# Patient Record
Sex: Female | Born: 1945
Health system: Southern US, Community
[De-identification: ages and names within clinical notes are randomized; demographics above are authoritative.]

## PROBLEM LIST (undated history)

## (undated) DIAGNOSIS — D649 Anemia, unspecified: Secondary | ICD-10-CM

## (undated) DIAGNOSIS — E785 Hyperlipidemia, unspecified: Secondary | ICD-10-CM

## (undated) DIAGNOSIS — Z5189 Encounter for other specified aftercare: Secondary | ICD-10-CM

## (undated) DIAGNOSIS — A809 Acute poliomyelitis, unspecified: Secondary | ICD-10-CM

## (undated) DIAGNOSIS — C801 Malignant (primary) neoplasm, unspecified: Secondary | ICD-10-CM

## (undated) DIAGNOSIS — J40 Bronchitis, not specified as acute or chronic: Secondary | ICD-10-CM

## (undated) DIAGNOSIS — H269 Unspecified cataract: Secondary | ICD-10-CM

## (undated) DIAGNOSIS — I10 Essential (primary) hypertension: Secondary | ICD-10-CM

## (undated) DIAGNOSIS — IMO0001 Reserved for inherently not codable concepts without codable children: Secondary | ICD-10-CM

## (undated) DIAGNOSIS — I639 Cerebral infarction, unspecified: Secondary | ICD-10-CM

## (undated) DIAGNOSIS — Z87442 Personal history of urinary calculi: Secondary | ICD-10-CM

## (undated) DIAGNOSIS — I729 Aneurysm of unspecified site: Secondary | ICD-10-CM

## (undated) DIAGNOSIS — E7800 Pure hypercholesterolemia, unspecified: Secondary | ICD-10-CM

## (undated) DIAGNOSIS — N181 Chronic kidney disease, stage 1: Secondary | ICD-10-CM

## (undated) DIAGNOSIS — R42 Dizziness and giddiness: Secondary | ICD-10-CM

## (undated) DIAGNOSIS — Z8719 Personal history of other diseases of the digestive system: Secondary | ICD-10-CM

## (undated) DIAGNOSIS — E119 Type 2 diabetes mellitus without complications: Secondary | ICD-10-CM

## (undated) DIAGNOSIS — R0600 Dyspnea, unspecified: Secondary | ICD-10-CM

## (undated) DIAGNOSIS — I499 Cardiac arrhythmia, unspecified: Secondary | ICD-10-CM

## (undated) DIAGNOSIS — K219 Gastro-esophageal reflux disease without esophagitis: Secondary | ICD-10-CM

## (undated) HISTORY — PX: CORONARY ANGIOPLASTY: SHX604

## (undated) HISTORY — PX: CAROTID ENDARTERECTOMY: SUR193

## (undated) HISTORY — DX: Type 2 diabetes mellitus without complications: E11.9

## (undated) HISTORY — PX: DILATION AND CURETTAGE OF UTERUS: SHX78

## (undated) HISTORY — DX: Unspecified cataract: H26.9

## (undated) HISTORY — DX: Aneurysm of unspecified site: I72.9

---
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
## 1983-04-15 DIAGNOSIS — C801 Malignant (primary) neoplasm, unspecified: Secondary | ICD-10-CM

## 1983-04-15 HISTORY — DX: Malignant (primary) neoplasm, unspecified: C80.1

## 1983-04-15 HISTORY — PX: CERVICAL CONIZATION W/BX: SHX1330

## 1998-08-07 ENCOUNTER — Ambulatory Visit (HOSPITAL_COMMUNITY): Admission: RE | Admit: 1998-08-07 | Discharge: 1998-08-07 | Payer: Self-pay | Admitting: *Deleted

## 1998-08-07 ENCOUNTER — Encounter: Payer: Self-pay | Admitting: *Deleted

## 1998-08-21 ENCOUNTER — Encounter: Payer: Self-pay | Admitting: *Deleted

## 1998-08-24 ENCOUNTER — Inpatient Hospital Stay (HOSPITAL_COMMUNITY): Admission: RE | Admit: 1998-08-24 | Discharge: 1998-08-27 | Payer: Self-pay | Admitting: *Deleted

## 2005-05-03 ENCOUNTER — Inpatient Hospital Stay (HOSPITAL_COMMUNITY): Admission: EM | Admit: 2005-05-03 | Discharge: 2005-05-08 | Payer: Self-pay | Admitting: Emergency Medicine

## 2005-06-23 ENCOUNTER — Ambulatory Visit (HOSPITAL_COMMUNITY): Admission: RE | Admit: 2005-06-23 | Discharge: 2005-06-23 | Payer: Self-pay | Admitting: Interventional Radiology

## 2005-08-27 ENCOUNTER — Encounter: Payer: Self-pay | Admitting: Interventional Radiology

## 2005-10-22 ENCOUNTER — Inpatient Hospital Stay (HOSPITAL_COMMUNITY): Admission: RE | Admit: 2005-10-22 | Discharge: 2005-10-23 | Payer: Self-pay | Admitting: Interventional Radiology

## 2005-11-03 ENCOUNTER — Encounter: Admission: RE | Admit: 2005-11-03 | Discharge: 2005-11-03 | Payer: Self-pay | Admitting: Interventional Radiology

## 2005-11-05 ENCOUNTER — Encounter: Payer: Self-pay | Admitting: Interventional Radiology

## 2008-04-14 HISTORY — PX: COLONOSCOPY: SHX5424

## 2011-02-19 ENCOUNTER — Emergency Department (HOSPITAL_COMMUNITY): Payer: Medicare Other

## 2011-02-19 ENCOUNTER — Inpatient Hospital Stay (HOSPITAL_COMMUNITY)
Admission: EM | Admit: 2011-02-19 | Discharge: 2011-02-21 | DRG: 069 | Disposition: A | Discharge status: Home / Self Care | Payer: Medicare Other | Source: Ambulatory Visit | Attending: Family Medicine | Admitting: Family Medicine

## 2011-02-19 ENCOUNTER — Other Ambulatory Visit: Payer: Self-pay

## 2011-02-19 DIAGNOSIS — Z8673 Personal history of transient ischemic attack (TIA), and cerebral infarction without residual deficits: Secondary | ICD-10-CM

## 2011-02-19 DIAGNOSIS — J40 Bronchitis, not specified as acute or chronic: Secondary | ICD-10-CM

## 2011-02-19 DIAGNOSIS — D72829 Elevated white blood cell count, unspecified: Secondary | ICD-10-CM | POA: Diagnosis present

## 2011-02-19 DIAGNOSIS — F17200 Nicotine dependence, unspecified, uncomplicated: Secondary | ICD-10-CM | POA: Diagnosis present

## 2011-02-19 DIAGNOSIS — N058 Unspecified nephritic syndrome with other morphologic changes: Secondary | ICD-10-CM | POA: Diagnosis present

## 2011-02-19 DIAGNOSIS — Z9861 Coronary angioplasty status: Secondary | ICD-10-CM

## 2011-02-19 DIAGNOSIS — R29898 Other symptoms and signs involving the musculoskeletal system: Secondary | ICD-10-CM | POA: Diagnosis present

## 2011-02-19 DIAGNOSIS — B91 Sequelae of poliomyelitis: Secondary | ICD-10-CM

## 2011-02-19 DIAGNOSIS — M216X9 Other acquired deformities of unspecified foot: Secondary | ICD-10-CM | POA: Diagnosis present

## 2011-02-19 DIAGNOSIS — G459 Transient cerebral ischemic attack, unspecified: Principal | ICD-10-CM | POA: Diagnosis present

## 2011-02-19 DIAGNOSIS — E1129 Type 2 diabetes mellitus with other diabetic kidney complication: Secondary | ICD-10-CM | POA: Diagnosis present

## 2011-02-19 DIAGNOSIS — N390 Urinary tract infection, site not specified: Secondary | ICD-10-CM | POA: Diagnosis present

## 2011-02-19 DIAGNOSIS — Z720 Tobacco use: Secondary | ICD-10-CM | POA: Diagnosis present

## 2011-02-19 DIAGNOSIS — K219 Gastro-esophageal reflux disease without esophagitis: Secondary | ICD-10-CM | POA: Diagnosis present

## 2011-02-19 DIAGNOSIS — E876 Hypokalemia: Secondary | ICD-10-CM | POA: Diagnosis present

## 2011-02-19 DIAGNOSIS — I129 Hypertensive chronic kidney disease with stage 1 through stage 4 chronic kidney disease, or unspecified chronic kidney disease: Secondary | ICD-10-CM | POA: Diagnosis present

## 2011-02-19 DIAGNOSIS — I10 Essential (primary) hypertension: Secondary | ICD-10-CM | POA: Diagnosis present

## 2011-02-19 DIAGNOSIS — N181 Chronic kidney disease, stage 1: Secondary | ICD-10-CM | POA: Diagnosis present

## 2011-02-19 HISTORY — DX: Essential (primary) hypertension: I10

## 2011-02-19 HISTORY — DX: Reserved for inherently not codable concepts without codable children: IMO0001

## 2011-02-19 HISTORY — DX: Pure hypercholesterolemia, unspecified: E78.00

## 2011-02-19 HISTORY — DX: Gastro-esophageal reflux disease without esophagitis: K21.9

## 2011-02-19 HISTORY — DX: Encounter for other specified aftercare: Z51.89

## 2011-02-19 HISTORY — DX: Hyperlipidemia, unspecified: E78.5

## 2011-02-19 HISTORY — DX: Bronchitis, not specified as acute or chronic: J40

## 2011-02-19 HISTORY — DX: Malignant (primary) neoplasm, unspecified: C80.1

## 2011-02-19 HISTORY — DX: Cerebral infarction, unspecified: I63.9

## 2011-02-19 HISTORY — DX: Acute poliomyelitis, unspecified: A80.9

## 2011-02-19 HISTORY — DX: Anemia, unspecified: D64.9

## 2011-02-19 HISTORY — DX: Chronic kidney disease, stage 1: N18.1

## 2011-02-19 HISTORY — DX: Dizziness and giddiness: R42

## 2011-02-19 LAB — URINALYSIS, ROUTINE W REFLEX MICROSCOPIC
Glucose, UA: NEGATIVE mg/dL
Nitrite: NEGATIVE
Protein, ur: NEGATIVE mg/dL
Urobilinogen, UA: 0.2 mg/dL (ref 0.0–1.0)

## 2011-02-19 LAB — DIFFERENTIAL
Eosinophils Absolute: 0.2 10*3/uL (ref 0.0–0.7)
Lymphocytes Relative: 22 % (ref 12–46)
Lymphs Abs: 3 10*3/uL (ref 0.7–4.0)
Monocytes Absolute: 0.8 10*3/uL (ref 0.1–1.0)
Monocytes Relative: 6 % (ref 3–12)
Neutro Abs: 9.2 10*3/uL — ABNORMAL HIGH (ref 1.7–7.7)
Neutrophils Relative %: 70 % (ref 43–77)

## 2011-02-19 LAB — PROTIME-INR: INR: 0.99 (ref 0.00–1.49)

## 2011-02-19 LAB — BASIC METABOLIC PANEL
BUN: 16 mg/dL (ref 6–23)
CO2: 25 mEq/L (ref 19–32)
Chloride: 102 mEq/L (ref 96–112)
GFR calc Af Amer: 60 mL/min — ABNORMAL LOW (ref >90)
Glucose, Bld: 122 mg/dL — ABNORMAL HIGH (ref 70–99)
Sodium: 140 mEq/L (ref 135–145)

## 2011-02-19 LAB — URINE MICROSCOPIC-ADD ON

## 2011-02-19 LAB — CBC
MCHC: 34.1 g/dL (ref 30.0–36.0)
Platelets: 294 10*3/uL (ref 150–400)

## 2011-02-19 LAB — CARDIAC PANEL(CRET KIN+CKTOT+MB+TROPI)
CK, MB: 2.4 ng/mL (ref 0.3–4.0)
Total CK: 104 U/L (ref 7–177)
Troponin I: <0.3 ng/mL (ref <0.30)

## 2011-02-19 MED ORDER — ACETAMINOPHEN 650 MG RE SUPP: 650.0000 mg | RECTAL | Status: DC | PRN

## 2011-02-19 MED ORDER — CIPROFLOXACIN HCL 250 MG PO TABS
250.0000 mg | ORAL_TABLET | Freq: Two times a day (BID) | ORAL | Status: DC
  Administered 2011-02-20 – 2011-02-21 (×4): 250 mg via ORAL
  Filled 2011-02-19 (×7): qty 1

## 2011-02-19 MED ORDER — FAMOTIDINE 40 MG PO TABS
40.0000 mg | ORAL_TABLET | Freq: Two times a day (BID) | ORAL | Status: DC
  Administered 2011-02-20 – 2011-02-21 (×3): 40 mg via ORAL
  Filled 2011-02-19 (×5): qty 1

## 2011-02-19 MED ORDER — ALBUTEROL SULFATE (5 MG/ML) 0.5% IN NEBU
2.5000 mg | INHALATION_SOLUTION | RESPIRATORY_TRACT | Status: DC
  Filled 2011-02-19: qty 0.5

## 2011-02-19 MED ORDER — INSULIN ASPART 100 UNIT/ML ~~LOC~~ SOLN
0.0000 [IU] | Freq: Three times a day (TID) | SUBCUTANEOUS | Status: DC
  Filled 2011-02-19: qty 3

## 2011-02-19 MED ORDER — ALBUTEROL SULFATE (5 MG/ML) 0.5% IN NEBU
2.5000 mg | INHALATION_SOLUTION | RESPIRATORY_TRACT | Status: DC | PRN
  Administered 2011-02-20: 2.5 mg via RESPIRATORY_TRACT
  Filled 2011-02-19: qty 0.5

## 2011-02-19 MED ORDER — SODIUM CHLORIDE 0.9 % IV SOLN: 250.0000 mL | INTRAVENOUS | Status: DC

## 2011-02-19 MED ORDER — SODIUM CHLORIDE 0.9 % IJ SOLN
3.0000 mL | Freq: Two times a day (BID) | INTRAMUSCULAR | Status: DC
  Administered 2011-02-20 (×2): 3 mL via INTRAVENOUS

## 2011-02-19 MED ORDER — ASPIRIN 300 MG RE SUPP
300.0000 mg | Freq: Every day | RECTAL | Status: DC
Start: 2011-02-20 — End: 2011-02-20
  Filled 2011-02-19: qty 1

## 2011-02-19 MED ORDER — SIMVASTATIN 20 MG PO TABS
20.0000 mg | ORAL_TABLET | Freq: Every day | ORAL | Status: DC
  Administered 2011-02-20: 20 mg via ORAL
  Filled 2011-02-19 (×2): qty 1

## 2011-02-19 MED ORDER — GUAIFENESIN ER 600 MG PO TB12
600.0000 mg | ORAL_TABLET | Freq: Two times a day (BID) | ORAL | Status: DC
  Administered 2011-02-20 – 2011-02-21 (×4): 600 mg via ORAL
  Filled 2011-02-19 (×5): qty 1

## 2011-02-19 MED ORDER — ONDANSETRON HCL 4 MG/2ML IJ SOLN: 4.0000 mg | Freq: Four times a day (QID) | INTRAMUSCULAR | Status: DC | PRN

## 2011-02-19 MED ORDER — IPRATROPIUM BROMIDE 0.02 % IN SOLN
0.5000 mg | RESPIRATORY_TRACT | Status: AC
  Administered 2011-02-19: 0.5 mg via RESPIRATORY_TRACT

## 2011-02-19 MED ORDER — HYDROCHLOROTHIAZIDE 25 MG PO TABS
25.0000 mg | ORAL_TABLET | Freq: Every day | ORAL | Status: DC
  Administered 2011-02-20: 25 mg via ORAL
  Filled 2011-02-19: qty 1

## 2011-02-19 MED ORDER — ASPIRIN 325 MG PO TABS
325.0000 mg | ORAL_TABLET | Freq: Every day | ORAL | Status: DC
  Filled 2011-02-19: qty 1

## 2011-02-19 MED ORDER — POTASSIUM CHLORIDE 20 MEQ/15ML (10%) PO LIQD
40.0000 meq | Freq: Once | ORAL | Status: AC
  Administered 2011-02-19: 40 meq via ORAL
  Filled 2011-02-19: qty 30

## 2011-02-19 MED ORDER — ACETAMINOPHEN 325 MG PO TABS: 650.0000 mg | ORAL_TABLET | ORAL | Status: DC | PRN

## 2011-02-19 MED ORDER — SODIUM CHLORIDE 0.9 % IJ SOLN: 3.0000 mL | INTRAMUSCULAR | Status: DC | PRN

## 2011-02-19 MED ORDER — SENNOSIDES-DOCUSATE SODIUM 8.6-50 MG PO TABS
1.0000 | ORAL_TABLET | Freq: Every evening | ORAL | Status: DC | PRN
  Filled 2011-02-19: qty 1

## 2011-02-19 MED ORDER — IPRATROPIUM BROMIDE 0.02 % IN SOLN
0.5000 mg | RESPIRATORY_TRACT | Status: DC
  Filled 2011-02-19: qty 2.5

## 2011-02-19 MED ORDER — ALBUTEROL SULFATE (5 MG/ML) 0.5% IN NEBU
2.5000 mg | INHALATION_SOLUTION | RESPIRATORY_TRACT | Status: AC
  Administered 2011-02-19: 2.5 mg via RESPIRATORY_TRACT

## 2011-02-19 MED ORDER — DOXAZOSIN MESYLATE 8 MG PO TABS
4.0000 mg | ORAL_TABLET | Freq: Every day | ORAL | Status: DC
  Administered 2011-02-20: 4 mg via ORAL
  Filled 2011-02-19 (×2): qty 0.5

## 2011-02-19 MED ORDER — AMLODIPINE BESYLATE 5 MG PO TABS
5.0000 mg | ORAL_TABLET | Freq: Every day | ORAL | Status: DC
  Administered 2011-02-20: 5 mg via ORAL
  Filled 2011-02-19: qty 1

## 2011-02-19 MED ORDER — IPRATROPIUM BROMIDE 0.02 % IN SOLN
0.5000 mg | Freq: Four times a day (QID) | RESPIRATORY_TRACT | Status: DC
  Administered 2011-02-20 – 2011-02-21 (×7): 0.5 mg via RESPIRATORY_TRACT
  Filled 2011-02-19 (×15): qty 2.5

## 2011-02-19 NOTE — ED Notes (Signed)
TC to MRI DEPT . To report MD does want MRA done.

## 2011-02-19 NOTE — H&P (Signed)
PCP:   Particia Nearing From [facility]  Neurologist: Jannifer Franklin  Chief Complaint:  R hand not working  HPI: Paula Watson is a 65 y.o. female with the history of: Patient Active Problem List  Diagnoses  . CVA  . HTN (hypertension), benign  . DM (diabetes mellitus) type II controlled with renal manifestation    Presented with sudden onset of right hand weakness starting and 9:30 AM. Patient was trying to open a door and realized that she was unable to do so. She denied any change in sensation of her right hand. She informed her family who called 911. EMT checked her blood pressure and it was noted to be high at 230/100. By the time patient presented to emergency department his symptoms have completely resolved and currently she is back to her neurological baseline.  Of note in the emergency department she did receive a CT scan of the head that was unremarkable and also have gone down for an MRI.  Of note the past she had had a history of CVA in 2007 also affected her right side and was associated and mild vertigo. She also has a history of poor new myelitis affecting her right leg and has a chronic right foot drop. Of note since her last CVA she had near complete recovery is no significant residual deficits. She has been followed by Dr. Jannifer Franklin and neurology. She was taken off of her Plavix and currently taking baby aspirin twice a day.   Review of Systems:  Recently she has experienced some cold symptoms and today she also noticed double vision. She endorses some weight loss but she was trying to be on a diet. Pertinent negative: denies any chest pains or shortness of breath she hadn't had any diarrhea nausea vomiting or constipation she hasn't had any fevers. She did not endorse having fever, but did have chills with her cold. Otherwise review of systems is negative except for above. 10 systems were reviewed.  Past Medical History: Past Medical History  Diagnosis Date  .  Hypertension   . Diabetes mellitus   . High cholesterol   . GERD (gastroesophageal reflux disease)   . Stroke 2007 affected R side  . Vertigo now improved  . CKD (chronic kidney disease) stage 1, GFR 90 ml/min or greater was told by her PCP    Intolerant to ACE according to her PCP  . Poliomyelitis since 1948    has drop foot on R   Past Surgical History  Procedure Date  . Carotid endarterectomy 2000 L side  . Coronary angioplasty stented 2007     Medications: Prior to Admission medications   Medication Sig Start Date End Date Taking? Authorizing Provider  amLODipine (NORVASC) 5 MG tablet Take 5 mg by mouth daily.     Yes Historical Provider, MD  aspirin EC 81 MG tablet Take 81 mg by mouth 2 (two) times daily.    Yes Historical Provider, MD  dextromethorphan-guaiFENesin (MUCINEX DM) 30-600 MG per 12 hr tablet Take 1 tablet by mouth every 12 (twelve) hours.     Yes Historical Provider, MD  doxazosin (CARDURA) 4 MG tablet Take 4 mg by mouth at bedtime.     Yes Historical Provider, MD  famotidine (PEPCID) 20 MG tablet Take 40 mg by mouth 2 (two) times daily.     Yes Historical Provider, MD  hydrochlorothiazide (HYDRODIURIL) 25 MG tablet Take 25 mg by mouth daily.     Yes Historical Provider, MD  metFORMIN (  GLUCOPHAGE-XR) 500 MG 24 hr tablet Take 500 mg by mouth 2 (two) times daily.     Yes Historical Provider, MD  pravastatin (PRAVACHOL) 40 MG tablet Take 40 mg by mouth daily.     Yes Historical Provider, MD    Allergies:   Allergies  Allergen Reactions  . Penicillins Hives and Swelling    Social History:  reports that she has been smoking Cigarettes.  She has a 10 pack-year smoking history. She does not have any smokeless tobacco history on file. She reports that she does not drink alcohol or use illicit drugs.   Family History: family history includes Brain cancer in her father; Cancer in her father and sister; Coronary artery disease in her brother and mother; and Diabetes  type II in her father.  Family History  Problem Relation Age of Onset  . Coronary artery disease Mother   . Diabetes type II Father   . Cancer Father   . Brain cancer Father   . Cancer Sister   . Coronary artery disease Brother     Physical Exam: Patient Vitals for the past 24 hrs:  BP Temp Temp src Pulse Resp SpO2 Height Weight  02/19/11 1910 - - - - - - 5\' 4"  (1.626 m) 68.04 kg (150 lb)  02/19/11 1700 130/79 mmHg 97.5 F (36.4 C) Oral 79  19  98 % - -  02/19/11 1421 149/77 mmHg - - 71  18  95 % - -  02/19/11 1303 - 98.3 F (36.8 C) - - - - - -  02/19/11 1221 125/60 mmHg 97.6 F (36.4 C) Oral 78  16  98 % - -   Patient appears to be in no acute distress head nontraumatic moist mucous membranes  lungs: With mild crackles at the bases and mild wheezes bilaterally Heart regular rhythm no murmurs appreciated Abdomen soft nontender nondistended  extremities no clubbing cyanosis or edema right lower extremity in a brace neurological exam: Strength 5 out of 5 in both upper extremities low extremities patient is able to raise both legs but does have a chronic foot drop on her right foot. Cranial nerves 2-12 appear to be intact but I do appreciate of right sided nystagmus. Skin clean dry and intact no rashes present  Body mass index is 25.75 kg/(m^2).   Labs on Admission:   [facility] 02/19/11 1315  NA 140  K 3.1*  CL 102  CO2 25  GLUCOSE 122*  BUN 16  CREATININE 1.09  CALCIUM 9.6  MG --  PHOS --   No results found for this basename: AST:2,ALT:2,ALKPHOS:2,BILITOT:2,PROT:2,ALBUMIN:2 in the last 72 hours No results found for this basename: LIPASE:2,AMYLASE:2 in the last 72 hours  Basename 02/19/11 1315  WBC 13.2*  NEUTROABS 9.2*  HGB 13.7  HCT 40.2  MCV 81.9  PLT 294    Basename 02/19/11 1312  CKTOTAL 104  CKMB 2.4  CKMBINDEX --  TROPONINI <0.30   No results found for this basename: TSH,T4TOTAL,FREET3,T3FREE,THYROIDAB in the last 72 hours No results found for  this basename: VITAMINB12:2,FOLATE:2,FERRITIN:2,TIBC:2,IRON:2,RETICCTPCT:2 in the last 72 hours No results found for this basename: HGBA1C       Other results: Orders placed during the hospital encounter of 02/19/11  . ED EKG Showed no evidence of ischemia or infarction no significant changes from prior       Estimated Creatinine Clearance: 48.7 ml/min (by C-G formula based on Cr of 1.09). Blood Culture No results found for this basename: sdes, specrequest, cult, reptstatus  Radiological Exams on Admission: Dg Chest 2 View  02/19/2011  *RADIOLOGY REPORT*  Clinical Data: Wheezing, headache  CHEST - 2 VIEW  Comparison: 10/22/2005  Findings: Atheromatous aortic arch. Lungs clear.  Heart size and pulmonary vascularity normal.  No effusion.  Visualized bones unremarkable.  Vascular clips at the thoracic inlet on the left.  IMPRESSION: No acute disease  Original Report Authenticated By: Trecia Rogers, M.D.   Ct Head Wo Contrast  02/19/2011  *RADIOLOGY REPORT*  Clinical Data: Hypertension, visual disturbances  CT HEAD WITHOUT CONTRAST  Technique:  Contiguous axial images were obtained from the base of the skull through the vertex without contrast.  Comparison: MR 05/11/2005  Findings: Atherosclerotic and physiologic intracranial calcifications. There is no evidence of acute intracranial hemorrhage, brain edema, mass lesion, acute infarction,   mass effect, or midline shift. Acute infarct may be inapparent on noncontrast CT.  No other intra-axial abnormalities are seen, and the ventricles and sulci are within normal limits in size and symmetry.   No abnormal extra-axial fluid collections or masses are identified.  No significant calvarial abnormality.  IMPRESSION: 1. Negative for bleed or other acute intracranial process.  Original Report Authenticated By: Trecia Rogers, M.D.    Assessment/Plan Present on Admission:  .TIA (transient ischemic attack) .Weakness of right  hand .HTN (hypertension), benign .DM (diabetes mellitus) type II controlled with renal manifestation .Hypokalemia .Leukocytosis .UTI (lower urinary tract infection) .Tobacco abuse  PLAN:  #1 TIA - no admit patient per TIA protocol, she already had her MRI of the final results we'll make sure she also has MRA carotid Dopplers echo. We'll obtain cardiac markers. We'll monitor patient on telemetry. Would benefit from urology consult to discuss patient CYA her Plavix was discontinued and whether she needs to go back on it. For now will continue aspirin increase it to 325. PT/OT consult #2 number next weakness of right hand - currently resolved.  #3 double vision - mildly persistent pain there is out of MRI to see if it has been CVA or not #4 hypertension - this is currently under better control we'll continue home dose medications #5 diabetes mellitus-  Will put patient on sliding scale and hold her metformin #6  hypokalemia - already received 40 mEq of potassium in emergency department will also check magnesium and repeat as needed # 7 leukocytosis space likely secondary to UTI #8 UTI - ordered urine culture patient is allergic to penicillin will start on ciprofloxacin #9 Tobacco abuse - ordered tobacco cessation consult  prophylactic: SCD  CODE STATUS: Patient wishes to BE full code at this point states " this is just for my kids, try once." Carri Spillers 02/19/2011, 8:31 PM

## 2011-02-19 NOTE — ED Provider Notes (Signed)
History     CSN: [id number] Arrival date & time: 02/19/2011 12:19 PM   First MD Initiated Contact with Patient 02/19/11 1236      Chief Complaint  Patient presents with  . Hypertension     HPI  8yoF h/o CVA pw R hand numbness. Pt states that just pta she experienced 20 min sudden onset hand numbness Rt hand. She was having difficulty grasping with her Rt hand as well. Denies parasthesias. Denies arm involvement. Denies injury/pain to UE. States that sx went away completely and she is feeling back to baseline at this time. Denies headache, no BHT. She did have a mild headache subsequent to symptoms as above. Denies dizziness/cp/sob/palpitations.  She does c/o productive cough x 1 week. No fever, chills. She is a smoker. Denies h/o emphysema/COPD.  SBP >200 per EMS. Not given any antihypertensives prior to arrival by EMS but states that she did take her home dose of medications prior to their arrival  Compliant with plavix and asa   Past Medical History  Diagnosis Date  . Stroke   . Hypertension   . Diabetes mellitus   . High cholesterol   . GERD (gastroesophageal reflux disease)     Past Surgical History  Procedure Date  . Carotid endarterectomy   . Coronary angioplasty with stent placement     History reviewed. No pertinent family history.  History  Substance Use Topics  . Smoking status: Current Everyday Smoker    Types: Cigarettes  . Smokeless tobacco: Not on file  . Alcohol Use: No    OB History    Grav Para Term Preterm Abortions TAB SAB Ect Mult Living                  Review of Systems  All other systems reviewed and are negative.   except as noted HPI   Allergies  Penicillins  Home Medications   Current Outpatient Rx  Name Route Sig Dispense Refill  . AMLODIPINE BESYLATE 5 MG PO TABS Oral Take 5 mg by mouth daily.      . ASPIRIN EC 81 MG PO TBEC Oral Take 81 mg by mouth daily.      Marland Kitchen DEXTROMETHORPHAN-GUAIFENESIN 30-600 MG PO TB12 Oral Take  1 tablet by mouth every 12 (twelve) hours.      Marland Kitchen DOXAZOSIN MESYLATE 4 MG PO TABS Oral Take 4 mg by mouth at bedtime.      Marland Kitchen FAMOTIDINE 20 MG PO TABS Oral Take 40 mg by mouth 2 (two) times daily.      Marland Kitchen HYDROCHLOROTHIAZIDE 25 MG PO TABS Oral Take 25 mg by mouth daily.      Marland Kitchen METFORMIN HCL ER 500 MG PO TB24 Oral Take 500 mg by mouth 2 (two) times daily.      Marland Kitchen PRAVASTATIN SODIUM 40 MG PO TABS Oral Take 40 mg by mouth daily.        BP 149/77  Pulse 71  Temp(Src) 98.3 F (36.8 C) (Oral)  Resp 18  SpO2 95%  Physical Exam  Nursing note and vitals reviewed. Constitutional: She is oriented to person, place, and time. She appears well-developed.  HENT:  Head: Atraumatic.  Mouth/Throat: Oropharynx is clear and moist.  Eyes: Conjunctivae and EOM are normal. Pupils are equal, round, and reactive to light.  Neck: Normal range of motion. Neck supple.  Cardiovascular: Normal rate, regular rhythm, normal heart sounds and intact distal pulses.   Pulmonary/Chest: Effort normal. No respiratory distress. She has wheezes.  She has no rales.       Mild diffuse exp wheeze  Abdominal: Soft. She exhibits no distension. There is no tenderness. There is no rebound and no guarding.  Musculoskeletal: Normal range of motion.  Neurological: She is alert and oriented to person, place, and time. No cranial nerve deficit. Coordination normal.       Strength 5/5 all extremities No pronator drift No facial droop   Skin: Skin is warm and dry. No rash noted.  Psychiatric: She has a normal mood and affect.    Date: 02/19/2011  Rate: 67  Rhythm: normal sinus rhythm  QRS Axis: normal  Intervals: normal  ST/T Wave abnormalities: nonspecific T wave changes  Conduction Disutrbances:none  Narrative Interpretation:      ED Course  Procedures (including critical care time)  Labs Reviewed  CBC - Abnormal; Notable for the following:    WBC 13.2 (*)    All other components within normal limits  DIFFERENTIAL -  Abnormal; Notable for the following:    Neutro Abs 9.2 (*)    All other components within normal limits  BASIC METABOLIC PANEL - Abnormal; Notable for the following:    Potassium 3.1 (*)    Glucose, Bld 122 (*)    GFR calc non Af Amer 52 (*)    GFR calc Af Amer 60 (*)    All other components within normal limits  URINALYSIS, ROUTINE W REFLEX MICROSCOPIC - Abnormal; Notable for the following:    Leukocytes, UA SMALL (*)    All other components within normal limits  URINE MICROSCOPIC-ADD ON - Abnormal; Notable for the following:    Squamous Epithelial / LPF FEW (*)    Bacteria, UA FEW (*)    All other components within normal limits  PROTIME-INR  APTT  CARDIAC PANEL(CRET KIN+CKTOT+MB+TROPI)   Dg Chest 2 View  02/19/2011  *RADIOLOGY REPORT*  Clinical Data: Wheezing, headache  CHEST - 2 VIEW  Comparison: 10/22/2005  Findings: Atheromatous aortic arch. Lungs clear.  Heart size and pulmonary vascularity normal.  No effusion.  Visualized bones unremarkable.  Vascular clips at the thoracic inlet on the left.  IMPRESSION: No acute disease  Original Report Authenticated By: Trecia Rogers, M.D.   Ct Head Wo Contrast  02/19/2011  *RADIOLOGY REPORT*  Clinical Data: Hypertension, visual disturbances  CT HEAD WITHOUT CONTRAST  Technique:  Contiguous axial images were obtained from the base of the skull through the vertex without contrast.  Comparison: MR 05/11/2005  Findings: Atherosclerotic and physiologic intracranial calcifications. There is no evidence of acute intracranial hemorrhage, brain edema, mass lesion, acute infarction,   mass effect, or midline shift. Acute infarct may be inapparent on noncontrast CT.  No other intra-axial abnormalities are seen, and the ventricles and sulci are within normal limits in size and symmetry.   No abnormal extra-axial fluid collections or masses are identified.  No significant calvarial abnormality.  IMPRESSION: 1. Negative for bleed or other acute  intracranial process.  Original Report Authenticated By: Dillard Cannon III, M.D.     1. TIA (transient ischemic attack)   2. Bronchitis       MDM  R hand numbness and difficulty with proprioception, resolved. CT head negative. Labs unremarkable except mild hypokalemia, repleted. Pt asx at this time. MRI brain pending, neurology to see in ER. She does not meet qualification for TIA protocol given prior TIA/CVA. Dr. Paulino Rily of neurology to see.  The patient is a chronic smoker and likely has early  emphysema. Wheezing is resolved after duoneb. CXR without infiltrate. If she is discharge home, recommend abx and inhaler. PMD f/u  Discussed with CDU PA Paz. She is aware of the plan.  Hendricks Milo, MD         Blair Heys, MD 02/19/11 [phone]

## 2011-02-19 NOTE — Consult Note (Signed)
Reason for Consult:TIA   CC: TIA  HPI: Paula Watson is an 65 y.o. female who awoke this AM at her baseline.  At approximately 7:30 when trying to let her cat out she noted Right arm clumsiness and Right arm numbness.  This lasted for about 20 minutes than resolved.  Patient has history of Right posterior circulation stroke, polio with Right LE weakness, Vertigo, hypertension, hyperlipidemia, Left CEA.  Symptoms now resolved   Past Medical History  Diagnosis Date  . Stroke   . Hypertension   . Diabetes mellitus   . High cholesterol   . GERD (gastroesophageal reflux disease)     Past Surgical History  Procedure Date  . Carotid endarterectomy   . Coronary angioplasty with stent placement     History reviewed. No pertinent family history.  Social History:  reports that she has been smoking Cigarettes.  She does not have any smokeless tobacco history on file. She reports that she does not drink alcohol or use illicit drugs.  Allergies  Allergen Reactions  . Penicillins Hives and Swelling    Medications:  Scheduled:   . ipratropium  0.5 mg Nebulization NOW   And  . albuterol  2.5 mg Nebulization NOW  . potassium chloride  40 mEq Oral Once  . DISCONTD: albuterol  2.5 mg Nebulization Q4H  . DISCONTD: ipratropium  0.5 mg Nebulization Q4H    Review of Systems - General ROS: negative for - chills, fatigue, fever or hot flashes Hematological and Lymphatic ROS: negative for - bruising, fatigue, jaundice or pallor Endocrine ROS: negative for - hair pattern changes, hot flashes, mood swings or skin changes Respiratory ROS: negative for - cough, hemoptysis, orthopnea or wheezing Cardiovascular ROS: negative for - dyspnea on exertion, orthopnea, palpitations or shortness of breath Gastrointestinal ROS: negative for - abdominal pain, appetite loss, blood in stools, diarrhea or hematemesis Musculoskeletal ROS: negative for - joint pain, joint stiffness, joint swelling or muscle  pain Neurological ROS: negative for - confusion, dizziness, headaches or seizures Dermatological ROS: negative for dry skin, pruritus and rash   Blood pressure 149/77, pulse 71, temperature 98.3 F (36.8 C), temperature source Oral, resp. rate 18, SpO2 95.00%.  Neurologic Examination: Mental Status: Alert, oriented, thought content appropriate.  Speech fluent without evidence of aphasia. Able to follow 3 step commands without difficulty. Cranial Nerves: II-Discs flat bilaterally. Visual fields grossly intact. III/IV/VI-Extraocular movements intact.  Pupils reactive bilaterally. V/VII-Smile symmetric VIII-grossly intact IX/X-normal gag XI-bilateral shoulder shrug XII-midline tongue extension Motor: 5/5 bilaterally with normal tone and bulk except right LE foot drop with AFO secondary to polio.   Sensory: Pinprick and light touch intact throughout, bilaterally Deep Tendon Reflexes: 2+ and symmetric throughout Plantars: Downgoing bilaterally Cerebellar: slight clumsy bilateral  finger-to-nose, slow alternating movements and normal heel-to-shin test.       Results for orders placed during the hospital encounter of 02/19/11 (from the past 48 hour(s))  CARDIAC PANEL(CRET KIN+CKTOT+MB+TROPI)     Status: Normal   Collection Time   02/19/11  1:12 PM      Component Value Range Comment   Total CK 104  7 - 177 (U/L)    CK, MB 2.4  0.3 - 4.0 (ng/mL)    Troponin I <0.30  <0.30 (ng/mL)    Relative Index 2.3  0.0 - 2.5    CBC     Status: Abnormal   Collection Time   02/19/11  1:15 PM      Component Value Range Comment  WBC 13.2 (*) 4.0 - 10.5 (K/uL)    RBC 4.91  3.87 - 5.11 (MIL/uL)    Hemoglobin 13.7  12.0 - 15.0 (g/dL)    HCT 40.2  36.0 - 46.0 (%)    MCV 81.9  78.0 - 100.0 (fL)    MCH 27.9  26.0 - 34.0 (pg)    MCHC 34.1  30.0 - 36.0 (g/dL)    RDW 13.8  11.5 - 15.5 (%)    Platelets 294  150 - 400 (K/uL)   DIFFERENTIAL     Status: Abnormal   Collection Time   02/19/11  1:15 PM       Component Value Range Comment   Neutrophils Relative 70  43 - 77 (%)    Neutro Abs 9.2 (*) 1.7 - 7.7 (K/uL)    Lymphocytes Relative 22  12 - 46 (%)    Lymphs Abs 3.0  0.7 - 4.0 (K/uL)    Monocytes Relative 6  3 - 12 (%)    Monocytes Absolute 0.8  0.1 - 1.0 (K/uL)    Eosinophils Relative 2  0 - 5 (%)    Eosinophils Absolute 0.2  0.0 - 0.7 (K/uL)    Basophils Relative 0  0 - 1 (%)    Basophils Absolute 0.0  0.0 - 0.1 (K/uL)   BASIC METABOLIC PANEL     Status: Abnormal   Collection Time   02/19/11  1:15 PM      Component Value Range Comment   Sodium 140  135 - 145 (mEq/L)    Potassium 3.1 (*) 3.5 - 5.1 (mEq/L)    Chloride 102  96 - 112 (mEq/L)    CO2 25  19 - 32 (mEq/L)    Glucose, Bld 122 (*) 70 - 99 (mg/dL)    BUN 16  6 - 23 (mg/dL)    Creatinine, Ser 1.09  0.50 - 1.10 (mg/dL)    Calcium 9.6  8.4 - 10.5 (mg/dL)    GFR calc non Af Amer 52 (*) >90 (mL/min)    GFR calc Af Amer 60 (*) >90 (mL/min)   PROTIME-INR     Status: Normal   Collection Time   02/19/11  1:15 PM      Component Value Range Comment   Prothrombin Time 13.3  11.6 - 15.2 (seconds)    INR 0.99  0.00 - 1.49    APTT     Status: Normal   Collection Time   02/19/11  1:15 PM      Component Value Range Comment   aPTT 29  24 - 37 (seconds)   URINALYSIS, ROUTINE W REFLEX MICROSCOPIC     Status: Abnormal   Collection Time   02/19/11  1:46 PM      Component Value Range Comment   Color, Urine YELLOW  YELLOW     Appearance CLEAR  CLEAR     Specific Gravity, Urine 1.007  1.005 - 1.030     pH 6.5  5.0 - 8.0     Glucose, UA NEGATIVE  NEGATIVE (mg/dL)    Hgb urine dipstick NEGATIVE  NEGATIVE     Bilirubin Urine NEGATIVE  NEGATIVE     Ketones, ur NEGATIVE  NEGATIVE (mg/dL)    Protein, ur NEGATIVE  NEGATIVE (mg/dL)    Urobilinogen, UA 0.2  0.0 - 1.0 (mg/dL)    Nitrite NEGATIVE  NEGATIVE     Leukocytes, UA SMALL (*) NEGATIVE    URINE MICROSCOPIC-ADD ON     Status: Abnormal  Collection Time   02/19/11  1:46 PM      Component  Value Range Comment   Squamous Epithelial / LPF FEW (*) RARE     WBC, UA 11-20  <3 (WBC/hpf)    RBC / HPF 0-2  <3 (RBC/hpf)    Bacteria, UA FEW (*) RARE      Dg Chest 2 View  02/19/2011  *RADIOLOGY REPORT*  Clinical Data: Wheezing, headache  CHEST - 2 VIEW  Comparison: 10/22/2005  Findings: Atheromatous aortic arch. Lungs clear.  Heart size and pulmonary vascularity normal.  No effusion.  Visualized bones unremarkable.  Vascular clips at the thoracic inlet on the left.  IMPRESSION: No acute disease  Original Report Authenticated By: Trecia Rogers, M.D.   Ct Head Wo Contrast  02/19/2011  *RADIOLOGY REPORT*  Clinical Data: Hypertension, visual disturbances  CT HEAD WITHOUT CONTRAST  Technique:  Contiguous axial images were obtained from the base of the skull through the vertex without contrast.  Comparison: MR 05/11/2005  Findings: Atherosclerotic and physiologic intracranial calcifications. There is no evidence of acute intracranial hemorrhage, brain edema, mass lesion, acute infarction,   mass effect, or midline shift. Acute infarct may be inapparent on noncontrast CT.  No other intra-axial abnormalities are seen, and the ventricles and sulci are within normal limits in size and symmetry.   No abnormal extra-axial fluid collections or masses are identified.  No significant calvarial abnormality.  IMPRESSION: 1. Negative for bleed or other acute intracranial process.  Original Report Authenticated By: Trecia Rogers, M.D.     Assessment/Plan: 65 YO female with transient right arm weakness and clumsiness now resolved.  Patient has had right posterior CVA in 2007 with resulting Left CEA due to stenosis.  Patients symptoms have now fully resolved.    Recommend: (1) change ASA to plavix (2) MRI/ MRA head no contrast (3) MRA neck with contrast (4) stroke workup   Etta Quill PA-C [facility] [phone]  02/19/2011, 4:11 PM

## 2011-02-19 NOTE — ED Provider Notes (Signed)
4:15 PM  Dr. Vanita Panda will be admitting the patient for Dr. Justin Mend. This is based on neurology's recommendation due to the patient's significant stroke history.  [place], [place] 02/19/11 1721

## 2011-02-19 NOTE — ED Notes (Signed)
Pt also has c/o productive cough x 1 week.  Pt reports slight headache 3/10 at this time.

## 2011-02-19 NOTE — ED Notes (Signed)
Pt resting quietly at the time, denies pain. To be moved to yellow holding unit. Vital signs stable.

## 2011-02-19 NOTE — ED Notes (Signed)
sts that at 0915 this am went to [facility] for cat and couldn't find handle. bp elevated when ems evaluated.hx of stroke felt like previous stroke. sts symptoms subsided 15 mins ago and then followed by headache.

## 2011-02-19 NOTE — ED Notes (Signed)
Pt transported to MRI. Will return to room 21 on yellow holding unit, MRI notified of room change

## 2011-02-20 ENCOUNTER — Other Ambulatory Visit: Payer: Self-pay

## 2011-02-20 LAB — COMPREHENSIVE METABOLIC PANEL
ALT: 9 U/L (ref 0–35)
AST: 15 U/L (ref 0–37)
Alkaline Phosphatase: 76 U/L (ref 39–117)
CO2: 25 mEq/L (ref 19–32)
Calcium: 9.7 mg/dL (ref 8.4–10.5)
GFR calc Af Amer: 57 mL/min — ABNORMAL LOW (ref >90)
GFR calc non Af Amer: 49 mL/min — ABNORMAL LOW (ref >90)
Glucose, Bld: 103 mg/dL — ABNORMAL HIGH (ref 70–99)
Potassium: 3 mEq/L — ABNORMAL LOW (ref 3.5–5.1)
Sodium: 142 mEq/L (ref 135–145)
Total Protein: 6.6 g/dL (ref 6.0–8.3)

## 2011-02-20 LAB — LIPID PANEL
LDL Cholesterol: 90 mg/dL (ref 0–99)
Total CHOL/HDL Ratio: 3.5 RATIO
VLDL: 22 mg/dL (ref 0–40)

## 2011-02-20 LAB — CBC
Platelets: 274 10*3/uL (ref 150–400)
RBC: 4.77 MIL/uL (ref 3.87–5.11)
RDW: 13.9 % (ref 11.5–15.5)
WBC: 10.5 10*3/uL (ref 4.0–10.5)

## 2011-02-20 LAB — GLUCOSE, CAPILLARY
Glucose-Capillary: 112 mg/dL — ABNORMAL HIGH (ref 70–99)
Glucose-Capillary: 111 mg/dL — ABNORMAL HIGH (ref 70–99)
Glucose-Capillary: 98 mg/dL (ref 70–99)
Glucose-Capillary: 178 mg/dL — ABNORMAL HIGH (ref 70–99)

## 2011-02-20 LAB — DIFFERENTIAL
Basophils Absolute: 0 10*3/uL (ref 0.0–0.1)
Eosinophils Relative: 4 % (ref 0–5)
Lymphocytes Relative: 32 % (ref 12–46)
Lymphs Abs: 3.3 10*3/uL (ref 0.7–4.0)
Neutrophils Relative %: 56 % (ref 43–77)

## 2011-02-20 LAB — HEMOGLOBIN A1C
Hgb A1c MFr Bld: 6.5 % — ABNORMAL HIGH (ref <5.7)
Mean Plasma Glucose: 140 mg/dL — ABNORMAL HIGH (ref <117)

## 2011-02-20 MED ORDER — POTASSIUM CHLORIDE CRYS ER 20 MEQ PO TBCR
40.0000 meq | EXTENDED_RELEASE_TABLET | Freq: Two times a day (BID) | ORAL | Status: AC
  Administered 2011-02-20 (×2): 40 meq via ORAL
  Filled 2011-02-20 (×2): qty 2

## 2011-02-20 MED ORDER — HYDROCHLOROTHIAZIDE 25 MG PO TABS
12.5000 mg | ORAL_TABLET | Freq: Every day | ORAL | Status: DC
  Filled 2011-02-20 (×2): qty 0.5

## 2011-02-20 MED ORDER — LISINOPRIL 10 MG PO TABS
10.0000 mg | ORAL_TABLET | Freq: Every day | ORAL | Status: DC
  Filled 2011-02-20: qty 1

## 2011-02-20 MED ORDER — POTASSIUM CHLORIDE 10 MEQ/100ML IV SOLN
10.0000 meq | Freq: Once | INTRAVENOUS | Status: AC
  Administered 2011-02-20: 10 meq via INTRAVENOUS
  Filled 2011-02-20: qty 100

## 2011-02-20 MED ORDER — AMLODIPINE BESYLATE 10 MG PO TABS
10.0000 mg | ORAL_TABLET | Freq: Every day | ORAL | Status: DC
  Administered 2011-02-21: 10 mg via ORAL
  Filled 2011-02-20: qty 1

## 2011-02-20 MED ORDER — POTASSIUM CHLORIDE 20 MEQ PO PACK: 40.0000 meq | PACK | Freq: Two times a day (BID) | ORAL | Status: DC

## 2011-02-20 MED ORDER — CLOPIDOGREL BISULFATE 75 MG PO TABS
75.0000 mg | ORAL_TABLET | Freq: Every day | ORAL | Status: DC
  Administered 2011-02-21: 75 mg via ORAL
  Filled 2011-02-20: qty 1

## 2011-02-20 NOTE — Progress Notes (Signed)
Attempted to see pt. For eval, however, pt. Undergoing procedure.  Will re-attempt.

## 2011-02-20 NOTE — Progress Notes (Signed)
Physical Therapy Evaluation Patient Details Name: Paula Watson MRN: [id number] DOB: 10/05/1945 Today's Date: 02/20/2011  Problem List:  Patient Active Problem List  Diagnoses  . TIA (transient ischemic attack)  . HTN (hypertension), benign  . DM (diabetes mellitus) type II controlled with renal manifestation  . Hypokalemia  . Leukocytosis  . UTI (lower urinary tract infection)  . Tobacco abuse    Past Medical History:  Past Medical History  Diagnosis Date  . Hypertension   . Diabetes mellitus   . High cholesterol   . GERD (gastroesophageal reflux disease)   . Stroke 2007 affected R side  . Vertigo now improved  . Poliomyelitis since 1948    has drop foot on R  . Polio 1953  . Cancer 1985    cervical  . Coronary artery disease   . Bronchitis     "have had a couple of times in my lifetime"  . Hyperlipidemia   . CKD (chronic kidney disease) stage 1, GFR 90 ml/min or greater was told by her PCP    Intolerant to ACE according to her PCP  . Blood transfusion   . Anemia   . Hemorrhoids    Past Surgical History:  Past Surgical History  Procedure Date  . Carotid endarterectomy 2000 L side  . Coronary angioplasty stented 2007  . Tonsillectomy 1954  . Foot reconstructjion 1953    right foot; post polio  . Cervical conization w/bx 1985  . Dilation and curettage of uterus 1980's    PT Assessment/Plan/Recommendation PT Assessment Clinical Impression Statement: Pt appears to have no residual weakness from TIA.  No further acute PT needed PT Recommendation/Assessment: Patent does not need any further PT services No Skilled PT: Patient at baseline level of functioning;Patient is independent with all acitivity/mobility PT Goals  Acute Rehab PT Goals PT Goal Formulation: With patient  PT Evaluation Precautions/Restrictions    Prior Functioning  Home Living Lives With: Other (Comment) (sister) Type of Home: Mobile home Home Layout: One level Home Access: Stairs to  enter Entrance Stairs-Rails: None Entrance Stairs-Number of Steps: 1 Bathroom Shower/Tub: Gaffer;Door [employer] Toilet: Standard Bathroom Accessibility: Yes How Accessible: Accessible via walker Home Adaptive Equipment: Built-in shower seat Prior Function Level of Independence: Independent with basic ADLs;Independent with homemaking with ambulation;Independent with gait Able to Take Stairs?: Yes Driving: Yes Vocation: On disability Cognition Cognition Arousal/Alertness: Awake/alert Overall Cognitive Status: Appears within functional limits for tasks assessed Orientation Level: Oriented X4 Sensation/Coordination Sensation Light Touch: Appears Intact;Impaired Detail Light Touch Impaired Details:  (Rt foot chronic secondary to polio) Coordination Gross Motor Movements are Fluid and Coordinated: Yes (with exception of Rt foot/ankle ) Fine Motor Movements are Fluid and Coordinated: Yes Finger Nose Finger Test: WNL Extremity Assessment LUE Assessment LUE Assessment: Within Functional Limits RLE Assessment RLE Assessment: Exceptions to [facility] RLE Strength Right Ankle Dorsiflexion: 0/5 Right Ankle Plantar Flexion: 0/5 Right Ankle Inversion: 0/5 Right Ankle Eversion: 0/5 RLE Tone RLE Tone: Flaccid (foot and ankle secondary to polio) LLE Assessment LLE Assessment: Within Functional Limits Mobility (including Balance) Bed Mobility Bed Mobility: Yes Rolling Left: 7: Independent Left Sidelying to Sit: 7: Independent Supine to Sit: 7: Independent Sit to Supine - Left: 7: Independent Transfers Transfers: Yes Sit to Stand: 7: Independent Stand to Sit: 7: Independent Ambulation/Gait Ambulation/Gait: Yes Ambulation/Gait Assistance: 7: Independent Ambulation Distance (Feet): 300 Feet Assistive device: None Gait Pattern: Within Functional Limits (With AFO on Rt foot for foot drop) Stairs: Yes Stairs Assistance: 6: Modified independent (  Device/Increase time) Stair Management  Technique: Step to pattern;Forwards;Other (comment) (pulling on door jam as at home) Number of Stairs: 1   Balance Balance Assessed: Yes Static Sitting Balance Static Sitting - Balance Support: No upper extremity supported;Feet supported;Feet unsupported Static Sitting - Level of Assistance: 7: Independent Static Sitting - Comment/# of Minutes: >5 Dynamic Sitting Balance Dynamic Sitting - Balance Support: No upper extremity supported;Feet supported;During functional activity (Donning socks AFO and shoes sitting on EOB no back support) Dynamic Sitting - Level of Assistance: 7: Independent Reach (Patient is able to reach ___ inches to right, left, forward, back): >12 Dynamic Sitting - Balance Activities: Reaching for objects Static Standing Balance Static Standing - Balance Support: No upper extremity supported Static Standing - Level of Assistance: 7: Independent Static Standing - Comment/# of Minutes: >5 Rhomberg - Eyes Opened: 20  Dynamic Standing Balance Dynamic Standing - Balance Support: No upper extremity supported Dynamic Standing - Level of Assistance: 7: Independent Dynamic Standing - Balance Activities: Reaching for objects;Reaching across midline Dynamic Gait Index Level Surface: Normal Change in Gait Speed: Normal Gait with Horizontal Head Turns: Mild Impairment Gait with Vertical Head Turns: Normal Gait and Pivot Turn: Mild Impairment Step Over Obstacle: Mild Impairment Step Around Obstacles: Normal Steps: Normal Total Score: 21  Exercise    End of Session PT - End of Session Equipment Utilized During Treatment: Gait belt Activity Tolerance: Patient tolerated treatment well Patient left: in chair;with call bell in reach Nurse Communication: Mobility status for transfers;Mobility status for ambulation General Behavior During Session: [facility] for tasks performed Cognition: [facility] for tasks performed  Oluwasemilore Bahl 02/20/2011, 12:00 PM

## 2011-02-20 NOTE — Progress Notes (Addendum)
Subjective: No further symptoms.  Back to baseline.    Objective: Vital signs in last 24 hours: Temp:  [97.5 F (36.4 C)-98.7 F (37.1 C)] 98.6 F (37 C) (11/08 0900) Pulse Rate:  [65-84] 70  (11/08 0900) Resp:  [15-21] 16  (11/08 0900) BP: (124-185)/(55-79) 138/72 mmHg (11/08 0900) SpO2:  [93 %-98 %] 93 % (11/08 0900) Weight:  [68.04 kg (150 lb)-68.402 kg (150 lb 12.8 oz)] 150 lb 12.8 oz (68.402 kg) (11/07 2300)  Intake/Output from previous day: 11/07 0701 - 11/08 0700 In: 3 [I.V.:3] Out: -  Intake/Output this shift:   Nutritional status: Carb Control  Past Medical History  Diagnosis Date  . Hypertension   . Diabetes mellitus   . High cholesterol   . GERD (gastroesophageal reflux disease)   . Stroke 2007 affected R side  . Vertigo now improved  . Poliomyelitis since 1948    has drop foot on R  . Polio 1953  . Cancer 1985    cervical  . Coronary artery disease   . Bronchitis     "have had a couple of times in my lifetime"  . Hyperlipidemia   . CKD (chronic kidney disease) stage 1, GFR 90 ml/min or greater was told by her PCP    Intolerant to ACE according to her PCP  . Blood transfusion   . Anemia   . Hemorrhoids     Neurologic Exam: Mental Status:  Alert, oriented, thought content appropriate. Speech fluent without evidence of aphasia. Able to follow 3 step commands without difficulty.  Cranial Nerves:  II-Discs flat bilaterally. Visual fields grossly intact.  III/IV/VI-Extraocular movements intact. Pupils reactive bilaterally.  V/VII-Smile symmetric  VIII-grossly intact  IX/X-normal gag  XI-bilateral shoulder shrug  XII-midline tongue extension  Motor: 5/5 bilaterally with normal tone and bulk except right LE foot drop secondary to polio  Sensory: Pinprick and light touch intact throughout, bilaterally  Deep Tendon Reflexes: 2+ and symmetric throughout  Plantars: Downgoing bilaterally  Cerebellar:. F-N and H-S smooth  Lab Results:  Basename 02/20/11  0700 02/19/11 1315  WBC 10.5 13.2*  HGB 13.1 13.7  HCT 39.7 40.2  PLT 274 294  NA 142 140  K 3.0* 3.1*  CL 102 102  CO2 25 25  GLUCOSE 103* 122*  BUN 15 16  CREATININE 1.15* 1.09  CALCIUM 9.7 9.6  LABA1C -- --   Lipid Panel  Basename 02/20/11 0700  CHOL 157  TRIG 110  HDL 45  CHOLHDL 3.5  VLDL 22  LDLCALC 90    Studies/Results: Dg Chest 2 View  02/19/2011  *RADIOLOGY REPORT*  Clinical Data: Wheezing, headache  CHEST - 2 VIEW  Comparison: 10/22/2005  Findings: Atheromatous aortic arch. Lungs clear.  Heart size and pulmonary vascularity normal.  No effusion.  Visualized bones unremarkable.  Vascular clips at the thoracic inlet on the left.  IMPRESSION: No acute disease  Original Report Authenticated By: Trecia Rogers, M.D.   Ct Head Wo Contrast  02/19/2011  *RADIOLOGY REPORT*  Clinical Data: Hypertension, visual disturbances  CT HEAD WITHOUT CONTRAST  Technique:  Contiguous axial images were obtained from the base of the skull through the vertex without contrast.  Comparison: MR 05/11/2005  Findings: Atherosclerotic and physiologic intracranial calcifications. There is no evidence of acute intracranial hemorrhage, brain edema, mass lesion, acute infarction,   mass effect, or midline shift. Acute infarct may be inapparent on noncontrast CT.  No other intra-axial abnormalities are seen, and the ventricles and sulci are within  normal limits in size and symmetry.   No abnormal extra-axial fluid collections or masses are identified.  No significant calvarial abnormality.    IMPRESSION: 1. Negative for bleed or other acute intracranial process.  Original Report Authenticated By: Trecia Rogers, M.D.   Mr Angiogram Head Wo Contrast  02/20/2011  *RADIOLOGY REPORT*  Clinical Data:  Right upper extremity weakness.  Loss of sensation in the right arm.  Symptoms have since resolved.  MRA HEAD WITHOUT CONTRAST  Technique: Angiographic images of the Circle of Willis were obtained  using MRA technique without intravenous contrast.  Comparison: MRI brain and MRA head 05/03/2005.  Cerebral arteriogram 06/23/2005.  Findings: Atherosclerotic irregularity within the cavernous carotid arteries bilaterally is stable.  A right posterior communicating artery infundibulum is stable. The right A1 segment is the sole contributor to the anterior cerebral arteries bilaterally.  The anterior communicating artery is patent.  The left A1 segment is aplastic.  The MCA bifurcation is within normal limits bilaterally. There is significant attenuation and irregularity of MCA branch vessels bilaterally.  Some distal flow is noted within a hypoplastic right vertebral artery which terminates at the PICA.  The left vertebral artery is the dominant vessel.  The basilar artery is within normal limits. A 2.5 mm basilar tip aneurysm is not significantly changed from the prior exams.  Both posterior cerebral arteries originate from the basilar tip.  There is irregularity within distal PCA branch segments.    IMPRESSION:  1.  Moderate small vessel disease as previously seen. 2.  Atherosclerotic irregularity within the cavernous carotid arteries bilaterally. 3.  Stable right posterior communicating artery infundibulum. 4.  2.5 mm aneurysm at the basilar tip appears stable. 3.  No acute proximal occlusion.  Original Report Authenticated By: Resa Miner. MATTERN, M.D.   Mr Brain Wo Contrast  02/20/2011  *RADIOLOGY REPORT*  Clinical Data: Right hand numbness.  Loss of sensation  MRI HEAD WITHOUT CONTRAST  Technique:  Multiplanar, multiecho pulse sequences of the brain and surrounding structures were obtained according to standard protocol without intravenous contrast.  Comparison: CT head without contrast 02/19/2011  Findings: The diffusion weighted images reveal no evidence for acute or subacute infarction.  Remote to right inferior cerebellar infarcts are present.  There are remote lacunar infarcts of the basal ganglia  bilaterally.  Scattered periventricular subcortical white matter T2 and FLAIR hyperintensity is seen bilaterally.  No acute infarct, hemorrhage, or mass lesion is present.  Flow is present in the major intracranial arteries.  The globes and orbits are intact.  The paranasal sinuses and mastoid air cells are clear.    IMPRESSION:  1.  No acute intracranial abnormality. 2.  Remote to right inferior cerebellar infarcts. 3.  Remote lacunar infarcts of the basal ganglia bilaterally. 4.  Small vessel disease has advanced since the prior exam.  Original Report Authenticated By: Resa Miner. MATTERN, M.D.      Medications:  Scheduled:   . ipratropium  0.5 mg Nebulization NOW   And  . albuterol  2.5 mg Nebulization NOW  . amLODipine  5 mg Oral Daily  . ciprofloxacin  250 mg Oral BID  . clopidogrel  75 mg Oral Q breakfast  . doxazosin  4 mg Oral QHS  . famotidine  40 mg Oral BID  . guaiFENesin  600 mg Oral BID  . hydrochlorothiazide  25 mg Oral Daily  . insulin aspart  0-15 Units Subcutaneous TID WC  . ipratropium  0.5 mg Nebulization Q6H  .  potassium chloride  40 mEq Oral BID  . potassium chloride  10 mEq Intravenous Once  . potassium chloride  40 mEq Oral Once  . simvastatin  20 mg Oral q1800  . sodium chloride  3 mL Intravenous Q12H  . DISCONTD: albuterol  2.5 mg Nebulization Q4H  . DISCONTD: aspirin  300 mg Rectal Daily  . DISCONTD: aspirin  325 mg Oral Daily  . DISCONTD: ipratropium  0.5 mg Nebulization Q4H    Assessment/Plan:  65 YO female with transient right arm weakness and clumsiness now resolved. Patient has had right posterior CVA in 2007 with resulting Left CEA due to stenosis. Patients symptoms have now fully resolved.  MRI negative for new stroke.   Carotid doppler and 2 D Echo PENDING  ? TIA symptoms.  Would still recommend switching from ASA to plavix given sudden onset of symptoms.    Follow up out patient for  2.5 mm aneurysm at the basilar tip which is  stable.  Continue to treat UTI.  If carotid Doppler and 2-D echo normal no further neurological recommendations.      Etta Quill PA-C [facility] [phone]  02/20/2011, 9:39 AM    Please increase Zocor to 40 mg PO qhs  Justine Null, MD

## 2011-02-20 NOTE — Progress Notes (Signed)
*  PRELIMINARY RESULTS*  Carotid Doppler has been performed. Right ICA showed 40-59% stenosis highest end. Left ICA showed no significant ICA stenosis. Pt S/P Lt CCA graft. Antegrade vertebral flow bilaterally.  Landry Mellow 02/20/2011, 10:57 AM

## 2011-02-20 NOTE — Consult Note (Signed)
Tobacco cessation- Pt was smoking 1/2 ppd but has cut down gradually since then and is now down to 3 cigarettes/day. Action stage.  Congratulated pt for cutting down so much and encouraged to quit completely. Discussed behavior modification to help with triggers like smoking after meals. Made pt aware of nicotine gum or lozenges if unable to do it on her own. Discussed 2nd hand smoke and in home smoking policy. Referred to 1-800 quit now for f/u and support. Reviewed and gave pt written education/contact information.

## 2011-02-20 NOTE — Progress Notes (Signed)
Speech Language/Pathology  Pt. Currently having procedure in room.  Will attempt later today to initiate speech-language-cognitive assessment  Houston Siren 02/20/2011  [id number]

## 2011-02-20 NOTE — Progress Notes (Signed)
Subjective:  Patient seen, no further deficits. Patient says her weakness had resolved in 20 minutes. She has also been started on cipro empirically for ? UTI.  Objective: Vital signs in last 24 hours: Temp:  [97.5 F (36.4 C)-98.7 F (37.1 C)] 98.2 F (36.8 C) (11/08 1100) Pulse Rate:  [65-84] 70  (11/08 1127) Resp:  [15-21] 16  (11/08 1127) BP: (124-185)/(55-79) 168/78 mmHg (11/08 1127) SpO2:  [93 %-98 %] 94 % (11/08 1127) Weight:  [68.04 kg (150 lb)-68.402 kg (150 lb 12.8 oz)] 150 lb 12.8 oz (68.402 kg) (11/07 2300) Weight change:  Last BM Date: 02/20/11  Intake/Output from previous day: 11/07 0701 - 11/08 0700 In: 3 [I.V.:3] Out: -  Total I/O In: 240 [P.O.:240] Out: -    Physical Exam: General: Alert, awake, oriented x3, in no acute distress. HEENT: No bruits, no goiter. Heart: Regular rate and rhythm, without murmurs, rubs, gallops. Lungs: Clear to auscultation bilaterally. Abdomen: Soft, nontender, nondistended, positive bowel sounds. Extremities: No clubbing cyanosis or edema with positive pedal pulses. Neuro: Grossly intact, nonfocal.    Lab Results: Basic Metabolic Panel:  Basename 02/20/11 0700 02/19/11 1315  NA 142 140  K 3.0* 3.1*  CL 102 102  CO2 25 25  GLUCOSE 103* 122*  BUN 15 16  CREATININE 1.15* 1.09  CALCIUM 9.7 9.6  MG 1.8 --  PHOS -- --   Liver Function Tests:  Basename 02/20/11 0700  AST 15  ALT 9  ALKPHOS 76  BILITOT 0.4  PROT 6.6  ALBUMIN 3.4*   No results found for this basename: LIPASE:2,AMYLASE:2 in the last 72 hours No results found for this basename: AMMONIA:2 in the last 72 hours CBC:  Basename 02/20/11 0700 02/19/11 1315  WBC 10.5 13.2*  NEUTROABS 5.9 9.2*  HGB 13.1 13.7  HCT 39.7 40.2  MCV 83.2 81.9  PLT 274 294   Cardiac Enzymes:  Basename 02/19/11 1312  CKTOTAL 104  CKMB 2.4  CKMBINDEX --  TROPONINI <0.30   BNP: No results found for this basename: POCBNP:3 in the last 72 hours D-Dimer: No results  found for this basename: DDIMER:2 in the last 72 hours CBG:  Basename 02/20/11 1140 02/20/11 0749 02/19/11 2351  GLUCAP 111* 112* 157*   Hemoglobin A1C: No results found for this basename: HGBA1C in the last 72 hours Fasting Lipid Panel:  Basename 02/20/11 0700  CHOL 157  HDL 45  LDLCALC 90  TRIG 110  CHOLHDL 3.5  LDLDIRECT --     Basename 02/19/11 1315  LABPROT 13.3  INR 0.99    Studies/Results: Dg Chest 2 View  02/19/2011  *RADIOLOGY REPORT*  Clinical Data: Wheezing, headache  CHEST - 2 VIEW  Comparison: 10/22/2005  Findings: Atheromatous aortic arch. Lungs clear.  Heart size and pulmonary vascularity normal.  No effusion.  Visualized bones unremarkable.  Vascular clips at the thoracic inlet on the left.  IMPRESSION: No acute disease  Original Report Authenticated By: Trecia Rogers, M.D.   Ct Head Wo Contrast  02/19/2011  *RADIOLOGY REPORT*  Clinical Data: Hypertension, visual disturbances  CT HEAD WITHOUT CONTRAST  Technique:  Contiguous axial images were obtained from the base of the skull through the vertex without contrast.  Comparison: MR 05/11/2005  Findings: Atherosclerotic and physiologic intracranial calcifications. There is no evidence of acute intracranial hemorrhage, brain edema, mass lesion, acute infarction,   mass effect, or midline shift. Acute infarct may be inapparent on noncontrast CT.  No other intra-axial abnormalities are seen, and the  ventricles and sulci are within normal limits in size and symmetry.   No abnormal extra-axial fluid collections or masses are identified.  No significant calvarial abnormality.  IMPRESSION: 1. Negative for bleed or other acute intracranial process.  Original Report Authenticated By: Trecia Rogers, M.D.   Mr Angiogram Head Wo Contrast  02/20/2011  *RADIOLOGY REPORT*  Clinical Data:  Right upper extremity weakness.  Loss of sensation in the right arm.  Symptoms have since resolved.  MRA HEAD WITHOUT CONTRAST   Technique: Angiographic images of the Circle of Willis were obtained using MRA technique without intravenous contrast.  Comparison: MRI brain and MRA head 05/03/2005.  Cerebral arteriogram 06/23/2005.  Findings: Atherosclerotic irregularity within the cavernous carotid arteries bilaterally is stable.  A right posterior communicating artery infundibulum is stable. The right A1 segment is the sole contributor to the anterior cerebral arteries bilaterally.  The anterior communicating artery is patent.  The left A1 segment is aplastic.  The MCA bifurcation is within normal limits bilaterally. There is significant attenuation and irregularity of MCA branch vessels bilaterally.  Some distal flow is noted within a hypoplastic right vertebral artery which terminates at the PICA.  The left vertebral artery is the dominant vessel.  The basilar artery is within normal limits. A 2.5 mm basilar tip aneurysm is not significantly changed from the prior exams.  Both posterior cerebral arteries originate from the basilar tip.  There is irregularity within distal PCA branch segments.  IMPRESSION:  1.  Moderate small vessel disease as previously seen. 2.  Atherosclerotic irregularity within the cavernous carotid arteries bilaterally. 3.  Stable right posterior communicating artery infundibulum. 4.  2.5 mm aneurysm at the basilar tip appears stable. 3.  No acute proximal occlusion.  Original Report Authenticated By: Resa Miner. MATTERN, M.D.   Mr Brain Wo Contrast  02/20/2011  *RADIOLOGY REPORT*  Clinical Data: Right hand numbness.  Loss of sensation  MRI HEAD WITHOUT CONTRAST  Technique:  Multiplanar, multiecho pulse sequences of the brain and surrounding structures were obtained according to standard protocol without intravenous contrast.  Comparison: CT head without contrast 02/19/2011  Findings: The diffusion weighted images reveal no evidence for acute or subacute infarction.  Remote to right inferior cerebellar infarcts are  present.  There are remote lacunar infarcts of the basal ganglia bilaterally.  Scattered periventricular subcortical white matter T2 and FLAIR hyperintensity is seen bilaterally.  No acute infarct, hemorrhage, or mass lesion is present.  Flow is present in the major intracranial arteries.  The globes and orbits are intact.  The paranasal sinuses and mastoid air cells are clear.  IMPRESSION:  1.  No acute intracranial abnormality. 2.  Remote to right inferior cerebellar infarcts. 3.  Remote lacunar infarcts of the basal ganglia bilaterally. 4.  Small vessel disease has advanced since the prior exam.  Original Report Authenticated By: Resa Miner. MATTERN, M.D.    Medications: Scheduled Meds:   . ipratropium  0.5 mg Nebulization NOW   And  . albuterol  2.5 mg Nebulization NOW  . amLODipine  10 mg Oral Daily  . ciprofloxacin  250 mg Oral BID  . clopidogrel  75 mg Oral Q breakfast  . doxazosin  4 mg Oral QHS  . famotidine  40 mg Oral BID  . guaiFENesin  600 mg Oral BID  . hydrochlorothiazide  12.5 mg Oral Daily  . insulin aspart  0-15 Units Subcutaneous TID WC  . ipratropium  0.5 mg Nebulization Q6H  . lisinopril  10  mg Oral Daily  . potassium chloride  10 mEq Intravenous Once  . potassium chloride  40 mEq Oral Once  . potassium chloride  40 mEq Oral BID  . simvastatin  20 mg Oral q1800  . sodium chloride  3 mL Intravenous Q12H  . DISCONTD: albuterol  2.5 mg Nebulization Q4H  . DISCONTD: amLODipine  5 mg Oral Daily  . DISCONTD: aspirin  300 mg Rectal Daily  . DISCONTD: aspirin  325 mg Oral Daily  . DISCONTD: hydrochlorothiazide  25 mg Oral Daily  . DISCONTD: ipratropium  0.5 mg Nebulization Q4H  . DISCONTD: potassium chloride  40 mEq Oral BID   Continuous Infusions:   . sodium chloride     PRN Meds:.acetaminophen, acetaminophen, albuterol, ondansetron (ZOFRAN) IV, senna-docusate, sodium chloride  Assessment/Plan:  Active Problems:  TIA (transient ischemic attack) Resolved,  will d/c aspirin and start plavix as per neurology recommendation. Neurology following, stroke w/u in progress.  HTN (hypertension), benign BP is elevated, will cut down HCTZ due to hypokalemia and increase the dose of amlodipine. Will also add Lisinopril 10 mg po daily.  DM (diabetes mellitus) type II controlled with renal manifestation Continue sliding scale insulin.  Hypokalemia Will replace the potassium. Also will cut down the dose of HCTZ to 12.5 mg po daily.  Leukocytosis Sec to ? UTI, now resolved, continue Cipro. Await urine culture results.  UTI (lower urinary tract infection) Continue with Po Cipro. Tobacco abuse: Tobacco cessation consult.    LOS: 1 day   LAMA,GAGAN S 02/20/2011, 12:39 PM

## 2011-02-20 NOTE — ED Provider Notes (Signed)
Hendricks Milo, MD   Blair Heys, MD 02/20/11 2029

## 2011-02-20 NOTE — Progress Notes (Signed)
Speech Language/Pathology Speech Language Pathology Evaluation Patient Details Name: Paula Watson MRN: [id number] DOB: 06-12-1945 Today's Date: 02/20/2011  SLP Assessment/Plan/Recommendation Assessment Clinical Impression Statement: All areas of speech, language and cognition are within functional limits.  No speech therapy indicated at this time. SLP Recommendation/Assessment: Patent does not need any further Speech Lanaguage Pathology Services No Skilled Speech Therapy: Patient at baseline level of functioning Recommendation Follow up Recommendations: None   SLP Evaluation Prior Functioning  Prior Functional Status Cognitive/Linguistic Baseline: Within functional limits Lives With: Other (Comment) (sister) Cognition Cognition Arousal/Alertness: Awake/alert Orientation Level: Oriented X4 Attention: Sustained Sustained Attention: Appears intact Memory: Appears intact Awareness: Appears intact Problem Solving: Appears intact Executive Function:  (not assessed) Safety/Judgment: Appears intact Comprehension  Auditory Comprehension Yes/No Questions: Not tested Commands: Within Functional Limits (3 step) Conversation: Complex Visual Recognition/Discrimination Discrimination: Not tested Reading Comprehension Reading Status: Not tested Expression  Verbal Expression Initiation: No impairment Level of Generative/Spontaneous Verbalization: Conversation Repetition: No impairment Naming: No impairment Pragmatics: No impairment Written Expression Dominant Hand: Right Written Expression: Not tested Oral/Motor  Oral Motor/Sensory Function Labial Symmetry: Within Functional Limits Labial Strength: Within Functional Limits Labial Sensation: Within Functional Limits Lingual ROM: Within Functional Limits Lingual Symmetry: Within Functional Limits Lingual Strength: Within Functional Limits Facial ROM: Within Functional Limits Facial Symmetry: Within Functional Limits Facial  Strength: Within Functional Limits Facial Sensation: Within Functional Limits (Stated feeling of "cramp" in right face/mandible area) Velum: Within Functional Limits Mandible: Within Functional Limits Motor Speech Intelligibility: Intelligible  Houston Siren 02/20/2011, 11:17 AM

## 2011-02-20 NOTE — Progress Notes (Signed)
02/20/2011 Paula Watson [phone]      65yo female patient admitted on 02/19/11 with "rt hand not working right". Pt has history of CVA. TIA workup being performed. No home needs noted. In to speak with patient. Prior to admission, lived with sister and is independent with ADLs. Anticipated discharge will be to home with self care. Focus note in shadow chart. CM to continue to monitor.

## 2011-02-21 LAB — BASIC METABOLIC PANEL
BUN: 19 mg/dL (ref 6–23)
Calcium: 9.6 mg/dL (ref 8.4–10.5)
Chloride: 106 mEq/L (ref 96–112)
Creatinine, Ser: 1.25 mg/dL — ABNORMAL HIGH (ref 0.50–1.10)
GFR calc Af Amer: 51 mL/min — ABNORMAL LOW (ref >90)

## 2011-02-21 LAB — URINE CULTURE

## 2011-02-21 MED ORDER — POTASSIUM CHLORIDE IN NACL 20-0.9 MEQ/L-% IV SOLN
INTRAVENOUS | Status: DC
  Administered 2011-02-21: 10:00:00 via INTRAVENOUS
  Filled 2011-02-21 (×2): qty 1000

## 2011-02-21 MED ORDER — HYDROCHLOROTHIAZIDE 12.5 MG PO CAPS
12.5000 mg | ORAL_CAPSULE | Freq: Every day | ORAL | Status: DC
  Administered 2011-02-21: 12.5 mg via ORAL

## 2011-02-21 MED ORDER — AMLODIPINE BESYLATE 10 MG PO TABS: 10.0000 mg | ORAL_TABLET | Freq: Every day | ORAL | Status: DC

## 2011-02-21 MED ORDER — SIMVASTATIN 40 MG PO TABS
40.0000 mg | ORAL_TABLET | Freq: Every day | ORAL | Status: DC
  Filled 2011-02-21: qty 1

## 2011-02-21 MED ORDER — HYDROCHLOROTHIAZIDE 12.5 MG PO TABS: 12.5000 mg | ORAL_TABLET | Freq: Every day | ORAL | Status: DC

## 2011-02-21 MED ORDER — CLOPIDOGREL BISULFATE 75 MG PO TABS: 75.0000 mg | ORAL_TABLET | Freq: Every day | ORAL | Status: AC

## 2011-02-21 MED ORDER — CIPROFLOXACIN HCL 250 MG PO TABS: 250.0000 mg | ORAL_TABLET | Freq: Two times a day (BID) | ORAL | Status: AC

## 2011-02-21 NOTE — Progress Notes (Signed)
Subjective: No complaints.  Objective: Vital signs in last 24 hours: Temp:  [97.9 F (36.6 C)-98.5 F (36.9 C)] 97.9 F (36.6 C) (11/09 0532) Pulse Rate:  [57-82] 68  (11/09 0532) Resp:  [16-20] 16  (11/09 0532) BP: (126-190)/(71-81) 146/71 mmHg (11/09 0532) SpO2:  [94 %-97 %] 96 % (11/09 0742)  Intake/Output from previous day: 11/08 0701 - 11/09 0700 In: 360 [P.O.:360] Out: -  Intake/Output this shift:   Nutritional status: Carb Control  Past Medical History  Diagnosis Date  . Hypertension   . Diabetes mellitus   . High cholesterol   . GERD (gastroesophageal reflux disease)   . Stroke 2007 affected R side  . Vertigo now improved  . Poliomyelitis since 1948    has drop foot on R  . Polio 1953  . Cancer 1985    cervical  . Coronary artery disease   . Bronchitis     "have had a couple of times in my lifetime"  . Hyperlipidemia   . CKD (chronic kidney disease) stage 1, GFR 90 ml/min or greater was told by her PCP    Intolerant to ACE according to her PCP  . Blood transfusion   . Anemia   . Hemorrhoids     Neurologic Exam: Mental Status:  Alert, oriented, thought content appropriate. Speech fluent without evidence of aphasia. Able to follow 3 step commands without difficulty.  Cranial Nerves:  II-Discs flat bilaterally. Visual fields grossly intact.  III/IV/VI-Extraocular movements intact. Pupils reactive bilaterally.  V/VII-Smile symmetric  VIII-grossly intact  IX/X-normal gag  XI-bilateral shoulder shrug  XII-midline tongue extension  Motor: 5/5 bilaterally with normal tone and bulk except right LE foot drop secondary to polio  Sensory: Pinprick and light touch intact throughout, bilaterally  Deep Tendon Reflexes: 2+ and symmetric throughout  Plantars: Downgoing bilaterally  Cerebellar:. F-N and H-S smooth  Lab Results:  Basename 02/21/11 0548 02/20/11 0700 02/19/11 1315  WBC -- 10.5 13.2*  HGB -- 13.1 13.7  HCT -- 39.7 40.2  PLT -- 274 294  NA 142  142 --  K 3.7 3.0* --  CL 106 102 --  CO2 25 25 --  GLUCOSE 106* 103* --  BUN 19 15 --  CREATININE 1.25* 1.15* --  CALCIUM 9.6 9.7 --  LABA1C -- -- --   Lipid Panel  Basename 02/20/11 0700  CHOL 157  TRIG 110  HDL 45  CHOLHDL 3.5  VLDL 22  LDLCALC 90    Studies/Results: Dg Chest 2 View  02/19/2011  *RADIOLOGY REPORT*  Clinical Data: Wheezing, headache  CHEST - 2 VIEW  Comparison: 10/22/2005  Findings: Atheromatous aortic arch. Lungs clear.  Heart size and pulmonary vascularity normal.  No effusion.  Visualized bones unremarkable.  Vascular clips at the thoracic inlet on the left.  IMPRESSION: No acute disease  Original Report Authenticated By: Trecia Rogers, M.D.   Ct Head Wo Contrast  02/19/2011  *RADIOLOGY REPORT*  Clinical Data: Hypertension, visual disturbances  CT HEAD WITHOUT CONTRAST  Technique:  Contiguous axial images were obtained from the base of the skull through the vertex without contrast.  Comparison: MR 05/11/2005  Findings: Atherosclerotic and physiologic intracranial calcifications. There is no evidence of acute intracranial hemorrhage, brain edema, mass lesion, acute infarction,   mass effect, or midline shift. Acute infarct may be inapparent on noncontrast CT.  No other intra-axial abnormalities are seen, and the ventricles and sulci are within normal limits in size and symmetry.   No abnormal extra-axial  fluid collections or masses are identified.  No significant calvarial abnormality.  IMPRESSION: 1. Negative for bleed or other acute intracranial process.  Original Report Authenticated By: Trecia Rogers, M.D.   Mr Angiogram Head Wo Contrast  02/20/2011  *RADIOLOGY REPORT*  Clinical Data:  Right upper extremity weakness.  Loss of sensation in the right arm.  Symptoms have since resolved.  MRA HEAD WITHOUT CONTRAST  Technique: Angiographic images of the Circle of Willis were obtained using MRA technique without intravenous contrast.  Comparison: MRI  brain and MRA head 05/03/2005.  Cerebral arteriogram 06/23/2005.  Findings: Atherosclerotic irregularity within the cavernous carotid arteries bilaterally is stable.  A right posterior communicating artery infundibulum is stable. The right A1 segment is the sole contributor to the anterior cerebral arteries bilaterally.  The anterior communicating artery is patent.  The left A1 segment is aplastic.  The MCA bifurcation is within normal limits bilaterally. There is significant attenuation and irregularity of MCA branch vessels bilaterally.  Some distal flow is noted within a hypoplastic right vertebral artery which terminates at the PICA.  The left vertebral artery is the dominant vessel.  The basilar artery is within normal limits. A 2.5 mm basilar tip aneurysm is not significantly changed from the prior exams.  Both posterior cerebral arteries originate from the basilar tip.  There is irregularity within distal PCA branch segments.  IMPRESSION:  1.  Moderate small vessel disease as previously seen. 2.  Atherosclerotic irregularity within the cavernous carotid arteries bilaterally. 3.  Stable right posterior communicating artery infundibulum. 4.  2.5 mm aneurysm at the basilar tip appears stable. 3.  No acute proximal occlusion.  Original Report Authenticated By: Resa Miner. MATTERN, M.D.   Mr Brain Wo Contrast  02/20/2011  *RADIOLOGY REPORT*  Clinical Data: Right hand numbness.  Loss of sensation  MRI HEAD WITHOUT CONTRAST  Technique:  Multiplanar, multiecho pulse sequences of the brain and surrounding structures were obtained according to standard protocol without intravenous contrast.  Comparison: CT head without contrast 02/19/2011  Findings: The diffusion weighted images reveal no evidence for acute or subacute infarction.  Remote to right inferior cerebellar infarcts are present.  There are remote lacunar infarcts of the basal ganglia bilaterally.  Scattered periventricular subcortical white matter T2  and FLAIR hyperintensity is seen bilaterally.  No acute infarct, hemorrhage, or mass lesion is present.  Flow is present in the major intracranial arteries.  The globes and orbits are intact.  The paranasal sinuses and mastoid air cells are clear.  IMPRESSION:  1.  No acute intracranial abnormality. 2.  Remote to right inferior cerebellar infarcts. 3.  Remote lacunar infarcts of the basal ganglia bilaterally. 4.  Small vessel disease has advanced since the prior exam.  Original Report Authenticated By: Resa Miner. MATTERN, M.D.   Carotid Doppler has been performed. Right ICA showed 40-59% stenosis highest end. Left ICA showed no significant ICA stenosis. Pt S/P Lt CCA graft. Antegrade vertebral flow bilaterally.  Medications:  Scheduled:   . amLODipine  10 mg Oral Daily  . ciprofloxacin  250 mg Oral BID  . clopidogrel  75 mg Oral Q breakfast  . doxazosin  4 mg Oral QHS  . famotidine  40 mg Oral BID  . guaiFENesin  600 mg Oral BID  . hydrochlorothiazide  12.5 mg Oral Daily  . insulin aspart  0-15 Units Subcutaneous TID WC  . ipratropium  0.5 mg Nebulization Q6H  . potassium chloride  10 mEq Intravenous Once  . potassium  chloride  40 mEq Oral BID  . simvastatin  40 mg Oral q1800  . DISCONTD: amLODipine  5 mg Oral Daily  . DISCONTD: hydrochlorothiazide  25 mg Oral Daily  . DISCONTD: lisinopril  10 mg Oral Daily  . DISCONTD: potassium chloride  40 mEq Oral BID  . DISCONTD: simvastatin  20 mg Oral q1800  . DISCONTD: sodium chloride  3 mL Intravenous Q12H    Assessment/Plan:  65 YO female with transient right arm weakness and clumsiness now resolved. Patient has had right posterior CVA in 2007 with resulting Left CEA due to stenosis. Patients symptoms have now fully resolved.  MRI negative for new stroke.   Carotid Doppler has been performed. Right ICA showed 40-59% stenosis highest end. Left ICA showed no significant ICA stenosis. Pt S/P Lt CCA graft. Antegrade vertebral flow  bilaterally.  2-D echo: Left ventricle: The cavity size was normal. There was mild concentric hypertrophy. Systolic function was normal. The estimated ejection fraction was in the range of 60% to 65%. Wall motion was normal; there were no regional wall motion Abnormalities. Impressions: - No cardiac source of embolism was identified  ? TIA symptoms. Would still recommend switching from ASA to plavix given sudden onset of symptoms.  Follow up out patient for 2.5 mm aneurysm at the basilar tip which is stable.  Continue to treat UTI.   Neuro S/O   Etta Quill PA-C [facility] [phone]  02/21/2011, 9:34 AM

## 2011-02-21 NOTE — Progress Notes (Signed)
Occupational Therapy Evaluation Patient Details Name: Paula Watson MRN: [id number] DOB: 1945/08/15 Today's Date: 02/21/2011  Problem List:  Patient Active Problem List  Diagnoses  . TIA (transient ischemic attack)  . HTN (hypertension), benign  . DM (diabetes mellitus) type II controlled with renal manifestation  . Hypokalemia  . Leukocytosis  . UTI (lower urinary tract infection)  . Tobacco abuse    Past Medical History:  Past Medical History  Diagnosis Date  . Hypertension   . Diabetes mellitus   . High cholesterol   . GERD (gastroesophageal reflux disease)   . Stroke 2007 affected R side  . Vertigo now improved  . Poliomyelitis since 1948    has drop foot on R  . Polio 1953  . Cancer 1985    cervical  . Coronary artery disease   . Bronchitis     "have had a couple of times in my lifetime"  . Hyperlipidemia   . CKD (chronic kidney disease) stage 1, GFR 90 ml/min or greater was told by her PCP    Intolerant to ACE according to her PCP  . Blood transfusion   . Anemia   . Hemorrhoids    Past Surgical History:  Past Surgical History  Procedure Date  . Carotid endarterectomy 2000 L side  . Coronary angioplasty stented 2007  . Tonsillectomy 1954  . Foot reconstructjion 1953    right foot; post polio  . Cervical conization w/bx 1985  . Dilation and curettage of uterus 1980's    OT Assessment/Plan/Recommendation OT Assessment Clinical Impression Statement: Pt. appears to be back to baseline.  MRI negative.  No further OT needs identified.  Will sign off OT Recommendation/Assessment: Patient does not need any further OT services OT Goals    OT Evaluation Precautions/Restrictions  Restrictions Weight Bearing Restrictions: No Prior Functioning     ADL ADL Eating/Feeding: Performed;Independent Where Assessed - Eating/Feeding: Chair Grooming: Simulated;Wash/dry hands;Teeth care;Brushing hair;Applying makeup;Modified independent Where Assessed - Grooming:  Standing at sink Upper Body Bathing: Simulated;Modified independent Where Assessed - Upper Body Bathing: Sitting, chair;Standing at sink Lower Body Bathing: Simulated;Modified independent Where Assessed - Lower Body Bathing: Standing at sink Upper Body Dressing: Simulated;Modified independent Where Assessed - Upper Body Dressing: Sitting, chair;Sitting, bed Lower Body Dressing: Simulated;Modified independent Where Assessed - Lower Body Dressing: Sit to stand from chair;Sit to stand from bed Toilet Transfer: Simulated;Modified independent Toilet Transfer Method: Counselling psychologist: Regular height toilet Toileting - Clothing Manipulation: Simulated;Modified independent Where Assessed - Toileting Clothing Manipulation: Sit on 3-in-1 or toilet;Standing Toileting - Hygiene: Simulated;Independent Where Assessed - Toileting Hygiene: Sit on 3-in-1 or toilet Tub/Shower Transfer: Not assessed Equipment Used:  (AFO which pt. used PTA due to polio) ADL Comments: Pt. performs at Modified independent level due to use of AFO.   Vision/Perception  Vision - History Baseline Vision: Wears glasses all the time (prescription not updated) Patient Visual Report: No change from baseline Vision - Assessment Eye Alignment: Within Functional Limits Vision Assessment: Vision tested Ocular Range of Motion: Within Functional Limits Tracking/Visual Pursuits: Able to track stimulus in all quads without difficulty Visual Fields: No apparent deficits Perception Perception: Within Functional Limits Praxis Praxis: Intact Cognition Cognition Arousal/Alertness: Awake/alert Overall Cognitive Status: Appears within functional limits for tasks assessed Orientation Level: Oriented X4 Sensation/Coordination Sensation Light Touch: Appears Intact (Pt. denies sensory changes) Coordination Gross Motor Movements are Fluid and Coordinated: Yes Fine Motor Movements are Fluid and Coordinated:  Yes Extremity Assessment RUE Assessment RUE Assessment: Within Functional Limits  LUE Assessment LUE Assessment: Within Functional Limits Mobility  Bed Mobility Bed Mobility: Yes Supine to Sit: 7: Independent Transfers Transfers: Yes Sit to Stand: 6: Modified independent (Device/Increase time) (AFO Rt LE) Stand to Sit: 6: Modified independent (Device/Increase time) Exercises   End of Session OT - End of Session Equipment Utilized During Treatment:  (None) Activity Tolerance: Patient tolerated treatment well Patient left: in chair General Behavior During Session: [facility] for tasks performed Cognition: [facility] for tasks performed   Gerritt Galentine M 02/21/2011, 12:37 PM

## 2011-02-21 NOTE — Patient Instructions (Signed)
Pt. Was instructed in signs and symptoms of stroke, and to call 911 if these occur.  She verbalizes understanding and reports she has been reading stroke booklet.  Pt. Instructed to call nurse if she needs to get up due to having IV in Rt. UE.  She verbalized understanding

## 2011-02-21 NOTE — Discharge Summary (Signed)
Paula Watson, 65 y.o., DOB May 26, 1945, MRN [id number]. Admission date: 02/19/2011 Discharge Date 02/21/2011 Primary MD No primary provider on file. Admitting Physician Oswald Hillock  Admission Diagnosis  TIA (transient ischemic attack) [435.9] Bronchitis [490] Brain TIA  Discharge Diagnosis   Active Problems:  TIA (transient ischemic attack)  HTN (hypertension), benign  DM (diabetes mellitus) type II controlled with renal manifestation  Hypokalemia  Leukocytosis  UTI (lower urinary tract infection)  Tobacco abuse      Past Medical History  Diagnosis Date  . Hypertension   . Diabetes mellitus   . High cholesterol   . GERD (gastroesophageal reflux disease)   . Stroke 2007 affected R side  . Vertigo now improved  . Poliomyelitis since 1948    has drop foot on R  . Polio 1953  . Cancer 1985    cervical  . Coronary artery disease   . Bronchitis     "have had a couple of times in my lifetime"  . Hyperlipidemia   . CKD (chronic kidney disease) stage 1, GFR 90 ml/min or greater was told by her PCP    Intolerant to ACE according to her PCP  . Blood transfusion   . Anemia   . Hemorrhoids     Past Surgical History  Procedure Date  . Carotid endarterectomy 2000 L side  . Coronary angioplasty stented 2007  . Tonsillectomy 1954  . Foot reconstructjion 1953    right foot; post polio  . Cervical conization w/bx 1985  . Dilation and curettage of uterus 1980's    Consults  neurology  Significant Tests:  See full reports for all details   Dg Chest 2 View  02/19/2011  *RADIOLOGY REPORT*  Clinical Data: Wheezing, headache  CHEST - 2 VIEW  Comparison: 10/22/2005  Findings: Atheromatous aortic arch. Lungs clear.  Heart size and pulmonary vascularity normal.  No effusion.  Visualized bones unremarkable.  Vascular clips at the thoracic inlet on the left.   IMPRESSION: No acute disease  Original Report Authenticated By: Trecia Rogers, M.D.   Ct Head Wo Contrast  02/19/2011   *RADIOLOGY REPORT*  Clinical Data: Hypertension, visual disturbances  CT HEAD WITHOUT CONTRAST  Technique:  Contiguous axial images were obtained from the base of the skull through the vertex without contrast.  Comparison: MR 05/11/2005  Findings: Atherosclerotic and physiologic intracranial calcifications. There is no evidence of acute intracranial hemorrhage, brain edema, mass lesion, acute infarction,   mass effect, or midline shift. Acute infarct may be inapparent on noncontrast CT.  No other intra-axial abnormalities are seen, and the ventricles and sulci are within normal limits in size and symmetry.   No abnormal extra-axial fluid collections or masses are identified.  No significant calvarial abnormality.    IMPRESSION: 1. Negative for bleed or other acute intracranial process.  Original Report Authenticated By: Trecia Rogers, M.D.   Mr Angiogram Head Wo Contrast  02/20/2011  *RADIOLOGY REPORT*  Clinical Data:  Right upper extremity weakness.  Loss of sensation in the right arm.  Symptoms have since resolved.  MRA HEAD WITHOUT CONTRAST  Technique: Angiographic images of the Circle of Willis were obtained using MRA technique without intravenous contrast.  Comparison: MRI brain and MRA head 05/03/2005.  Cerebral arteriogram 06/23/2005.  Findings: Atherosclerotic irregularity within the cavernous carotid arteries bilaterally is stable.  A right posterior communicating artery infundibulum is stable. The right A1 segment is the sole contributor to the anterior cerebral arteries bilaterally.  The anterior  communicating artery is patent.  The left A1 segment is aplastic.  The MCA bifurcation is within normal limits bilaterally. There is significant attenuation and irregularity of MCA branch vessels bilaterally.  Some distal flow is noted within a hypoplastic right vertebral artery which terminates at the PICA.  The left vertebral artery is the dominant vessel.  The basilar artery is within normal  limits. A 2.5 mm basilar tip aneurysm is not significantly changed from the prior exams.  Both posterior cerebral arteries originate from the basilar tip.  There is irregularity within distal PCA branch segments .   IMPRESSION:  1.  Moderate small vessel disease as previously seen. 2.  Atherosclerotic irregularity within the cavernous carotid arteries bilaterally. 3.  Stable right posterior communicating artery infundibulum. 4.  2.5 mm aneurysm at the basilar tip appears stable. 3.  No acute proximal occlusion.  Original Report Authenticated By: Resa Miner. MATTERN, M.D.   Mr Brain Wo Contrast  02/20/2011  *RADIOLOGY REPORT*  Clinical Data: Right hand numbness.  Loss of sensation  MRI HEAD WITHOUT CONTRAST  Technique:  Multiplanar, multiecho pulse sequences of the brain and surrounding structures were obtained according to standard protocol without intravenous contrast.  Comparison: CT head without contrast 02/19/2011  Findings: The diffusion weighted images reveal no evidence for acute or subacute infarction.  Remote to right inferior cerebellar infarcts are present.  There are remote lacunar infarcts of the basal ganglia bilaterally.  Scattered periventricular subcortical white matter T2 and FLAIR hyperintensity is seen bilaterally.  No acute infarct, hemorrhage, or mass lesion is present.  Flow is present in the major intracranial arteries.  The globes and orbits are intact.  The paranasal sinuses and mastoid air cells are clear.    IMPRESSION:  1.  No acute intracranial abnormality. 2.  Remote to right inferior cerebellar infarcts. 3.  Remote lacunar infarcts of the basal ganglia bilaterally. 4.  Small vessel disease has advanced since the prior exam.  Original Report Authenticated By: Resa Miner. MATTERN, M.D.    Brief H&P Paula Watson is a 65 y.o. female with the history of:  Patient Active Problem List   Diagnoses   .  CVA   .  HTN (hypertension), benign   .  DM (diabetes mellitus)  type II controlled with renal manifestation   Presented with sudden onset of right hand weakness starting and 9:30 AM. Patient was trying to open a door and realized that she was unable to do so. She denied any change in sensation of her right hand. She informed her family who called 911. EMT checked her blood pressure and it was noted to be high at 230/100. By the time patient presented to emergency department his symptoms have completely resolved and currently she is back to her neurological baseline.  Of note in the emergency department she did receive a CT scan of the head that was unremarkable and also have gone down for an MRI.  Of note the past she had had a history of CVA in 2007 also affected her right side and was associated and mild vertigo. She also has a history of poor new myelitis affecting her right leg and has a chronic right foot drop. Of note since her last CVA she had near complete recovery is no significant residual deficits. She has been followed by Dr. Jannifer Franklin and neurology. She was taken off of her Plavix and currently taking baby aspirin twice a day.      Today   Subjective:  Paula Watson today has no headache,no chest abdominal pain,no new weakness tingling or numbness, feels much better wants to go home today.  Objective:   Blood pressure 179/72, pulse 72, temperature 98.3 F (36.8 C), temperature source Oral, resp. rate 18, height 5\' 2"  (1.575 m), weight 68.402 kg (150 lb 12.8 oz), SpO2 97.00%.  Intake/Output Summary (Last 24 hours) at 02/21/11 1122 Last data filed at 02/21/11 0800  Gross per 24 hour  Intake    360 ml  Output      0 ml  Net    360 ml    Exam Awake Alert, Oriented 3, No new F.N deficits, Normal affect [place].AT,PERRAL Supple Neck,No JVD, No cervical lymphadenopathy appriciated.  Symmetrical Chest wall movement, Good air movement bilaterally, CTAB RRR,No Gallops,Rubs or new Murmurs, No Parasternal Heave +ve B.Sounds, Abd Soft, Non tender, No  organomegaly appriciated, No rebound -guarding or rigidity. No Cyanosis, Clubbing or edema, No new Rash or bruise   HOSPITAL COURSE:  TIA: Patient admitted with TIA, symptoms resolved by the time the patient came to hospital. Neurology saw the patient and recommended to change aspirin to plavix. MRI is  negative for acute stroke. Patient will continue to take pravastatin.  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 Patient follow Dr Jannifer Franklin as outpatient, and will follow up with him. He is aware of patient's aneurysm as per patient.  UTI: Patient was found to have leukocytosis, and UA was abnormal, urine cultures are growing e coli, sensitivities are pending, currently she is on cipro, which will be continued. Will check the sensitivities and call her if e coli not sensitive to cipro. Though patient's wbc has improved on cipro.  Hypertension: BP is elevated, HCTZ dose reduced to 12.5 mg as patient found to have mild acute renal insufficiency, and the dose of Amlodipine increased to 10 mg po daily.patient does not want Lisinopril as she had elevated creatinine in the past when she took lisinopril. She wants to check with PCP before initiating lisinopril. She needs close monitoring of BP at home.  Acute Renal Insufficiency:( Mild) Cr is 1.25, Will give IV fluids , needs to check renal function in 2 weeks.  Double vision: Patient has longstanding h/o diabetes. Will follow ophthalmologist as outpatient. Discussed with patient.  Data Review     Cultures - Results for orders placed during the hospital encounter of 02/19/11  URINE CULTURE     Status: Normal (Preliminary result)   Collection Time   02/19/11  1:46 PM      Component Value Range Status Comment   Specimen Description URINE, CLEAN CATCH   Final    Special Requests ADDED [id number]   Final    Setup Time [id number]   Final    Colony Count >=100,000 COLONIES/ML    Final    Culture ESCHERICHIA COLI   Final    Report Status PENDING   Incomplete      CBC w Diff: Lab Results  Component Value Date   WBC 10.5 02/20/2011   HGB 13.1 02/20/2011   HCT 39.7 02/20/2011   PLT 274 02/20/2011   LYMPHOPCT 32 02/20/2011   MONOPCT 9 02/20/2011   EOSPCT 4 02/20/2011   BASOPCT 0 02/20/2011   CMP: Lab Results  Component Value Date   NA 142 02/21/2011   K 3.7 02/21/2011   CL 106 02/21/2011   CO2 25 02/21/2011   BUN 19 02/21/2011   CREATININE 1.25*  02/21/2011   PROT 6.6 02/20/2011   ALBUMIN 3.4* 02/20/2011   BILITOT 0.4 02/20/2011   ALKPHOS 76 02/20/2011   AST 15 02/20/2011   ALT 9 02/20/2011  .    Discharge Medications   Medication List  As of 02/21/2011 11:22 AM   START taking these medications         ciprofloxacin 250 MG tablet   Commonly known as: CIPRO   Take 1 tablet (250 mg total) by mouth 2 (two) times daily.      clopidogrel 75 MG tablet   Commonly known as: PLAVIX   Take 1 tablet (75 mg total) by mouth daily with breakfast.         CHANGE how you take these medications         amLODipine 10 MG tablet   Commonly known as: NORVASC   Take 1 tablet (10 mg total) by mouth daily.   What changed: - medication strength - dose      hydrochlorothiazide 12.5 MG tablet   Commonly known as: HYDRODIURIL   Take 1 tablet (12.5 mg total) by mouth daily.   What changed: - medication strength - dose         CONTINUE taking these medications         dextromethorphan-guaiFENesin 30-600 MG per 12 hr tablet   Commonly known as: MUCINEX DM      doxazosin 4 MG tablet   Commonly known as: CARDURA      famotidine 20 MG tablet   Commonly known as: PEPCID      metFORMIN 500 MG 24 hr tablet   Commonly known as: GLUCOPHAGE-XR      pravastatin 40 MG tablet   Commonly known as: PRAVACHOL         STOP taking these medications         aspirin EC 81 MG tablet          Where to get your medications    These are the prescriptions that you need to pick  up.   You may get these medications from any pharmacy.         amLODipine 10 MG tablet   ciprofloxacin 250 MG tablet   clopidogrel 75 MG tablet   hydrochlorothiazide 12.5 MG tablet            Total Time in preparing paper work, data evaluation and todays exam - 35 minutes  Signed: LAMA,GAGAN S 02/21/2011 11:22 AM

## 2011-02-22 ENCOUNTER — Telehealth: Payer: Self-pay | Admitting: Family Medicine

## 2011-02-22 ENCOUNTER — Encounter: Payer: Self-pay | Admitting: Family Medicine

## 2011-02-22 NOTE — Telephone Encounter (Signed)
Called the Patient and informed that E coli is sensitive to cipro, she says she has left sided abdominal tenderness, I recommended to call PCP and get herself checked. She was also advised to continue taking antibiotics as prescribed for UTI .

## 2011-06-16 ENCOUNTER — Other Ambulatory Visit: Payer: Self-pay | Admitting: Nephrology

## 2011-06-24 ENCOUNTER — Ambulatory Visit
Admission: RE | Admit: 2011-06-24 | Discharge: 2011-06-24 | Disposition: A | Discharge status: Home / Self Care | Payer: Medicare Other | Source: Ambulatory Visit | Attending: Nephrology | Admitting: Nephrology

## 2013-06-15 ENCOUNTER — Emergency Department (HOSPITAL_COMMUNITY)
Admission: EM | Admit: 2013-06-15 | Discharge: 2013-06-16 | Disposition: A | Discharge status: Home / Self Care | Payer: Medicare Other | Attending: Emergency Medicine | Admitting: Emergency Medicine

## 2013-06-15 ENCOUNTER — Encounter (HOSPITAL_COMMUNITY): Payer: Self-pay | Admitting: Emergency Medicine

## 2013-06-15 ENCOUNTER — Emergency Department (HOSPITAL_COMMUNITY): Payer: Medicare Other

## 2013-06-15 DIAGNOSIS — E119 Type 2 diabetes mellitus without complications: Secondary | ICD-10-CM | POA: Insufficient documentation

## 2013-06-15 DIAGNOSIS — Z8541 Personal history of malignant neoplasm of cervix uteri: Secondary | ICD-10-CM | POA: Insufficient documentation

## 2013-06-15 DIAGNOSIS — Z862 Personal history of diseases of the blood and blood-forming organs and certain disorders involving the immune mechanism: Secondary | ICD-10-CM | POA: Insufficient documentation

## 2013-06-15 DIAGNOSIS — N181 Chronic kidney disease, stage 1: Secondary | ICD-10-CM | POA: Insufficient documentation

## 2013-06-15 DIAGNOSIS — Z880 Allergy status to penicillin: Secondary | ICD-10-CM | POA: Insufficient documentation

## 2013-06-15 DIAGNOSIS — Z9861 Coronary angioplasty status: Secondary | ICD-10-CM | POA: Insufficient documentation

## 2013-06-15 DIAGNOSIS — E7800 Pure hypercholesterolemia, unspecified: Secondary | ICD-10-CM | POA: Insufficient documentation

## 2013-06-15 DIAGNOSIS — Z8673 Personal history of transient ischemic attack (TIA), and cerebral infarction without residual deficits: Secondary | ICD-10-CM | POA: Insufficient documentation

## 2013-06-15 DIAGNOSIS — E785 Hyperlipidemia, unspecified: Secondary | ICD-10-CM | POA: Insufficient documentation

## 2013-06-15 DIAGNOSIS — Z8612 Personal history of poliomyelitis: Secondary | ICD-10-CM | POA: Insufficient documentation

## 2013-06-15 DIAGNOSIS — I2510 Atherosclerotic heart disease of native coronary artery without angina pectoris: Secondary | ICD-10-CM | POA: Insufficient documentation

## 2013-06-15 DIAGNOSIS — N12 Tubulo-interstitial nephritis, not specified as acute or chronic: Secondary | ICD-10-CM | POA: Insufficient documentation

## 2013-06-15 DIAGNOSIS — Z79899 Other long term (current) drug therapy: Secondary | ICD-10-CM | POA: Insufficient documentation

## 2013-06-15 DIAGNOSIS — R112 Nausea with vomiting, unspecified: Secondary | ICD-10-CM | POA: Insufficient documentation

## 2013-06-15 DIAGNOSIS — F17200 Nicotine dependence, unspecified, uncomplicated: Secondary | ICD-10-CM | POA: Insufficient documentation

## 2013-06-15 DIAGNOSIS — Z8709 Personal history of other diseases of the respiratory system: Secondary | ICD-10-CM | POA: Insufficient documentation

## 2013-06-15 DIAGNOSIS — K219 Gastro-esophageal reflux disease without esophagitis: Secondary | ICD-10-CM | POA: Insufficient documentation

## 2013-06-15 DIAGNOSIS — I129 Hypertensive chronic kidney disease with stage 1 through stage 4 chronic kidney disease, or unspecified chronic kidney disease: Secondary | ICD-10-CM | POA: Insufficient documentation

## 2013-06-15 LAB — BASIC METABOLIC PANEL
BUN: 18 mg/dL (ref 6–23)
CHLORIDE: 103 meq/L (ref 96–112)
CO2: 20 mEq/L (ref 19–32)
CREATININE: 1.2 mg/dL — AB (ref 0.50–1.10)
Calcium: 8.7 mg/dL (ref 8.4–10.5)
GFR, EST AFRICAN AMERICAN: 53 mL/min — AB (ref >90)
GFR, EST NON AFRICAN AMERICAN: 46 mL/min — AB (ref >90)
Glucose, Bld: 118 mg/dL — ABNORMAL HIGH (ref 70–99)
POTASSIUM: 4.1 meq/L (ref 3.7–5.3)
Sodium: 139 mEq/L (ref 137–147)

## 2013-06-15 LAB — LACTIC ACID, PLASMA: LACTIC ACID, VENOUS: 0.8 mmol/L (ref 0.5–2.2)

## 2013-06-15 LAB — URINE MICROSCOPIC-ADD ON

## 2013-06-15 LAB — CBC WITH DIFFERENTIAL/PLATELET
BASOS ABS: 0 10*3/uL (ref 0.0–0.1)
BASOS PCT: 0 % (ref 0–1)
EOS ABS: 0.1 10*3/uL (ref 0.0–0.7)
Eosinophils Relative: 1 % (ref 0–5)
HCT: 37.1 % (ref 36.0–46.0)
Hemoglobin: 12.4 g/dL (ref 12.0–15.0)
Lymphocytes Relative: 13 % (ref 12–46)
Lymphs Abs: 2.1 10*3/uL (ref 0.7–4.0)
MCH: 28.3 pg (ref 26.0–34.0)
MCHC: 33.4 g/dL (ref 30.0–36.0)
MCV: 84.7 fL (ref 78.0–100.0)
MONO ABS: 1.5 10*3/uL — AB (ref 0.1–1.0)
Monocytes Relative: 10 % (ref 3–12)
NEUTROS ABS: 12 10*3/uL — AB (ref 1.7–7.7)
NEUTROS PCT: 76 % (ref 43–77)
Platelets: 272 10*3/uL (ref 150–400)
RBC: 4.38 MIL/uL (ref 3.87–5.11)
RDW: 13.6 % (ref 11.5–15.5)
WBC: 15.8 10*3/uL — ABNORMAL HIGH (ref 4.0–10.5)

## 2013-06-15 LAB — URINALYSIS, ROUTINE W REFLEX MICROSCOPIC
BILIRUBIN URINE: NEGATIVE
Glucose, UA: NEGATIVE mg/dL
KETONES UR: 15 mg/dL — AB
NITRITE: NEGATIVE
PROTEIN: 100 mg/dL — AB
Specific Gravity, Urine: 1.018 (ref 1.005–1.030)
UROBILINOGEN UA: 0.2 mg/dL (ref 0.0–1.0)
pH: 5.5 (ref 5.0–8.0)

## 2013-06-15 MED ORDER — SODIUM CHLORIDE 0.9 % IV SOLN
1000.0000 mL | INTRAVENOUS | Status: DC
  Administered 2013-06-15: 1000 mL via INTRAVENOUS

## 2013-06-15 MED ORDER — SODIUM CHLORIDE 0.9 % IV SOLN
1000.0000 mL | Freq: Once | INTRAVENOUS | Status: AC
  Administered 2013-06-15: 1000 mL via INTRAVENOUS

## 2013-06-15 NOTE — ED Notes (Addendum)
Pt arrives via EMS c/o 10/10 back pain since 11p. EMS gave 10 morphine, 4 zofran. Pain now 5/10. CBG 131. EKG unremarkable. 18 RA. BP 165/90 HR 74. Pt states that her pain feels like a labor contraction.

## 2013-06-15 NOTE — ED Notes (Signed)
Dr Venora Maples in room

## 2013-06-15 NOTE — ED Provider Notes (Signed)
CSN: [id number]     Arrival date & time 06/15/13  2143 History   First MD Initiated Contact with Patient 06/15/13 2148     Chief Complaint  Patient presents with  . Back Pain      HPI Patient reports developing flank pain with some radiation around to her left lower abdomen which started today.  Her pain was severe and colicky in nature.  She had associated nausea and vomiting with it.  She reports some urinary frequency without dysuria.  She's had chills without documented fever.  She denies hematemesis.  She's had no diarrhea.  She's never had pain like this before.  No prior history of kidney stones.  She denies weakness in her legs.  She states on arrival to emergency department her pain is significantly reduced.  She states she has no pain or discomfort at this time.   Past Medical History  Diagnosis Date  . Hypertension   . Diabetes mellitus   . High cholesterol   . GERD (gastroesophageal reflux disease)   . Stroke 2007 affected R side  . Vertigo now improved  . Poliomyelitis since 1948    has drop foot on R  . Polio 1953  . Cancer 1985    cervical  . Coronary artery disease   . Bronchitis     "have had a couple of times in my lifetime"  . Hyperlipidemia   . CKD (chronic kidney disease) stage 1, GFR 90 ml/min or greater was told by her PCP    Intolerant to ACE according to her PCP  . Blood transfusion   . Anemia   . Hemorrhoids    Past Surgical History  Procedure Laterality Date  . Carotid endarterectomy  2000 L side  . Coronary angioplasty  stented 2007  . Tonsillectomy  1954  . Foot reconstructjion  1953    right foot; post polio  . Cervical conization w/bx  1985  . Dilation and curettage of uterus  1980's   Family History  Problem Relation Age of Onset  . Coronary artery disease Mother   . Diabetes type II Father   . Cancer Father   . Brain cancer Father   . Cancer Sister   . Coronary artery disease Brother    History  Substance Use Topics  . Smoking  status: Current Every Day Smoker -- 0.25 packs/day for 40 years    Types: Cigarettes  . Smokeless tobacco: Never Used     Comment: "working hard to quit"  . Alcohol Use: No   OB History   Grav Para Term Preterm Abortions TAB SAB Ect Mult Living                 Review of Systems  All other systems reviewed and are negative.      Allergies  Penicillins  Home Medications   Current Outpatient Rx  Name  Route  Sig  Dispense  Refill  . EXPIRED: amLODipine (NORVASC) 10 MG tablet   Oral   Take 1 tablet (10 mg total) by mouth daily.   30 tablet   2   . dextromethorphan-guaiFENesin (MUCINEX DM) 30-600 MG per 12 hr tablet   Oral   Take 1 tablet by mouth every 12 (twelve) hours.           Marland Kitchen doxazosin (CARDURA) 4 MG tablet   Oral   Take 4 mg by mouth at bedtime.           . famotidine (PEPCID)  20 MG tablet   Oral   Take 40 mg by mouth 2 (two) times daily.           Marland Kitchen EXPIRED: hydrochlorothiazide (HYDRODIURIL) 12.5 MG tablet   Oral   Take 1 tablet (12.5 mg total) by mouth daily.   30 tablet   2   . metFORMIN (GLUCOPHAGE-XR) 500 MG 24 hr tablet   Oral   Take 500 mg by mouth 2 (two) times daily.           . pravastatin (PRAVACHOL) 40 MG tablet   Oral   Take 40 mg by mouth daily.            BP 147/54  Pulse 81  Temp(Src) 100.5 F (38.1 C) (Rectal)  Resp 18  SpO2 97% Physical Exam  Nursing note and vitals reviewed. Constitutional: She is oriented to person, place, and time. She appears well-developed and well-nourished. No distress.  HENT:  Head: Normocephalic and atraumatic.  Eyes: EOM are normal.  Neck: Normal range of motion.  Cardiovascular: Normal rate, regular rhythm and normal heart sounds.   Pulmonary/Chest: Effort normal and breath sounds normal.  Abdominal: Soft. She exhibits no distension.  Mild left lower quadrant tenderness without guarding or rebound  Genitourinary:  No CVA tenderness  Musculoskeletal: Normal range of motion.   Neurological: She is alert and oriented to person, place, and time.  Skin: Skin is warm and dry. No rash noted.  Psychiatric: She has a normal mood and affect. Judgment normal.    ED Course  Procedures (including critical care time) Labs Review Labs Reviewed  URINALYSIS, ROUTINE W REFLEX MICROSCOPIC - Abnormal; Notable for the following:    APPearance TURBID (*)    Hgb urine dipstick LARGE (*)    Ketones, ur 15 (*)    Protein, ur 100 (*)    Leukocytes, UA LARGE (*)    All other components within normal limits  CBC WITH DIFFERENTIAL - Abnormal; Notable for the following:    WBC 15.8 (*)    Neutro Abs 12.0 (*)    Monocytes Absolute 1.5 (*)    All other components within normal limits  BASIC METABOLIC PANEL - Abnormal; Notable for the following:    Glucose, Bld 118 (*)    Creatinine, Ser 1.20 (*)    GFR calc non Af Amer 46 (*)    GFR calc Af Amer 53 (*)    All other components within normal limits  URINE MICROSCOPIC-ADD ON - Abnormal; Notable for the following:    Bacteria, UA FEW (*)    All other components within normal limits  LACTIC ACID, PLASMA   Imaging Review Ct Abdomen Pelvis Wo Contrast  06/16/2013   CLINICAL DATA:  Back pain.  Diabetic hypertensive patient.  EXAM: CT ABDOMEN AND PELVIS WITHOUT CONTRAST  TECHNIQUE: Multidetector CT imaging of the abdomen and pelvis was performed following the standard protocol without intravenous contrast.  COMPARISON:  No comparison CT. Comparison renal sonogram 06/24/2011.  FINDINGS: 4.9 mm nodule inferior lateral right middle lobe (series 3, image 9). If the patient is at high risk for bronchogenic carcinoma, follow-up chest CT at 1 year is recommended. If the patient is at low risk, no follow-up is needed. This recommendation follows the consensus statement: Guidelines for Management of Small Pulmonary Nodules Detected on CT Scans: A Statement from the [place] as published in Radiology 2005; 237:395-400.  Coronary artery  calcifications.  Heart size within normal limits.  Atherosclerotic type changes aorta with prominent  plaque. Moderate narrowing lower abdominal aorta. Atherosclerotic type changes with prominent calcified plaque common iliac arteries with narrowing greater on the right.  Atherosclerotic type changes aortic branch vessels including markedly ectatic splenic artery.  Small hiatal hernia. Stomach under distended without gross abnormality noted.  No inflammation is noted surrounding the appendix. There is however, mild inflammation along the mesenteric of distal ileum involving the mesenteric vessels. Question branch vessel thrombosis. This does not appear to involve the bowel loop itself.  No free intraperitoneal air free fluid.  Gallstones without CT evidence cholecystitis. If this is of concern ultrasound may be considered for further delineation.  Bilateral renal low-density structures. Largest on the left is 5 cm sinuses. Some of the small low-density structures cannot be confirmed as simple because of their small size. Vascular calcifications versus nonobstructing renal calculi.  Taking into account limitation by non contrast imaging, no worrisome hepatic, splenic, pancreatic or adrenal lesion.  No bony destructive lesion.  IMPRESSION: Mild inflammation along the mesenteric of distal ileum involving the mesenteric vessels. Question branch vessel thrombosis. This does not appear to involve the bowel loop itself.  Please see above for further additional findings/detail.   Electronically Signed   By: Chauncey Cruel M.D.   On: 06/16/2013 00:33  I personally reviewed the imaging tests through PACS system I reviewed available ER/hospitalization records through the EMR    EKG Interpretation None      MDM   Final diagnoses:  None    Concerning for possible infected ureteral stone.  CT scan to define GU tract.  Urine pending.  Oral temp of 100.2. labs pending.  Pain free at this time  12:48 AM Patient feels  much better at this time.  I suspect that she is left-sided pyelonephritis.  I suspect that she had an obstructing ureteral stone that has now passed since her pain significantly improved on arrival of emergency department.  Overall she is well appearing.  Abdominal exam is benign.  Patient lives at home with her daughter.  I had along discussion with the patient and the patient's family and they understand to return to the ER for new or worsening symptoms.  Her with antibiotics and nausea medicine.  A short course of pain medicine.  At this time she states her pain is significantly better.  She's had a mild headache at this time.  She'll be given a dose of Tylenol because she does not want anything stronger.    Hoy Morn, MD 06/16/13 [phone]

## 2013-06-15 NOTE — ED Notes (Signed)
Pt states her pain starts in her back and feels like a vice and then comes to her sides and to the lower part of her stomach and out of her vagina.

## 2013-06-16 MED ORDER — ONDANSETRON 8 MG PO TBDP: 8.0000 mg | ORAL_TABLET | Freq: Three times a day (TID) | ORAL | Status: DC | PRN

## 2013-06-16 MED ORDER — CEPHALEXIN 500 MG PO CAPS: 500.0000 mg | ORAL_CAPSULE | Freq: Four times a day (QID) | ORAL | Status: DC

## 2013-06-16 MED ORDER — DEXTROSE 5 % IV SOLN
1.0000 g | Freq: Once | INTRAVENOUS | Status: AC
  Administered 2013-06-16: 1 g via INTRAVENOUS
  Filled 2013-06-16: qty 10

## 2013-06-16 MED ORDER — HYDROCODONE-ACETAMINOPHEN 5-325 MG PO TABS: 1.0000 | ORAL_TABLET | ORAL | Status: DC | PRN

## 2013-06-16 MED ORDER — ACETAMINOPHEN 500 MG PO TABS
1000.0000 mg | ORAL_TABLET | Freq: Once | ORAL | Status: AC
  Administered 2013-06-16: 1000 mg via ORAL
  Filled 2013-06-16: qty 2

## 2013-06-16 NOTE — Discharge Instructions (Signed)

## 2013-07-22 ENCOUNTER — Emergency Department (HOSPITAL_COMMUNITY): Payer: Medicare Other

## 2013-07-22 ENCOUNTER — Emergency Department (HOSPITAL_COMMUNITY)
Admission: EM | Admit: 2013-07-22 | Discharge: 2013-07-22 | Disposition: A | Discharge status: Home / Self Care | Payer: Medicare Other | Attending: Emergency Medicine | Admitting: Emergency Medicine

## 2013-07-22 ENCOUNTER — Encounter (HOSPITAL_COMMUNITY): Payer: Self-pay | Admitting: Emergency Medicine

## 2013-07-22 DIAGNOSIS — E785 Hyperlipidemia, unspecified: Secondary | ICD-10-CM | POA: Insufficient documentation

## 2013-07-22 DIAGNOSIS — N390 Urinary tract infection, site not specified: Secondary | ICD-10-CM | POA: Insufficient documentation

## 2013-07-22 DIAGNOSIS — Z79899 Other long term (current) drug therapy: Secondary | ICD-10-CM | POA: Insufficient documentation

## 2013-07-22 DIAGNOSIS — Z8619 Personal history of other infectious and parasitic diseases: Secondary | ICD-10-CM | POA: Insufficient documentation

## 2013-07-22 DIAGNOSIS — K219 Gastro-esophageal reflux disease without esophagitis: Secondary | ICD-10-CM | POA: Insufficient documentation

## 2013-07-22 DIAGNOSIS — E119 Type 2 diabetes mellitus without complications: Secondary | ICD-10-CM | POA: Insufficient documentation

## 2013-07-22 DIAGNOSIS — Z8673 Personal history of transient ischemic attack (TIA), and cerebral infarction without residual deficits: Secondary | ICD-10-CM | POA: Insufficient documentation

## 2013-07-22 DIAGNOSIS — I129 Hypertensive chronic kidney disease with stage 1 through stage 4 chronic kidney disease, or unspecified chronic kidney disease: Secondary | ICD-10-CM | POA: Insufficient documentation

## 2013-07-22 DIAGNOSIS — I2510 Atherosclerotic heart disease of native coronary artery without angina pectoris: Secondary | ICD-10-CM | POA: Insufficient documentation

## 2013-07-22 DIAGNOSIS — Z791 Long term (current) use of non-steroidal anti-inflammatories (NSAID): Secondary | ICD-10-CM | POA: Insufficient documentation

## 2013-07-22 DIAGNOSIS — N281 Cyst of kidney, acquired: Secondary | ICD-10-CM | POA: Insufficient documentation

## 2013-07-22 DIAGNOSIS — Z862 Personal history of diseases of the blood and blood-forming organs and certain disorders involving the immune mechanism: Secondary | ICD-10-CM | POA: Insufficient documentation

## 2013-07-22 DIAGNOSIS — Z8541 Personal history of malignant neoplasm of cervix uteri: Secondary | ICD-10-CM | POA: Insufficient documentation

## 2013-07-22 DIAGNOSIS — N289 Disorder of kidney and ureter, unspecified: Secondary | ICD-10-CM | POA: Insufficient documentation

## 2013-07-22 DIAGNOSIS — N181 Chronic kidney disease, stage 1: Secondary | ICD-10-CM | POA: Insufficient documentation

## 2013-07-22 DIAGNOSIS — D72829 Elevated white blood cell count, unspecified: Secondary | ICD-10-CM | POA: Insufficient documentation

## 2013-07-22 DIAGNOSIS — Z9861 Coronary angioplasty status: Secondary | ICD-10-CM | POA: Insufficient documentation

## 2013-07-22 DIAGNOSIS — R634 Abnormal weight loss: Secondary | ICD-10-CM | POA: Insufficient documentation

## 2013-07-22 DIAGNOSIS — Z8709 Personal history of other diseases of the respiratory system: Secondary | ICD-10-CM | POA: Insufficient documentation

## 2013-07-22 DIAGNOSIS — Z7982 Long term (current) use of aspirin: Secondary | ICD-10-CM | POA: Insufficient documentation

## 2013-07-22 DIAGNOSIS — R319 Hematuria, unspecified: Secondary | ICD-10-CM

## 2013-07-22 DIAGNOSIS — E7800 Pure hypercholesterolemia, unspecified: Secondary | ICD-10-CM | POA: Insufficient documentation

## 2013-07-22 DIAGNOSIS — Z880 Allergy status to penicillin: Secondary | ICD-10-CM | POA: Insufficient documentation

## 2013-07-22 DIAGNOSIS — F17200 Nicotine dependence, unspecified, uncomplicated: Secondary | ICD-10-CM | POA: Insufficient documentation

## 2013-07-22 LAB — COMPREHENSIVE METABOLIC PANEL
ALK PHOS: 77 U/L (ref 39–117)
ALT: 12 U/L (ref 0–35)
AST: 19 U/L (ref 0–37)
Albumin: 3.4 g/dL — ABNORMAL LOW (ref 3.5–5.2)
BILIRUBIN TOTAL: 0.3 mg/dL (ref 0.3–1.2)
BUN: 23 mg/dL (ref 6–23)
CHLORIDE: 105 meq/L (ref 96–112)
CO2: 22 meq/L (ref 19–32)
Calcium: 9.3 mg/dL (ref 8.4–10.5)
Creatinine, Ser: 1.39 mg/dL — ABNORMAL HIGH (ref 0.50–1.10)
GFR, EST AFRICAN AMERICAN: 44 mL/min — AB (ref >90)
GFR, EST NON AFRICAN AMERICAN: 38 mL/min — AB (ref >90)
GLUCOSE: 112 mg/dL — AB (ref 70–99)
POTASSIUM: 4.4 meq/L (ref 3.7–5.3)
SODIUM: 141 meq/L (ref 137–147)
Total Protein: 6.5 g/dL (ref 6.0–8.3)

## 2013-07-22 LAB — CBC WITH DIFFERENTIAL/PLATELET
BASOS ABS: 0 10*3/uL (ref 0.0–0.1)
BASOS PCT: 0 % (ref 0–1)
EOS ABS: 0.2 10*3/uL (ref 0.0–0.7)
Eosinophils Relative: 1 % (ref 0–5)
HCT: 37.7 % (ref 36.0–46.0)
HEMOGLOBIN: 12.4 g/dL (ref 12.0–15.0)
Lymphocytes Relative: 13 % (ref 12–46)
Lymphs Abs: 2.1 10*3/uL (ref 0.7–4.0)
MCH: 27.6 pg (ref 26.0–34.0)
MCHC: 32.9 g/dL (ref 30.0–36.0)
MCV: 83.8 fL (ref 78.0–100.0)
MONOS PCT: 6 % (ref 3–12)
Monocytes Absolute: 0.9 10*3/uL (ref 0.1–1.0)
NEUTROS ABS: 12.3 10*3/uL — AB (ref 1.7–7.7)
NEUTROS PCT: 79 % — AB (ref 43–77)
PLATELETS: 249 10*3/uL (ref 150–400)
RBC: 4.5 MIL/uL (ref 3.87–5.11)
RDW: 13.4 % (ref 11.5–15.5)
WBC: 15.6 10*3/uL — ABNORMAL HIGH (ref 4.0–10.5)

## 2013-07-22 LAB — URINALYSIS, ROUTINE W REFLEX MICROSCOPIC
BILIRUBIN URINE: NEGATIVE
GLUCOSE, UA: NEGATIVE mg/dL
Ketones, ur: NEGATIVE mg/dL
Nitrite: NEGATIVE
Protein, ur: 100 mg/dL — AB
SPECIFIC GRAVITY, URINE: 1.016 (ref 1.005–1.030)
UROBILINOGEN UA: 0.2 mg/dL (ref 0.0–1.0)
pH: 5.5 (ref 5.0–8.0)

## 2013-07-22 LAB — URINE MICROSCOPIC-ADD ON

## 2013-07-22 LAB — I-STAT CG4 LACTIC ACID, ED: Lactic Acid, Venous: 1.59 mmol/L (ref 0.5–2.2)

## 2013-07-22 LAB — LIPASE, BLOOD: LIPASE: 28 U/L (ref 11–59)

## 2013-07-22 MED ORDER — CIPROFLOXACIN HCL 500 MG PO TABS: 500.0000 mg | ORAL_TABLET | Freq: Two times a day (BID) | ORAL | Status: DC

## 2013-07-22 MED ORDER — CIPROFLOXACIN HCL 500 MG PO TABS: 500.0000 mg | ORAL_TABLET | Freq: Once | ORAL | Status: DC

## 2013-07-22 MED ORDER — ONDANSETRON HCL 4 MG/2ML IJ SOLN
4.0000 mg | Freq: Once | INTRAMUSCULAR | Status: AC
  Administered 2013-07-22: 4 mg via INTRAVENOUS
  Filled 2013-07-22: qty 2

## 2013-07-22 MED ORDER — DEXTROSE 5 % IV SOLN
1.0000 g | Freq: Once | INTRAVENOUS | Status: AC
  Administered 2013-07-22: 1 g via INTRAVENOUS
  Filled 2013-07-22: qty 10

## 2013-07-22 MED ORDER — NAPROXEN 500 MG PO TABS: 500.0000 mg | ORAL_TABLET | Freq: Two times a day (BID) | ORAL | Status: DC

## 2013-07-22 MED ORDER — SODIUM CHLORIDE 0.9 % IV BOLUS (SEPSIS)
500.0000 mL | Freq: Once | INTRAVENOUS | Status: AC
  Administered 2013-07-22: 500 mL via INTRAVENOUS

## 2013-07-22 MED ORDER — IOHEXOL 300 MG/ML  SOLN: 50.0000 mL | Freq: Once | INTRAMUSCULAR | Status: DC | PRN

## 2013-07-22 MED ORDER — MORPHINE SULFATE 4 MG/ML IJ SOLN
4.0000 mg | Freq: Once | INTRAMUSCULAR | Status: AC
  Administered 2013-07-22: 4 mg via INTRAVENOUS
  Filled 2013-07-22: qty 1

## 2013-07-22 MED ORDER — DIPHENHYDRAMINE HCL 50 MG/ML IJ SOLN
25.0000 mg | Freq: Once | INTRAMUSCULAR | Status: AC
  Administered 2013-07-22: 25 mg via INTRAVENOUS
  Filled 2013-07-22: qty 1

## 2013-07-22 NOTE — ED Notes (Addendum)
Pt reports hx of UTI. Pt treated for UTI on 06/15/13 at [facility]. Pt reports pain is similar. Pt was seen at urgent care today. Pt given Phenergan 25 mg and Toradol 30 mg at urgent care. Urgent care transferred pt here for possible kidney stone due to dysuria. Pt denies blood in urine. Pt denies pain at present time but reports severe time prior to medication administration at urgent care to left lower abdomen.

## 2013-07-22 NOTE — ED Notes (Signed)
Pt c/o bilateral flank pain; lower abd pain; seen at Urgent Care and given pain med--toradol and phenergan; states had same symptoms 3/4 and treated at [facility] for UTI--placed on 10 days of antibiotic with a clean repeat u/a; states woke up this morning nauseated/vomiting

## 2013-07-22 NOTE — ED Provider Notes (Signed)
CSN: [id number]     Arrival date & time 07/22/13  1414 History   First MD Initiated Contact with Patient 07/22/13 1442     Chief Complaint  Patient presents with  . Flank Pain     (Consider location/radiation/quality/duration/timing/severity/associated sxs/prior Treatment) HPI 68 yo female with recent hx of UTI approximately 1 month ago treated with keflex and improved. Patient now back with similar episode, was seen by UC earlier today and given toradol and pheneregan for suspected kidney stone and advised to come to ED. Patient states pain started this morning. Patient describes dull, achy 10/10 pain in left lower back that radiates around left side to the groin and is noted to be constant. Patient pain currently controlled after tx at [facility]. Better after urination. Patient admits to associated Urinary frequency, urgency, nausea, vomiting x 2, and LLQ abdominal pain. Admits to 10 lb weight loss since January with poor appetite. Admits to hx of cervical cancer back in 1985 treated with conization. Last papsmear 1 year ago and noted to be normal. Recent wellness exam in January without any abnormalities noted. Denies diarrhea, constipation, hematuria, vaginal bleeding. PMH significant for HTN, DM,high cholesterol, stroke, CAD.   Past Medical History  Diagnosis Date  . Hypertension   . Diabetes mellitus   . High cholesterol   . GERD (gastroesophageal reflux disease)   . Stroke 2007 affected R side  . Vertigo now improved  . Poliomyelitis since 1948    has drop foot on R  . Polio 1953  . Cancer 1985    cervical  . Coronary artery disease   . Bronchitis     "have had a couple of times in my lifetime"  . Hyperlipidemia   . CKD (chronic kidney disease) stage 1, GFR 90 ml/min or greater was told by her PCP    Intolerant to ACE according to her PCP  . Blood transfusion   . Anemia   . Hemorrhoids    Past Surgical History  Procedure Laterality Date  . Carotid endarterectomy  2000 L side  .  Coronary angioplasty  stented 2007  . Tonsillectomy  1954  . Foot reconstructjion  1953    right foot; post polio  . Cervical conization w/bx  1985  . Dilation and curettage of uterus  1980's   Family History  Problem Relation Age of Onset  . Coronary artery disease Mother   . Diabetes type II Father   . Cancer Father   . Brain cancer Father   . Cancer Sister   . Coronary artery disease Brother    History  Substance Use Topics  . Smoking status: Current Every Day Smoker -- 0.25 packs/day for 40 years    Types: Cigarettes  . Smokeless tobacco: Never Used     Comment: "working hard to quit"  . Alcohol Use: No   OB History   Grav Para Term Preterm Abortions TAB SAB Ect Mult Living                 Review of Systems  Constitutional: Positive for unexpected weight change.  Gastrointestinal: Negative for blood in stool.  Genitourinary: Negative for dysuria, vaginal bleeding, vaginal discharge, vaginal pain and menstrual problem.  All other systems reviewed and are negative.     Allergies  Penicillins  Home Medications   Current Outpatient Rx  Name  Route  Sig  Dispense  Refill  . aspirin 81 MG chewable tablet   Oral   Chew 81 mg by  mouth 2 (two) times daily.         Marland Kitchen doxazosin (CARDURA) 4 MG tablet   Oral   Take 4 mg by mouth at bedtime.           . famotidine (PEPCID) 20 MG tablet   Oral   Take 40 mg by mouth 2 (two) times daily.           Marland Kitchen lisinopril (PRINIVIL,ZESTRIL) 20 MG tablet   Oral   Take 20 mg by mouth daily.         Marland Kitchen loratadine (CLARITIN) 10 MG tablet   Oral   Take 10 mg by mouth daily.         . metFORMIN (GLUCOPHAGE-XR) 500 MG 24 hr tablet   Oral   Take 500 mg by mouth 2 (two) times daily.           . ondansetron (ZOFRAN ODT) 8 MG disintegrating tablet   Oral   Take 1 tablet (8 mg total) by mouth every 8 (eight) hours as needed for nausea or vomiting.   10 tablet   0   . pravastatin (PRAVACHOL) 40 MG tablet   Oral    Take 40 mg by mouth at bedtime.          Marland Kitchen EXPIRED: amLODipine (NORVASC) 10 MG tablet   Oral   Take 1 tablet (10 mg total) by mouth daily.   30 tablet   2   . ciprofloxacin (CIPRO) 500 MG tablet   Oral   Take 1 tablet (500 mg total) by mouth 2 (two) times daily. One po bid x 7 days   14 tablet   0   . EXPIRED: hydrochlorothiazide (HYDRODIURIL) 12.5 MG tablet   Oral   Take 1 tablet (12.5 mg total) by mouth daily.   30 tablet   2   . naproxen (NAPROSYN) 500 MG tablet   Oral   Take 1 tablet (500 mg total) by mouth 2 (two) times daily with a meal.   30 tablet   0    BP 146/65  Pulse 66  Temp(Src) 97.4 F (36.3 C) (Oral)  Resp 16  SpO2 97% Physical Exam  Nursing note and vitals reviewed. Constitutional: She is oriented to person, place, and time. She appears well-developed and well-nourished. No distress.  HENT:  Head: Normocephalic and atraumatic.  Eyes: Conjunctivae are normal. No scleral icterus.  Neck: No JVD present. No tracheal deviation present.  Cardiovascular: Normal rate and regular rhythm.  Exam reveals no gallop and no friction rub.   No murmur heard. Pulmonary/Chest: Effort normal and breath sounds normal. No respiratory distress. She has no wheezes. She has no rhonchi. She has no rales.  Abdominal: Soft. Normal appearance and bowel sounds are normal. She exhibits no distension and no mass. There is no hepatosplenomegaly. There is tenderness in the suprapubic area and left lower quadrant. There is no rigidity, no rebound, no guarding, no tenderness at McBurney's point and negative Murphy's sign.    Musculoskeletal: She exhibits no edema.  Neurological: She is alert and oriented to person, place, and time.  Skin: Skin is warm and dry. No rash noted. She is not diaphoretic.  Psychiatric: She has a normal mood and affect. Her behavior is normal.    ED Course  Procedures (including critical care time) Labs Review Labs Reviewed  URINALYSIS, ROUTINE W  REFLEX MICROSCOPIC - Abnormal; Notable for the following:    APPearance TURBID (*)    Hgb urine dipstick  LARGE (*)    Protein, ur 100 (*)    Leukocytes, UA LARGE (*)    All other components within normal limits  COMPREHENSIVE METABOLIC PANEL - Abnormal; Notable for the following:    Glucose, Bld 112 (*)    Creatinine, Ser 1.39 (*)    Albumin 3.4 (*)    GFR calc non Af Amer 38 (*)    GFR calc Af Amer 44 (*)    All other components within normal limits  CBC WITH DIFFERENTIAL - Abnormal; Notable for the following:    WBC 15.6 (*)    Neutrophils Relative % 79 (*)    Neutro Abs 12.3 (*)    All other components within normal limits  URINE MICROSCOPIC-ADD ON - Abnormal; Notable for the following:    Bacteria, UA FEW (*)    All other components within normal limits  URINE CULTURE  LIPASE, BLOOD  I-STAT CG4 LACTIC ACID, ED   Imaging Review US Renal  07/22/2013   CLINICAL DATA:  Left flank pain.  EXAM: RENAL/URINARY TRACT ULTRASOUND COMPLETE  COMPARISON:  CT ABD/PELV WO CM dated 06/15/2013  FINDINGS: Right Kidney:  Length: 9.7 cm. Two simple cysts of the right kidney, 1 measuring 1.5 x 1.5 x 1.4 cm and the other 2.0 x 1.7 x 1.5 cm. Echogenicity normal. No hydronephrosis 2  Left Kidney:  Length: 9.2 cm. 4.5 x 3.9 x 5.0 cm cyst noted in the upper pole. This appears simple. There is a adjacent 2.5 x 2.4 x 2.6 cm hyperechoic mass. Renal MRI suggested for further evaluation.  Bladder:  Appears normal for degree of bladder distention.  IMPRESSION: 1. Hypoechoic solid lesion measuring 2.5 x 2.4 x 2.6 cm left kidney. Renal MRI suggest for further evaluation. 2. Bilateral simple renal cysts.   Electronically Signed   By: Marcello Moores  Register   On: 07/22/2013 16:20     EKG Interpretation None      MDM   Final diagnoses:  UTI (urinary tract infection)  Hematuria  Renal cyst  Renal lesion  Leukocytosis    Patient presents to ED with c/o left flank pain and LLQ pain.. Patient afebrile, with elevated  BP to 183/69 Filed Vitals:   07/22/13 1426 07/22/13 1753 07/22/13 1904  BP: 183/69 146/65   Pulse: 73 73 66  Temp: 99 F (37.2 C)  97.4 F (36.3 C)  TempSrc:   Oral  Resp: 20 14 16   SpO2: 98% 100% 97%   Concern for Pyelo, Diverticulitis, Mesenteric ischemia, ureterollithiasis.  Doubt Mesenteric ischemia due to hx and localized pain on exam.  No evidence of hydronephrosis, doubt ureterolithiasis. Doubt diverticulitits. Possible Pyelo with evidence of UTI today on UA, plan to treat with Cipro and have follow up with Urology for renal cysts and hypoechoic lesion found on Ultrasound.  Patient reevaluated and appears Improved with tx in ED. Discussed findings with patient and family.  Plan to tx patient with NSAIDs and Cipro. Plan to have patient follow up with Urology for follow up MRI on renal lesion.  Return precautions given for any worsening sxs and advised to return immediately if develops worsenign pain or difficulty urinating.  Patient confirms understanding.    Meds given in ED:  Medications  sodium chloride 0.9 % bolus 500 mL (0 mLs Intravenous Stopped 07/22/13 1628)  morphine 4 MG/ML injection 4 mg (4 mg Intravenous Given 07/22/13 1533)  ondansetron (ZOFRAN) injection 4 mg (4 mg Intravenous Given 07/22/13 1533)  cefTRIAXone (ROCEPHIN) 1 g in dextrose 5 %  50 mL IVPB (0 g Intravenous Stopped 07/22/13 1907)  diphenhydrAMINE (BENADRYL) injection 25 mg (25 mg Intravenous Given 07/22/13 1812)    Discharge Medication List as of 07/22/2013  5:56 PM    START taking these medications   Details  ciprofloxacin (CIPRO) 500 MG tablet Take 1 tablet (500 mg total) by mouth 2 (two) times daily. One po bid x 7 days, Starting 07/22/2013, Until Discontinued, Print    naproxen (NAPROSYN) 500 MG tablet Take 1 tablet (500 mg total) by mouth 2 (two) times daily with a meal., Starting 07/22/2013, Until Discontinued, Print           Dossie Arbour [place], [place] 07/23/13 [id number]

## 2013-07-22 NOTE — Discharge Instructions (Signed)
Follow up with [facility] as listed above for follow up MRI for further evaluation of Renal lesion found on ultrasound.  Follow up with your primary doctor in 2 days for recheck of UTI symptoms. Take Cipro as directed for urinary tract infection (UTI). Return to Emergency department if you develop any worsening pain or difficulty urinating.    Urinary Tract Infection Urinary tract infections (UTIs) can develop anywhere along your urinary tract. Your urinary tract is your body's drainage system for removing wastes and extra water. Your urinary tract includes two kidneys, two ureters, a bladder, and a urethra. Your kidneys are a pair of bean-shaped organs. Each kidney is about the size of your fist. They are located below your ribs, one on each side of your spine. CAUSES Infections are caused by microbes, which are microscopic organisms, including fungi, viruses, and bacteria. These organisms are so small that they can only be seen through a microscope. Bacteria are the microbes that most commonly cause UTIs. SYMPTOMS  Symptoms of UTIs may vary by age and gender of the patient and by the location of the infection. Symptoms in young women typically include a frequent and intense urge to urinate and a painful, burning feeling in the bladder or urethra during urination. Older women and men are more likely to be tired, shaky, and weak and have muscle aches and abdominal pain. A fever may mean the infection is in your kidneys. Other symptoms of a kidney infection include pain in your back or sides below the ribs, nausea, and vomiting. DIAGNOSIS To diagnose a UTI, your caregiver will ask you about your symptoms. Your caregiver also will ask to provide a urine sample. The urine sample will be tested for bacteria and white blood cells. White blood cells are made by your body to help fight infection. TREATMENT  Typically, UTIs can be treated with medication. Because most UTIs are caused by a bacterial  infection, they usually can be treated with the use of antibiotics. The choice of antibiotic and length of treatment depend on your symptoms and the type of bacteria causing your infection. HOME CARE INSTRUCTIONS  If you were prescribed antibiotics, take them exactly as your caregiver instructs you. Finish the medication even if you feel better after you have only taken some of the medication.  Drink enough water and fluids to keep your urine clear or pale yellow.  Avoid caffeine, tea, and carbonated beverages. They tend to irritate your bladder.  Empty your bladder often. Avoid holding urine for long periods of time.  Empty your bladder before and after sexual intercourse.  After a bowel movement, women should cleanse from front to back. Use each tissue only once. SEEK MEDICAL CARE IF:   You have back pain.  You develop a fever.  Your symptoms do not begin to resolve within 3 days. SEEK IMMEDIATE MEDICAL CARE IF:   You have severe back pain or lower abdominal pain.  You develop chills.  You have nausea or vomiting.  You have continued burning or discomfort with urination. MAKE SURE YOU:   Understand these instructions.  Will watch your condition.  Will get help right away if you are not doing well or get worse. Document Released: 01/08/2005 Document Revised: 09/30/2011 Document Reviewed: 05/09/2011 [facility] Patient Information 2014 [place].

## 2013-07-22 NOTE — Progress Notes (Signed)
   CARE MANAGEMENT ED NOTE 07/22/2013  Patient:  Paula Watson, Paula Watson   Account Number:  [id number]  Date Initiated:  07/22/2013  Documentation initiated by:  Jackelyn Poling  Subjective/Objective Assessment:   68 yr old [facility] [place] complete covered [place] pt c/o bilateral flank pain; lower abd pain; seen at Urgent Care 7 given pain med--toradol and phenergan;     Subjective/Objective Assessment Detail:   pcp per pt is angel jones     Action/Plan:   epic updated   Action/Plan Detail:   Anticipated DC Date:  07/22/2013     Status Recommendation to Physician:   Result of Recommendation:    Other ED [place]  PCP issues  Other  Outpatient Services - Pt will follow up    Choice offered to / List presented to:            Status of service:  Completed, signed off  ED Comments:   ED Comments Detail:

## 2013-07-22 NOTE — ED Notes (Signed)
Pt made aware of need for urine specimen 

## 2013-07-23 NOTE — ED Provider Notes (Signed)
Medical screening examination/treatment/procedure(s) were conducted as a shared visit with non-physician practitioner(s) and myself.  I personally evaluated the patient during the encounter.   EKG Interpretation None      Paula Watson is a 68 y.o. female hx of pyelo a month ago here with L flank pain. L flank pain today. Went to urgent care, UA + hematuria so sent to the ED for r/o stone. CT a month ago showed no stone and UA here showed blood and leuks. + L CVAT and LLQ tenderness. WBC 15, US showed cyst L kidney. I think she likely has pyelo. Cyst in kidney explains hematuria. Lactate nl so I doubt mesenteric ischemia. I doubt diverticulitis. Given ceftriaxone in the ED, will d/c home on cipro. Urine culture sent. Recommend urology f/u.    Wandra Arthurs, MD 07/23/13 1052

## 2013-07-24 LAB — URINE CULTURE: Colony Count: 55000

## 2013-08-03 ENCOUNTER — Other Ambulatory Visit (HOSPITAL_COMMUNITY): Payer: Self-pay | Admitting: Urology

## 2013-08-03 DIAGNOSIS — N2889 Other specified disorders of kidney and ureter: Secondary | ICD-10-CM

## 2013-09-06 ENCOUNTER — Ambulatory Visit (HOSPITAL_COMMUNITY)
Admission: RE | Admit: 2013-09-06 | Discharge: 2013-09-06 | Disposition: A | Discharge status: Home / Self Care | Payer: Medicare Other | Source: Ambulatory Visit | Attending: Urology | Admitting: Urology

## 2013-09-06 DIAGNOSIS — Q618 Other cystic kidney diseases: Secondary | ICD-10-CM | POA: Insufficient documentation

## 2013-09-06 DIAGNOSIS — N2889 Other specified disorders of kidney and ureter: Secondary | ICD-10-CM

## 2013-09-06 DIAGNOSIS — R109 Unspecified abdominal pain: Secondary | ICD-10-CM | POA: Insufficient documentation

## 2013-09-06 LAB — POCT I-STAT CREATININE: CREATININE: 1.5 mg/dL — AB (ref 0.50–1.10)

## 2013-09-06 MED ORDER — GADOBENATE DIMEGLUMINE 529 MG/ML IV SOLN
7.0000 mL | Freq: Once | INTRAVENOUS | Status: AC | PRN
  Administered 2013-09-06: 7 mL via INTRAVENOUS

## 2015-01-24 ENCOUNTER — Other Ambulatory Visit (HOSPITAL_COMMUNITY): Payer: Self-pay | Admitting: Interventional Radiology

## 2015-01-24 DIAGNOSIS — I771 Stricture of artery: Secondary | ICD-10-CM

## 2015-02-02 ENCOUNTER — Ambulatory Visit (HOSPITAL_COMMUNITY): Payer: Self-pay

## 2015-02-12 ENCOUNTER — Ambulatory Visit (HOSPITAL_COMMUNITY)
Admission: RE | Admit: 2015-02-12 | Discharge: 2015-02-12 | Disposition: A | Discharge status: Home / Self Care | Payer: Medicare Other | Source: Ambulatory Visit | Attending: Interventional Radiology | Admitting: Interventional Radiology

## 2015-02-12 DIAGNOSIS — I771 Stricture of artery: Secondary | ICD-10-CM

## 2015-02-20 ENCOUNTER — Other Ambulatory Visit (HOSPITAL_COMMUNITY): Payer: Self-pay | Admitting: Interventional Radiology

## 2015-02-20 DIAGNOSIS — I771 Stricture of artery: Secondary | ICD-10-CM

## 2015-02-20 DIAGNOSIS — I729 Aneurysm of unspecified site: Secondary | ICD-10-CM

## 2015-03-12 ENCOUNTER — Ambulatory Visit (HOSPITAL_COMMUNITY)
Admission: RE | Admit: 2015-03-12 | Discharge: 2015-03-12 | Disposition: A | Discharge status: Home / Self Care | Payer: Medicare Other | Source: Ambulatory Visit | Attending: Interventional Radiology | Admitting: Interventional Radiology

## 2015-03-12 ENCOUNTER — Other Ambulatory Visit (HOSPITAL_COMMUNITY): Payer: Self-pay | Admitting: Interventional Radiology

## 2015-03-12 DIAGNOSIS — I729 Aneurysm of unspecified site: Secondary | ICD-10-CM

## 2015-03-12 DIAGNOSIS — I739 Peripheral vascular disease, unspecified: Secondary | ICD-10-CM | POA: Insufficient documentation

## 2015-03-12 DIAGNOSIS — I720 Aneurysm of carotid artery: Secondary | ICD-10-CM | POA: Diagnosis not present

## 2015-03-12 DIAGNOSIS — I771 Stricture of artery: Secondary | ICD-10-CM

## 2015-03-12 DIAGNOSIS — G319 Degenerative disease of nervous system, unspecified: Secondary | ICD-10-CM | POA: Insufficient documentation

## 2015-03-12 DIAGNOSIS — I638 Other cerebral infarction: Secondary | ICD-10-CM | POA: Insufficient documentation

## 2015-03-12 LAB — POCT I-STAT CREATININE: Creatinine, Ser: 1.4 mg/dL — ABNORMAL HIGH (ref 0.44–1.00)

## 2015-03-22 ENCOUNTER — Other Ambulatory Visit (HOSPITAL_COMMUNITY): Payer: Self-pay | Admitting: Interventional Radiology

## 2015-03-22 DIAGNOSIS — I729 Aneurysm of unspecified site: Secondary | ICD-10-CM

## 2015-03-22 DIAGNOSIS — I771 Stricture of artery: Secondary | ICD-10-CM

## 2015-03-30 ENCOUNTER — Emergency Department (HOSPITAL_COMMUNITY)
Admission: EM | Admit: 2015-03-30 | Discharge: 2015-03-30 | Disposition: A | Discharge status: Home / Self Care | Payer: Medicare Other | Attending: Emergency Medicine | Admitting: Emergency Medicine

## 2015-03-30 ENCOUNTER — Encounter (HOSPITAL_COMMUNITY): Payer: Self-pay | Admitting: Emergency Medicine

## 2015-03-30 DIAGNOSIS — Z8612 Personal history of poliomyelitis: Secondary | ICD-10-CM | POA: Insufficient documentation

## 2015-03-30 DIAGNOSIS — Z7984 Long term (current) use of oral hypoglycemic drugs: Secondary | ICD-10-CM | POA: Insufficient documentation

## 2015-03-30 DIAGNOSIS — R112 Nausea with vomiting, unspecified: Secondary | ICD-10-CM | POA: Diagnosis not present

## 2015-03-30 DIAGNOSIS — Z791 Long term (current) use of non-steroidal anti-inflammatories (NSAID): Secondary | ICD-10-CM | POA: Diagnosis not present

## 2015-03-30 DIAGNOSIS — Z8673 Personal history of transient ischemic attack (TIA), and cerebral infarction without residual deficits: Secondary | ICD-10-CM | POA: Insufficient documentation

## 2015-03-30 DIAGNOSIS — Z8541 Personal history of malignant neoplasm of cervix uteri: Secondary | ICD-10-CM | POA: Diagnosis not present

## 2015-03-30 DIAGNOSIS — Z7982 Long term (current) use of aspirin: Secondary | ICD-10-CM | POA: Insufficient documentation

## 2015-03-30 DIAGNOSIS — N181 Chronic kidney disease, stage 1: Secondary | ICD-10-CM | POA: Diagnosis not present

## 2015-03-30 DIAGNOSIS — F17210 Nicotine dependence, cigarettes, uncomplicated: Secondary | ICD-10-CM | POA: Diagnosis not present

## 2015-03-30 DIAGNOSIS — I129 Hypertensive chronic kidney disease with stage 1 through stage 4 chronic kidney disease, or unspecified chronic kidney disease: Secondary | ICD-10-CM | POA: Diagnosis not present

## 2015-03-30 DIAGNOSIS — K219 Gastro-esophageal reflux disease without esophagitis: Secondary | ICD-10-CM | POA: Diagnosis not present

## 2015-03-30 DIAGNOSIS — Z79899 Other long term (current) drug therapy: Secondary | ICD-10-CM | POA: Insufficient documentation

## 2015-03-30 DIAGNOSIS — E7800 Pure hypercholesterolemia, unspecified: Secondary | ICD-10-CM | POA: Diagnosis not present

## 2015-03-30 DIAGNOSIS — E785 Hyperlipidemia, unspecified: Secondary | ICD-10-CM | POA: Insufficient documentation

## 2015-03-30 DIAGNOSIS — Z8709 Personal history of other diseases of the respiratory system: Secondary | ICD-10-CM | POA: Insufficient documentation

## 2015-03-30 DIAGNOSIS — I2510 Atherosclerotic heart disease of native coronary artery without angina pectoris: Secondary | ICD-10-CM | POA: Diagnosis not present

## 2015-03-30 DIAGNOSIS — R197 Diarrhea, unspecified: Secondary | ICD-10-CM

## 2015-03-30 DIAGNOSIS — E119 Type 2 diabetes mellitus without complications: Secondary | ICD-10-CM | POA: Insufficient documentation

## 2015-03-30 DIAGNOSIS — E860 Dehydration: Secondary | ICD-10-CM | POA: Insufficient documentation

## 2015-03-30 DIAGNOSIS — Z880 Allergy status to penicillin: Secondary | ICD-10-CM | POA: Insufficient documentation

## 2015-03-30 LAB — CBC WITH DIFFERENTIAL/PLATELET
Basophils Absolute: 0 10*3/uL (ref 0.0–0.1)
Basophils Relative: 0 %
Eosinophils Absolute: 0.3 10*3/uL (ref 0.0–0.7)
Eosinophils Relative: 2 %
HEMATOCRIT: 41.8 % (ref 36.0–46.0)
Hemoglobin: 13.4 g/dL (ref 12.0–15.0)
LYMPHS ABS: 1 10*3/uL (ref 0.7–4.0)
Lymphocytes Relative: 6 %
MCH: 28.3 pg (ref 26.0–34.0)
MCHC: 32.1 g/dL (ref 30.0–36.0)
MCV: 88.4 fL (ref 78.0–100.0)
Monocytes Absolute: 1.2 10*3/uL — ABNORMAL HIGH (ref 0.1–1.0)
Monocytes Relative: 7 %
Neutro Abs: 14.3 10*3/uL — ABNORMAL HIGH (ref 1.7–7.7)
Neutrophils Relative %: 85 %
Platelets: 234 10*3/uL (ref 150–400)
RBC: 4.73 MIL/uL (ref 3.87–5.11)
RDW: 13.5 % (ref 11.5–15.5)
WBC: 16.8 10*3/uL — ABNORMAL HIGH (ref 4.0–10.5)

## 2015-03-30 LAB — COMPREHENSIVE METABOLIC PANEL
ALK PHOS: 59 U/L (ref 38–126)
ALT: 13 U/L — ABNORMAL LOW (ref 14–54)
AST: 16 U/L (ref 15–41)
Albumin: 3.3 g/dL — ABNORMAL LOW (ref 3.5–5.0)
Anion gap: 6 (ref 5–15)
BUN: 20 mg/dL (ref 6–20)
CHLORIDE: 115 mmol/L — AB (ref 101–111)
CO2: 22 mmol/L (ref 22–32)
CREATININE: 1.37 mg/dL — AB (ref 0.44–1.00)
Calcium: 8.1 mg/dL — ABNORMAL LOW (ref 8.9–10.3)
GFR calc Af Amer: 44 mL/min — ABNORMAL LOW (ref >60)
GFR calc non Af Amer: 38 mL/min — ABNORMAL LOW (ref >60)
Glucose, Bld: 204 mg/dL — ABNORMAL HIGH (ref 65–99)
Potassium: 4.7 mmol/L (ref 3.5–5.1)
Sodium: 143 mmol/L (ref 135–145)
Total Bilirubin: 0.5 mg/dL (ref 0.3–1.2)
Total Protein: 5.9 g/dL — ABNORMAL LOW (ref 6.5–8.1)

## 2015-03-30 MED ORDER — ONDANSETRON HCL 4 MG/2ML IJ SOLN
4.0000 mg | Freq: Once | INTRAMUSCULAR | Status: AC
  Administered 2015-03-30: 4 mg via INTRAVENOUS
  Filled 2015-03-30: qty 2

## 2015-03-30 MED ORDER — ONDANSETRON 4 MG PREPACK (~~LOC~~): 1.0000 | ORAL_TABLET | Freq: Three times a day (TID) | ORAL | Status: DC | PRN

## 2015-03-30 MED ORDER — ACETAMINOPHEN 325 MG PO TABS
ORAL_TABLET | ORAL | Status: AC
  Filled 2015-03-30: qty 2

## 2015-03-30 MED ORDER — ONDANSETRON HCL 4 MG/2ML IJ SOLN
4.0000 mg | Freq: Once | INTRAMUSCULAR | Status: AC
  Administered 2015-03-30: 4 mg via INTRAMUSCULAR
  Filled 2015-03-30: qty 2

## 2015-03-30 MED ORDER — LOPERAMIDE HCL 2 MG PO CAPS
4.0000 mg | ORAL_CAPSULE | Freq: Once | ORAL | Status: AC
Start: 2015-03-30 — End: 2015-03-30
  Administered 2015-03-30: 4 mg via ORAL
  Filled 2015-03-30: qty 2

## 2015-03-30 MED ORDER — SODIUM CHLORIDE 0.9 % IV BOLUS (SEPSIS)
1000.0000 mL | Freq: Once | INTRAVENOUS | Status: AC
  Administered 2015-03-30: 1000 mL via INTRAVENOUS

## 2015-03-30 MED ORDER — ACETAMINOPHEN 325 MG PO TABS
650.0000 mg | ORAL_TABLET | Freq: Once | ORAL | Status: AC
  Administered 2015-03-30: 650 mg via ORAL

## 2015-03-30 NOTE — ED Notes (Signed)
MD Audie Pinto at bedside updating patient and family.

## 2015-03-30 NOTE — Discharge Instructions (Signed)

## 2015-03-30 NOTE — ED Provider Notes (Signed)
CSN: [id number]     Arrival date & time 03/30/15  0549 History   First MD Initiated Contact with Patient 03/30/15 0606     Chief Complaint  Patient presents with  . Diarrhea     (Consider location/radiation/quality/duration/timing/severity/associated sxs/prior Treatment) HPI patient reports she's had a cough for a few days. She's been taking over-the-counter cough medicine. Today at 2 AM she woke up and felt the need to have diarrhea. She went to the bathroom and had watery diarrhea and then started having vomiting. She has had multiple episodes of vomiting and diarrhea and now she is having dry heaves. She states the diarrhea continues to be watery. She denies any abdominal pain. She denies any fever. She denies eating anything that may have made her ill. She has not been around anybody else is ill. She has not been on antibiotics.  PCP Dr Arlis Porta  Past Medical History  Diagnosis Date  . Hypertension   . Diabetes mellitus   . High cholesterol   . GERD (gastroesophageal reflux disease)   . Stroke [facility]) 2007 affected R side  . Vertigo now improved  . Poliomyelitis since 1948    has drop foot on R  . Polio 1953  . Cancer [facility]) 1985    cervical  . Coronary artery disease   . Bronchitis     "have had a couple of times in my lifetime"  . Hyperlipidemia   . CKD (chronic kidney disease) stage 1, GFR 90 ml/min or greater was told by her PCP    Intolerant to ACE according to her PCP  . Blood transfusion   . Anemia   . Hemorrhoids    Past Surgical History  Procedure Laterality Date  . Carotid endarterectomy  2000 L side  . Coronary angioplasty  stented 2007  . Tonsillectomy  1954  . Foot reconstructjion  1953    right foot; post polio  . Cervical conization w/bx  1985  . Dilation and curettage of uterus  1980's   Family History  Problem Relation Age of Onset  . Coronary artery disease Mother   . Diabetes type II Father   . Cancer Father   . Brain cancer Father   . Cancer  Sister   . Coronary artery disease Brother    Social History  Substance Use Topics  . Smoking status: Current Every Day Smoker -- 0.25 packs/day for 40 years    Types: Cigarettes  . Smokeless tobacco: Never Used     Comment: "working hard to quit"  . Alcohol Use: No   Lives with sister  OB History    No data available     Review of Systems  All other systems reviewed and are negative.     Allergies  Penicillins  Home Medications   Prior to Admission medications   Medication Sig Start Date End Date Taking? Authorizing Provider  aspirin 81 MG chewable tablet Chew 81 mg by mouth 2 (two) times daily.   Yes Historical Provider, MD  carvedilol (COREG) 6.25 MG tablet Take 6.25 mg by mouth 2 (two) times daily with a meal.   Yes Historical Provider, MD  doxazosin (CARDURA) 4 MG tablet Take 4 mg by mouth at bedtime.     Yes Historical Provider, MD  famotidine (PEPCID) 20 MG tablet Take 40 mg by mouth 2 (two) times daily.     Yes Historical Provider, MD  lisinopril (PRINIVIL,ZESTRIL) 20 MG tablet Take 20 mg by mouth daily.   Yes  Historical Provider, MD  loratadine (CLARITIN) 10 MG tablet Take 10 mg by mouth daily.   Yes Historical Provider, MD  metFORMIN (GLUCOPHAGE-XR) 500 MG 24 hr tablet Take 500 mg by mouth 2 (two) times daily.     Yes Historical Provider, MD  pravastatin (PRAVACHOL) 40 MG tablet Take 40 mg by mouth at bedtime.    Yes Historical Provider, MD  amLODipine (NORVASC) 10 MG tablet Take 1 tablet (10 mg total) by mouth daily. 02/21/11 02/21/12  Oswald Hillock, MD  ciprofloxacin (CIPRO) 500 MG tablet Take 1 tablet (500 mg total) by mouth 2 (two) times daily. One po bid x 7 days 07/22/13   Maude Leriche, PA-C  hydrochlorothiazide (HYDRODIURIL) 12.5 MG tablet Take 1 tablet (12.5 mg total) by mouth daily. 02/21/11 02/21/12  Oswald Hillock, MD  naproxen (NAPROSYN) 500 MG tablet Take 1 tablet (500 mg total) by mouth 2 (two) times daily with a meal. 07/22/13   Maude Leriche, PA-C   ondansetron (ZOFRAN ODT) 8 MG disintegrating tablet Take 1 tablet (8 mg total) by mouth every 8 (eight) hours as needed for nausea or vomiting. 06/16/13   Jola Schmidt, MD   BP 127/53 mmHg  Pulse 72  Temp(Src) 97.5 F (36.4 C) (Oral)  Resp 20  Ht 5\' 2"  (1.575 m)  Wt 144 lb (65.318 kg)  BMI 26.33 kg/m2  SpO2 98%  Vital signs normal   Physical Exam  Constitutional: She is oriented to person, place, and time. She appears well-developed and well-nourished.  Non-toxic appearance. She does not appear ill. She appears distressed.  HENT:  Head: Normocephalic and atraumatic.  Right Ear: External ear normal.  Left Ear: External ear normal.  Nose: Nose normal. No mucosal edema or rhinorrhea.  Mouth/Throat: Mucous membranes are normal. No dental abscesses or uvula swelling.  Dry tongue  Eyes: Conjunctivae and EOM are normal. Pupils are equal, round, and reactive to light.  Neck: Normal range of motion and full passive range of motion without pain. Neck supple.  Cardiovascular: Normal rate, regular rhythm and normal heart sounds.  Exam reveals no gallop and no friction rub.   No murmur heard. Pulmonary/Chest: Effort normal and breath sounds normal. No respiratory distress. She has no wheezes. She has no rhonchi. She has no rales. She exhibits no tenderness and no crepitus.  Abdominal: Soft. Normal appearance and bowel sounds are normal. She exhibits no distension. There is no tenderness. There is no rebound and no guarding.  Musculoskeletal: Normal range of motion. She exhibits no edema or tenderness.  Moves all extremities well.   Neurological: She is alert and oriented to person, place, and time. She has normal strength. No cranial nerve deficit.  Skin: Skin is warm, dry and intact. No rash noted. No erythema. No pallor.  Psychiatric: She has a normal mood and affect. Her speech is normal and behavior is normal. Her mood appears not anxious.  Nursing note and vitals reviewed.   ED Course   Procedures (including critical care time)  Medications  sodium chloride 0.9 % bolus 1,000 mL (not administered)  sodium chloride 0.9 % bolus 1,000 mL (1,000 mLs Intravenous New Bag/Given 03/30/15 0635)  ondansetron (ZOFRAN) injection 4 mg (4 mg Intramuscular Given 03/30/15 0632)  loperamide (IMODIUM) capsule 4 mg (4 mg Oral Given 03/30/15 0631)  acetaminophen (TYLENOL) tablet 650 mg (650 mg Oral Given 03/30/15 [id number])   Patient was given IV fluids and nausea medicine. Once her nausea is controlled she will be given Imodium for her  diarrhea.  Pt requesting something for her headache. She was given acetaminophen.    Pt left with Dr Audie Pinto at change of shift to get results of her labs and to make sure her vomiting and diarrhea improve.   Labs Review  Pending   Mr Virgel Paling [id number] Contrast  Mr Brain Wo Contrast  03/12/2015  CLINICAL DATA:  69 year old diabetic hypertensive female with prior left carotid occlusion and subclavian to the carotid bypass 2000. Aneurysms. Follow-up. Subsequent encounter.  IMPRESSION: MRI HEAD No acute infarct or intracranial hemorrhage. Remote right cerebellar infarcts. Prominent peri vascular spaces basal ganglia as versus remote small infarcts. Mild to slightly moderate small vessel disease type changes. Atrophy without hydrocephalus. No intracranial mass lesion noted on this unenhanced exam. MRA HEAD 2.6 mm aneurysm arising from the superior aspect of the right internal carotid artery cavernous segment and projecting superiorly without significant change. 2.8 mm aneurysm basilar tip directed slightly to the right minimally changed from prior exam when this measured 2.6 mm. Prominent right posterior communicating artery origin infundibulum without change. Moderate narrowing cavernous segment internal carotid artery bilaterally. Left internal carotid artery caliber slightly smaller than the right without significant change. Anterior cerebral arteries supplied from the  right with aplastic A1 segment on the left. Poor delineation of the right vertebral artery which appears to end in a posterior inferior cerebellar artery distribution. Branch vessel atherosclerotic type changes as noted above. Electronically Signed   By: Genia Del M.D.   On: 03/12/2015 18:49     MDM   Final diagnoses:  Nausea vomiting and diarrhea  Dehydration    Disposition pending   Rolland Porter, MD, Barbette Or, MD 03/30/15 [phone]

## 2015-03-30 NOTE — ED Notes (Signed)
Pt c/o diarrhea and nausea. Pt was given 4mg  of zofran enroute to facility.

## 2015-04-04 ENCOUNTER — Ambulatory Visit (HOSPITAL_COMMUNITY)
Admission: RE | Admit: 2015-04-04 | Discharge: 2015-04-04 | Disposition: A | Discharge status: Home / Self Care | Payer: Medicare Other | Source: Ambulatory Visit | Attending: Interventional Radiology | Admitting: Interventional Radiology

## 2015-04-04 DIAGNOSIS — I771 Stricture of artery: Secondary | ICD-10-CM

## 2015-04-04 DIAGNOSIS — I729 Aneurysm of unspecified site: Secondary | ICD-10-CM

## 2015-10-24 ENCOUNTER — Encounter (INDEPENDENT_AMBULATORY_CARE_PROVIDER_SITE_OTHER): Payer: Self-pay | Admitting: *Deleted

## 2015-10-24 ENCOUNTER — Encounter (INDEPENDENT_AMBULATORY_CARE_PROVIDER_SITE_OTHER): Payer: Self-pay

## 2016-02-04 ENCOUNTER — Ambulatory Visit (INDEPENDENT_AMBULATORY_CARE_PROVIDER_SITE_OTHER): Payer: Medicare Other | Admitting: Physician Assistant

## 2016-02-04 ENCOUNTER — Encounter: Payer: Self-pay | Admitting: Physician Assistant

## 2016-02-04 VITALS — BP 164/80 | HR 85 | Temp 96.9°F | Ht 62.0 in | Wt 147.2 lb

## 2016-02-04 DIAGNOSIS — M5431 Sciatica, right side: Secondary | ICD-10-CM

## 2016-02-04 DIAGNOSIS — G8929 Other chronic pain: Secondary | ICD-10-CM | POA: Diagnosis not present

## 2016-02-04 DIAGNOSIS — J441 Chronic obstructive pulmonary disease with (acute) exacerbation: Secondary | ICD-10-CM | POA: Diagnosis not present

## 2016-02-04 DIAGNOSIS — M5441 Lumbago with sciatica, right side: Secondary | ICD-10-CM

## 2016-02-04 DIAGNOSIS — J3089 Other allergic rhinitis: Secondary | ICD-10-CM | POA: Diagnosis not present

## 2016-02-04 MED ORDER — PREDNISONE 10 MG (21) PO TBPK: ORAL_TABLET | ORAL | 0 refills | Status: DC

## 2016-02-04 MED ORDER — CYCLOBENZAPRINE HCL 10 MG PO TABS: 5.0000 mg | ORAL_TABLET | Freq: Three times a day (TID) | ORAL | 0 refills | Status: DC | PRN

## 2016-02-04 MED ORDER — ALBUTEROL SULFATE HFA 108 (90 BASE) MCG/ACT IN AERS: 2.0000 | INHALATION_SPRAY | Freq: Four times a day (QID) | RESPIRATORY_TRACT | 6 refills | Status: DC | PRN

## 2016-02-04 MED ORDER — FLUTICASONE PROPIONATE 50 MCG/ACT NA SUSP: 1.0000 | Freq: Two times a day (BID) | NASAL | 11 refills | Status: DC

## 2016-02-04 NOTE — Patient Instructions (Signed)

## 2016-02-04 NOTE — Progress Notes (Signed)
BP (!) 164/80   Pulse 85   Temp (!) 96.9 F (36.1 C) (Oral)   Ht 5\' 2"  (1.575 m)   Wt 147 lb 3.2 oz (66.8 kg)   BMI 26.92 kg/m    Subjective:    Patient ID: Paula Watson, female    DOB: Sep 03, 1945, 70 y.o.   MRN: [id number]  Paula Watson is a 70 y.o. female presenting on 02/04/2016 for Back Pain and Dizziness  HPI she has had difficulty with her right low back and associated sciatica on the right side since August. She took a little bit of prednisone that helped it some. But in recent weeks it has greatly increased again. She has known polio damage to her legs and back. She wears a brace on the right leg at all times. She is concerned about the chronic nature of this. We again attempted medication one more time and if it does not improve we'll plan for x-ray evaluation.  She also is having significant amount of vertigo and dizziness symptoms today. This is a chronic recurrent condition for her. She did take 2 meclizine today. I've encouraged her to take this on a regular basis. She has been having a significant amount of allergy congestion. She has never been on Flonase nasal spray. Also she is having some expiratory wheeze. She is a smoker. We have again recommended that she quit. We will have an albuterol inhaler sent to the pharmacy.   Relevant past medical, surgical, family and social history reviewed and updated as indicated. Interim medical history since our last visit reviewed. Allergies and medications reviewed and updated.   Data reviewed from any sources in EPIC.  Review of Systems  Constitutional: Negative.   HENT: Positive for congestion and postnasal drip. Negative for sore throat.   Eyes: Negative.   Respiratory: Positive for cough and wheezing.   Gastrointestinal: Negative.   Genitourinary: Negative.   Musculoskeletal: Positive for arthralgias, back pain, gait problem, joint swelling and myalgias.     Social History   Social History  . Marital status:  Divorced    Spouse name: N/A  . Number of children: N/A  . Years of education: N/A   Occupational History  . Not on file.   Social History Main Topics  . Smoking status: Current Every Day Smoker    Packs/day: 0.25    Years: 40.00    Types: Cigarettes  . Smokeless tobacco: Never Used     Comment: "working hard to quit"  . Alcohol use No  . Drug use: No  . Sexual activity: Not on file   Other Topics Concern  . Not on file   Social History Narrative  . No narrative on file    Past Surgical History:  Procedure Laterality Date  . CAROTID ENDARTERECTOMY  2000 L side  . CERVICAL CONIZATION W/BX  1985  . CORONARY ANGIOPLASTY  stented 2007  . DILATION AND CURETTAGE OF UTERUS  1980's  . foot reconstructjion  1953   right foot; post polio  . TONSILLECTOMY  1954    Family History  Problem Relation Age of Onset  . Coronary artery disease Mother   . Diabetes type II Father   . Cancer Father   . Brain cancer Father   . Cancer Sister   . Coronary artery disease Brother       Medication List       Accurate as of 02/04/16  4:36 PM. Always use your most recent med list.  albuterol 108 (90 Base) MCG/ACT inhaler Commonly known as:  PROVENTIL HFA;VENTOLIN HFA Inhale 2 puffs into the lungs every 6 (six) hours as needed for wheezing or shortness of breath.   aspirin 81 MG chewable tablet Chew 81 mg by mouth 2 (two) times daily.   carvedilol 6.25 MG tablet Commonly known as:  COREG Take 6.25 mg by mouth 2 (two) times daily with a meal.   cyclobenzaprine 10 MG tablet Commonly known as:  FLEXERIL Take 0.5-1 tablets (5-10 mg total) by mouth 3 (three) times daily as needed for muscle spasms.   doxazosin 4 MG tablet Commonly known as:  CARDURA Take 4 mg by mouth at bedtime.   famotidine 20 MG tablet Commonly known as:  PEPCID Take 40 mg by mouth 2 (two) times daily.   fluticasone 50 MCG/ACT nasal spray Commonly known as:  FLONASE Place 1 spray into both  nostrils 2 (two) times daily.   lisinopril 20 MG tablet Commonly known as:  PRINIVIL,ZESTRIL Take 20 mg by mouth daily.   meclizine 25 MG tablet Commonly known as:  ANTIVERT Take 25 mg by mouth 3 (three) times daily as needed for dizziness.   metFORMIN 500 MG 24 hr tablet Commonly known as:  GLUCOPHAGE-XR Take 500 mg by mouth 2 (two) times daily.   pravastatin 10 MG tablet Commonly known as:  PRAVACHOL Take 10 mg by mouth daily.   predniSONE 10 MG (21) Tbpk tablet Commonly known as:  STERAPRED UNI-PAK 21 TAB As directed x 6 days          Objective:    BP (!) 164/80   Pulse 85   Temp (!) 96.9 F (36.1 C) (Oral)   Ht 5\' 2"  (1.575 m)   Wt 147 lb 3.2 oz (66.8 kg)   BMI 26.92 kg/m   Allergies  Allergen Reactions  . Penicillins Hives and Swelling   Wt Readings from Last 3 Encounters:  02/04/16 147 lb 3.2 oz (66.8 kg)  03/30/15 144 lb (65.3 kg)  02/19/11 150 lb 12.8 oz (68.4 kg)    Physical Exam  Constitutional: She is oriented to person, place, and time. She appears well-developed and well-nourished.  HENT:  Head: Normocephalic and atraumatic.  Right Ear: A middle ear effusion is present.  Left Ear: A middle ear effusion is present.  Nose: Mucosal edema present. Right sinus exhibits no frontal sinus tenderness. Left sinus exhibits no frontal sinus tenderness.  Mouth/Throat: Posterior oropharyngeal erythema present. No oropharyngeal exudate or tonsillar abscesses.  Eyes: Conjunctivae and EOM are normal. Pupils are equal, round, and reactive to light.  Neck: Normal range of motion.  Cardiovascular: Normal rate, regular rhythm, normal heart sounds and intact distal pulses.   Pulmonary/Chest: Effort normal and breath sounds normal.  Abdominal: Soft. Bowel sounds are normal.  Musculoskeletal:       Lumbar back: She exhibits decreased range of motion, tenderness, pain and spasm.  Neurological: She is alert and oriented to person, place, and time. She has normal  reflexes.  Skin: Skin is warm and dry. No rash noted.  Psychiatric: She has a normal mood and affect. Her behavior is normal. Judgment and thought content normal.  Nursing note and vitals reviewed.   Results for orders placed or performed during the hospital encounter of 03/30/15  Comprehensive metabolic panel  Result Value Ref Range   Sodium 143 135 - 145 mmol/L   Potassium 4.7 3.5 - 5.1 mmol/L   Chloride 115 (H) 101 - 111 mmol/L   CO2  22 22 - 32 mmol/L   Glucose, Bld 204 (H) 65 - 99 mg/dL   BUN 20 6 - 20 mg/dL   Creatinine, Ser 1.37 (H) 0.44 - 1.00 mg/dL   Calcium 8.1 (L) 8.9 - 10.3 mg/dL   Total Protein 5.9 (L) 6.5 - 8.1 g/dL   Albumin 3.3 (L) 3.5 - 5.0 g/dL   AST 16 15 - 41 U/L   ALT 13 (L) 14 - 54 U/L   Alkaline Phosphatase 59 38 - 126 U/L   Total Bilirubin 0.5 0.3 - 1.2 mg/dL   GFR calc non Af Amer 38 (L) >60 mL/min   GFR calc Af Amer 44 (L) >60 mL/min   Anion gap 6 5 - 15  CBC with Differential  Result Value Ref Range   WBC 16.8 (H) 4.0 - 10.5 K/uL   RBC 4.73 3.87 - 5.11 MIL/uL   Hemoglobin 13.4 12.0 - 15.0 g/dL   HCT 41.8 36.0 - 46.0 %   MCV 88.4 78.0 - 100.0 fL   MCH 28.3 26.0 - 34.0 pg   MCHC 32.1 30.0 - 36.0 g/dL   RDW 13.5 11.5 - 15.5 %   Platelets 234 150 - 400 K/uL   Neutrophils Relative % 85 %   Neutro Abs 14.3 (H) 1.7 - 7.7 K/uL   Lymphocytes Relative 6 %   Lymphs Abs 1.0 0.7 - 4.0 K/uL   Monocytes Relative 7 %   Monocytes Absolute 1.2 (H) 0.1 - 1.0 K/uL   Eosinophils Relative 2 %   Eosinophils Absolute 0.3 0.0 - 0.7 K/uL   Basophils Relative 0 %   Basophils Absolute 0.0 0.0 - 0.1 K/uL      Assessment & Plan:   1. Sciatica of right side - predniSONE (STERAPRED UNI-PAK 21 TAB) 10 MG (21) TBPK tablet; As directed x 6 days  Dispense: 21 tablet; Refill: 0  2. Chronic right-sided low back pain with right-sided sciatica - predniSONE (STERAPRED UNI-PAK 21 TAB) 10 MG (21) TBPK tablet; As directed x 6 days  Dispense: 21 tablet; Refill: 0 -  cyclobenzaprine (FLEXERIL) 10 MG tablet; Take 0.5-1 tablets (5-10 mg total) by mouth 3 (three) times daily as needed for muscle spasms.  Dispense: 30 tablet; Refill: 0  3. Chronic nonseasonal allergic rhinitis due to pollen - fluticasone (FLONASE) 50 MCG/ACT nasal spray; Place 1 spray into both nostrils 2 (two) times daily.  Dispense: 16 g; Refill: 11  4. COPD exacerbation (HCC) - albuterol (PROVENTIL HFA;VENTOLIN HFA) 108 (90 Base) MCG/ACT inhaler; Inhale 2 puffs into the lungs every 6 (six) hours as needed for wheezing or shortness of breath.  Dispense: 1 Inhaler; Refill: 6   Continue all other maintenance medications as listed above. Educational handout given for sciatica  Follow up plan: Recheck 4 weeks  Terald Sleeper PA-C [facility] [address]  [place], [place] [address] [phone]   02/04/2016, 4:36 PM

## 2016-02-07 ENCOUNTER — Other Ambulatory Visit: Payer: Self-pay | Admitting: *Deleted

## 2016-02-08 MED ORDER — CLOTRIMAZOLE-BETAMETHASONE 1-0.05 % EX LOTN: TOPICAL_LOTION | Freq: Two times a day (BID) | CUTANEOUS | 0 refills | Status: DC

## 2016-02-28 ENCOUNTER — Other Ambulatory Visit: Payer: Self-pay | Admitting: Family Medicine

## 2016-02-28 DIAGNOSIS — Z1231 Encounter for screening mammogram for malignant neoplasm of breast: Secondary | ICD-10-CM

## 2016-03-04 ENCOUNTER — Encounter: Payer: Self-pay | Admitting: Physician Assistant

## 2016-03-04 ENCOUNTER — Ambulatory Visit (INDEPENDENT_AMBULATORY_CARE_PROVIDER_SITE_OTHER): Payer: Medicare Other | Admitting: Physician Assistant

## 2016-03-04 VITALS — BP 172/70 | HR 69 | Temp 97.3°F | Ht 62.0 in | Wt 145.6 lb

## 2016-03-04 DIAGNOSIS — M5431 Sciatica, right side: Secondary | ICD-10-CM | POA: Diagnosis not present

## 2016-03-04 DIAGNOSIS — I10 Essential (primary) hypertension: Secondary | ICD-10-CM

## 2016-03-04 DIAGNOSIS — E1129 Type 2 diabetes mellitus with other diabetic kidney complication: Secondary | ICD-10-CM | POA: Diagnosis not present

## 2016-03-04 MED ORDER — AMLODIPINE BESYLATE 5 MG PO TABS: 5.0000 mg | ORAL_TABLET | Freq: Every day | ORAL | 3 refills | Status: DC

## 2016-03-04 MED ORDER — CLOTRIMAZOLE-BETAMETHASONE 1-0.05 % EX LOTN: TOPICAL_LOTION | Freq: Two times a day (BID) | CUTANEOUS | 0 refills | Status: DC

## 2016-03-04 NOTE — Patient Instructions (Signed)
Hypertension Hypertension, commonly called high blood pressure, is when the force of blood pumping through your arteries is too strong. Your arteries are the blood vessels that carry blood from your heart throughout your body. A blood pressure reading consists of a higher number over a lower number, such as 110/72. The higher number (systolic) is the pressure inside your arteries when your heart pumps. The lower number (diastolic) is the pressure inside your arteries when your heart relaxes. Ideally you want your blood pressure below 120/80. Hypertension forces your heart to work harder to pump blood. Your arteries may become narrow or stiff. Having untreated or uncontrolled hypertension can cause heart attack, stroke, kidney disease, and other problems. What increases the risk? Some risk factors for high blood pressure are controllable. Others are not. Risk factors you cannot control include:  Race. You may be at higher risk if you are African American.  Age. Risk increases with age.  Gender. Men are at higher risk than women before age 45 years. After age 65, women are at higher risk than men. Risk factors you can control include:  Not getting enough exercise or physical activity.  Being overweight.  Getting too much fat, sugar, calories, or salt in your diet.  Drinking too much alcohol. What are the signs or symptoms? Hypertension does not usually cause signs or symptoms. Extremely high blood pressure (hypertensive crisis) may cause headache, anxiety, shortness of breath, and nosebleed. How is this diagnosed? To check if you have hypertension, your health care provider will measure your blood pressure while you are seated, with your arm held at the level of your heart. It should be measured at least twice using the same arm. Certain conditions can cause a difference in blood pressure between your right and left arms. A blood pressure reading that is higher than normal on one occasion does  not mean that you need treatment. If it is not clear whether you have high blood pressure, you may be asked to return on a different day to have your blood pressure checked again. Or, you may be asked to monitor your blood pressure at home for 1 or more weeks. How is this treated? Treating high blood pressure includes making lifestyle changes and possibly taking medicine. Living a healthy lifestyle can help lower high blood pressure. You may need to change some of your habits. Lifestyle changes may include:  Following the DASH diet. This diet is high in fruits, vegetables, and whole grains. It is low in salt, red meat, and added sugars.  Keep your sodium intake below 2,300 mg per day.  Getting at least 30-45 minutes of aerobic exercise at least 4 times per week.  Losing weight if necessary.  Not smoking.  Limiting alcoholic beverages.  Learning ways to reduce stress. Your health care provider may prescribe medicine if lifestyle changes are not enough to get your blood pressure under control, and if one of the following is true:  You are 18-59 years of age and your systolic blood pressure is above 140.  You are 60 years of age or older, and your systolic blood pressure is above 150.  Your diastolic blood pressure is above 90.  You have diabetes, and your systolic blood pressure is over 140 or your diastolic blood pressure is over 90.  You have kidney disease and your blood pressure is above 140/90.  You have heart disease and your blood pressure is above 140/90. Your personal target blood pressure may vary depending on your medical   conditions, your age, and other factors. Follow these instructions at home:  Have your blood pressure rechecked as directed by your health care provider.  Take medicines only as directed by your health care provider. Follow the directions carefully. Blood pressure medicines must be taken as prescribed. The medicine does not work as well when you skip  doses. Skipping doses also puts you at risk for problems.  Do not smoke.  Monitor your blood pressure at home as directed by your health care provider. Contact a health care provider if:  You think you are having a reaction to medicines taken.  You have recurrent headaches or feel dizzy.  You have swelling in your ankles.  You have trouble with your vision. Get help right away if:  You develop a severe headache or confusion.  You have unusual weakness, numbness, or feel faint.  You have severe chest or abdominal pain.  You vomit repeatedly.  You have trouble breathing. This information is not intended to replace advice given to you by your health care provider. Make sure you discuss any questions you have with your health care provider. Document Released: 03/31/2005 Document Revised: 09/06/2015 Document Reviewed: 01/21/2013 Elsevier Interactive Patient Education  2017 [employer] Inc.  

## 2016-03-04 NOTE — Progress Notes (Signed)
BP (!) 172/70   Pulse 69   Temp 97.3 F (36.3 C) (Oral)   Ht 5\' 2"  (1.575 m)   Wt 145 lb 9.6 oz (66 kg)   BMI 26.63 kg/m    Subjective:    Patient ID: Paula Watson, female    DOB: 02-04-1946, 70 y.o.   MRN: [id number]  HPI: Paula Watson is a 70 y.o. female presenting on 03/04/2016 for Follow-up (4 week )  Feeling well, the lumbar pain is mostly better. Has more pain after being up all day.  Can get in comfortable position now where she could not before. No added weakness in legs, already has polio damage.    BP has still been up due to a lot of stressors and recent sudden family loss. She was very upset today.     Relevant past medical, surgical, family and social history reviewed and updated as indicated. Allergies and medications reviewed and updated.  Past Medical History:  Diagnosis Date  . Anemia   . Blood transfusion   . Bronchitis    "have had a couple of times in my lifetime"  . Cancer ([place]) 1985   cervical  . CKD (chronic kidney disease) stage 1, GFR 90 ml/min or greater was told by her PCP   Intolerant to ACE according to her PCP  . Coronary artery disease   . Diabetes mellitus   . GERD (gastroesophageal reflux disease)   . Hemorrhoids   . High cholesterol   . Hyperlipidemia   . Hypertension   . Polio 1953  . Poliomyelitis since 1948   has drop foot on R  . Stroke [facility]) 2007 affected R side  . Vertigo now improved    Past Surgical History:  Procedure Laterality Date  . CAROTID ENDARTERECTOMY  2000 L side  . CERVICAL CONIZATION W/BX  1985  . CORONARY ANGIOPLASTY  stented 2007  . DILATION AND CURETTAGE OF UTERUS  1980's  . foot reconstructjion  1953   right foot; post polio  . TONSILLECTOMY  1954    Review of Systems  Constitutional: Negative.  Negative for activity change, fatigue and fever.  HENT: Negative.   Eyes: Negative.   Respiratory: Negative.  Negative for cough.   Cardiovascular: Negative.  Negative for chest pain.    Gastrointestinal: Negative.  Negative for abdominal pain.  Endocrine: Negative.   Genitourinary: Negative.  Negative for dysuria.  Musculoskeletal: Positive for arthralgias, back pain and gait problem.  Skin: Negative.       Medication List       Accurate as of 03/04/16 11:10 PM. Always use your most recent med list.          albuterol 108 (90 Base) MCG/ACT inhaler Commonly known as:  PROVENTIL HFA;VENTOLIN HFA Inhale 2 puffs into the lungs every 6 (six) hours as needed for wheezing or shortness of breath.   amLODipine 5 MG tablet Commonly known as:  NORVASC Take 1 tablet (5 mg total) by mouth daily.   aspirin 81 MG chewable tablet Chew 81 mg by mouth 2 (two) times daily.   carvedilol 6.25 MG tablet Commonly known as:  COREG Take 6.25 mg by mouth 2 (two) times daily with a meal.   clotrimazole-betamethasone lotion Commonly known as:  LOTRISONE Apply topically 2 (two) times daily.   cyclobenzaprine 10 MG tablet Commonly known as:  FLEXERIL Take 0.5-1 tablets (5-10 mg total) by mouth 3 (three) times daily as needed for muscle spasms.   doxazosin 4  MG tablet Commonly known as:  CARDURA Take 4 mg by mouth at bedtime.   famotidine 20 MG tablet Commonly known as:  PEPCID Take 40 mg by mouth 2 (two) times daily.   fluticasone 50 MCG/ACT nasal spray Commonly known as:  FLONASE Place 1 spray into both nostrils 2 (two) times daily.   lisinopril 20 MG tablet Commonly known as:  PRINIVIL,ZESTRIL Take 20 mg by mouth daily.   meclizine 25 MG tablet Commonly known as:  ANTIVERT Take 25 mg by mouth 3 (three) times daily as needed for dizziness.   metFORMIN 500 MG 24 hr tablet Commonly known as:  GLUCOPHAGE-XR Take 500 mg by mouth 2 (two) times daily.   pravastatin 10 MG tablet Commonly known as:  PRAVACHOL Take 10 mg by mouth daily.          Objective:    BP (!) 172/70   Pulse 69   Temp 97.3 F (36.3 C) (Oral)   Ht 5\' 2"  (1.575 m)   Wt 145 lb 9.6 oz (66  kg)   BMI 26.63 kg/m   Allergies  Allergen Reactions  . Penicillins Hives and Swelling    Physical Exam  Constitutional: She is oriented to person, place, and time. She appears well-developed and well-nourished.  HENT:  Head: Normocephalic and atraumatic.  Eyes: Conjunctivae and EOM are normal. Pupils are equal, round, and reactive to light.  Cardiovascular: Normal rate, regular rhythm, normal heart sounds and intact distal pulses.   Pulmonary/Chest: Effort normal and breath sounds normal.  Abdominal: Soft. Bowel sounds are normal.  Neurological: She is alert and oriented to person, place, and time. She has normal reflexes.  Skin: Skin is warm and dry. No rash noted.  Psychiatric: She has a normal mood and affect. Her behavior is normal. Judgment and thought content normal.        Assessment & Plan:   1. HTN (hypertension), benign - amLODipine (NORVASC) 5 MG tablet; Take 1 tablet (5 mg total) by mouth daily.  Dispense: 90 tablet; Refill: 3  2. Controlled type 2 diabetes mellitus with other diabetic kidney complication, unspecified long term insulin use status (HCC)  3. Sciatica of right side   Continue all other maintenance medications as listed above.  Follow up plan: Return in about 4 weeks (around 04/01/2016) for recheck BP.  No orders of the defined types were placed in this encounter.   Educational handout given for htn  Terald Sleeper PA-C [facility] [address]  [place], [place] [address] [phone]   03/04/2016, 11:10 PM

## 2016-03-25 ENCOUNTER — Ambulatory Visit
Admission: RE | Admit: 2016-03-25 | Discharge: 2016-03-25 | Disposition: A | Discharge status: Home / Self Care | Payer: Medicare Other | Source: Ambulatory Visit | Attending: Family Medicine | Admitting: Family Medicine

## 2016-03-25 DIAGNOSIS — Z1231 Encounter for screening mammogram for malignant neoplasm of breast: Secondary | ICD-10-CM

## 2016-04-30 ENCOUNTER — Ambulatory Visit: Payer: Medicare Other | Admitting: Pharmacist

## 2016-05-05 ENCOUNTER — Ambulatory Visit (INDEPENDENT_AMBULATORY_CARE_PROVIDER_SITE_OTHER): Payer: Medicare Other | Admitting: Pharmacist

## 2016-05-05 ENCOUNTER — Encounter: Payer: Self-pay | Admitting: Pharmacist

## 2016-05-05 ENCOUNTER — Ambulatory Visit (INDEPENDENT_AMBULATORY_CARE_PROVIDER_SITE_OTHER): Payer: Medicare Other

## 2016-05-05 VITALS — BP 130/76 | HR 70 | Ht 62.0 in | Wt 150.0 lb

## 2016-05-05 DIAGNOSIS — E7800 Pure hypercholesterolemia, unspecified: Secondary | ICD-10-CM | POA: Diagnosis not present

## 2016-05-05 DIAGNOSIS — I10 Essential (primary) hypertension: Secondary | ICD-10-CM | POA: Diagnosis not present

## 2016-05-05 DIAGNOSIS — Z1159 Encounter for screening for other viral diseases: Secondary | ICD-10-CM | POA: Diagnosis not present

## 2016-05-05 DIAGNOSIS — Z780 Asymptomatic menopausal state: Secondary | ICD-10-CM | POA: Diagnosis not present

## 2016-05-05 DIAGNOSIS — Z0000 Encounter for general adult medical examination without abnormal findings: Secondary | ICD-10-CM

## 2016-05-05 DIAGNOSIS — E1129 Type 2 diabetes mellitus with other diabetic kidney complication: Secondary | ICD-10-CM | POA: Diagnosis not present

## 2016-05-05 DIAGNOSIS — M25571 Pain in right ankle and joints of right foot: Secondary | ICD-10-CM

## 2016-05-05 DIAGNOSIS — R809 Proteinuria, unspecified: Secondary | ICD-10-CM

## 2016-05-05 DIAGNOSIS — G8929 Other chronic pain: Secondary | ICD-10-CM

## 2016-05-05 DIAGNOSIS — Z86010 Personal history of colonic polyps: Secondary | ICD-10-CM

## 2016-05-05 LAB — BAYER DCA HB A1C WAIVED: HB A1C (BAYER DCA - WAIVED): 5.9 % (ref <7.0)

## 2016-05-05 MED ORDER — CARVEDILOL 6.25 MG PO TABS: 3.1250 mg | ORAL_TABLET | Freq: Two times a day (BID) | ORAL | 1 refills | Status: DC

## 2016-05-05 NOTE — Progress Notes (Signed)
Patient ID: Paula Watson, female   DOB: 03/09/1946, 71 y.o.   MRN: [id number]    Subjective:   Paula Watson is a 71 y.o. female who presents for an Initial Medicare Annual Wellness Visit.  She is new to our practice but has been seeing Particia Watson, Pac for several years.   Social History: Born/Raised: [place], [place] Currently lives in [place], [place] Education: graduated from high school / 12th grade Occupational history: disability from stroke; in past was companion with elderly Marital history: divorced (lost 1st husband to cancer at 41 yo) She and her sister live together. 2 children - 3 grandchildren.  Keeps her 47 yo grandson.  Alcohol/Tobacco/Substances:  no   Current Medications (verified) Outpatient Encounter Prescriptions as of 05/05/2016  Medication Sig  . amLODipine (NORVASC) 5 MG tablet Take 1 tablet (5 mg total) by mouth daily.  Marland Kitchen aspirin 81 MG chewable tablet Chew 81 mg by mouth 2 (two) times daily.  . carvedilol (COREG) 6.25 MG tablet Take 0.5 tablets (3.125 mg total) by mouth 2 (two) times daily with a meal.  . clotrimazole-betamethasone (LOTRISONE) lotion Apply topically 2 (two) times daily.  Marland Kitchen doxazosin (CARDURA) 4 MG tablet Take 4 mg by mouth at bedtime.    . famotidine (PEPCID) 20 MG tablet Take 40 mg by mouth daily.   Marland Kitchen lisinopril (PRINIVIL,ZESTRIL) 20 MG tablet Take 20 mg by mouth daily.  . meclizine (ANTIVERT) 25 MG tablet Take 25 mg by mouth 3 (three) times daily as needed for dizziness.  . metFORMIN (GLUCOPHAGE-XR) 500 MG 24 hr tablet Take 500 mg by mouth 2 (two) times daily.    . pravastatin (PRAVACHOL) 10 MG tablet Take 10 mg by mouth daily.  . [DISCONTINUED] carvedilol (COREG) 6.25 MG tablet Take 0.5 tablets by mouth 2 (two) times daily with a meal.  . albuterol (PROVENTIL HFA;VENTOLIN HFA) 108 (90 Base) MCG/ACT inhaler Inhale 2 puffs into the lungs every 6 (six) hours as needed for wheezing or shortness of breath. (Patient not  taking: Reported on 05/05/2016)  . fluticasone (FLONASE) 50 MCG/ACT nasal spray Place 1 spray into both nostrils 2 (two) times daily. (Patient not taking: Reported on 05/05/2016)  . [DISCONTINUED] cyclobenzaprine (FLEXERIL) 10 MG tablet Take 0.5-1 tablets (5-10 mg total) by mouth 3 (three) times daily as needed for muscle spasms. (Patient not taking: Reported on 05/05/2016)   No facility-administered encounter medications on file as of 05/05/2016.     Allergies (verified) Penicillins   History: Past Medical History:  Diagnosis Date  . Anemia   . Aneurysm of artery ([place])   . Blood transfusion   . Bronchitis    "have had a couple of times in my lifetime"  . Cancer ([place]) 1985   cervical  . Cataract   . CKD (chronic kidney disease) stage 1, GFR 90 ml/min or greater was told by her PCP   Intolerant to ACE according to her PCP  . Coronary artery disease   . Diabetes mellitus   . GERD (gastroesophageal reflux disease)   . Hemorrhoids   . High cholesterol   . Hyperlipidemia   . Hypertension   . Polio 1953  . Poliomyelitis since 1948   has drop foot on R  . Stroke [facility]) 2007 affected R side  . Vertigo now improved   Past Surgical History:  Procedure Laterality Date  . CAROTID ENDARTERECTOMY Left 2000 L side  . CERVICAL CONIZATION W/BX  1985  . CORONARY ANGIOPLASTY  stented  2007  . DILATION AND CURETTAGE OF UTERUS  1980's  . foot reconstructjion  1953   right foot; post polio  . TONSILLECTOMY  1954   Family History  Problem Relation Age of Onset  . Coronary artery disease Mother   . Heart disease Mother   . Diabetes type II Father   . Cancer Father     brain stem  . Brain cancer Father   . Diabetes Father   . Cancer Sister     breast with recurrance  . Diabetes Sister   . Stroke Sister   . Coronary artery disease Brother   . COPD Maternal Grandmother     breast  . COPD Paternal Grandmother     breast  . Heart disease Brother   . Heart attack Brother   . COPD Other     Social History   Occupational History  . Not on file.   Social History Main Topics  . Smoking status: Current Every Day Smoker    Packs/day: 0.25    Years: 50.00    Types: Cigarettes  . Smokeless tobacco: Never Used     Comment: "working hard to quit"  . Alcohol use No  . Drug use: No  . Sexual activity: No    Do you feel safe at home?  Yes Are there smokers in your home (other than you)? No  Dietary issues and exercise activities: Current Exercise Habits: The patient does not participate in regular exercise at present  Current Dietary habits:  Drinks milk regularly but gets lactose free.  Limits tomatoes due to reflux. Tries to limit sugar and CHO intake - breads, potatoes and corn.  Objective:    Today's Vitals   05/05/16 1502 05/05/16 1505  BP: (!) 144/62 130/76  Pulse: 70   Weight: 150 lb (68 kg)   Height: _0  (1.575 m)   PainSc: 2    PainLoc: Neck    Body mass index is 27.44 kg/m.   DEXA:  Tscore for L1 - L4 = + 1.3 T-Score for total of right hip = -2.4 T-Score for neck of right hip = -2.1   Activities of Daily Living In your present state of health, do you have any difficulty performing the following activities: 05/05/2016  Hearing? N  Vision? N  Difficulty concentrating or making decisions? N  Walking or climbing stairs? N  Dressing or bathing? N  Doing errands, shopping? N  Preparing Food and eating ? N  Using the Toilet? N  In the past six months, have you accidently leaked urine? N  Do you have problems with loss of bowel control? N  Managing your Medications? N  Managing your Finances? N  Housekeeping or managing your Housekeeping? N  Some recent data might be hidden     Cardiac Risk Factors include: advanced age (>38mn, >>26women);diabetes mellitus;dyslipidemia;hypertension;microalbuminuria;sedentary lifestyle  Depression Screen PHQ 2/9 Scores 05/05/2016 03/04/2016 02/04/2016  PHQ - 2 Score 0 0 2  PHQ- 9 Score - - 8     Fall  Risk Fall Risk  05/05/2016 03/04/2016  Falls in the past year? No No    Cognitive Function: MMSE - Mini Mental State Exam 05/05/2016  Orientation to time 5  Orientation to Place 5  Registration 3  Attention/ Calculation 5  Recall 3  Language- name 2 objects 2  Language- repeat 1  Language- follow 3 step command 3  Language- read & follow direction 1  Write a sentence 1  Copy design  1  Total score 30    Immunizations and Health Maintenance  There is no immunization history on file for this patient. Health Maintenance Due  Topic Date Due  . Hepatitis C Screening  Oct 03, 1945  . FOOT EXAM  10/01/1955  . OPHTHALMOLOGY EXAM  10/01/1955  . TETANUS/TDAP  09/30/1964  . ZOSTAVAX  09/30/2005  . DEXA SCAN  10/01/2010  . PNA vac Low Risk Adult (1 of 2 - PCV13) 10/01/2010  . HEMOGLOBIN A1C  12/05/2015    Patient Care Team: Terald Sleeper, PA-C as PCP - General (General Practice) Kathrynn Ducking, MD as Consulting Physician (Neurology) Rogene Houston, MD as Consulting Physician (Gastroenterology) Luanne Bras, MD as Consulting Physician (Interventional Radiology)  Indicate any recent Medical Services you may have received from other than [facility] providers in the past year (date may be approximate).    Assessment:    Annual Wellness Visit  Osteopenia with FRAX estimate of 13.5% (total) and 4.5% (hip) Type 2 DM - controlled with current pharmacotherapy and diet. Ankle pain related to orthootics and history of poliomyelitis   Screening Tests Health Maintenance  Topic Date Due  . Hepatitis C Screening  May 11, 1945  . FOOT EXAM  10/01/1955  . OPHTHALMOLOGY EXAM  10/01/1955  . TETANUS/TDAP  09/30/1964  . ZOSTAVAX  09/30/2005  . DEXA SCAN  10/01/2010  . PNA vac Low Risk Adult (1 of 2 - PCV13) 10/01/2010  . HEMOGLOBIN A1C  12/05/2015  . INFLUENZA VACCINE  07/12/2016 (Originally 11/13/2015)  . MAMMOGRAM  03/25/2018  . COLONOSCOPY  10/25/2018        Plan:   During the  course of the visit Monia was educated and counseled about the following appropriate screening and preventive services:   Vaccines to include Pneumoccal, Influenza, Td, Zostavax - patient declined all vaccines.  Information about Pertussis and Pneumonia vaccines given.  Colorectal cancer screening - due colonoscopy (per patient Dr Laural Golden recommended q7 years) referral sent  Cardiovascular disease screening - ECHO is UTD; EKG is due with next visit with PCP  Diabetes - continue metformin XR 558m bid  Bone Denisty / Osteoporosis Screening - [facility] today;  Awaiting lab results.   Discussed started bisphosphonate - information given and will start after labs received.  Start calcium 500-6016mqd and continue dairy intake to get total of 120037malcium per day from supplements and foods.  Mammogram - UTD  PAP - appt made for AngParticia Nearingb 2018  Glaucoma screening / Diabetic Eye Exam - patient reminded that eye appt due  Nutrition counseling - continue current diet.    Smoking cessation counseling - patient not ready to quit at this time  Advanced Directives - information provided.  Physical Activity - discussed Silver Sneakers or walking - goal is 150 minutes daily  Referral sent to ortho per pt request to evaluate ankle pain.    Orders Placed This Encounter  Procedures  . DG [facility] DEXA    Order Specific Question:   Reason for Exam (SYMPTOM  OR DIAGNOSIS REQUIRED)    Answer:   post menopausal female  . Bayer DCA Hb A1c Waived  . Microalbumin / creatinine urine ratio  . CMP14+EGFR  . Lipid panel  . CBC with Differential/Platelet  . Vitamin B12  . VITAMIN D 25 Hydroxy (Vit-D Deficiency, Fractures)  . Thyroid Panel With TSH  . Ambulatory referral to Gastroenterology    Referral Priority:   Routine    Referral Type:   Consultation  Referral Reason:   Specialty Services Required    Referred to Provider:   Rogene Houston, MD    Number of Visits Requested:   1  . Ambulatory  referral to Orthopedics    Referral Priority:   Routine    Referral Type:   Consultation    Requested Specialty:   Orthopedic Surgery    Number of Visits Requested:   1     Patient Instructions (the written plan) were given to the patient.   Cherre Robins, PharmD   05/05/2016

## 2016-05-05 NOTE — Patient Instructions (Addendum)
Paula Watson , Thank you for taking time to come for your Medicare Wellness Visit. I appreciate your ongoing commitment to your health goals. Please review the following plan we discussed and let me know if I can assist you in the future.   These are the goals we discussed: Recommend make appointment for yearly eye exam - important for diabetics to have eye exam once a year.   Increase physical activity - try Silver Sneakers or walking daily.   Continue to monitor blood glucose and limit sugar and carbohydrate intake.   Start Calcium 500 to 600mg  - take 1 tablet once a day    This is a list of the screening recommended for you and due dates:  Health Maintenance  Topic Date Due  .  Hepatitis C: One time screening is recommended by Center for Disease Control  (CDC) for  adults born from [birth year] through 1965.   1945/05/21  . Complete foot exam   10/01/1955  . Eye exam for diabetics  10/01/1955  . Tetanus Vaccine  09/30/1964  . Shingles Vaccine  09/30/2005  . DEXA scan (bone density measurement)  10/01/2010  . Pneumonia vaccines (1 of 2 - PCV13) 10/01/2010  . Hemoglobin A1C  12/05/2015  . Flu Shot  07/12/2016*  . Mammogram  03/25/2018  . Colon Cancer Screening  10/25/2018  *Topic was postponed. The date shown is not the original due date.   Fall Prevention in the Home Falls can cause injuries and can affect people from all age groups. There are many simple things that you can do to make your home safe and to help prevent falls. What can I do on the outside of my home?  Regularly repair the edges of walkways and driveways and fix any cracks.  Remove high doorway thresholds.  Trim any shrubbery on the main path into your home.  Use bright outdoor lighting.  Clear walkways of debris and clutter, including tools and rocks.  Regularly check that handrails are securely fastened and in good repair. Both sides of any steps should have handrails.  Install guardrails along the edges of  any raised decks or porches.  Have leaves, snow, and ice cleared regularly.  Use sand or salt on walkways during winter months.  In the garage, clean up any spills right away, including grease or oil spills. What can I do in the bathroom?  Use night lights.  Install grab bars by the toilet and in the tub and shower. Do not use towel bars as grab bars.  Use non-skid mats or decals on the floor of the tub or shower.  If you need to sit down while you are in the shower, use a plastic, non-slip stool.  Keep the floor dry. Immediately clean up any water that spills on the floor.  Remove soap buildup in the tub or shower on a regular basis.  Attach bath mats securely with double-sided non-slip rug tape.  Remove throw rugs and other tripping hazards from the floor. What can I do in the bedroom?  Use night lights.  Make sure that a bedside light is easy to reach.  Do not use oversized bedding that drapes onto the floor.  Have a firm chair that has side arms to use for getting dressed.  Remove throw rugs and other tripping hazards from the floor. What can I do in the kitchen?  Clean up any spills right away.  Avoid walking on wet floors.  Place frequently used items in easy-to-reach  places.  If you need to reach for something above you, use a sturdy step stool that has a grab bar.  Keep electrical cables out of the way.  Do not use floor polish or wax that makes floors slippery. If you have to use wax, make sure that it is non-skid floor wax.  Remove throw rugs and other tripping hazards from the floor. What can I do in the stairways?  Do not leave any items on the stairs.  Make sure that there are handrails on both sides of the stairs. Fix handrails that are broken or loose. Make sure that handrails are as long as the stairways.  Check any carpeting to make sure that it is firmly attached to the stairs. Fix any carpet that is loose or worn.  Avoid having throw rugs  at the top or bottom of stairways, or secure the rugs with carpet tape to prevent them from moving.  Make sure that you have a light switch at the top of the stairs and the bottom of the stairs. If you do not have them, have them installed. What are some other fall prevention tips?  Wear closed-toe shoes that fit well and support your feet. Wear shoes that have rubber soles or low heels.  When you use a stepladder, make sure that it is completely opened and that the sides are firmly locked. Have someone hold the ladder while you are using it. Do not climb a closed stepladder.  Add color or contrast paint or tape to grab bars and handrails in your home. Place contrasting color strips on the first and last steps.  Use mobility aids as needed, such as canes, walkers, scooters, and crutches.  Turn on lights if it is dark. Replace any light bulbs that burn out.  Set up furniture so that there are clear paths. Keep the furniture in the same spot.  Fix any uneven floor surfaces.  Choose a carpet design that does not hide the edge of steps of a stairway.  Be aware of any and all pets.  Review your medicines with your healthcare provider. Some medicines can cause dizziness or changes in blood pressure, which increase your risk of falling. Talk with your health care provider about other ways that you can decrease your risk of falls. This may include working with a physical therapist or trainer to improve your strength, balance, and endurance. This information is not intended to replace advice given to you by your health care provider. Make sure you discuss any questions you have with your health care provider. Document Released: 03/21/2002 Document Revised: 08/28/2015 Document Reviewed: 05/05/2014 Elsevier Interactive Patient Education  2017 [employer].

## 2016-05-06 ENCOUNTER — Telehealth (HOSPITAL_COMMUNITY): Payer: Self-pay

## 2016-05-06 ENCOUNTER — Encounter (INDEPENDENT_AMBULATORY_CARE_PROVIDER_SITE_OTHER): Payer: Self-pay | Admitting: *Deleted

## 2016-05-06 LAB — LIPID PANEL
CHOLESTEROL TOTAL: 175 mg/dL (ref 100–199)
Chol/HDL Ratio: 3.4 ratio units (ref 0.0–4.4)
HDL: 51 mg/dL (ref >39)
LDL CALC: 94 mg/dL (ref 0–99)
Triglycerides: 151 mg/dL — ABNORMAL HIGH (ref 0–149)
VLDL CHOLESTEROL CAL: 30 mg/dL (ref 5–40)

## 2016-05-06 LAB — CMP14+EGFR
ALK PHOS: 84 IU/L (ref 39–117)
ALT: 12 IU/L (ref 0–32)
AST: 14 IU/L (ref 0–40)
Albumin/Globulin Ratio: 1.8 (ref 1.2–2.2)
Albumin: 4.1 g/dL (ref 3.5–4.8)
BILIRUBIN TOTAL: 0.3 mg/dL (ref 0.0–1.2)
BUN/Creatinine Ratio: 21 (ref 12–28)
BUN: 27 mg/dL (ref 8–27)
CHLORIDE: 104 mmol/L (ref 96–106)
CO2: 20 mmol/L (ref 18–29)
CREATININE: 1.29 mg/dL — AB (ref 0.57–1.00)
Calcium: 9 mg/dL (ref 8.7–10.3)
GFR calc Af Amer: 48 mL/min/{1.73_m2} — ABNORMAL LOW (ref >59)
GFR calc non Af Amer: 42 mL/min/{1.73_m2} — ABNORMAL LOW (ref >59)
GLUCOSE: 110 mg/dL — AB (ref 65–99)
Globulin, Total: 2.3 g/dL (ref 1.5–4.5)
Potassium: 4.9 mmol/L (ref 3.5–5.2)
Sodium: 141 mmol/L (ref 134–144)
TOTAL PROTEIN: 6.4 g/dL (ref 6.0–8.5)

## 2016-05-06 LAB — CBC WITH DIFFERENTIAL/PLATELET
Basophils Absolute: 0 10*3/uL (ref 0.0–0.2)
Basos: 0 %
EOS (ABSOLUTE): 0.2 10*3/uL (ref 0.0–0.4)
EOS: 2 %
HEMATOCRIT: 40.3 % (ref 34.0–46.6)
Hemoglobin: 13.4 g/dL (ref 11.1–15.9)
Immature Grans (Abs): 0 10*3/uL (ref 0.0–0.1)
Immature Granulocytes: 0 %
LYMPHS ABS: 2.6 10*3/uL (ref 0.7–3.1)
Lymphs: 30 %
MCH: 28.9 pg (ref 26.6–33.0)
MCHC: 33.3 g/dL (ref 31.5–35.7)
MCV: 87 fL (ref 79–97)
MONOS ABS: 0.8 10*3/uL (ref 0.1–0.9)
Monocytes: 9 %
Neutrophils Absolute: 5 10*3/uL (ref 1.4–7.0)
Neutrophils: 59 %
Platelets: 275 10*3/uL (ref 150–379)
RBC: 4.63 x10E6/uL (ref 3.77–5.28)
RDW: 13.5 % (ref 12.3–15.4)
WBC: 8.6 10*3/uL (ref 3.4–10.8)

## 2016-05-06 LAB — THYROID PANEL WITH TSH
Free Thyroxine Index: 2.2 (ref 1.2–4.9)
T3 Uptake Ratio: 27 % (ref 24–39)
T4, Total: 8.3 ug/dL (ref 4.5–12.0)
TSH: 4.18 u[IU]/mL (ref 0.450–4.500)

## 2016-05-06 LAB — MICROALBUMIN / CREATININE URINE RATIO
CREATININE, UR: 72 mg/dL
MICROALB/CREAT RATIO: 11.4 mg/g{creat} (ref 0.0–30.0)
Microalbumin, Urine: 8.2 ug/mL

## 2016-05-06 LAB — VITAMIN D 25 HYDROXY (VIT D DEFICIENCY, FRACTURES): Vit D, 25-Hydroxy: 8.8 ng/mL — ABNORMAL LOW (ref 30.0–100.0)

## 2016-05-06 LAB — VITAMIN B12: Vitamin B-12: 205 pg/mL — ABNORMAL LOW (ref 232–1245)

## 2016-05-06 NOTE — Telephone Encounter (Signed)
Called to schedule 1 year f/u mri, no answer, no vm. AW

## 2016-05-07 LAB — HEPATITIS C ANTIBODY

## 2016-05-07 LAB — SPECIMEN STATUS REPORT

## 2016-05-09 NOTE — Progress Notes (Signed)
Patient aware.

## 2016-05-16 ENCOUNTER — Telehealth (HOSPITAL_COMMUNITY): Payer: Self-pay

## 2016-05-16 NOTE — Telephone Encounter (Signed)
Called to schedule f/u mri, left vm. AW

## 2016-06-09 ENCOUNTER — Ambulatory Visit (INDEPENDENT_AMBULATORY_CARE_PROVIDER_SITE_OTHER): Payer: Medicare Other | Admitting: Physician Assistant

## 2016-06-09 ENCOUNTER — Encounter (INDEPENDENT_AMBULATORY_CARE_PROVIDER_SITE_OTHER): Payer: Self-pay

## 2016-06-09 ENCOUNTER — Encounter: Payer: Self-pay | Admitting: Physician Assistant

## 2016-06-09 VITALS — BP 126/58 | HR 70 | Temp 97.8°F | Ht 62.0 in | Wt 151.6 lb

## 2016-06-09 DIAGNOSIS — Z01419 Encounter for gynecological examination (general) (routine) without abnormal findings: Secondary | ICD-10-CM

## 2016-06-09 DIAGNOSIS — Z0000 Encounter for general adult medical examination without abnormal findings: Secondary | ICD-10-CM

## 2016-06-09 DIAGNOSIS — Z23 Encounter for immunization: Secondary | ICD-10-CM | POA: Diagnosis not present

## 2016-06-09 DIAGNOSIS — L0291 Cutaneous abscess, unspecified: Secondary | ICD-10-CM

## 2016-06-09 DIAGNOSIS — Z8612 Personal history of poliomyelitis: Secondary | ICD-10-CM

## 2016-06-09 DIAGNOSIS — Z8673 Personal history of transient ischemic attack (TIA), and cerebral infarction without residual deficits: Secondary | ICD-10-CM

## 2016-06-09 DIAGNOSIS — I10 Essential (primary) hypertension: Secondary | ICD-10-CM

## 2016-06-09 DIAGNOSIS — M5441 Lumbago with sciatica, right side: Secondary | ICD-10-CM

## 2016-06-09 DIAGNOSIS — G8929 Other chronic pain: Secondary | ICD-10-CM

## 2016-06-09 DIAGNOSIS — K640 First degree hemorrhoids: Secondary | ICD-10-CM

## 2016-06-09 DIAGNOSIS — E1129 Type 2 diabetes mellitus with other diabetic kidney complication: Secondary | ICD-10-CM

## 2016-06-09 DIAGNOSIS — R809 Proteinuria, unspecified: Secondary | ICD-10-CM

## 2016-06-09 DIAGNOSIS — Z8739 Personal history of other diseases of the musculoskeletal system and connective tissue: Secondary | ICD-10-CM

## 2016-06-09 DIAGNOSIS — I729 Aneurysm of unspecified site: Secondary | ICD-10-CM

## 2016-06-09 MED ORDER — DOXAZOSIN MESYLATE 4 MG PO TABS: 4.0000 mg | ORAL_TABLET | Freq: Every day | ORAL | 3 refills | Status: DC

## 2016-06-09 MED ORDER — AMLODIPINE BESYLATE 5 MG PO TABS: 5.0000 mg | ORAL_TABLET | Freq: Every day | ORAL | 3 refills | Status: DC

## 2016-06-09 MED ORDER — LISINOPRIL 20 MG PO TABS: 20.0000 mg | ORAL_TABLET | Freq: Every day | ORAL | 3 refills | Status: DC

## 2016-06-09 MED ORDER — HYDROCORTISONE 2.5 % RE CREA: 1.0000 "application " | TOPICAL_CREAM | Freq: Two times a day (BID) | RECTAL | 0 refills | Status: DC

## 2016-06-09 MED ORDER — FAMOTIDINE 20 MG PO TABS
40.0000 mg | ORAL_TABLET | Freq: Every day | ORAL | 3 refills | Status: DC
Start: 2016-06-09 — End: 2017-06-11

## 2016-06-09 MED ORDER — CLOTRIMAZOLE-BETAMETHASONE 1-0.05 % EX LOTN: TOPICAL_LOTION | Freq: Two times a day (BID) | CUTANEOUS | 0 refills | Status: DC

## 2016-06-09 MED ORDER — METFORMIN HCL ER 500 MG PO TB24: 500.0000 mg | ORAL_TABLET | Freq: Two times a day (BID) | ORAL | 3 refills | Status: DC

## 2016-06-09 MED ORDER — MUPIROCIN CALCIUM 2 % EX CREA: 1.0000 "application " | TOPICAL_CREAM | Freq: Two times a day (BID) | CUTANEOUS | 0 refills | Status: AC

## 2016-06-09 MED ORDER — CARVEDILOL 6.25 MG PO TABS: 3.1250 mg | ORAL_TABLET | Freq: Two times a day (BID) | ORAL | 3 refills | Status: DC

## 2016-06-09 MED ORDER — PRAVASTATIN SODIUM 10 MG PO TABS: 10.0000 mg | ORAL_TABLET | Freq: Every day | ORAL | 3 refills | Status: DC

## 2016-06-09 NOTE — Patient Instructions (Signed)

## 2016-06-10 NOTE — Progress Notes (Signed)
BP (!) 126/58   Pulse 70   Temp 97.8 F (36.6 C) (Oral)   Ht 5' 2"  (1.575 m)   Wt 151 lb 9.6 oz (68.8 kg)   BMI 27.73 kg/m    Subjective:    Patient ID: Paula Watson, female    DOB: Sep 05, 1945, 71 y.o.   MRN: [id number]  HPI: Paula Watson is a 71 y.o. female presenting on 06/09/2016 for Gynecologic Exam and Annual Exam  This patient comes in for annual well physical examination. All medications are reviewed today. There are no reports of any problems with the medications. All of the medical conditions are reviewed and updated.  Lab work is reviewed and will be ordered as medically necessary. There are no new problems reported with today's visit.  Patient reports doing well overall.  This patient comes in for periodic recheck on medications and conditions including Uncontrolled diabetes, history of polio, hypertension, history of stroke, elevated cholesterol.   All medications are reviewed today. There are no reports of any problems with the medications. All of the medical conditions are reviewed and updated.  Lab work is reviewed and will be ordered as medically necessary. There are no new problems reported with today's visit.   Past Medical History:  Diagnosis Date  . Anemia   . Aneurysm of artery ([place])   . Blood transfusion   . Bronchitis    "have had a couple of times in my lifetime"  . Cancer ([place]) 1985   cervical  . Cataract   . CKD (chronic kidney disease) stage 1, GFR 90 ml/min or greater was told by her PCP   Intolerant to ACE according to her PCP  . Coronary artery disease   . Diabetes mellitus   . GERD (gastroesophageal reflux disease)   . Hemorrhoids   . High cholesterol   . Hyperlipidemia   . Hypertension   . Polio 1953  . Poliomyelitis since 1948   has drop foot on R  . Stroke [facility]) 2007 affected R side  . Vertigo now improved   Relevant past medical, surgical, family and social history reviewed and updated as indicated. Interim medical history since  our last visit reviewed. Allergies and medications reviewed and updated. DATA REVIEWED: CHART IN EPIC  Social History   Social History  . Marital status: Divorced    Spouse name: N/A  . Number of children: N/A  . Years of education: N/A   Occupational History  . Not on file.   Social History Main Topics  . Smoking status: Current Every Day Smoker    Packs/day: 0.25    Years: 50.00    Types: Cigarettes  . Smokeless tobacco: Never Used     Comment: "working hard to quit"  . Alcohol use No  . Drug use: No  . Sexual activity: No   Other Topics Concern  . Not on file   Social History Narrative  . No narrative on file    Past Surgical History:  Procedure Laterality Date  . CAROTID ENDARTERECTOMY Left 2000 L side  . CERVICAL CONIZATION W/BX  1985  . CORONARY ANGIOPLASTY  stented 2007  . DILATION AND CURETTAGE OF UTERUS  1980's  . foot reconstructjion  1953   right foot; post polio  . TONSILLECTOMY  1954    Family History  Problem Relation Age of Onset  . Coronary artery disease Mother   . Heart disease Mother   . Diabetes type II Father   . Cancer  Father     brain stem  . Brain cancer Father   . Diabetes Father   . Cancer Sister     breast with recurrance  . Diabetes Sister   . Stroke Sister   . Coronary artery disease Brother   . COPD Maternal Grandmother     breast  . COPD Paternal Grandmother     breast  . Heart disease Brother   . Heart attack Brother   . COPD Other     Review of Systems  Constitutional: Negative.  Negative for activity change, fatigue and fever.  HENT: Negative.   Eyes: Negative.   Respiratory: Negative.  Negative for cough.   Cardiovascular: Negative.  Negative for chest pain.  Gastrointestinal: Negative.  Negative for abdominal pain.  Endocrine: Negative.   Genitourinary: Negative.  Negative for dysuria.  Musculoskeletal: Negative.   Skin: Negative.   Neurological: Negative.     Allergies as of 06/09/2016      Reactions    Penicillins Hives, Swelling      Medication List       Accurate as of 06/09/16 11:59 PM. Always use your most recent med list.          albuterol 108 (90 Base) MCG/ACT inhaler Commonly known as:  PROVENTIL HFA;VENTOLIN HFA Inhale 2 puffs into the lungs every 6 (six) hours as needed for wheezing or shortness of breath.   amLODipine 5 MG tablet Commonly known as:  NORVASC Take 1 tablet (5 mg total) by mouth daily.   aspirin 81 MG chewable tablet Chew 81 mg by mouth 2 (two) times daily.   carvedilol 6.25 MG tablet Commonly known as:  COREG Take 0.5 tablets (3.125 mg total) by mouth 2 (two) times daily with a meal.   clotrimazole-betamethasone lotion Commonly known as:  LOTRISONE Apply topically 2 (two) times daily.   doxazosin 4 MG tablet Commonly known as:  CARDURA Take 1 tablet (4 mg total) by mouth at bedtime.   famotidine 20 MG tablet Commonly known as:  PEPCID Take 2 tablets (40 mg total) by mouth daily.   fluticasone 50 MCG/ACT nasal spray Commonly known as:  FLONASE Place 1 spray into both nostrils 2 (two) times daily.   hydrocortisone 2.5 % rectal cream Commonly known as:  ANUSOL-HC Place 1 application rectally 2 (two) times daily.   lisinopril 20 MG tablet Commonly known as:  PRINIVIL,ZESTRIL Take 1 tablet (20 mg total) by mouth daily.   meclizine 25 MG tablet Commonly known as:  ANTIVERT Take 25 mg by mouth 3 (three) times daily as needed for dizziness.   metFORMIN 500 MG 24 hr tablet Commonly known as:  GLUCOPHAGE-XR Take 1 tablet (500 mg total) by mouth 2 (two) times daily.   mupirocin cream 2 % Commonly known as:  BACTROBAN Apply 1 application topically 2 (two) times daily.   pravastatin 10 MG tablet Commonly known as:  PRAVACHOL Take 1 tablet (10 mg total) by mouth daily.          Objective:    BP (!) 126/58   Pulse 70   Temp 97.8 F (36.6 C) (Oral)   Ht 5' 2"  (1.575 m)   Wt 151 lb 9.6 oz (68.8 kg)   BMI 27.73 kg/m     Allergies  Allergen Reactions  . Penicillins Hives and Swelling    Wt Readings from Last 3 Encounters:  06/09/16 151 lb 9.6 oz (68.8 kg)  05/05/16 150 lb (68 kg)  03/04/16 145 lb 9.6 oz (  66 kg)    Physical Exam  Constitutional: She is oriented to person, place, and time. She appears well-developed and well-nourished.  HENT:  Head: Normocephalic and atraumatic.  Right Ear: Tympanic membrane, external ear and ear canal normal.  Left Ear: Tympanic membrane, external ear and ear canal normal.  Nose: Nose normal. No rhinorrhea.  Mouth/Throat: Oropharynx is clear and moist and mucous membranes are normal. No oropharyngeal exudate or posterior oropharyngeal erythema.  Eyes: Conjunctivae and EOM are normal. Pupils are equal, round, and reactive to light.  Neck: Normal range of motion. Neck supple.  Cardiovascular: Normal rate, regular rhythm, normal heart sounds and intact distal pulses.   Pulmonary/Chest: Effort normal and breath sounds normal. Right breast exhibits no mass, no skin change and no tenderness. Left breast exhibits no mass, no skin change and no tenderness. Breasts are symmetrical.  Abdominal: Soft. Bowel sounds are normal.  Genitourinary: Vagina normal. Rectal exam shows no fissure. No breast swelling, tenderness, discharge or bleeding. There is no tenderness or lesion on the right labia. There is no tenderness or lesion on the left labia. Right adnexum displays no mass, no tenderness and no fullness. Left adnexum displays no mass, no tenderness and no fullness. No tenderness or bleeding in the vagina. No vaginal discharge found.  Neurological: She is alert and oriented to person, place, and time. She has normal reflexes.  Skin: Skin is warm and dry. No rash noted.  Psychiatric: She has a normal mood and affect. Her behavior is normal. Judgment and thought content normal.    Results for orders placed or performed in visit on 05/05/16  Bayer DCA Hb A1c Waived  Result Value Ref  Range   Bayer DCA Hb A1c Waived 5.9 <7.0 %  Microalbumin / creatinine urine ratio  Result Value Ref Range   Creatinine, Urine 72.0 Not Estab. mg/dL   Albumin, Urine 8.2 Not Estab. ug/mL   Microalb/Creat Ratio 11.4 0.0 - 30.0 mg/g creat  CMP14+EGFR  Result Value Ref Range   Glucose 110 (H) 65 - 99 mg/dL   BUN 27 8 - 27 mg/dL   Creatinine, Ser 1.29 (H) 0.57 - 1.00 mg/dL   GFR calc non Af Amer 42 (L) >59 mL/min/1.73   GFR calc Af Amer 48 (L) >59 mL/min/1.73   BUN/Creatinine Ratio 21 12 - 28   Sodium 141 134 - 144 mmol/L   Potassium 4.9 3.5 - 5.2 mmol/L   Chloride 104 96 - 106 mmol/L   CO2 20 18 - 29 mmol/L   Calcium 9.0 8.7 - 10.3 mg/dL   Total Protein 6.4 6.0 - 8.5 g/dL   Albumin 4.1 3.5 - 4.8 g/dL   Globulin, Total 2.3 1.5 - 4.5 g/dL   Albumin/Globulin Ratio 1.8 1.2 - 2.2   Bilirubin Total 0.3 0.0 - 1.2 mg/dL   Alkaline Phosphatase 84 39 - 117 IU/L   AST 14 0 - 40 IU/L   ALT 12 0 - 32 IU/L  Lipid panel  Result Value Ref Range   Cholesterol, Total 175 100 - 199 mg/dL   Triglycerides 151 (H) 0 - 149 mg/dL   HDL 51 >39 mg/dL   VLDL Cholesterol Cal 30 5 - 40 mg/dL   LDL Calculated 94 0 - 99 mg/dL   Chol/HDL Ratio 3.4 0.0 - 4.4 ratio units  CBC with Differential/Platelet  Result Value Ref Range   WBC 8.6 3.4 - 10.8 x10E3/uL   RBC 4.63 3.77 - 5.28 x10E6/uL   Hemoglobin 13.4 11.1 - 15.9  g/dL   Hematocrit 40.3 34.0 - 46.6 %   MCV 87 79 - 97 fL   MCH 28.9 26.6 - 33.0 pg   MCHC 33.3 31.5 - 35.7 g/dL   RDW 13.5 12.3 - 15.4 %   Platelets 275 150 - 379 x10E3/uL   Neutrophils 59 Not Estab. %   Lymphs 30 Not Estab. %   Monocytes 9 Not Estab. %   Eos 2 Not Estab. %   Basos 0 Not Estab. %   Neutrophils Absolute 5.0 1.4 - 7.0 x10E3/uL   Lymphocytes Absolute 2.6 0.7 - 3.1 x10E3/uL   Monocytes Absolute 0.8 0.1 - 0.9 x10E3/uL   EOS (ABSOLUTE) 0.2 0.0 - 0.4 x10E3/uL   Basophils Absolute 0.0 0.0 - 0.2 x10E3/uL   Immature Granulocytes 0 Not Estab. %   Immature Grans (Abs) 0.0 0.0 -  0.1 x10E3/uL  Vitamin B12  Result Value Ref Range   Vitamin B-12 205 (L) 232 - 1,245 pg/mL  VITAMIN D 25 Hydroxy (Vit-D Deficiency, Fractures)  Result Value Ref Range   Vit D, 25-Hydroxy 8.8 (L) 30.0 - 100.0 ng/mL  Thyroid Panel With TSH  Result Value Ref Range   TSH 4.180 0.450 - 4.500 uIU/mL   T4, Total 8.3 4.5 - 12.0 ug/dL   T3 Uptake Ratio 27 24 - 39 %   Free Thyroxine Index 2.2 1.2 - 4.9  Hepatitis C antibody  Result Value Ref Range   Hep C Virus Ab <0.1 0.0 - 0.9 s/co ratio  Specimen status report  Result Value Ref Range   specimen status report Comment       Assessment & Plan:   1. Well woman exam - clotrimazole-betamethasone (LOTRISONE) lotion; Apply topically 2 (two) times daily.  Dispense: 30 mL; Refill: 0  2. Controlled type 2 diabetes mellitus with microalbuminuria, without long-term current use of insulin (HCC) - metFORMIN (GLUCOPHAGE-XR) 500 MG 24 hr tablet; Take 1 tablet (500 mg total) by mouth 2 (two) times daily.  Dispense: 180 tablet; Refill: 3  3. Chronic right-sided low back pain with right-sided sciatica  4. History of post poliomyelitis muscular atrophy  5. HTN (hypertension), benign - amLODipine (NORVASC) 5 MG tablet; Take 1 tablet (5 mg total) by mouth daily.  Dispense: 90 tablet; Refill: 3 - carvedilol (COREG) 6.25 MG tablet; Take 0.5 tablets (3.125 mg total) by mouth 2 (two) times daily with a meal.  Dispense: 90 tablet; Refill: 3 - lisinopril (PRINIVIL,ZESTRIL) 20 MG tablet; Take 1 tablet (20 mg total) by mouth daily.  Dispense: 90 tablet; Refill: 3 - doxazosin (CARDURA) 4 MG tablet; Take 1 tablet (4 mg total) by mouth at bedtime.  Dispense: 90 tablet; Refill: 3  6. History of stroke  7. Aneurysm of artery (HCC) - pravastatin (PRAVACHOL) 10 MG tablet; Take 1 tablet (10 mg total) by mouth daily.  Dispense: 90 tablet; Refill: 3  8. Grade I hemorrhoids - hydrocortisone (ANUSOL-HC) 2.5 % rectal cream; Place 1 application rectally 2 (two) times  daily.  Dispense: 30 g; Refill: 0  9. Abscess - mupirocin cream (BACTROBAN) 2 %; Apply 1 application topically 2 (two) times daily.  Dispense: 15 g; Refill: 0  10. Need for pneumococcal vaccination - Pneumococcal conjugate vaccine 13-valent   Continue all other maintenance medications as listed above.  Follow up plan: Return in about 6 months (around 12/07/2016) for recheck.  Educational handout given for health maintenance  Terald Sleeper PA-C [facility] [address]  [place], [place] [address] [phone]  06/10/2016, 8:47 PM

## 2016-07-01 ENCOUNTER — Other Ambulatory Visit (HOSPITAL_COMMUNITY): Payer: Self-pay | Admitting: Interventional Radiology

## 2016-07-01 DIAGNOSIS — I729 Aneurysm of unspecified site: Secondary | ICD-10-CM

## 2016-07-11 ENCOUNTER — Ambulatory Visit (HOSPITAL_COMMUNITY): Payer: Medicare Other

## 2016-07-11 ENCOUNTER — Ambulatory Visit (HOSPITAL_COMMUNITY)
Admission: RE | Admit: 2016-07-11 | Discharge: 2016-07-11 | Disposition: A | Discharge status: Home / Self Care | Payer: Medicare Other | Source: Ambulatory Visit | Attending: Interventional Radiology | Admitting: Interventional Radiology

## 2016-07-11 ENCOUNTER — Encounter (HOSPITAL_COMMUNITY): Payer: Self-pay

## 2016-07-11 DIAGNOSIS — I671 Cerebral aneurysm, nonruptured: Secondary | ICD-10-CM | POA: Diagnosis not present

## 2016-07-11 DIAGNOSIS — I639 Cerebral infarction, unspecified: Secondary | ICD-10-CM | POA: Diagnosis not present

## 2016-07-11 DIAGNOSIS — I729 Aneurysm of unspecified site: Secondary | ICD-10-CM | POA: Diagnosis present

## 2016-07-11 DIAGNOSIS — I6782 Cerebral ischemia: Secondary | ICD-10-CM | POA: Diagnosis not present

## 2016-07-11 LAB — CREATININE, SERUM
Creatinine, Ser: 1.41 mg/dL — ABNORMAL HIGH (ref 0.44–1.00)
GFR, EST AFRICAN AMERICAN: 43 mL/min — AB (ref >60)
GFR, EST NON AFRICAN AMERICAN: 37 mL/min — AB (ref >60)

## 2016-07-11 MED ORDER — GADOBENATE DIMEGLUMINE 529 MG/ML IV SOLN
7.0000 mL | Freq: Once | INTRAVENOUS | Status: AC | PRN
  Administered 2016-07-11: 7 mL via INTRAVENOUS

## 2016-07-15 ENCOUNTER — Other Ambulatory Visit (HOSPITAL_COMMUNITY): Payer: Self-pay | Admitting: Interventional Radiology

## 2016-07-15 DIAGNOSIS — I729 Aneurysm of unspecified site: Secondary | ICD-10-CM

## 2016-07-17 ENCOUNTER — Ambulatory Visit (HOSPITAL_COMMUNITY)
Admission: RE | Admit: 2016-07-17 | Discharge: 2016-07-17 | Disposition: A | Discharge status: Home / Self Care | Payer: Medicare Other | Source: Ambulatory Visit | Attending: Interventional Radiology | Admitting: Interventional Radiology

## 2016-07-17 DIAGNOSIS — I729 Aneurysm of unspecified site: Secondary | ICD-10-CM | POA: Diagnosis not present

## 2016-07-21 ENCOUNTER — Encounter (HOSPITAL_COMMUNITY): Payer: Self-pay | Admitting: Interventional Radiology

## 2016-07-21 HISTORY — PX: IR RADIOLOGIST EVAL & MGMT: IMG5224

## 2016-09-04 ENCOUNTER — Encounter: Payer: Self-pay | Admitting: Family Medicine

## 2016-09-04 LAB — HM DIABETES EYE EXAM

## 2016-09-29 ENCOUNTER — Other Ambulatory Visit: Payer: Self-pay | Admitting: Physician Assistant

## 2016-09-29 DIAGNOSIS — Z01419 Encounter for gynecological examination (general) (routine) without abnormal findings: Secondary | ICD-10-CM

## 2017-02-19 ENCOUNTER — Encounter: Payer: Self-pay | Admitting: Physician Assistant

## 2017-02-19 ENCOUNTER — Ambulatory Visit (INDEPENDENT_AMBULATORY_CARE_PROVIDER_SITE_OTHER): Payer: Medicare Other | Admitting: Physician Assistant

## 2017-02-19 VITALS — BP 167/66 | HR 66 | Temp 97.2°F | Ht 62.0 in | Wt 155.8 lb

## 2017-02-19 DIAGNOSIS — I10 Essential (primary) hypertension: Secondary | ICD-10-CM

## 2017-02-19 DIAGNOSIS — R002 Palpitations: Secondary | ICD-10-CM | POA: Diagnosis not present

## 2017-02-19 DIAGNOSIS — R630 Anorexia: Secondary | ICD-10-CM | POA: Insufficient documentation

## 2017-02-19 DIAGNOSIS — E1129 Type 2 diabetes mellitus with other diabetic kidney complication: Secondary | ICD-10-CM

## 2017-02-19 DIAGNOSIS — R296 Repeated falls: Secondary | ICD-10-CM | POA: Diagnosis not present

## 2017-02-19 DIAGNOSIS — R112 Nausea with vomiting, unspecified: Secondary | ICD-10-CM | POA: Diagnosis not present

## 2017-02-19 DIAGNOSIS — R809 Proteinuria, unspecified: Secondary | ICD-10-CM

## 2017-02-19 LAB — BAYER DCA HB A1C WAIVED: HB A1C (BAYER DCA - WAIVED): 6.3 % (ref <7.0)

## 2017-02-19 MED ORDER — AMLODIPINE BESYLATE 10 MG PO TABS: 5.0000 mg | ORAL_TABLET | Freq: Every day | ORAL | 1 refills | Status: DC

## 2017-02-19 MED ORDER — CARVEDILOL 6.25 MG PO TABS: 6.2500 mg | ORAL_TABLET | Freq: Two times a day (BID) | ORAL | 1 refills | Status: DC

## 2017-02-19 NOTE — Patient Instructions (Signed)
In a few days you may receive a survey in the mail or online from [employer] regarding your visit with us today. Please take a moment to fill this out. Your feedback is very important to our whole office. It can help us better understand your needs as well as improve your experience and satisfaction. Thank you for taking your time to complete it. We care about you.  Valta Dillon, PA-C  

## 2017-02-19 NOTE — Progress Notes (Signed)
BP (!) 167/66   Pulse 66   Temp (!) 97.2 F (36.2 C) (Oral)   Ht _0  (1.575 m)   Wt 155 lb 12.8 oz (70.7 kg)   BMI 28.50 kg/m    Subjective:    Patient ID: Paula Watson, female    DOB: Jul 12, 1945, 71 y.o.   MRN: [id number]  HPI: Paula Watson is a 71 y.o. female presenting on 02/19/2017 for Nausea; Dizziness (3-4 weeks); and Labs Only  Patient reports having a significant amount of symptoms.  She is extremely fatigued.  She gets very tired when she walks very far at all.  She is having to sit down and rest.  She occasionally has palpitations.  It is more at rest.  When she sits down at night.  She is associated dizzy spells upon arising.  She has had a problem with vertigo for many years.  She also has had some neurologic dysfunction related to a stroke.  We can get her back in with neurology that she has seen before.  She is also due labs.  She also is reporting a severe amount of nausea that occurs every morning.  Some days she even has vomiting.  She is not eating very well and knows that part of her not feeling well is not having good food intake.  She had also stopped her Norvasc thinking that was causing nausea.  Since then her blood pressure has been more elevated.  We are going to do some adjustments with her blood pressure medicine and I am planning to see her back in 4 weeks.  Relevant past medical, surgical, family and social history reviewed and updated as indicated. Allergies and medications reviewed and updated.  Past Medical History:  Diagnosis Date  . Anemia   . Aneurysm of artery ([place])   . Blood transfusion   . Bronchitis    "have had a couple of times in my lifetime"  . Cancer ([place]) 1985   cervical  . Cataract   . CKD (chronic kidney disease) stage 1, GFR 90 ml/min or greater was told by her PCP   Intolerant to ACE according to her PCP  . Coronary artery disease   . Diabetes mellitus   . GERD (gastroesophageal reflux disease)   . Hemorrhoids   . High  cholesterol   . Hyperlipidemia   . Hypertension   . Polio 1953  . Poliomyelitis since 1948   has drop foot on R  . Stroke [facility]) 2007 affected R side  . Vertigo now improved    Past Surgical History:  Procedure Laterality Date  . CAROTID ENDARTERECTOMY Left 2000 L side  . CERVICAL CONIZATION W/BX  1985  . CORONARY ANGIOPLASTY  stented 2007  . DILATION AND CURETTAGE OF UTERUS  1980's  . foot reconstructjion  1953   right foot; post polio  . IR RADIOLOGIST EVAL & MGMT  07/21/2016  . TONSILLECTOMY  1954    Review of Systems  Constitutional: Positive for fatigue. Negative for activity change, chills and fever.  HENT: Negative.   Eyes: Negative.   Respiratory: Positive for shortness of breath. Negative for cough and wheezing.   Cardiovascular: Positive for palpitations. Negative for chest pain and leg swelling.  Gastrointestinal: Positive for nausea and vomiting. Negative for abdominal distention, abdominal pain, anal bleeding, blood in stool, constipation and diarrhea.  Endocrine: Negative.  Negative for cold intolerance, heat intolerance, polydipsia and polyphagia.  Genitourinary: Negative.  Negative for dysuria.  Musculoskeletal: Positive  for arthralgias, back pain and gait problem.  Skin: Negative.   Neurological: Positive for dizziness and weakness. Negative for syncope, light-headedness and headaches.  Hematological: Negative.   Psychiatric/Behavioral: Positive for sleep disturbance. The patient is nervous/anxious.     Allergies as of 02/19/2017      Reactions   Penicillins Hives, Swelling      Medication List        Accurate as of 02/19/17  3:41 PM. Always use your most recent med list.          albuterol 108 (90 Base) MCG/ACT inhaler Commonly known as:  PROVENTIL HFA;VENTOLIN HFA Inhale 2 puffs into the lungs every 6 (six) hours as needed for wheezing or shortness of breath.   amLODipine 10 MG tablet Commonly known as:  NORVASC Take 0.5 tablets (5 mg total)  daily by mouth.   aspirin 81 MG chewable tablet Chew 81 mg by mouth 2 (two) times daily.   carvedilol 6.25 MG tablet Commonly known as:  COREG Take 1 tablet (6.25 mg total) 2 (two) times daily with a meal by mouth.   clotrimazole-betamethasone lotion Commonly known as:  LOTRISONE APPLY TOPICALLY TWO TIMES DAILY   famotidine 20 MG tablet Commonly known as:  PEPCID Take 2 tablets (40 mg total) by mouth daily.   fluticasone 50 MCG/ACT nasal spray Commonly known as:  FLONASE Place 1 spray into both nostrils 2 (two) times daily.   hydrocortisone 2.5 % rectal cream Commonly known as:  ANUSOL-HC Place 1 application rectally 2 (two) times daily.   lisinopril 20 MG tablet Commonly known as:  PRINIVIL,ZESTRIL Take 1 tablet (20 mg total) by mouth daily.   meclizine 25 MG tablet Commonly known as:  ANTIVERT Take 25 mg by mouth 3 (three) times daily as needed for dizziness.   metFORMIN 500 MG 24 hr tablet Commonly known as:  GLUCOPHAGE-XR Take 1 tablet (500 mg total) by mouth 2 (two) times daily.   mupirocin cream 2 % Commonly known as:  BACTROBAN Apply 1 application topically 2 (two) times daily.   pravastatin 10 MG tablet Commonly known as:  PRAVACHOL Take 1 tablet (10 mg total) by mouth daily.          Objective:    BP (!) 167/66   Pulse 66   Temp (!) 97.2 F (36.2 C) (Oral)   Ht _0  (1.575 m)   Wt 155 lb 12.8 oz (70.7 kg)   BMI 28.50 kg/m   Allergies  Allergen Reactions  . Penicillins Hives and Swelling    Physical Exam  Constitutional: She is oriented to person, place, and time. She appears well-developed and well-nourished.  HENT:  Head: Normocephalic and atraumatic.  Right Ear: Tympanic membrane, external ear and ear canal normal.  Left Ear: Tympanic membrane, external ear and ear canal normal.  Nose: Nose normal. No rhinorrhea.  Mouth/Throat: Oropharynx is clear and moist and mucous membranes are normal. No oropharyngeal exudate or posterior  oropharyngeal erythema.  Eyes: Conjunctivae and EOM are normal. Pupils are equal, round, and reactive to light.  Neck: Normal range of motion. Neck supple.  Cardiovascular: Normal rate, regular rhythm, normal heart sounds and intact distal pulses.  Pulmonary/Chest: Effort normal and breath sounds normal.  Abdominal: Soft. Bowel sounds are normal.  Musculoskeletal:  Leg deformity from polio as child  Neurological: She is alert and oriented to person, place, and time. She has normal reflexes.  Skin: Skin is warm and dry. No rash noted.  Psychiatric: She has a  normal mood and affect. Her behavior is normal. Judgment and thought content normal.    Results for orders placed or performed in visit on 02/19/17  Bayer DCA Hb A1c Waived  Result Value Ref Range   Bayer DCA Hb A1c Waived 6.3 <7.0 %      Assessment & Plan:   1. HTN (hypertension), benign - carvedilol (COREG) 6.25 MG tablet; Take 1 tablet (6.25 mg total) 2 (two) times daily with a meal by mouth.  Dispense: 180 tablet; Refill: 1 - amLODipine (NORVASC) 10 MG tablet; Take 0.5 tablets (5 mg total) daily by mouth.  Dispense: 90 tablet; Refill: 1 - Ambulatory referral to Cardiology - CBC with Differential/Platelet - CMP14+EGFR - Lipid panel - Thyroid Panel With TSH - Bayer DCA Hb A1c Waived  2. Palpitation - Ambulatory referral to Cardiology  3. Frequent falls Patient has neurology specialist Will refer for evaluation  4. Controlled type 2 diabetes mellitus with microalbuminuria, without long-term current use of insulin (HCC) - CBC with Differential/Platelet - CMP14+EGFR - Lipid panel - Thyroid Panel With TSH - Bayer DCA Hb A1c Waived  5. Appetite loss - Ambulatory referral to Gastroenterology  6. Non-intractable vomiting with nausea, unspecified vomiting type - Ambulatory referral to Gastroenterology    Current Outpatient Medications:  .  aspirin 81 MG chewable tablet, Chew 81 mg by mouth 2 (two) times daily.,  Disp: , Rfl:  .  carvedilol (COREG) 6.25 MG tablet, Take 1 tablet (6.25 mg total) 2 (two) times daily with a meal by mouth., Disp: 180 tablet, Rfl: 1 .  clotrimazole-betamethasone (LOTRISONE) lotion, APPLY TOPICALLY TWO TIMES DAILY, Disp: 30 mL, Rfl: 0 .  famotidine (PEPCID) 20 MG tablet, Take 2 tablets (40 mg total) by mouth daily., Disp: 180 tablet, Rfl: 3 .  hydrocortisone (ANUSOL-HC) 2.5 % rectal cream, Place 1 application rectally 2 (two) times daily., Disp: 30 g, Rfl: 0 .  lisinopril (PRINIVIL,ZESTRIL) 20 MG tablet, Take 1 tablet (20 mg total) by mouth daily., Disp: 90 tablet, Rfl: 3 .  meclizine (ANTIVERT) 25 MG tablet, Take 25 mg by mouth 3 (three) times daily as needed for dizziness., Disp: , Rfl:  .  metFORMIN (GLUCOPHAGE-XR) 500 MG 24 hr tablet, Take 1 tablet (500 mg total) by mouth 2 (two) times daily., Disp: 180 tablet, Rfl: 3 .  mupirocin cream (BACTROBAN) 2 %, Apply 1 application topically 2 (two) times daily., Disp: 15 g, Rfl: 0 .  pravastatin (PRAVACHOL) 10 MG tablet, Take 1 tablet (10 mg total) by mouth daily., Disp: 90 tablet, Rfl: 3 .  albuterol (PROVENTIL HFA;VENTOLIN HFA) 108 (90 Base) MCG/ACT inhaler, Inhale 2 puffs into the lungs every 6 (six) hours as needed for wheezing or shortness of breath. (Patient not taking: Reported on 02/19/2017), Disp: 1 Inhaler, Rfl: 6 .  amLODipine (NORVASC) 10 MG tablet, Take 0.5 tablets (5 mg total) daily by mouth., Disp: 90 tablet, Rfl: 1 .  fluticasone (FLONASE) 50 MCG/ACT nasal spray, Place 1 spray into both nostrils 2 (two) times daily. (Patient not taking: Reported on 02/19/2017), Disp: 16 g, Rfl: 11 Continue all other maintenance medications as listed above.  Follow up plan: Return in about 4 weeks (around 03/19/2017) for recheck.  Educational handout given for [place] PA-C [facility] [address]  [place], [place] [address] [phone]   02/19/2017, 3:41 PM

## 2017-02-20 LAB — CBC WITH DIFFERENTIAL/PLATELET
BASOS: 0 %
Basophils Absolute: 0 10*3/uL (ref 0.0–0.2)
EOS (ABSOLUTE): 0.3 10*3/uL (ref 0.0–0.4)
EOS: 4 %
HEMATOCRIT: 40.6 % (ref 34.0–46.6)
HEMOGLOBIN: 13.5 g/dL (ref 11.1–15.9)
IMMATURE GRANULOCYTES: 0 %
Immature Grans (Abs): 0 10*3/uL (ref 0.0–0.1)
LYMPHS ABS: 2.2 10*3/uL (ref 0.7–3.1)
Lymphs: 30 %
MCH: 28.2 pg (ref 26.6–33.0)
MCHC: 33.3 g/dL (ref 31.5–35.7)
MCV: 85 fL (ref 79–97)
MONOCYTES: 8 %
MONOS ABS: 0.6 10*3/uL (ref 0.1–0.9)
NEUTROS PCT: 58 %
Neutrophils Absolute: 4.3 10*3/uL (ref 1.4–7.0)
Platelets: 274 10*3/uL (ref 150–379)
RBC: 4.79 x10E6/uL (ref 3.77–5.28)
RDW: 14.3 % (ref 12.3–15.4)
WBC: 7.4 10*3/uL (ref 3.4–10.8)

## 2017-02-20 LAB — CMP14+EGFR
A/G RATIO: 2.2 (ref 1.2–2.2)
ALBUMIN: 4.1 g/dL (ref 3.5–4.8)
ALT: 8 IU/L (ref 0–32)
AST: 17 IU/L (ref 0–40)
Alkaline Phosphatase: 73 IU/L (ref 39–117)
BUN / CREAT RATIO: 16 (ref 12–28)
BUN: 23 mg/dL (ref 8–27)
Bilirubin Total: 0.3 mg/dL (ref 0.0–1.2)
CALCIUM: 9.1 mg/dL (ref 8.7–10.3)
CO2: 23 mmol/L (ref 20–29)
CREATININE: 1.48 mg/dL — AB (ref 0.57–1.00)
Chloride: 105 mmol/L (ref 96–106)
GFR, EST AFRICAN AMERICAN: 41 mL/min/{1.73_m2} — AB (ref >59)
GFR, EST NON AFRICAN AMERICAN: 35 mL/min/{1.73_m2} — AB (ref >59)
GLOBULIN, TOTAL: 1.9 g/dL (ref 1.5–4.5)
Glucose: 119 mg/dL — ABNORMAL HIGH (ref 65–99)
POTASSIUM: 4.9 mmol/L (ref 3.5–5.2)
SODIUM: 142 mmol/L (ref 134–144)
Total Protein: 6 g/dL (ref 6.0–8.5)

## 2017-02-20 LAB — LIPID PANEL
CHOL/HDL RATIO: 3.4 ratio (ref 0.0–4.4)
Cholesterol, Total: 161 mg/dL (ref 100–199)
HDL: 48 mg/dL (ref >39)
LDL CALC: 80 mg/dL (ref 0–99)
Triglycerides: 163 mg/dL — ABNORMAL HIGH (ref 0–149)
VLDL Cholesterol Cal: 33 mg/dL (ref 5–40)

## 2017-02-20 LAB — THYROID PANEL WITH TSH
FREE THYROXINE INDEX: 2.1 (ref 1.2–4.9)
T3 UPTAKE RATIO: 27 % (ref 24–39)
T4 TOTAL: 7.6 ug/dL (ref 4.5–12.0)
TSH: 4.47 u[IU]/mL (ref 0.450–4.500)

## 2017-02-23 ENCOUNTER — Other Ambulatory Visit: Payer: Self-pay | Admitting: *Deleted

## 2017-02-23 DIAGNOSIS — R7989 Other specified abnormal findings of blood chemistry: Secondary | ICD-10-CM

## 2017-02-23 NOTE — Progress Notes (Signed)
a 

## 2017-03-02 ENCOUNTER — Encounter: Payer: Self-pay | Admitting: Internal Medicine

## 2017-03-03 ENCOUNTER — Emergency Department (HOSPITAL_COMMUNITY): Payer: Medicare Other

## 2017-03-03 ENCOUNTER — Encounter (HOSPITAL_COMMUNITY): Payer: Self-pay

## 2017-03-03 ENCOUNTER — Telehealth: Payer: Self-pay | Admitting: Physician Assistant

## 2017-03-03 ENCOUNTER — Inpatient Hospital Stay (HOSPITAL_COMMUNITY)
Admission: EM | Admit: 2017-03-03 | Discharge: 2017-03-08 | DRG: 062 | Disposition: A | Discharge status: Home / Self Care | Payer: Medicare Other | Attending: Neurology | Admitting: Neurology

## 2017-03-03 ENCOUNTER — Inpatient Hospital Stay (HOSPITAL_COMMUNITY): Payer: Medicare Other

## 2017-03-03 DIAGNOSIS — Z7984 Long term (current) use of oral hypoglycemic drugs: Secondary | ICD-10-CM | POA: Diagnosis not present

## 2017-03-03 DIAGNOSIS — I63219 Cerebral infarction due to unspecified occlusion or stenosis of unspecified vertebral arteries: Secondary | ICD-10-CM

## 2017-03-03 DIAGNOSIS — Z8249 Family history of ischemic heart disease and other diseases of the circulatory system: Secondary | ICD-10-CM | POA: Diagnosis not present

## 2017-03-03 DIAGNOSIS — Z825 Family history of asthma and other chronic lower respiratory diseases: Secondary | ICD-10-CM | POA: Diagnosis not present

## 2017-03-03 DIAGNOSIS — R29703 NIHSS score 3: Secondary | ICD-10-CM | POA: Diagnosis present

## 2017-03-03 DIAGNOSIS — R471 Dysarthria and anarthria: Secondary | ICD-10-CM | POA: Diagnosis present

## 2017-03-03 DIAGNOSIS — N182 Chronic kidney disease, stage 2 (mild): Secondary | ICD-10-CM | POA: Diagnosis not present

## 2017-03-03 DIAGNOSIS — Z823 Family history of stroke: Secondary | ICD-10-CM

## 2017-03-03 DIAGNOSIS — I10 Essential (primary) hypertension: Secondary | ICD-10-CM

## 2017-03-03 DIAGNOSIS — I129 Hypertensive chronic kidney disease with stage 1 through stage 4 chronic kidney disease, or unspecified chronic kidney disease: Secondary | ICD-10-CM | POA: Diagnosis not present

## 2017-03-03 DIAGNOSIS — T82858A Stenosis of vascular prosthetic devices, implants and grafts, initial encounter: Secondary | ICD-10-CM | POA: Diagnosis present

## 2017-03-03 DIAGNOSIS — Z9861 Coronary angioplasty status: Secondary | ICD-10-CM

## 2017-03-03 DIAGNOSIS — E7800 Pure hypercholesterolemia, unspecified: Secondary | ICD-10-CM | POA: Diagnosis present

## 2017-03-03 DIAGNOSIS — I6340 Cerebral infarction due to embolism of unspecified cerebral artery: Secondary | ICD-10-CM | POA: Diagnosis not present

## 2017-03-03 DIAGNOSIS — E785 Hyperlipidemia, unspecified: Secondary | ICD-10-CM | POA: Diagnosis not present

## 2017-03-03 DIAGNOSIS — I63413 Cerebral infarction due to embolism of bilateral middle cerebral arteries: Principal | ICD-10-CM | POA: Diagnosis present

## 2017-03-03 DIAGNOSIS — I6521 Occlusion and stenosis of right carotid artery: Secondary | ICD-10-CM | POA: Diagnosis not present

## 2017-03-03 DIAGNOSIS — I639 Cerebral infarction, unspecified: Secondary | ICD-10-CM | POA: Diagnosis not present

## 2017-03-03 DIAGNOSIS — E1122 Type 2 diabetes mellitus with diabetic chronic kidney disease: Secondary | ICD-10-CM | POA: Diagnosis not present

## 2017-03-03 DIAGNOSIS — Z8541 Personal history of malignant neoplasm of cervix uteri: Secondary | ICD-10-CM

## 2017-03-03 DIAGNOSIS — Z808 Family history of malignant neoplasm of other organs or systems: Secondary | ICD-10-CM

## 2017-03-03 DIAGNOSIS — R29810 Facial weakness: Secondary | ICD-10-CM | POA: Diagnosis not present

## 2017-03-03 DIAGNOSIS — Z8673 Personal history of transient ischemic attack (TIA), and cerebral infarction without residual deficits: Secondary | ICD-10-CM | POA: Diagnosis not present

## 2017-03-03 DIAGNOSIS — Z833 Family history of diabetes mellitus: Secondary | ICD-10-CM

## 2017-03-03 DIAGNOSIS — E1159 Type 2 diabetes mellitus with other circulatory complications: Secondary | ICD-10-CM | POA: Diagnosis not present

## 2017-03-03 DIAGNOSIS — Z7982 Long term (current) use of aspirin: Secondary | ICD-10-CM

## 2017-03-03 DIAGNOSIS — M21371 Foot drop, right foot: Secondary | ICD-10-CM | POA: Diagnosis present

## 2017-03-03 DIAGNOSIS — R002 Palpitations: Secondary | ICD-10-CM | POA: Diagnosis not present

## 2017-03-03 DIAGNOSIS — G8194 Hemiplegia, unspecified affecting left nondominant side: Secondary | ICD-10-CM | POA: Diagnosis present

## 2017-03-03 DIAGNOSIS — R29818 Other symptoms and signs involving the nervous system: Secondary | ICD-10-CM | POA: Diagnosis not present

## 2017-03-03 DIAGNOSIS — N281 Cyst of kidney, acquired: Secondary | ICD-10-CM | POA: Diagnosis not present

## 2017-03-03 DIAGNOSIS — E1151 Type 2 diabetes mellitus with diabetic peripheral angiopathy without gangrene: Secondary | ICD-10-CM | POA: Diagnosis present

## 2017-03-03 DIAGNOSIS — Z8612 Personal history of poliomyelitis: Secondary | ICD-10-CM

## 2017-03-03 DIAGNOSIS — I63019 Cerebral infarction due to thrombosis of unspecified vertebral artery: Secondary | ICD-10-CM

## 2017-03-03 DIAGNOSIS — I6300 Cerebral infarction due to thrombosis of unspecified precerebral artery: Secondary | ICD-10-CM

## 2017-03-03 DIAGNOSIS — I2510 Atherosclerotic heart disease of native coronary artery without angina pectoris: Secondary | ICD-10-CM | POA: Diagnosis not present

## 2017-03-03 DIAGNOSIS — M549 Dorsalgia, unspecified: Secondary | ICD-10-CM

## 2017-03-03 DIAGNOSIS — I6522 Occlusion and stenosis of left carotid artery: Secondary | ICD-10-CM | POA: Diagnosis not present

## 2017-03-03 DIAGNOSIS — F17210 Nicotine dependence, cigarettes, uncomplicated: Secondary | ICD-10-CM | POA: Diagnosis present

## 2017-03-03 DIAGNOSIS — Z880 Allergy status to penicillin: Secondary | ICD-10-CM | POA: Diagnosis not present

## 2017-03-03 DIAGNOSIS — I361 Nonrheumatic tricuspid (valve) insufficiency: Secondary | ICD-10-CM | POA: Diagnosis not present

## 2017-03-03 DIAGNOSIS — I739 Peripheral vascular disease, unspecified: Secondary | ICD-10-CM | POA: Diagnosis not present

## 2017-03-03 DIAGNOSIS — K219 Gastro-esophageal reflux disease without esophagitis: Secondary | ICD-10-CM | POA: Diagnosis present

## 2017-03-03 DIAGNOSIS — I6523 Occlusion and stenosis of bilateral carotid arteries: Secondary | ICD-10-CM | POA: Diagnosis not present

## 2017-03-03 DIAGNOSIS — N181 Chronic kidney disease, stage 1: Secondary | ICD-10-CM | POA: Diagnosis not present

## 2017-03-03 DIAGNOSIS — F17200 Nicotine dependence, unspecified, uncomplicated: Secondary | ICD-10-CM | POA: Diagnosis not present

## 2017-03-03 DIAGNOSIS — N200 Calculus of kidney: Secondary | ICD-10-CM | POA: Diagnosis not present

## 2017-03-03 DIAGNOSIS — I725 Aneurysm of other precerebral arteries: Secondary | ICD-10-CM | POA: Diagnosis not present

## 2017-03-03 LAB — COMPREHENSIVE METABOLIC PANEL
ALBUMIN: 4 g/dL (ref 3.5–5.0)
ALK PHOS: 71 U/L (ref 38–126)
ALT: 14 U/L (ref 14–54)
AST: 21 U/L (ref 15–41)
Anion gap: 7 (ref 5–15)
BUN: 20 mg/dL (ref 6–20)
CO2: 22 mmol/L (ref 22–32)
Calcium: 9.2 mg/dL (ref 8.9–10.3)
Chloride: 108 mmol/L (ref 101–111)
Creatinine, Ser: 1.35 mg/dL — ABNORMAL HIGH (ref 0.44–1.00)
GFR calc Af Amer: 45 mL/min — ABNORMAL LOW (ref >60)
GFR, EST NON AFRICAN AMERICAN: 38 mL/min — AB (ref >60)
GLUCOSE: 126 mg/dL — AB (ref 65–99)
POTASSIUM: 4 mmol/L (ref 3.5–5.1)
Sodium: 137 mmol/L (ref 135–145)
Total Bilirubin: 0.7 mg/dL (ref 0.3–1.2)
Total Protein: 6.9 g/dL (ref 6.5–8.1)

## 2017-03-03 LAB — DIFFERENTIAL
BASOS ABS: 0 10*3/uL (ref 0.0–0.1)
Basophils Relative: 0 %
EOS ABS: 0.4 10*3/uL (ref 0.0–0.7)
Eosinophils Relative: 4 %
LYMPHS ABS: 3.5 10*3/uL (ref 0.7–4.0)
LYMPHS PCT: 34 %
MONOS PCT: 8 %
Monocytes Absolute: 0.8 10*3/uL (ref 0.1–1.0)
NEUTROS PCT: 54 %
Neutro Abs: 5.4 10*3/uL (ref 1.7–7.7)

## 2017-03-03 LAB — I-STAT CHEM 8, ED
BUN: 22 mg/dL — AB (ref 6–20)
CHLORIDE: 107 mmol/L (ref 101–111)
Calcium, Ion: 1.18 mmol/L (ref 1.15–1.40)
Creatinine, Ser: 1.3 mg/dL — ABNORMAL HIGH (ref 0.44–1.00)
Glucose, Bld: 125 mg/dL — ABNORMAL HIGH (ref 65–99)
HEMATOCRIT: 42 % (ref 36.0–46.0)
Hemoglobin: 14.3 g/dL (ref 12.0–15.0)
POTASSIUM: 3.9 mmol/L (ref 3.5–5.1)
SODIUM: 140 mmol/L (ref 135–145)
TCO2: 22 mmol/L (ref 22–32)

## 2017-03-03 LAB — PROTIME-INR
INR: 0.92
Prothrombin Time: 12.3 seconds (ref 11.4–15.2)

## 2017-03-03 LAB — CBC
HEMATOCRIT: 42.1 % (ref 36.0–46.0)
HEMOGLOBIN: 13.9 g/dL (ref 12.0–15.0)
MCH: 28.3 pg (ref 26.0–34.0)
MCHC: 33 g/dL (ref 30.0–36.0)
MCV: 85.7 fL (ref 78.0–100.0)
Platelets: 265 10*3/uL (ref 150–400)
RBC: 4.91 MIL/uL (ref 3.87–5.11)
RDW: 13.6 % (ref 11.5–15.5)
WBC: 10 10*3/uL (ref 4.0–10.5)

## 2017-03-03 LAB — I-STAT TROPONIN, ED: Troponin i, poc: 0 ng/mL (ref 0.00–0.08)

## 2017-03-03 LAB — APTT: APTT: 31 s (ref 24–36)

## 2017-03-03 MED ORDER — SODIUM CHLORIDE 0.9 % IV SOLN
INTRAVENOUS | Status: DC
  Administered 2017-03-03: 23:00:00 via INTRAVENOUS

## 2017-03-03 MED ORDER — AMLODIPINE BESYLATE 10 MG PO TABS: 10.0000 mg | ORAL_TABLET | Freq: Every day | ORAL | 1 refills | Status: DC

## 2017-03-03 MED ORDER — IOPAMIDOL (ISOVUE-370) INJECTION 76%
INTRAVENOUS | Status: AC
  Filled 2017-03-03: qty 50

## 2017-03-03 MED ORDER — PANTOPRAZOLE SODIUM 40 MG IV SOLR
40.0000 mg | Freq: Every day | INTRAVENOUS | Status: DC
  Administered 2017-03-04: 40 mg via INTRAVENOUS
  Filled 2017-03-03: qty 40

## 2017-03-03 MED ORDER — ALTEPLASE (STROKE) FULL DOSE INFUSION
0.9000 mg/kg | Freq: Once | INTRAVENOUS | Status: AC
  Administered 2017-03-03: 62 mg via INTRAVENOUS
  Filled 2017-03-03: qty 100

## 2017-03-03 MED ORDER — STROKE: EARLY STAGES OF RECOVERY BOOK
Freq: Once | Status: DC
  Filled 2017-03-03 (×2): qty 1

## 2017-03-03 NOTE — ED Notes (Signed)
Neurologist and Stroke RN aware of decrease in BP - states to continue with 8ml/hr NS infusion following tPA. Will continue to monitor.

## 2017-03-03 NOTE — ED Notes (Addendum)
Dr. Roxanne Mins notified of patients symptoms, pt is a TIA workup.

## 2017-03-03 NOTE — ED Notes (Addendum)
Called in room by family, pt now has L sided facial droop and L arm weakness with drift

## 2017-03-03 NOTE — ED Notes (Signed)
Change in mental status noted while MD at bedside repeating neuro exam. Patient getting worse, worsening facial droop, pronator drift on L side, and speech changes noted.

## 2017-03-03 NOTE — H&P (Addendum)
Chief Complaint: Slurred speech and left-sided week  History obtained from:   Patient and Chart     HPI:                                                                                                                                       Paula Watson is an 71 y.o. female Caucasian right-handed female with PMH of CVA in 2007, left CEA, basilar artery aneurysm, hyperlipidemia, hypertension, diabetes mellitus, coronary artery disease, CK D presents with sudden onset inability to lift the left arm, left facial droop and severe slurred speech while watching a game around 7 PM. Her symptoms lasted for about 5 minutes and had completely resolved by the time patient arrived to the emergency room. She was being evaluated for TIA when around 9:15 PM family noted her symptoms have returned and notified the nurse. She was stroke alerted at 920 and patient was brought to the [facility]. Her blood pressure was 497 systolic. CT head showed no acute infarct/hemorrhage with aspects of 10.  On my initial assessment immediately following her CT scan, the patient's symptoms had improved and she no longer had a left arm drift or slurred speech. She scored NIHSS of 1 for mild  facial droop. Given her rapidly improving and not disabling symptoms, we decided not to administer TPA with plan for close monitoring if patient became worse.  Shortly after, when patient had returned back to her room in the ER- she started slurring her speech and was noted to have a left pronator drift by the ER physician. Due to worsening of her symptoms and concern that she would return to her initial presentation of left hemiplegia and severe dysarthria which was disabling, we decided to administer TPA. The risks versus benefit of administrating TPA with low NIH assess of 3 with disabling symptoms was explained to the family.    Date last known well: 11. .20.18 Time last known well: 9.15 p.m. tPA Given: Administered NIHSS: 3 Baseline  MRS 0    Past Medical History:  Diagnosis Date  . Anemia   . Aneurysm of artery ([place])   . Blood transfusion   . Bronchitis    "have had a couple of times in my lifetime"  . Cancer ([place]) 1985   cervical  . Cataract   . CKD (chronic kidney disease) stage 1, GFR 90 ml/min or greater was told by her PCP   Intolerant to ACE according to her PCP  . Coronary artery disease   . Diabetes mellitus   . GERD (gastroesophageal reflux disease)   . Hemorrhoids   . High cholesterol   . Hyperlipidemia   . Hypertension   . Polio 1953  . Poliomyelitis since 1948   has drop foot on R  . Stroke [facility]) 2007 affected R side  . Vertigo now improved    Past Surgical History:  Procedure Laterality Date  . CAROTID ENDARTERECTOMY Left 2000 L side  . CERVICAL CONIZATION W/BX  1985  . CORONARY ANGIOPLASTY  stented 2007  . DILATION AND CURETTAGE OF UTERUS  1980's  . foot reconstructjion  1953   right foot; post polio  . IR RADIOLOGIST EVAL & MGMT  07/21/2016  . TONSILLECTOMY  1954    Family History  Problem Relation Age of Onset  . Coronary artery disease Mother   . Heart disease Mother   . Diabetes type II Father   . Cancer Father        brain stem  . Brain cancer Father   . Diabetes Father   . Cancer Sister        breast with recurrance  . Diabetes Sister   . Stroke Sister   . Coronary artery disease Brother   . COPD Maternal Grandmother        breast  . COPD Paternal Grandmother        breast  . Heart disease Brother   . Heart attack Brother   . COPD Other    Social History:  reports that she has been smoking cigarettes.  She has a 12.50 pack-year smoking history. she has never used smokeless tobacco. She reports that she does not drink alcohol or use drugs.  Allergies:  Allergies  Allergen Reactions  . Penicillins Hives and Swelling    Has patient had a PCN reaction causing immediate rash, facial/tongue/throat swelling, SOB or lightheadedness with hypotension: Yes Has patient  had a PCN reaction causing severe rash involving mucus membranes or skin necrosis: Unknown Has patient had a PCN reaction that required hospitalization: No Has patient had a PCN reaction occurring within the last 10 years: No If all of the above answers are "NO", then may proceed with Cephalosporin use.     Medications:                                                                                                                        I reviewed home medications. Pt on ASA.    ROS:                                                                                                                                     14 systems reviewed and negative except above    Examination:  General: Appears well-developed and well-nourished.  Psych: Affect appropriate to situation Eyes: No scleral injection HENT: No OP obstrucion Head: Normocephalic.  Cardiovascular: Normal rate and regular rhythm.  Respiratory: Effort normal and breath sounds normal to anterior ascultation GI: Soft.  No distension. There is no tenderness.  Skin: WDI   Neurological Examination Mental Status: Alert, oriented, thought content appropriate.  Speech fluent without evidence of aphasia. Mild dysarthria Able to follow 3 step commands without difficulty. Cranial Nerves: II: Discs flat bilaterally; Visual fields grossly normal,  III,IV, VI: ptosis not present, extra-ocular motions intact bilaterally, pupils equal, round, reactive to light and accommodation V,VII: Left facial droop VIII: hearing normal bilaterally IX,X: uvula rises symmetrically XI: bilateral shoulder shrug XII: midline tongue extension Motor: Right : Upper extremity   5/5    Left:     Upper extremity   4+/5  Lower extremity   5/5     Lower extremity   4+/5 Tone and bulk:normal tone throughout; no atrophy noted Sensory: Pinprick and light touch intact  throughout, bilaterally Deep Tendon Reflexes: 2+ and symmetric throughout Plantars: Right: downgoing   Left: downgoing Cerebellar: normal finger-to-nose, normal rapid alternating movements and normal heel-to-shin test Gait: normal gait and station     Lab Results: Basic Metabolic Panel: Recent Labs  Lab 03/03/17 07/13/2109 03/03/17 2124  NA 137 140  K 4.0 3.9  CL 108 107  CO2 22  --   GLUCOSE 126* 125*  BUN 20 22*  CREATININE 1.35* 1.30*  CALCIUM 9.2  --     CBC: Recent Labs  Lab 03/03/17 2109/07/13 03/03/17 2124  WBC 10.0  --   NEUTROABS 5.4  --   HGB 13.9 14.3  HCT 42.1 42.0  MCV 85.7  --   PLT 265  --     Coagulation Studies: Recent Labs    03/03/17 2109-07-13  LABPROT 12.3  INR 0.92    Imaging: Mr Jodene Nam Head Wo Contrast  Result Date: 03/04/2017 CLINICAL DATA:  Difficulty speaking. Facial droop. Left arm weakness. EXAM: MRI HEAD WITHOUT CONTRAST MRA HEAD WITHOUT CONTRAST MRA NECK WITHOUT CONTRAST TECHNIQUE: Multiplanar, multiecho pulse sequences of the brain and surrounding structures were obtained without intravenous contrast. Angiographic images of the Circle of Willis were obtained using MRA technique without intravenous contrast. Angiographic images of the neck were obtained using MRA technique without intravenous contrast. Carotid stenosis measurements (when applicable) are obtained utilizing NASCET criteria, using the distal internal carotid diameter as the denominator. COMPARISON:  Head CT 03/03/2017 Brain MRI 07/11/2016 FINDINGS: MRI HEAD FINDINGS Brain: The midline structures are normal. There are multiple bilateral punctate foci of abnormal diffusion restriction in subcortical locations of both frontal lobes, the right parietal lobe and the posterior left temporal lobe. There is no large territory infarct. There are multiple old bilateral cerebellar and basal ganglia lacunar infarcts. The there is multifocal white matter hyperintensity suggesting chronic ischemic  microangiopathy. No mass lesion. No chronic microhemorrhage or cerebral amyloid angiopathy. No hydrocephalus, age advanced atrophy or lobar predominant volume loss. No dural abnormality or extra-axial collection. Skull and upper cervical spine: The visualized skull base, calvarium, upper cervical spine and extracranial soft tissues are normal. Sinuses/Orbits: No fluid levels or advanced mucosal thickening. No mastoid effusion. Normal orbits. MRA HEAD FINDINGS Intracranial internal carotid arteries: There is multifocal moderate to severe narrowing of the internal carotid arteries, worst at the left clinoid segment. This is unchanged. 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 Anterior cerebral arteries: Congenitally absent left A1 segment.  Normal anterior communicating artery and A2 segments. Middle cerebral arteries: Mild distal atherosclerotic irregularity. No occlusion or high-grade stenosis. Posterior communicating arteries: Absent bilaterally. Posterior cerebral arteries: Normal. Basilar artery: There is an anteriorly projecting outpouching from the tip of the basilar artery that measures 2 mm at its base and 3 mm base to apex. This is unchanged. Vertebral arteries: Left dominant. No flow related enhancement is seen in the right vertebral artery, unchanged. Superior cerebellar arteries: Normal. Anterior inferior cerebellar arteries: Not clearly seen, which is not uncommon. Posterior inferior cerebellar arteries: Poorly visualized right PICA. Normal left. MRA NECK FINDINGS Noncontrast MRA of the neck with moderate artifact obscuring details. Right carotid system: Normal course and caliber without stenosis or evidence of dissection. Left carotid system: There is chronic occlusion of the left common carotid artery. The patient is status post subclavian to parotid bypass graft, which is not adequately visualized on this study. Vertebral arteries: The left vertebral artery is normal. There is  little to no flow related enhancement seen within the right vertebral artery. IMPRESSION: 1. Multiple punctate foci of acute subcortical ischemia within both frontal lobes, the right parietal lobe and left temporal lobe. No large territory infarct. The distribution of lesions is suggestive of a cardiac or aortic embolic source. 2. Unchanged moderate to severe multifocal narrowing of the internal carotid arteries, worst at the left clinoid segment. 3. 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 4. Unchanged loss of flow related enhancement of the distal right vertebral artery. MRA of the neck shows no flow related enhancement along the cervical segments of the right vertebral artery. This may indicate occlusion, but the MRA is degraded by the lack of IV contrast. This could be further characterized with CTA of the neck or ultrasound. 5. Chronic occlusion of the left common carotid artery, status post subclavian to carotid bypass. The cervical portions of the internal carotid arteries are poorly assessed due to lack of IV contrast. This also could be further characterized with CTA of the neck or ultrasound. Electronically Signed   By: Ulyses Jarred M.D.   On: 03/04/2017 00:47   Mr Jodene Nam Neck Wo Contrast  Result Date: 03/04/2017 CLINICAL DATA:  Difficulty speaking. Facial droop. Left arm weakness. EXAM: MRI HEAD WITHOUT CONTRAST MRA HEAD WITHOUT CONTRAST MRA NECK WITHOUT CONTRAST TECHNIQUE: Multiplanar, multiecho pulse sequences of the brain and surrounding structures were obtained without intravenous contrast. Angiographic images of the Circle of Willis were obtained using MRA technique without intravenous contrast. Angiographic images of the neck were obtained using MRA technique without intravenous contrast. Carotid stenosis measurements (when applicable) are obtained utilizing NASCET criteria, using the distal internal carotid diameter  as the denominator. COMPARISON:  Head CT 03/03/2017 Brain MRI 07/11/2016 FINDINGS: MRI HEAD FINDINGS Brain: The midline structures are normal. There are multiple bilateral punctate foci of abnormal diffusion restriction in subcortical locations of both frontal lobes, the right parietal lobe and the posterior left temporal lobe. There is no large territory infarct. There are multiple old bilateral cerebellar and basal ganglia lacunar infarcts. The there is multifocal white matter hyperintensity suggesting chronic ischemic microangiopathy. No mass lesion. No chronic microhemorrhage or cerebral amyloid angiopathy. No hydrocephalus, age advanced atrophy or lobar predominant volume loss. No dural abnormality or extra-axial collection. Skull and upper cervical spine: The visualized skull base, calvarium, upper cervical spine and extracranial soft tissues are normal. Sinuses/Orbits: No fluid levels or advanced mucosal thickening. No mastoid  effusion. Normal orbits. MRA HEAD FINDINGS Intracranial internal carotid arteries: There is multifocal moderate to severe narrowing of the internal carotid arteries, worst at the left clinoid segment. This is unchanged. 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 Anterior cerebral arteries: Congenitally absent left A1 segment. Normal anterior communicating artery and A2 segments. Middle cerebral arteries: Mild distal atherosclerotic irregularity. No occlusion or high-grade stenosis. Posterior communicating arteries: Absent bilaterally. Posterior cerebral arteries: Normal. Basilar artery: There is an anteriorly projecting outpouching from the tip of the basilar artery that measures 2 mm at its base and 3 mm base to apex. This is unchanged. Vertebral arteries: Left dominant. No flow related enhancement is seen in the right vertebral artery, unchanged. Superior cerebellar arteries: Normal. Anterior inferior cerebellar arteries: Not clearly seen, which is not uncommon.  Posterior inferior cerebellar arteries: Poorly visualized right PICA. Normal left. MRA NECK FINDINGS Noncontrast MRA of the neck with moderate artifact obscuring details. Right carotid system: Normal course and caliber without stenosis or evidence of dissection. Left carotid system: There is chronic occlusion of the left common carotid artery. The patient is status post subclavian to parotid bypass graft, which is not adequately visualized on this study. Vertebral arteries: The left vertebral artery is normal. There is little to no flow related enhancement seen within the right vertebral artery. IMPRESSION: 1. Multiple punctate foci of acute subcortical ischemia within both frontal lobes, the right parietal lobe and left temporal lobe. No large territory infarct. The distribution of lesions is suggestive of a cardiac or aortic embolic source. 2. Unchanged moderate to severe multifocal narrowing of the internal carotid arteries, worst at the left clinoid segment. 3. 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 4. Unchanged loss of flow related enhancement of the distal right vertebral artery. MRA of the neck shows no flow related enhancement along the cervical segments of the right vertebral artery. This may indicate occlusion, but the MRA is degraded by the lack of IV contrast. This could be further characterized with CTA of the neck or ultrasound. 5. Chronic occlusion of the left common carotid artery, status post subclavian to carotid bypass. The cervical portions of the internal carotid arteries are poorly assessed due to lack of IV contrast. This also could be further characterized with CTA of the neck or ultrasound. Electronically Signed   By: Ulyses Jarred M.D.   On: 03/04/2017 00:47   Mr Brain Wo Contrast  Result Date: 03/04/2017 CLINICAL DATA:  Difficulty speaking. Facial droop. Left arm weakness. EXAM: MRI HEAD WITHOUT CONTRAST  MRA HEAD WITHOUT CONTRAST MRA NECK WITHOUT CONTRAST TECHNIQUE: Multiplanar, multiecho pulse sequences of the brain and surrounding structures were obtained without intravenous contrast. Angiographic images of the Circle of Willis were obtained using MRA technique without intravenous contrast. Angiographic images of the neck were obtained using MRA technique without intravenous contrast. Carotid stenosis measurements (when applicable) are obtained utilizing NASCET criteria, using the distal internal carotid diameter as the denominator. COMPARISON:  Head CT 03/03/2017 Brain MRI 07/11/2016 FINDINGS: MRI HEAD FINDINGS Brain: The midline structures are normal. There are multiple bilateral punctate foci of abnormal diffusion restriction in subcortical locations of both frontal lobes, the right parietal lobe and the posterior left temporal lobe. There is no large territory infarct. There are multiple old bilateral cerebellar and basal ganglia lacunar infarcts. The there is multifocal white matter hyperintensity suggesting chronic ischemic microangiopathy. No mass lesion. No chronic microhemorrhage or cerebral amyloid  angiopathy. No hydrocephalus, age advanced atrophy or lobar predominant volume loss. No dural abnormality or extra-axial collection. Skull and upper cervical spine: The visualized skull base, calvarium, upper cervical spine and extracranial soft tissues are normal. Sinuses/Orbits: No fluid levels or advanced mucosal thickening. No mastoid effusion. Normal orbits. MRA HEAD FINDINGS Intracranial internal carotid arteries: There is multifocal moderate to severe narrowing of the internal carotid arteries, worst at the left clinoid segment. This is unchanged. 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 Anterior cerebral arteries: Congenitally absent left A1 segment. Normal anterior communicating artery and A2 segments. Middle cerebral arteries: Mild distal atherosclerotic irregularity. No  occlusion or high-grade stenosis. Posterior communicating arteries: Absent bilaterally. Posterior cerebral arteries: Normal. Basilar artery: There is an anteriorly projecting outpouching from the tip of the basilar artery that measures 2 mm at its base and 3 mm base to apex. This is unchanged. Vertebral arteries: Left dominant. No flow related enhancement is seen in the right vertebral artery, unchanged. Superior cerebellar arteries: Normal. Anterior inferior cerebellar arteries: Not clearly seen, which is not uncommon. Posterior inferior cerebellar arteries: Poorly visualized right PICA. Normal left. MRA NECK FINDINGS Noncontrast MRA of the neck with moderate artifact obscuring details. Right carotid system: Normal course and caliber without stenosis or evidence of dissection. Left carotid system: There is chronic occlusion of the left common carotid artery. The patient is status post subclavian to parotid bypass graft, which is not adequately visualized on this study. Vertebral arteries: The left vertebral artery is normal. There is little to no flow related enhancement seen within the right vertebral artery. IMPRESSION: 1. Multiple punctate foci of acute subcortical ischemia within both frontal lobes, the right parietal lobe and left temporal lobe. No large territory infarct. The distribution of lesions is suggestive of a cardiac or aortic embolic source. 2. Unchanged moderate to severe multifocal narrowing of the internal carotid arteries, worst at the left clinoid segment. 3. 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 4. Unchanged loss of flow related enhancement of the distal right vertebral artery. MRA of the neck shows no flow related enhancement along the cervical segments of the right vertebral artery. This may indicate occlusion, but the MRA is degraded by the lack of IV contrast. This could be further characterized with CTA of  the neck or ultrasound. 5. Chronic occlusion of the left common carotid artery, status post subclavian to carotid bypass. The cervical portions of the internal carotid arteries are poorly assessed due to lack of IV contrast. This also could be further characterized with CTA of the neck or ultrasound. Electronically Signed   By: Ulyses Jarred M.D.   On: 03/04/2017 00:47   Ct Head Code Stroke Wo Contrast  Result Date: 03/03/2017 CLINICAL DATA:  Code stroke. 72 y/o F; left-sided facial droop and arm weakness. EXAM: CT HEAD WITHOUT CONTRAST TECHNIQUE: Contiguous axial images were obtained from the base of the skull through the vertex without intravenous contrast. COMPARISON:  07/11/2016 MRI of the head. FINDINGS: Brain: No evidence of acute infarction, hemorrhage, hydrocephalus, extra-axial collection or mass lesion/mass effect. Stable small chronic infarction within the right cerebellar hemisphere. Stable chronic lacunar infarctions within the right greater than left lentiform nuclei. Stable chronic microvascular ischemic changes of the brain and parenchymal volume loss. Vascular: Extensive calcific atherosclerosis of carotid siphons. No hyperdense vessel. Skull: Normal. Negative for fracture or focal lesion. Sinuses/Orbits: Small right maxillary mucous retention cyst. Otherwise negative. Other:  None. ASPECTS ([place] Stroke Program Early CT Score) - Ganglionic level infarction (caudate, lentiform nuclei, internal capsule, insula, M1-M3 cortex): 7 - Supraganglionic infarction (M4-M6 cortex): 3 Total score (0-10 with 10 being normal): 10 IMPRESSION: 1. No acute intracranial abnormality identified. 2. Stable small chronic infarction in right cerebellar hemisphere and chronic lacunar infarcts in basal ganglia. 3. Stable chronic microvascular ischemic changes and parenchymal volume loss of the brain. 4. ASPECTS is 10. These results were text paged at the time of interpretation on 03/03/2017 at 9:47 pm to Dr. Lorraine Lax.  Electronically Signed   By: Kristine Garbe M.D.   On: 03/03/2017 21:47     ASSESSMENT AND PLAN  71 y.o. female Caucasian right-handed female with PMH of CVA in 2007, left CEA, basilar artery aneurysm, hyperlipidemia, hypertension, diabetes mellitus, coronary artery disease, CKD presents with facial droop and severe dysarthria and left hemiparesis at 7 for few min - resolved 9.15 symptoms reoccurred and stroke alerted - on assessment in CT scan symptoms resolved and only mild facial droop,( NIHSS 1) decided to not give tPA but watch closely. At 9.55 pm patient worsened - NIHSS 3 ( dysarthria, pronator drift, facial droop) tPA given after explaining risk vs benefit to family. CTA  head was not performed due to CKD, MRI angiogram ordered instead.   Acute Ischemic Stroke - multiple subacute infarcts in the right frontal, parietal lobes and left frontal and temporal lobes  Risk factors: hyperlipidemia, hypertension, diabetes mellitus, coronary artery disease, CKD, prior CVA Etiology: Cardiac embolic versus atheroembolic  Recommend #MRI of the brain without contrast  #MRA Head and neck  #Transthoracic Echo  # Hold aspirin until 24-hour post tPA scan is negative #Start or continue Atorvastatin 80 mg/other high intensity statin # BP goal: permissive HTN upto 498 systolic, PRNs labetalol ordered # HBAIC and Lipid profile # Telemetry monitoring # Frequent neuro checks #  stroke swallow screen  Chronic occlusion of the left common carotid artery Intracranial atherosclerotic disease worse in the clinoid segment of left ICA   Hypertension Permissive hypertension up to 24 hours Labetalol when necessary   Diabetes mellitus Sliding scale insulin was ordered  HbA1c ordered  Coronary artery disease Hold antiplatelet as patient receive TPA  CKD Likely secondary to diabetes mellitus Fluids Will avoid contrast   DVT prophylaxis: SCDs Diet: Cardiac and diabetic diet   I  spent 60 min the care of this neurologically critical ill patient including IV tPA administration.    Raechell Singleton [facility] Pager Number [phone]

## 2017-03-03 NOTE — ED Provider Notes (Signed)
[place] EMERGENCY DEPARTMENT Provider Note   CSN: [id number] Arrival date & time: 03/03/17  2053   An emergency department physician performed an initial assessment on this suspected stroke patient at 2122.  History   Chief Complaint Chief Complaint  Patient presents with  . Code Stroke    HPI Paula Watson is a 71 y.o. female.  The history is provided by the patient and a relative.  She has a history of cervical cancer, cerebral aneurysm, chronic kidney disease, coronary artery disease, diabetes, hyperlipidemia, right foot drop from poliomyelitis.  She has been having intermittent problems with dizziness over the last month.  At 7 PM, she started having difficulty speaking and her face became twisted and she started having difficulty using her left arm.  She presented to the emergency department, but symptoms had resolved on arrival.  Same symptoms recurred while in the emergency department, and code stroke was activated.  Symptoms have resolved once again, but recurred a third time while I was evaluating the patient.  She is complaining of a global headache.  She denies nausea or vomiting.  She did have some blurring of her vision with her prior episodes tonight.  Past Medical History:  Diagnosis Date  . Anemia   . Aneurysm of artery ([place])   . Blood transfusion   . Bronchitis    "have had a couple of times in my lifetime"  . Cancer ([place]) 1985   cervical  . Cataract   . CKD (chronic kidney disease) stage 1, GFR 90 ml/min or greater was told by her PCP   Intolerant to ACE according to her PCP  . Coronary artery disease   . Diabetes mellitus   . GERD (gastroesophageal reflux disease)   . Hemorrhoids   . High cholesterol   . Hyperlipidemia   . Hypertension   . Polio 1953  . Poliomyelitis since 1948   has drop foot on R  . Stroke [facility]) 2007 affected R side  . Vertigo now improved    Patient Active Problem List   Diagnosis Date Noted  .  Palpitation 02/19/2017  . Frequent falls 02/19/2017  . Appetite loss 02/19/2017  . Nausea & vomiting 02/19/2017  . History of post poliomyelitis muscular atrophy 06/09/2016  . Well woman exam 06/09/2016  . History of stroke 06/09/2016  . Aneurysm of artery ([place]) 06/09/2016  . Sciatica of right side 02/04/2016  . Chronic right-sided low back pain with right-sided sciatica 02/04/2016  . Chronic nonseasonal allergic rhinitis due to pollen 02/04/2016  . COPD exacerbation ([place]) 02/04/2016  . TIA (transient ischemic attack) 02/19/2011  . HTN (hypertension), benign 02/19/2011  . DM (diabetes mellitus) type II controlled with renal manifestation ([place]) 02/19/2011  . Hypokalemia 02/19/2011  . Leukocytosis 02/19/2011  . UTI (lower urinary tract infection) 02/19/2011  . Tobacco abuse 02/19/2011    Past Surgical History:  Procedure Laterality Date  . CAROTID ENDARTERECTOMY Left 2000 L side  . CERVICAL CONIZATION W/BX  1985  . CORONARY ANGIOPLASTY  stented 2007  . DILATION AND CURETTAGE OF UTERUS  1980's  . foot reconstructjion  1953   right foot; post polio  . IR RADIOLOGIST EVAL & MGMT  07/21/2016  . TONSILLECTOMY  1954    OB History    No data available       Home Medications    Prior to Admission medications   Medication Sig Start Date End Date Taking? Authorizing Provider  albuterol (PROVENTIL HFA;VENTOLIN HFA) 108 (90  Base) MCG/ACT inhaler Inhale 2 puffs into the lungs every 6 (six) hours as needed for wheezing or shortness of breath. Patient not taking: Reported on 02/19/2017 02/04/16   Terald Sleeper, PA-C  amLODipine (NORVASC) 10 MG tablet Take 1 tablet (10 mg total) by mouth daily. 03/03/17   Terald Sleeper, PA-C  aspirin 81 MG chewable tablet Chew 81 mg by mouth 2 (two) times daily.    [provider]  carvedilol (COREG) 6.25 MG tablet Take 1 tablet (6.25 mg total) 2 (two) times daily with a meal by mouth. 02/19/17   Terald Sleeper, PA-C  clotrimazole-betamethasone  (LOTRISONE) lotion APPLY TOPICALLY TWO TIMES DAILY 09/29/16   Terald Sleeper, PA-C  famotidine (PEPCID) 20 MG tablet Take 2 tablets (40 mg total) by mouth daily. 06/09/16   Terald Sleeper, PA-C  fluticasone (FLONASE) 50 MCG/ACT nasal spray Place 1 spray into both nostrils 2 (two) times daily. Patient not taking: Reported on 02/19/2017 02/04/16   Terald Sleeper, PA-C  hydrocortisone (ANUSOL-HC) 2.5 % rectal cream Place 1 application rectally 2 (two) times daily. 06/09/16   Terald Sleeper, PA-C  lisinopril (PRINIVIL,ZESTRIL) 20 MG tablet Take 1 tablet (20 mg total) by mouth daily. 06/09/16   Terald Sleeper, PA-C  meclizine (ANTIVERT) 25 MG tablet Take 25 mg by mouth 3 (three) times daily as needed for dizziness.    [provider]  metFORMIN (GLUCOPHAGE-XR) 500 MG 24 hr tablet Take 1 tablet (500 mg total) by mouth 2 (two) times daily. 06/09/16   Terald Sleeper, PA-C  mupirocin cream (BACTROBAN) 2 % Apply 1 application topically 2 (two) times daily. 06/09/16   Terald Sleeper, PA-C  pravastatin (PRAVACHOL) 10 MG tablet Take 1 tablet (10 mg total) by mouth daily. 06/09/16   Terald Sleeper, PA-C    Family History Family History  Problem Relation Age of Onset  . Coronary artery disease Mother   . Heart disease Mother   . Diabetes type II Father   . Cancer Father        brain stem  . Brain cancer Father   . Diabetes Father   . Cancer Sister        breast with recurrance  . Diabetes Sister   . Stroke Sister   . Coronary artery disease Brother   . COPD Maternal Grandmother        breast  . COPD Paternal Grandmother        breast  . Heart disease Brother   . Heart attack Brother   . COPD Other     Social History Social History   Tobacco Use  . Smoking status: Current Every Day Smoker    Packs/day: 0.25    Years: 50.00    Pack years: 12.50    Types: Cigarettes  . Smokeless tobacco: Never Used  . Tobacco comment: "working hard to quit"  Substance Use Topics  . Alcohol use: No  .  Drug use: No     Allergies   Penicillins   Review of Systems Review of Systems  All other systems reviewed and are negative.    Physical Exam Updated Vital Signs BP (!) 158/63   Pulse 63   Temp 98 F (36.7 C)   Resp 18   Ht 5\' 2"  (1.575 m)   Wt 69.3 kg (152 lb 12.5 oz)   SpO2 98%   BMI 27.94 kg/m   Physical Exam  Nursing note and vitals reviewed.  [place]  year old female, resting comfortably and in no acute distress. Vital signs are significant for hypertension. Oxygen saturation is 98%, which is normal. Head is normocephalic and atraumatic. PERRLA, EOMI. Oropharynx is clear. Neck is nontender and supple without adenopathy or JVD.  There are no carotid bruits. Back is nontender and there is no CVA tenderness. Lungs are clear without rales, wheezes, or rhonchi. Chest is nontender. Heart has regular rate and rhythm without murmur. Abdomen is soft, flat, nontender without masses or hepatosplenomegaly and peristalsis is normoactive. Extremities have no cyanosis or edema, full range of motion is present. Skin is warm and dry without rash. Neurologic: She is awake and alert, but with dysarthric speech.  There is a moderate left central facial droop.  There is mild left pronator drift, and slight weakness of the left arm compared with the right.  ED Treatments / Results  Labs (all labs ordered are listed, but only abnormal results are displayed) Labs Reviewed  COMPREHENSIVE METABOLIC PANEL - Abnormal; Notable for the following components:      Result Value   Glucose, Bld 126 (*)    Creatinine, Ser 1.35 (*)    GFR calc non Af Amer 38 (*)    GFR calc Af Amer 45 (*)    All other components within normal limits  I-STAT CHEM 8, ED - Abnormal; Notable for the following components:   BUN 22 (*)    Creatinine, Ser 1.30 (*)    Glucose, Bld 125 (*)    All other components within normal limits  PROTIME-INR  APTT  CBC  DIFFERENTIAL  I-STAT TROPONIN, ED  CBG MONITORING, ED     EKG  EKG Interpretation  Date/Time:  Tuesday March 03 2017 21:03:28 EST Ventricular Rate:  62 PR Interval:  168 QRS Duration: 66 QT Interval:  400 QTC Calculation: 406 R Axis:   54 Text Interpretation:  Normal sinus rhythm Right atrial enlargement Nonspecific ST and T wave abnormality Abnormal ECG When compared with ECG of 02/20/2011, No significant change was found Confirmed by Delora Fuel ([id number]) on 03/03/2017 9:08:20 PM       Radiology Ct Head Code Stroke Wo Contrast  Result Date: 03/03/2017 CLINICAL DATA:  Code stroke. 71 y/o F; left-sided facial droop and arm weakness. EXAM: CT HEAD WITHOUT CONTRAST TECHNIQUE: Contiguous axial images were obtained from the base of the skull through the vertex without intravenous contrast. COMPARISON:  07/11/2016 MRI of the head. FINDINGS: Brain: No evidence of acute infarction, hemorrhage, hydrocephalus, extra-axial collection or mass lesion/mass effect. Stable small chronic infarction within the right cerebellar hemisphere. Stable chronic lacunar infarctions within the right greater than left lentiform nuclei. Stable chronic microvascular ischemic changes of the brain and parenchymal volume loss. Vascular: Extensive calcific atherosclerosis of carotid siphons. No hyperdense vessel. Skull: Normal. Negative for fracture or focal lesion. Sinuses/Orbits: Small right maxillary mucous retention cyst. Otherwise negative. Other: None. ASPECTS [facility] Stroke Program Early CT Score) - Ganglionic level infarction (caudate, lentiform nuclei, internal capsule, insula, M1-M3 cortex): 7 - Supraganglionic infarction (M4-M6 cortex): 3 Total score (0-10 with 10 being normal): 10 IMPRESSION: 1. No acute intracranial abnormality identified. 2. Stable small chronic infarction in right cerebellar hemisphere and chronic lacunar infarcts in basal ganglia. 3. Stable chronic microvascular ischemic changes and parenchymal volume loss of the brain. 4. ASPECTS is 10. These  results were text paged at the time of interpretation on 03/03/2017 at 9:47 pm to Dr. Lorraine Lax. Electronically Signed   By: Kristine Garbe M.D.   On:  03/03/2017 21:47    Procedures Procedures (including critical care time) CRITICAL CARE Performed by: Delora Fuel Total critical care time: 50 minutes Critical care time was exclusive of separately billable procedures and treating other patients. Critical care was necessary to treat or prevent imminent or life-threatening deterioration. Critical care was time spent personally by me on the following activities: development of treatment plan with patient and/or surrogate as well as nursing, discussions with consultants, evaluation of patient's response to treatment, examination of patient, obtaining history from patient or surrogate, ordering and performing treatments and interventions, ordering and review of laboratory studies, ordering and review of radiographic studies, pulse oximetry and re-evaluation of patient's condition.  Medications Ordered in ED Medications  iopamidol (ISOVUE-370) 76 % injection (not administered)  alteplase (ACTIVASE) 1 mg/mL infusion 62 mg (62 mg Intravenous New Bag/Given 03/03/17 2205)     Initial Impression / Assessment and Plan / ED Course  I have reviewed the triage vital signs and the nursing notes.  Pertinent labs & imaging results that were available during my care of the patient were reviewed by me and considered in my medical decision making (see chart for details).  Waxing and waning neurologic symptoms representing either TIA versus stroke.  Case was seen in conjunction with Dr. Lorraine Lax, who is concerned about the stuttering nature of symptoms and feels the patient should be treated with TPA.  He is making arrangements for TPA administration, and will admit her on his service.  Final Clinical Impressions(s) / ED Diagnoses   Final diagnoses:  Cerebrovascular accident (CVA), unspecified mechanism  [facility])    ED Discharge Orders    None       Delora Fuel, MD 89/84/21 2225

## 2017-03-03 NOTE — Telephone Encounter (Signed)
Patient wants to know how did you want her to increase her amlodipine. Patient states you had told her to take 10mg  daily but directions were to take 1/2 tab daily. Please advise

## 2017-03-03 NOTE — Telephone Encounter (Signed)
Patient aware that medication was corrected.

## 2017-03-03 NOTE — ED Triage Notes (Signed)
Pt has been having dizziness for the past several weeks. Seen at her PCP and told to follow up with cardiology. Tonight around 7pm, daughter reports that pt was unable to get out words, R eye droop. Neuro intact now, no deficits noted. C/o of headache. PTA took 3 ASA

## 2017-03-04 ENCOUNTER — Other Ambulatory Visit: Payer: Self-pay

## 2017-03-04 ENCOUNTER — Inpatient Hospital Stay (HOSPITAL_COMMUNITY): Payer: Medicare Other

## 2017-03-04 DIAGNOSIS — E785 Hyperlipidemia, unspecified: Secondary | ICD-10-CM

## 2017-03-04 DIAGNOSIS — R002 Palpitations: Secondary | ICD-10-CM

## 2017-03-04 DIAGNOSIS — I6340 Cerebral infarction due to embolism of unspecified cerebral artery: Secondary | ICD-10-CM

## 2017-03-04 DIAGNOSIS — I361 Nonrheumatic tricuspid (valve) insufficiency: Secondary | ICD-10-CM

## 2017-03-04 DIAGNOSIS — I10 Essential (primary) hypertension: Secondary | ICD-10-CM

## 2017-03-04 DIAGNOSIS — Z8673 Personal history of transient ischemic attack (TIA), and cerebral infarction without residual deficits: Secondary | ICD-10-CM

## 2017-03-04 DIAGNOSIS — E1159 Type 2 diabetes mellitus with other circulatory complications: Secondary | ICD-10-CM

## 2017-03-04 DIAGNOSIS — I725 Aneurysm of other precerebral arteries: Secondary | ICD-10-CM

## 2017-03-04 LAB — GLUCOSE, CAPILLARY
GLUCOSE-CAPILLARY: 147 mg/dL — AB (ref 65–99)
Glucose-Capillary: 95 mg/dL (ref 65–99)
Glucose-Capillary: 130 mg/dL — ABNORMAL HIGH (ref 65–99)
Glucose-Capillary: 161 mg/dL — ABNORMAL HIGH (ref 65–99)
Glucose-Capillary: 189 mg/dL — ABNORMAL HIGH (ref 65–99)

## 2017-03-04 LAB — CBC
HCT: 40.7 % (ref 36.0–46.0)
HEMOGLOBIN: 13.3 g/dL (ref 12.0–15.0)
MCH: 28.1 pg (ref 26.0–34.0)
MCHC: 32.7 g/dL (ref 30.0–36.0)
MCV: 86 fL (ref 78.0–100.0)
Platelets: 218 10*3/uL (ref 150–400)
RBC: 4.73 MIL/uL (ref 3.87–5.11)
RDW: 13.4 % (ref 11.5–15.5)
WBC: 9.6 10*3/uL (ref 4.0–10.5)

## 2017-03-04 LAB — MRSA PCR SCREENING: MRSA by PCR: NEGATIVE

## 2017-03-04 LAB — ECHOCARDIOGRAM COMPLETE
Height: 62 in
Weight: 2465.6246 oz

## 2017-03-04 LAB — BASIC METABOLIC PANEL
Anion gap: 11 (ref 5–15)
BUN: 22 mg/dL — ABNORMAL HIGH (ref 6–20)
CHLORIDE: 112 mmol/L — AB (ref 101–111)
CO2: 17 mmol/L — ABNORMAL LOW (ref 22–32)
CREATININE: 1.25 mg/dL — AB (ref 0.44–1.00)
Calcium: 8.7 mg/dL — ABNORMAL LOW (ref 8.9–10.3)
GFR calc non Af Amer: 42 mL/min — ABNORMAL LOW (ref >60)
GFR, EST AFRICAN AMERICAN: 49 mL/min — AB (ref >60)
Glucose, Bld: 98 mg/dL (ref 65–99)
Potassium: 3.7 mmol/L (ref 3.5–5.1)
SODIUM: 140 mmol/L (ref 135–145)

## 2017-03-04 LAB — LIPID PANEL
Cholesterol: 152 mg/dL (ref 0–200)
HDL: 41 mg/dL (ref >40)
LDL CALC: 83 mg/dL (ref 0–99)
Total CHOL/HDL Ratio: 3.7 RATIO
Triglycerides: 139 mg/dL (ref <150)
VLDL: 28 mg/dL (ref 0–40)

## 2017-03-04 LAB — HEMOGLOBIN A1C
HEMOGLOBIN A1C: 6.4 % — AB (ref 4.8–5.6)
Mean Plasma Glucose: 136.98 mg/dL

## 2017-03-04 MED ORDER — INSULIN ASPART 100 UNIT/ML ~~LOC~~ SOLN
0.0000 [IU] | Freq: Three times a day (TID) | SUBCUTANEOUS | Status: DC
  Administered 2017-03-04: 1 [IU] via SUBCUTANEOUS
  Administered 2017-03-04: 2 [IU] via SUBCUTANEOUS
  Administered 2017-03-05: 1 [IU] via SUBCUTANEOUS
  Administered 2017-03-05: 2 [IU] via SUBCUTANEOUS
  Administered 2017-03-05: 3 [IU] via SUBCUTANEOUS
  Administered 2017-03-06 – 2017-03-07 (×2): 2 [IU] via SUBCUTANEOUS
  Administered 2017-03-07 – 2017-03-08 (×2): 1 [IU] via SUBCUTANEOUS

## 2017-03-04 MED ORDER — INSULIN ASPART 100 UNIT/ML ~~LOC~~ SOLN
0.0000 [IU] | SUBCUTANEOUS | Status: DC
  Administered 2017-03-04: 1 [IU] via SUBCUTANEOUS

## 2017-03-04 MED ORDER — IOPAMIDOL (ISOVUE-370) INJECTION 76%
50.0000 mL | Freq: Once | INTRAVENOUS | Status: AC | PRN
  Administered 2017-03-04: 50 mL via INTRAVENOUS

## 2017-03-04 MED ORDER — FAMOTIDINE 20 MG PO TABS: 40.0000 mg | ORAL_TABLET | Freq: Every evening | ORAL | Status: DC

## 2017-03-04 MED ORDER — LABETALOL HCL 5 MG/ML IV SOLN
10.0000 mg | INTRAVENOUS | Status: DC | PRN
  Administered 2017-03-04 – 2017-03-07 (×4): 10 mg via INTRAVENOUS
  Filled 2017-03-04 (×6): qty 4

## 2017-03-04 MED ORDER — PRAVASTATIN SODIUM 20 MG PO TABS
10.0000 mg | ORAL_TABLET | Freq: Every day | ORAL | Status: DC
  Administered 2017-03-04: 10 mg via ORAL
  Filled 2017-03-04: qty 1

## 2017-03-04 MED ORDER — PRAVASTATIN SODIUM 20 MG PO TABS
20.0000 mg | ORAL_TABLET | Freq: Every day | ORAL | Status: DC
  Administered 2017-03-05 – 2017-03-07 (×3): 20 mg via ORAL
  Filled 2017-03-04 (×4): qty 1

## 2017-03-04 MED ORDER — ACETAMINOPHEN 325 MG PO TABS
650.0000 mg | ORAL_TABLET | Freq: Four times a day (QID) | ORAL | Status: DC | PRN
  Administered 2017-03-04 – 2017-03-08 (×9): 650 mg via ORAL
  Filled 2017-03-04 (×9): qty 2

## 2017-03-04 NOTE — Progress Notes (Addendum)
STROKE TEAM PROGRESS NOTE   SUBJECTIVE (INTERVAL HISTORY) Her son and daughter is at the bedside.  Overall she feels her condition is completely resolved. She recounted HPI with me and stated that she had left facial drop, left UE weakness and right ptosis yesterday, on and off, received tPA. She has had left carotid to subclavian bypass in the past. Recently, she developed exertional SOB, dizziness on walking and heart palpitation. Scheduled to see cardiology in [place] on 03/13/17. Was put on coreg by PCP and seems improved for palpitation.    OBJECTIVE Temp:  [97.9 F (36.6 C)-98.2 F (36.8 C)] 97.9 F (36.6 C) (11/21 0800) Pulse Rate:  [51-75] 65 (11/21 0800) Cardiac Rhythm: Normal sinus rhythm (11/21 0400) Resp:  [12-21] 15 (11/21 0800) BP: (107-168)/(42-85) 161/60 (11/21 0800) SpO2:  [90 %-99 %] 96 % (11/21 0800) Weight:  [152 lb 12.5 oz (69.3 kg)-154 lb 1.6 oz (69.9 kg)] 154 lb 1.6 oz (69.9 kg) (11/21 0323)  Recent Labs  Lab 03/04/17 0839  GLUCAP 95   Recent Labs  Lab 03/03/17 2111 03/03/17 2124 03/04/17 0454  NA 137 140 140  K 4.0 3.9 3.7  CL 108 107 112*  CO2 22  --  17*  GLUCOSE 126* 125* 98  BUN 20 22* 22*  CREATININE 1.35* 1.30* 1.25*  CALCIUM 9.2  --  8.7*   Recent Labs  Lab 03/03/17 2111  AST 21  ALT 14  ALKPHOS 71  BILITOT 0.7  PROT 6.9  ALBUMIN 4.0   Recent Labs  Lab 03/03/17 2111 03/03/17 2124 03/04/17 0454  WBC 10.0  --  9.6  NEUTROABS 5.4  --   --   HGB 13.9 14.3 13.3  HCT 42.1 42.0 40.7  MCV 85.7  --  86.0  PLT 265  --  218   No results for input(s): CKTOTAL, CKMB, CKMBINDEX, TROPONINI in the last 168 hours. Recent Labs    03/03/17 2111  LABPROT 12.3  INR 0.92   No results for input(s): COLORURINE, LABSPEC, PHURINE, GLUCOSEU, HGBUR, BILIRUBINUR, KETONESUR, PROTEINUR, UROBILINOGEN, NITRITE, LEUKOCYTESUR in the last 72 hours.  Invalid input(s): APPERANCEUR     Component Value Date/Time   CHOL 152 03/04/2017 0454   CHOL 161  02/19/2017 1228   TRIG 139 03/04/2017 0454   HDL 41 03/04/2017 0454   HDL 48 02/19/2017 1228   CHOLHDL 3.7 03/04/2017 0454   VLDL 28 03/04/2017 0454   LDLCALC 83 03/04/2017 0454   LDLCALC 80 02/19/2017 1228   Lab Results  Component Value Date   HGBA1C 6.4 (H) 03/04/2017   No results found for: LABOPIA, COCAINSCRNUR, LABBENZ, AMPHETMU, THCU, LABBARB  No results for input(s): ETH in the last 168 hours.  I have personally reviewed the radiological images below and agree with the radiology interpretations.  Mri brain and Mra Head and neck Wo Contrast 03/04/2017 IMPRESSION: 1. Multiple punctate foci of acute subcortical ischemia within both frontal lobes, the right parietal lobe and left temporal lobe. No large territory infarct. The distribution of lesions is suggestive of a cardiac or aortic embolic source. 2. Unchanged moderate to severe multifocal narrowing of the internal carotid arteries, worst at the left clinoid segment. 3. 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 4. Unchanged loss of flow related enhancement of the distal right vertebral artery. MRA of the neck shows no flow related enhancement along the cervical segments of the right vertebral artery. This may indicate  occlusion, but the MRA is degraded by the lack of IV contrast. This could be further characterized with CTA of the neck or ultrasound. 5. Chronic occlusion of the left common carotid artery, status post subclavian to carotid bypass. The cervical portions of the internal carotid arteries are poorly assessed due to lack of IV contrast. This also could be further characterized with CTA of the neck or ultrasound.   Ct Head Code Stroke Wo Contrast 03/03/2017 IMPRESSION: 1. No acute intracranial abnormality identified. 2. Stable small chronic infarction in right cerebellar hemisphere and chronic lacunar infarcts in basal ganglia. 3. Stable chronic  microvascular ischemic changes and parenchymal volume loss of the brain. 4. ASPECTS is 10.   CTA head and neck pending  2D Echocardiogram  pending   PHYSICAL EXAM  Temp:  [97.9 F (36.6 C)-98.2 F (36.8 C)] 97.9 F (36.6 C) (11/21 0800) Pulse Rate:  [51-75] 65 (11/21 0800) Resp:  [12-21] 15 (11/21 0800) BP: (107-168)/(42-85) 161/60 (11/21 0800) SpO2:  [90 %-99 %] 96 % (11/21 0800) Weight:  [152 lb 12.5 oz (69.3 kg)-154 lb 1.6 oz (69.9 kg)] 154 lb 1.6 oz (69.9 kg) (11/21 0323)  General - Well nourished, well developed, in no apparent distress.  Ophthalmologic - fundi not visualized due to noncooperation.  Cardiovascular - Regular rate and rhythm with no murmur.  Mental Status -  Level of arousal and orientation to time, place, and person were intact. Language including expression, naming, repetition, comprehension was assessed and found intact. Fund of Knowledge was assessed and was intact.  Cranial Nerves II - XII - II - Visual field intact OU. III, IV, VI - Extraocular movements intact. V - Facial sensation intact bilaterally. VII - Facial movement intact bilaterally. VIII - Hearing & vestibular intact bilaterally. X - Palate elevates symmetrically. XI - Chin turning & shoulder shrug intact bilaterally. XII - Tongue protrusion intact.  Motor Strength - The patient's strength was normal in all extremities except right foot 3/5 PF and DF due to hx of polio and pronator drift was absent.  Bulk was normal and fasciculations were absent.   Motor Tone - Muscle tone was assessed at the neck and appendages and was normal.  Reflexes - The patient's reflexes were symmetrical in all extremities and she had no pathological reflexes.  Sensory - Light touch, temperature/pinprick were assessed and were symmetrical.    Coordination - The patient had normal movements in the hands with no ataxia or dysmetria.  Tremor was absent.  Gait and Station - deferred.   ASSESSMENT/PLAN Ms.  Paula Watson is a 71 y.o. female with history of CKD, CAD, DM, HLD, HTN, polil, TIA, BA tip aneurysm admitted for recurrent left arm and facial weakness, slurry speech. TPA given.    Stroke:  bilateral MCA/ACA and MCA/PCA four punctate infarcts, embolic pattern, concerning for undiagnosed afib  Resultant back to baseline  MRI 4 punctate foci of acute subcortical ischemia within both frontal lobes, the right parietal lobe and left temporal lobe  MRA  38mm BA tip aneurysm, stable  CTA head and neck pending  2D Echo  Pending  Due to recent episodes of heart palpitation and improved with coreg, will recommend TEE and loop to rule out cardiac cource  LDL 83  HgbA1c 6.4  SCDs for VTE prophylaxis  Diet heart healthy/carb modified Room service appropriate? Yes; Fluid consistency: Thin   aspirin 81 mg daily prior to admission, now on No antithrombotic within 24h of tPA  Patient counseled to  be compliant with her antithrombotic medications  Ongoing aggressive stroke risk factor management  Therapy recommendations:  pending  Disposition:  Pending  Heart palpitation  More frequent for the last one month, especially at night  Exertional SOB  Followed with PCP and put on coreg and getting improved  Has cardiology referral in [place] on 03/13/17  TTE pending  Recommend TEE and loop to rule out afib  Hx of TIA  02/2011 transient right arm weakness - MRI neg, CUS showed right ICA 40-59% stenosis, EF 60-65% - ASA changed to plavix  Hx of BA tip aneurysm  MRA showed stable aneurysm  Following with Dr. Estanislado Pandy  Hx of left CCA to subclavian bypass  MRA showed stable bypass  CTA head and neck pending  Diabetes  HgbA1c 6.4 goal < 7.0  Controlled  Currently on SSI  CBG monitoring  SSI  close PCP follow up  Hypertension Stable Permissive hypertension (OK if <180/105) for 24-48 hours post stroke and then gradually normalized within 5-7 days.  Long term BP goal  normotensive  Hyperlipidemia  Home meds:  pravastatin 10  LDL 83, goal < 70  Now on pravastatin 20  Continue statin at discharge  Tobacco abuse  Current smoker  Smoking cessation counseling provided  Pt is willing to quit  Other Stroke Risk Factors  Advanced age  Coronary artery disease  Other Active Problems  CKD stage II, Cre 1.30-1.25  Hx of polio - right foot DF/PF impairment  Hospital day # 1  This patient is critically ill due to embolic stroke s/p tPA, heart palpitation, hx of BA aneurysm, hx of cca to subclavian bypass and at significant risk of neurological worsening, death form recurrent stroke, hemorrhagic conversion, heart failure. This patient's care requires constant monitoring of vital signs, hemodynamics, respiratory and cardiac monitoring, review of multiple databases, neurological assessment, discussion with family, other specialists and medical decision making of high complexity. I had long discussion with daughter and son and pt at bedside, updated pt current condition, treatment plan and potential prognosis. They expressed understanding and appreciation. I spent 45 minutes of neurocritical care time in the care of this patient.   Rosalin Hawking, MD PhD Stroke Neurology 03/04/2017 11:32 AM    To contact Stroke Continuity provider, please refer to [email or web address] After hours, contact General Neurology

## 2017-03-04 NOTE — Evaluation (Signed)
Speech Language Pathology Evaluation Patient Details Name: Paula Watson MRN: [id number] DOB: [birth year] Today's Date: 03/04/2017 Time: 1194-1740 SLP Time Calculation (min) (ACUTE ONLY): 15 min  Problem List:  Patient Active Problem List   Diagnosis Date Noted  . Stroke (cerebrum) ([place]) 03/03/2017  . Palpitation 02/19/2017  . Frequent falls 02/19/2017  . Appetite loss 02/19/2017  . Nausea & vomiting 02/19/2017  . History of post poliomyelitis muscular atrophy 06/09/2016  . Well woman exam 06/09/2016  . History of stroke 06/09/2016  . Aneurysm of artery ([place]) 06/09/2016  . Sciatica of right side 02/04/2016  . Chronic right-sided low back pain with right-sided sciatica 02/04/2016  . Chronic nonseasonal allergic rhinitis due to pollen 02/04/2016  . COPD exacerbation ([place]) 02/04/2016  . TIA (transient ischemic attack) 02/19/2011  . HTN (hypertension), benign 02/19/2011  . DM (diabetes mellitus) type II controlled with renal manifestation ([place]) 02/19/2011  . Hypokalemia 02/19/2011  . Leukocytosis 02/19/2011  . UTI (lower urinary tract infection) 02/19/2011  . Tobacco abuse 02/19/2011   Past Medical History:  Past Medical History:  Diagnosis Date  . Anemia   . Aneurysm of artery ([place])   . Blood transfusion   . Bronchitis    "have had a couple of times in my lifetime"  . Cancer ([place]) 1985   cervical  . Cataract   . CKD (chronic kidney disease) stage 1, GFR 90 ml/min or greater was told by her PCP   Intolerant to ACE according to her PCP  . Coronary artery disease   . Diabetes mellitus   . GERD (gastroesophageal reflux disease)   . Hemorrhoids   . High cholesterol   . Hyperlipidemia   . Hypertension   . Polio 1953  . Poliomyelitis since 1948   has drop foot on R  . Stroke [facility]) 2007 affected R side  . Vertigo now improved   Past Surgical History:  Past Surgical History:  Procedure Laterality Date  . CAROTID ENDARTERECTOMY Left 2000 L side  . CERVICAL CONIZATION  W/BX  1985  . CORONARY ANGIOPLASTY  stented 2007  . DILATION AND CURETTAGE OF UTERUS  1980's  . foot reconstructjion  1953   right foot; post polio  . IR RADIOLOGIST EVAL & MGMT  07/21/2016  . TONSILLECTOMY  1954   HPI:  71 y.o.femaleCaucasian right-handed female with PMH of CVA in 2007, left CEA, basilar artery aneurysm, hyperlipidemia, hypertension, diabetes mellitus, coronary artery disease, CK D presents with sudden onset inability to lift the left arm, left facial droop and severe slurred speech while watching a gamearound 7 PM. Her symptoms lasted for about 5 minutes and had completelyresolved by the time patient arrived to the emergency room.She was being evaluated for TIA when around 9:15 PM family noted her symptoms have returned and notified the nurse. Symptoms again largely resolved following CT scan which showed no acute infarct. Symptoms again re-occurred and TPA was administered. MRI with Multiple punctate foci of acute subcortical ischemia within both frontal lobes, the right parietal lobe and left temporal lobe. No   Assessment / Plan / Recommendation Clinical Impression  Cognitive-linguistic evaluation complete with patient scoring WFL on all areas addressed. No f/u SLP services indicated.     SLP Assessment  SLP Recommendation/Assessment: Patient does not need any further Speech Lanaguage Pathology Services    Follow Up Recommendations  None          SLP Evaluation Cognition  Overall Cognitive Status: Within Functional Limits for tasks assessed Orientation Level: Oriented  X4       Comprehension  Auditory Comprehension Overall Auditory Comprehension: Appears within functional limits for tasks assessed Visual Recognition/Discrimination Discrimination: Within Function Limits Reading Comprehension Reading Status: Within funtional limits    Expression Expression Primary Mode of Expression: Verbal Verbal Expression Overall Verbal Expression: Appears within  functional limits for tasks assessed   Oral / Motor  Oral Motor/Sensory Function Overall Oral Motor/Sensory Function: Within functional limits Motor Speech Overall Motor Speech: Appears within functional limits for tasks assessed   GO            Gabriel Rainwater MA, CCC-SLP [phone]         Natasha Paulson Meryl 03/04/2017, 9:16 AM

## 2017-03-04 NOTE — Progress Notes (Signed)
OT Cancellation Note  Patient Details Name: Paula Watson MRN: [id number] DOB: 09-Feb-1946   Cancelled Treatment:    Reason Eval/Treat Not Completed: Patient not medically ready(On bedrest)  Newton Hamilton, OT/L  [phone] 03/04/2017 03/04/2017, 7:59 AM

## 2017-03-04 NOTE — Progress Notes (Signed)
    [facility] has been requested to perform a transesophageal echocardiogram on Ms. Paula Watson for stroke.  After careful review of history and examination, the risks and benefits of transesophageal echocardiogram have been explained including risks of esophageal damage, perforation (1:10,000 risk), bleeding, pharyngeal hematoma as well as other potential complications associated with conscious sedation including aspiration, arrhythmia, respiratory failure and death. Alternatives to treatment were discussed, questions were answered. Patient is willing to proceed.  TEE - Dr. Radford Pax @ 11am - 03/06/17.  NPO after midnight. Meds with sips.   Leanor Kail, PA-C 03/04/2017 12:12 PM

## 2017-03-04 NOTE — Progress Notes (Signed)
  Echocardiogram 2D Echocardiogram has been performed.  Paula Watson 03/04/2017, 10:00 AM

## 2017-03-04 NOTE — Evaluation (Signed)
Physical Therapy Evaluation Patient Details Name: Paula Watson MRN: [id number] DOB: 12/11/1945 Today's Date: 03/04/2017   History of Present Illness  71 y.o. female admitted on 03/03/17 with slurred speech and left sided weakness.  Pt given tPA and MRI revealed bil MCA/ACA and MCA/PCA four punctate infarcts embolic pattern concerning for undiagnosed A-fib.  She is due to have a TEE on 11/23 and loop recorder placement.  Pt with significant PMH of vertigo (takes antrivert daily), stroke in 2007, polio affecting R side (with right foot reconstruction AFO and special custom shoe), HTN, DM, CAD, CKD, cervical CA, aneurysm in her brain (stable), anemia, and left carotid endarterectomy.    Clinical Impression  Pt with moderately unstable gait pattern, at risk for falls and did have a recent fall, in fact, while watching her 3 year old grandson.  She reports daily Antivert use for chronic dizziness.  She would benefit from HHPT for balance and gait training at discharge and is having further cardiac workup here.  Of note, when she covers her left eye and looks only out of her right eye it is very blurry which is more than baseline with this episode.   PT to follow acutely for deficits listed below.       Follow Up Recommendations Home health PT;Supervision for mobility/OOB    Equipment Recommendations  None recommended by PT    Recommendations for Other Services   NA    Precautions / Restrictions Precautions Precautions: Fall Precaution Comments: due to dizziness and imbalance.       Mobility  Bed Mobility Overal bed mobility: Modified Independent                Transfers Overall transfer level: Needs assistance Equipment used: None Transfers: Sit to/from Stand Sit to Stand: Min guard         General transfer comment: Min guard assist for safety and balance as when pt first gets up she gets dizzy and has to stand there a minute to get her balance.    Ambulation/Gait Ambulation/Gait assistance: Min assist Ambulation Distance (Feet): 130 Feet Assistive device: 1 person hand held assist Gait Pattern/deviations: Step-through pattern;Staggering left;Staggering right Gait velocity: decreased Gait velocity interpretation: Below normal speed for age/gender General Gait Details: Pt very rigid and not moving her head during gait, looking straight forward.  When asked to look at me and attempt to keep walking pt turns her head and stops walking as it makes her feel very off balance both directions.       Modified Rankin (Stroke Patients Only) Modified Rankin (Stroke Patients Only) Pre-Morbid Rankin Score: No significant disability Modified Rankin: Moderately severe disability     Balance Overall balance assessment: Needs assistance Sitting-balance support: Feet supported;No upper extremity supported Sitting balance-Leahy Scale: Fair     Standing balance support: No upper extremity supported Standing balance-Leahy Scale: Fair                               Pertinent Vitals/Pain Pain Assessment: No/denies pain    Home Living Family/patient expects to be discharged to:: Private residence Living Arrangements: Children;Other relatives Available Help at Discharge: Family;Available 24 hours/day Type of Home: House Home Access: Stairs to enter Entrance Stairs-Rails: Right;Left;Can reach both Entrance Stairs-Number of Steps: 4 Home Layout: One level Home Equipment: Cane - single point      Prior Function Level of Independence: Independent  Comments: cares for her 2 y.o. grandson 2 days per week, drives, no longer works, and is generally independent.      Hand Dominance   Dominant Hand: Right    Extremity/Trunk Assessment   Upper Extremity Assessment Upper Extremity Assessment: Defer to OT evaluation    Lower Extremity Assessment Lower Extremity Assessment: RLE deficits/detail RLE Deficits /  Details: right leg with post polio deformities that is managed well with bracing and corrective shoe (she also had surgery on it years ago).      Cervical / Trunk Assessment Cervical / Trunk Assessment: Normal  Communication   Communication: No difficulties  Cognition Arousal/Alertness: Awake/alert Behavior During Therapy: WFL for tasks assessed/performed Overall Cognitive Status: Within Functional Limits for tasks assessed                                               Assessment/Plan    PT Assessment Patient needs continued PT services  PT Problem List Decreased activity tolerance;Decreased balance;Decreased mobility;Decreased knowledge of use of DME       PT Treatment Interventions DME instruction;Gait training;Stair training;Functional mobility training;Therapeutic activities;Therapeutic exercise;Balance training;Patient/family education;Neuromuscular re-education    PT Goals (Current goals can be found in the Care Plan section)  Acute Rehab PT Goals Patient Stated Goal: to figure out what is going on with her, get the dizziness straightened out PT Goal Formulation: With patient Time For Goal Achievement: 03/18/17 Potential to Achieve Goals: Good    Frequency Min 4X/week           AM-PAC PT "6 Clicks" Daily Activity  Outcome Measure Difficulty turning over in bed (including adjusting bedclothes, sheets and blankets)?: None Difficulty moving from lying on back to sitting on the side of the bed? : None Difficulty sitting down on and standing up from a chair with arms (e.g., wheelchair, bedside commode, etc,.)?: Unable Help needed moving to and from a bed to chair (including a wheelchair)?: A Little Help needed walking in hospital room?: A Little Help needed climbing 3-5 steps with a railing? : A Little 6 Click Score: 18    End of Session Equipment Utilized During Treatment: Gait belt Activity Tolerance: Patient tolerated treatment well Patient  left: in chair;with call bell/phone within reach Nurse Communication: Mobility status PT Visit Diagnosis: Dizziness and giddiness (R42);Other symptoms and signs involving the nervous system (R29.898);History of falling (Z91.81)    Time: 2706-2376 PT Time Calculation (min) (ACUTE ONLY): 40 min   Charges:           Wells Guiles B. Peggy Monk, PT, DPT [phone]   PT Evaluation $PT Eval Moderate Complexity: 1 Mod PT Treatments $Gait Training: 8-22 mins $Therapeutic Activity: 8-22 mins   03/04/2017, 4:15 PM

## 2017-03-04 NOTE — ED Notes (Signed)
Patient returned to room in ED. No neuro changes. VSS. Family at bedside.

## 2017-03-05 ENCOUNTER — Encounter (HOSPITAL_COMMUNITY): Payer: Self-pay

## 2017-03-05 DIAGNOSIS — F17200 Nicotine dependence, unspecified, uncomplicated: Secondary | ICD-10-CM

## 2017-03-05 DIAGNOSIS — I739 Peripheral vascular disease, unspecified: Secondary | ICD-10-CM

## 2017-03-05 DIAGNOSIS — I6523 Occlusion and stenosis of bilateral carotid arteries: Secondary | ICD-10-CM

## 2017-03-05 LAB — BASIC METABOLIC PANEL
ANION GAP: 7 (ref 5–15)
BUN: 18 mg/dL (ref 6–20)
CALCIUM: 8.8 mg/dL — AB (ref 8.9–10.3)
CO2: 23 mmol/L (ref 22–32)
CREATININE: 1.2 mg/dL — AB (ref 0.44–1.00)
Chloride: 109 mmol/L (ref 101–111)
GFR, EST AFRICAN AMERICAN: 51 mL/min — AB (ref >60)
GFR, EST NON AFRICAN AMERICAN: 44 mL/min — AB (ref >60)
Glucose, Bld: 91 mg/dL (ref 65–99)
Potassium: 3.4 mmol/L — ABNORMAL LOW (ref 3.5–5.1)
SODIUM: 139 mmol/L (ref 135–145)

## 2017-03-05 LAB — CBC
HCT: 39.4 % (ref 36.0–46.0)
Hemoglobin: 13 g/dL (ref 12.0–15.0)
MCH: 28.3 pg (ref 26.0–34.0)
MCHC: 33 g/dL (ref 30.0–36.0)
MCV: 85.8 fL (ref 78.0–100.0)
PLATELETS: 212 10*3/uL (ref 150–400)
RBC: 4.59 MIL/uL (ref 3.87–5.11)
RDW: 13.4 % (ref 11.5–15.5)
WBC: 10.6 10*3/uL — ABNORMAL HIGH (ref 4.0–10.5)

## 2017-03-05 LAB — GLUCOSE, CAPILLARY
GLUCOSE-CAPILLARY: 107 mg/dL — AB (ref 65–99)
Glucose-Capillary: 128 mg/dL — ABNORMAL HIGH (ref 65–99)
Glucose-Capillary: 177 mg/dL — ABNORMAL HIGH (ref 65–99)
Glucose-Capillary: 217 mg/dL — ABNORMAL HIGH (ref 65–99)

## 2017-03-05 MED ORDER — ASPIRIN EC 325 MG PO TBEC
325.0000 mg | DELAYED_RELEASE_TABLET | Freq: Every day | ORAL | Status: DC
  Administered 2017-03-05 – 2017-03-08 (×4): 325 mg via ORAL
  Filled 2017-03-05 (×4): qty 1

## 2017-03-05 MED ORDER — CARVEDILOL 6.25 MG PO TABS
6.2500 mg | ORAL_TABLET | Freq: Two times a day (BID) | ORAL | Status: DC
  Administered 2017-03-05 – 2017-03-08 (×7): 6.25 mg via ORAL
  Filled 2017-03-05 (×7): qty 1

## 2017-03-05 MED ORDER — CLOPIDOGREL BISULFATE 75 MG PO TABS
75.0000 mg | ORAL_TABLET | Freq: Every day | ORAL | Status: DC
Start: 2017-03-05 — End: 2017-03-08
  Administered 2017-03-05 – 2017-03-08 (×4): 75 mg via ORAL
  Filled 2017-03-05 (×4): qty 1

## 2017-03-05 MED ORDER — POTASSIUM CHLORIDE CRYS ER 20 MEQ PO TBCR
40.0000 meq | EXTENDED_RELEASE_TABLET | ORAL | Status: AC
  Administered 2017-03-05 (×2): 40 meq via ORAL
  Filled 2017-03-05 (×2): qty 2

## 2017-03-05 MED ORDER — SODIUM CHLORIDE 0.9 % IV SOLN: INTRAVENOUS | Status: DC

## 2017-03-05 MED ORDER — BISACODYL 5 MG PO TBEC
5.0000 mg | DELAYED_RELEASE_TABLET | Freq: Every day | ORAL | Status: DC | PRN
  Administered 2017-03-05: 5 mg via ORAL
  Filled 2017-03-05: qty 1

## 2017-03-05 NOTE — Progress Notes (Signed)
STROKE TEAM PROGRESS NOTE   SUBJECTIVE (INTERVAL HISTORY) Her son is at the bedside.  Pt is sitting in bed for breakfast. I relayed the CTA head and neck result to her. Currently, her left subclavian to CCA bypass has occluded. Right ICA proximal now 90% stenosis. This may likely to explain her stroke instead of possible afib. Will do cerebral angio tomorrow and cancel TEE for now. Pt and son agreed    OBJECTIVE Temp:  [98 F (36.7 C)-98.9 F (37.2 C)] 98.2 F (36.8 C) (11/22 0800) Pulse Rate:  [54-83] 63 (11/22 1100) Cardiac Rhythm: Normal sinus rhythm (11/22 0800) Resp:  [11-22] 15 (11/22 1100) BP: (115-199)/(34-89) 123/46 (11/22 1100) SpO2:  [92 %-98 %] 94 % (11/22 1100)  Recent Labs  Lab 03/04/17 1219 03/04/17 1626 03/04/17 2019 03/04/17 2021 03/05/17 0906  GLUCAP 130* 147* 189* 161* 107*   Recent Labs  Lab 03/03/17 2111 03/03/17 2124 03/04/17 0454 03/05/17 0419  NA 137 140 140 139  K 4.0 3.9 3.7 3.4*  CL 108 107 112* 109  CO2 22  --  17* 23  GLUCOSE 126* 125* 98 91  BUN 20 22* 22* 18  CREATININE 1.35* 1.30* 1.25* 1.20*  CALCIUM 9.2  --  8.7* 8.8*   Recent Labs  Lab 03/03/17 2111  AST 21  ALT 14  ALKPHOS 71  BILITOT 0.7  PROT 6.9  ALBUMIN 4.0   Recent Labs  Lab 03/03/17 2111 03/03/17 2124 03/04/17 0454 03/05/17 0419  WBC 10.0  --  9.6 10.6*  NEUTROABS 5.4  --   --   --   HGB 13.9 14.3 13.3 13.0  HCT 42.1 42.0 40.7 39.4  MCV 85.7  --  86.0 85.8  PLT 265  --  218 212   No results for input(s): CKTOTAL, CKMB, CKMBINDEX, TROPONINI in the last 168 hours. Recent Labs    03/03/17 2111  LABPROT 12.3  INR 0.92   No results for input(s): COLORURINE, LABSPEC, PHURINE, GLUCOSEU, HGBUR, BILIRUBINUR, KETONESUR, PROTEINUR, UROBILINOGEN, NITRITE, LEUKOCYTESUR in the last 72 hours.  Invalid input(s): APPERANCEUR     Component Value Date/Time   CHOL 152 03/04/2017 0454   CHOL 161 02/19/2017 1228   TRIG 139 03/04/2017 0454   HDL 41 03/04/2017 0454    HDL 48 02/19/2017 1228   CHOLHDL 3.7 03/04/2017 0454   VLDL 28 03/04/2017 0454   LDLCALC 83 03/04/2017 0454   LDLCALC 80 02/19/2017 1228   Lab Results  Component Value Date   HGBA1C 6.4 (H) 03/04/2017   No results found for: LABOPIA, COCAINSCRNUR, LABBENZ, AMPHETMU, THCU, LABBARB  No results for input(s): ETH in the last 168 hours.  I have personally reviewed the radiological images below and agree with the radiology interpretations.  Mri brain and Mra Head and neck Wo Contrast 03/04/2017 IMPRESSION: 1. Multiple punctate foci of acute subcortical ischemia within both frontal lobes, the right parietal lobe and left temporal lobe. No large territory infarct. The distribution of lesions is suggestive of a cardiac or aortic embolic source. 2. Unchanged moderate to severe multifocal narrowing of the internal carotid arteries, worst at the left clinoid segment. 3. 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 4. Unchanged loss of flow related enhancement of the distal right vertebral artery. MRA of the neck shows no flow related enhancement along the cervical segments of the right vertebral artery. This may indicate occlusion, but the MRA is degraded by the  lack of IV contrast. This could be further characterized with CTA of the neck or ultrasound. 5. Chronic occlusion of the left common carotid artery, status post subclavian to carotid bypass. The cervical portions of the internal carotid arteries are poorly assessed due to lack of IV contrast. This also could be further characterized with CTA of the neck or ultrasound.   Ct Head Code Stroke Wo Contrast 03/03/2017 IMPRESSION: 1. No acute intracranial abnormality identified. 2. Stable small chronic infarction in right cerebellar hemisphere and chronic lacunar infarcts in basal ganglia. 3. Stable chronic microvascular ischemic changes and parenchymal volume loss of the brain. 4.  ASPECTS is 10.   Ct Angio Head and neck W Or Wo Contrast 03/04/2017 IMPRESSION: 1. No intracranial large vessel occlusion.  No acute hemorrhage. 2. Proximal right internal carotid artery stenosis measuring 90% secondary to the presence of predominantly calcified plaque. 3. Chronic occlusion of the left common carotid artery at its origin. There is no flow seen within the left subclavian to ICA graft. The left carotid bifurcation and internal and external carotid arteries do show opacification. There is approximately 75% stenosis of the proximal left internal carotid artery. This measurement is likely underestimating the degree of stenosis, because of the small caliber of the distal ICA. 4. Bilateral moderate to severe atherosclerotic narrowing of the internal carotid arteries at the skullbase. 5. 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 Electronically Signed   By: Ulyses Jarred M.D.   On: 03/04/2017 23:19   2D Echocardiogram - Left ventricle: The cavity size was normal. Wall thickness was   increased in a pattern of moderate LVH. Systolic function was   normal. The estimated ejection fraction was in the range of 60%   to 65%. Wall motion was normal; there were no regional wall   motion abnormalities. Left ventricular diastolic function   parameters were normal. - Atrial septum: No defect or patent foramen ovale was identified. Impressions: - No cardiac source of emboli was indentified.   PHYSICAL EXAM  Temp:  [98 F (36.7 C)-98.9 F (37.2 C)] 98.2 F (36.8 C) (11/22 0800) Pulse Rate:  [54-83] 63 (11/22 1100) Resp:  [11-22] 15 (11/22 1100) BP: (115-199)/(34-89) 123/46 (11/22 1100) SpO2:  [92 %-98 %] 94 % (11/22 1100)  General - Well nourished, well developed, in no apparent distress.  Ophthalmologic - fundi not visualized due to noncooperation.  Cardiovascular - Regular rate and rhythm with no murmur.  Mental Status -  Level of arousal and orientation to  time, place, and person were intact. Language including expression, naming, repetition, comprehension was assessed and found intact. Fund of Knowledge was assessed and was intact.  Cranial Nerves II - XII - II - Visual field intact OU. III, IV, VI - Extraocular movements intact. V - Facial sensation intact bilaterally. VII - Facial movement intact bilaterally. VIII - Hearing & vestibular intact bilaterally. X - Palate elevates symmetrically. XI - Chin turning & shoulder shrug intact bilaterally. XII - Tongue protrusion intact.  Motor Strength - The patient's strength was normal in all extremities except right foot 3/5 PF and DF due to hx of polio and pronator drift was absent.  Bulk was normal and fasciculations were absent.   Motor Tone - Muscle tone was assessed at the neck and appendages and was normal.  Reflexes - The patient's reflexes were symmetrical in all extremities and she had no pathological reflexes.  Sensory - Light touch, temperature/pinprick were assessed and were symmetrical.  Coordination - The patient had normal movements in the hands with no ataxia or dysmetria.  Tremor was absent.  Gait and Station - deferred.   ASSESSMENT/PLAN Ms. MARSHAL SCHRECENGOST is a 71 y.o. female with history of CKD, CAD, DM, HLD, HTN, polil, TIA, BA tip aneurysm admitted for recurrent left arm and facial weakness, slurry speech. TPA given.    Stroke:  bilateral MCA/ACA and MCA/PCA four punctate infarcts - could be due to high grade stenosis of right ICA and occluded left subclavian to CCA bypass with left ICA > 75% stenosis. Cerebral angio has been ordered. However, cardioemoblic can not be completely ruled out due to episodes of heart palpitation at home.   Resultant back to baseline  MRI 4 punctate foci of acute subcortical ischemia within both frontal lobes, the right parietal lobe and left temporal lobe  MRA  11mm BA tip aneurysm, stable  CTA head and neck high grade stenosis of  right ICA and occluded left subclavian to CCA bypass with left ICA > 75% stenosis. Stable BA aneurysm.  2D Echo EF 60-65%  Will plan for cerebral angio first tomorrow. TEE and loop cancelled for now.  LDL 83  HgbA1c 6.4  SCDs for VTE prophylaxis Diet NPO time specified Except for: Sips with Meds  Diet Heart Room service appropriate? Yes; Fluid consistency: Thin   aspirin 81 mg daily prior to admission, now on ASA and plavix dual antiplatelet.   Patient counseled to be compliant with her antithrombotic medications  Ongoing aggressive stroke risk factor management  Therapy recommendations:  HH PT  Disposition:  Pending  Carotid stenosis bilateral  CUS 10/2005 right ICA patent  CUS 08/2014 right ICA 50-69% stenosis but could be underestimating due to plaque  Right ICA 90% stenosis on CTA this time.  Left subclavian to CCA bypass in 2000  10/2005 underwent left subclavian artery angioplasty due to stenosis  08/2014 CUS showed bypass was patent with low resistance antegrade flow   Current CTA showed bypass closed.  Will do cerebral angio in am with Dr. Estanislado Pandy  Heart palpitation  More frequent for the last one month, especially at night  Followed with PCP and put on coreg and getting improved  Has cardiology referral in [place] on 03/13/17  TTE and loop cancelled for now depending on cerebral angiogram result  Hx of TIA  02/2011 transient right arm weakness - MRI neg, CUS showed right ICA 40-59% stenosis, EF 60-65% - ASA changed to plavix  Hx of BA tip aneurysm  MRA showed stable aneurysm  Following with Dr. Estanislado Pandy  Diabetes  HgbA1c 6.4 goal < 7.0  Controlled  Currently on SSI  CBG monitoring  SSI  close PCP follow up  Hypertension Stable, on the high side Permissive hypertension (OK if <180/105) for 24-48 hours post stroke and then gradually normalized within 5-7 days. Coreg resumed, but other home BP meds on hold  Long term BP goal 130-160  now due to b/l carotid disease  Hyperlipidemia  Home meds:  pravastatin 10  LDL 83, goal < 70  Now on pravastatin 20  Continue statin at discharge  Tobacco abuse  Current smoker  Smoking cessation counseling provided  Pt is willing to quit  Other Stroke Risk Factors  Advanced age  Coronary artery disease  Other Active Problems  CKD stage II, Cre 1.30-1.25  Hx of polio - right foot DF/PF impairment  Hospital day # 2  This patient is critically ill due to embolic stroke s/p  tPA, heart palpitation, hx of BA aneurysm, hx of cca to subclavian bypass and at significant risk of neurological worsening, death form recurrent stroke, hemorrhagic conversion, heart failure. This patient's care requires constant monitoring of vital signs, hemodynamics, respiratory and cardiac monitoring, review of multiple databases, neurological assessment, discussion with family, other specialists and medical decision making of high complexity. I had long discussion with pt and son and pt at bedside, updated pt current condition, treatment plan and potential prognosis. They agreed with DSA tomorrow, and expressed understanding and appreciation. I spent 45 minutes of neurocritical care time in the care of this patient.   Rosalin Hawking, MD PhD Stroke Neurology 03/05/2017 11:28 AM    To contact Stroke Continuity provider, please refer to [email or web address] After hours, contact General Neurology

## 2017-03-05 NOTE — Consult Note (Signed)
Chief Complaint: Patient was seen in consultation today for cerebral arteriogram Chief Complaint  Patient presents with  . Code Stroke   at the request of Dr Lavera Guise   Supervising Physician: Luanne Bras  Patient Status: [facility] - In-pt  History of Present Illness: Paula Watson is a 71 y.o. female   Left facial drop, left UE weakness and right ptosis yesterday, on and off, received tPA. She has had left carotid to subclavian bypass in the past. Recently, she developed exertional SOB, dizziness on walking and heart palpitation  CTA 11//21: IMPRESSION: 1. No intracranial large vessel occlusion.  No acute hemorrhage. 2. Proximal right internal carotid artery stenosis measuring 90% secondary to the presence of predominantly calcified plaque. 3. Chronic occlusion of the left common carotid artery at its origin. There is no flow seen within the left subclavian to ICA graft. The left carotid bifurcation and internal and external carotid arteries do show opacification. There is approximately 75% stenosis of the proximal left internal carotid artery. This measurement is likely underestimating the degree of stenosis, because of the small caliber of the distal ICA. 4. Bilateral moderate to severe atherosclerotic narrowing of the internal carotid arteries at the skullbase. 5. 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  Known to [facility] Dr Estanislado Pandy note 07/2016: The basilar apex aneurysm remains well defined measuring approximately 3 to 3.5 mm, and the right paraclinoid aneurysm measuring approximately 3 to 3.5 mm. Compared to the previous angiogram of 2007 the right paraclinoid aneurysm appears to be mildly increased in size though it remains smooth and well defined. This measures approximately 3 to 3.5 mm also. Mild to moderate right middle cerebral artery proximal stenosis appears stable as well Continued surveillance was determined best at that time --  and agreeable by pt and family.  Now scheduled for cerebral arteriogram as per Dr Erlinda Hong request Approved with Dr Estanislado Pandy  Past Medical History:  Diagnosis Date  . Anemia   . Aneurysm of artery ([place])   . Blood transfusion   . Bronchitis    "have had a couple of times in my lifetime"  . Cancer ([place]) 1985   cervical  . Cataract   . CKD (chronic kidney disease) stage 1, GFR 90 ml/min or greater was told by her PCP   Intolerant to ACE according to her PCP  . Coronary artery disease   . Diabetes mellitus   . GERD (gastroesophageal reflux disease)   . Hemorrhoids   . High cholesterol   . Hyperlipidemia   . Hypertension   . Polio 1953  . Poliomyelitis since 1948   has drop foot on R  . Stroke [facility] 2007 affected R side  . Vertigo now improved    Past Surgical History:  Procedure Laterality Date  . CAROTID ENDARTERECTOMY Left 2000 L side  . CERVICAL CONIZATION W/BX  1985  . CORONARY ANGIOPLASTY  stented 2007  . DILATION AND CURETTAGE OF UTERUS  1980's  . foot reconstructjion  1953   right foot; post polio  . IR RADIOLOGIST EVAL & MGMT  07/21/2016  . TONSILLECTOMY  1954    Allergies: Penicillins  Medications: Prior to Admission medications   Medication Sig Start Date End Date Taking? Authorizing Provider  albuterol (PROVENTIL HFA;VENTOLIN HFA) 108 (90 Base) MCG/ACT inhaler Inhale 2 puffs into the lungs every 6 (six) hours as needed for wheezing or shortness of breath. 02/04/16  Yes Terald Sleeper, PA-C  amLODipine (NORVASC) 10 MG tablet  Take 1 tablet (10 mg total) by mouth daily. 03/03/17  Yes Terald Sleeper, PA-C  aspirin 81 MG chewable tablet Chew 81 mg by mouth 2 (two) times daily.   Yes [provider]  carvedilol (COREG) 6.25 MG tablet Take 1 tablet (6.25 mg total) 2 (two) times daily with a meal by mouth. 02/19/17  Yes Terald Sleeper, PA-C  clotrimazole-betamethasone (LOTRISONE) lotion APPLY TOPICALLY TWO TIMES DAILY 09/29/16  Yes Terald Sleeper, PA-C  famotidine  (PEPCID) 20 MG tablet Take 2 tablets (40 mg total) by mouth daily. Patient taking differently: Take 40 mg by mouth every evening.  06/09/16  Yes Terald Sleeper, PA-C  lisinopril (PRINIVIL,ZESTRIL) 20 MG tablet Take 1 tablet (20 mg total) by mouth daily. 06/09/16  Yes Terald Sleeper, PA-C  meclizine (ANTIVERT) 25 MG tablet Take 25 mg by mouth 2 (two) times daily as needed for dizziness.    Yes [provider]  metFORMIN (GLUCOPHAGE-XR) 500 MG 24 hr tablet Take 1 tablet (500 mg total) by mouth 2 (two) times daily. 06/09/16  Yes Terald Sleeper, PA-C  mupirocin cream (BACTROBAN) 2 % Apply 1 application topically 2 (two) times daily. Patient taking differently: Apply 1 application topically 2 (two) times daily as needed (pressure callus).  06/09/16  Yes Terald Sleeper, PA-C  pravastatin (PRAVACHOL) 10 MG tablet Take 1 tablet (10 mg total) by mouth daily. 06/09/16  Yes Terald Sleeper, PA-C  fluticasone (FLONASE) 50 MCG/ACT nasal spray Place 1 spray into both nostrils 2 (two) times daily. Patient not taking: Reported on 02/19/2017 02/04/16   Terald Sleeper, PA-C  hydrocortisone (ANUSOL-HC) 2.5 % rectal cream Place 1 application rectally 2 (two) times daily. Patient not taking: Reported on 03/04/2017 06/09/16   Terald Sleeper, PA-C     Family History  Problem Relation Age of Onset  . Coronary artery disease Mother   . Heart disease Mother   . Diabetes type II Father   . Cancer Father        brain stem  . Brain cancer Father   . Diabetes Father   . Cancer Sister        breast with recurrance  . Diabetes Sister   . Stroke Sister   . Coronary artery disease Brother   . COPD Maternal Grandmother        breast  . COPD Paternal Grandmother        breast  . Heart disease Brother   . Heart attack Brother   . COPD Other     Social History   Socioeconomic History  . Marital status: Divorced    Spouse name: None  . Number of children: None  . Years of education: None  . Highest education  level: None  Social Needs  . Financial resource strain: None  . Food insecurity - worry: None  . Food insecurity - inability: None  . Transportation needs - medical: None  . Transportation needs - non-medical: None  Occupational History  . None  Tobacco Use  . Smoking status: Current Every Day Smoker    Packs/day: 0.25    Years: 50.00    Pack years: 12.50    Types: Cigarettes  . Smokeless tobacco: Never Used  . Tobacco comment: "working hard to quit"  Substance and Sexual Activity  . Alcohol use: No  . Drug use: No  . Sexual activity: No    Birth control/protection: Post-menopausal  Other Topics Concern  . None  Social  History Narrative  . None    Review of Systems: A 12 point ROS discussed and pertinent positives are indicated in the HPI above.  All other systems are negative.  Review of Systems  Constitutional: Positive for fatigue. Negative for activity change and fever.  HENT: Negative for tinnitus.   Eyes: Negative for visual disturbance.  Cardiovascular: Negative for chest pain.  Gastrointestinal: Negative for abdominal pain.  Musculoskeletal: Negative for back pain.  Neurological: Positive for weakness.  Psychiatric/Behavioral: Negative for behavioral problems and confusion.    Vital Signs: BP (!) 123/46   Pulse 63   Temp 98.2 F (36.8 C) (Oral)   Resp 15   Ht 5\' 2"  (1.575 m)   Wt 154 lb 1.6 oz (69.9 kg)   SpO2 94%   BMI 28.19 kg/m   Physical Exam  Constitutional: She is oriented to person, place, and time. She appears well-nourished.  Cardiovascular: Normal rate and regular rhythm.  Pulmonary/Chest: Effort normal and breath sounds normal.  Abdominal: Soft. Bowel sounds are normal.  Musculoskeletal: Normal range of motion.  Neurological: She is alert and oriented to person, place, and time.  Skin: Skin is warm and dry.  Psychiatric: She has a normal mood and affect. Her behavior is normal. Judgment and thought content normal.  Nursing note and  vitals reviewed.   Imaging: Ct Angio Head W Or Wo Contrast  Result Date: 03/04/2017 CLINICAL DATA:  24 hours status post tPA. EXAM: CT ANGIOGRAPHY HEAD AND NECK TECHNIQUE: Multidetector CT imaging of the head and neck was performed using the standard protocol during bolus administration of intravenous contrast. Multiplanar CT image reconstructions and MIPs were obtained to evaluate the vascular anatomy. Carotid stenosis measurements (when applicable) are obtained utilizing NASCET criteria, using the distal internal carotid diameter as the denominator. CONTRAST:  58mL ISOVUE-370 IOPAMIDOL (ISOVUE-370) INJECTION 76% COMPARISON:  Brain MRI 03/03/2017 FINDINGS: CTA NECK FINDINGS Aortic arch: There is moderate calcific atherosclerosis of the aortic arch. There is no aneurysm, dissection or hemodynamically significant stenosis of the visualized ascending aorta and aortic arch. The left common carotid artery is occluded at its origin. There is multifocal atherosclerotic calcification within both proximal subclavian arteries without high-grade stenosis. Right carotid system: The origin of the right common carotid artery is widely patent. There is multifocal atherosclerotic plaque throughout the right common carotid artery. Just proximal to the right carotid bifurcation is a large amount of predominantly calcified plaque that results in 90% stenosis of the proximal right internal carotid artery. The distal right ICA is normal. Left carotid system: The left common carotid artery is occluded at its origin. There is a left subclavian 2 internal carotid artery bypass graft, but no flow is seen within the graft. There is enhancement of the left carotid bifurcation with normal appearance of the external carotid artery. There is a large amount of mixed calcified and noncalcified plaque within the proximal left internal carotid artery that causes a stenosis of approximately 75%. This measurement is deceptive because of the  small caliber of the distal left ICA. Vertebral arteries: The vertebral system is left dominant. The right vertebral artery is diminutive throughout its entire course. The origin of the left vertebral artery is normal. There is multifocal severe stenosis of the V3 and V4 segments of the right vertebral artery, with no significant opacification of the V4 segment. The dominant left vertebral artery is normal along its entire cervical course. There is atherosclerotic calcification within the left V4 segment without hemodynamically significant stenosis. Skeleton: There  is no bony spinal canal stenosis. No lytic or blastic lesions. Other neck: The nasopharynx is clear. The oropharynx and hypopharynx are normal. The epiglottis is normal. The supraglottic larynx, glottis and subglottic larynx are normal. No retropharyngeal collection. The parapharyngeal spaces are preserved. The parotid and submandibular glands are normal. No sialolithiasis or salivary ductal dilatation. The thyroid gland is normal. There is no cervical lymphadenopathy. Upper chest: Mild emphysema. Review of the MIP images confirms the above findings CTA HEAD FINDINGS Anterior circulation: --Intracranial internal carotid arteries: There is severe narrowing of the right internal carotid artery at the skullbase, worst at the proximal cavernous and clinoid segments. The left cavernous ICA is also severely narrowed. Unchanged appearance of clinoid segment right ICA 2 mm aneurysm. --Anterior cerebral arteries: Congenitally absent left A1 segment. Normal anterior communicating artery. Distal anterior cerebral arteries are normal. --Middle cerebral arteries: Normal. --Posterior communicating arteries: Absent bilaterally. Posterior circulation: --Posterior cerebral arteries: Normal. --Superior cerebellar arteries: Normal. --Basilar artery: Unchanged appearance of basilar tip aneurysm. --Anterior inferior cerebellar arteries: Normal. --Posterior inferior cerebellar  arteries: Normal. Venous sinuses: As permitted by contrast timing, patent. Anatomic variants: Congenitally absent left anterior cerebral artery A1 segment. Delayed phase: No parenchymal contrast enhancement.  No hemorrhage. Review of the MIP images confirms the above findings IMPRESSION: 1. No intracranial large vessel occlusion.  No acute hemorrhage. 2. Proximal right internal carotid artery stenosis measuring 90% secondary to the presence of predominantly calcified plaque. 3. Chronic occlusion of the left common carotid artery at its origin. There is no flow seen within the left subclavian to ICA graft. The left carotid bifurcation and internal and external carotid arteries do show opacification. There is approximately 75% stenosis of the proximal left internal carotid artery. This measurement is likely underestimating the degree of stenosis, because of the small caliber of the distal ICA. 4. Bilateral moderate to severe atherosclerotic narrowing of the internal carotid arteries at the skullbase. 5. 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 Electronically Signed   By: Ulyses Jarred M.D.   On: 03/04/2017 23:19   Ct Angio Neck W Or Wo Contrast  Result Date: 03/04/2017 CLINICAL DATA:  24 hours status post tPA. EXAM: CT ANGIOGRAPHY HEAD AND NECK TECHNIQUE: Multidetector CT imaging of the head and neck was performed using the standard protocol during bolus administration of intravenous contrast. Multiplanar CT image reconstructions and MIPs were obtained to evaluate the vascular anatomy. Carotid stenosis measurements (when applicable) are obtained utilizing NASCET criteria, using the distal internal carotid diameter as the denominator. CONTRAST:  26mL ISOVUE-370 IOPAMIDOL (ISOVUE-370) INJECTION 76% COMPARISON:  Brain MRI 03/03/2017 FINDINGS: CTA NECK FINDINGS Aortic arch: There is moderate calcific atherosclerosis of the aortic arch. There is no aneurysm, dissection or hemodynamically  significant stenosis of the visualized ascending aorta and aortic arch. The left common carotid artery is occluded at its origin. There is multifocal atherosclerotic calcification within both proximal subclavian arteries without high-grade stenosis. Right carotid system: The origin of the right common carotid artery is widely patent. There is multifocal atherosclerotic plaque throughout the right common carotid artery. Just proximal to the right carotid bifurcation is a large amount of predominantly calcified plaque that results in 90% stenosis of the proximal right internal carotid artery. The distal right ICA is normal. Left carotid system: The left common carotid artery is occluded at its origin. There is a left subclavian 2 internal carotid artery bypass graft, but no flow is seen within the graft. There is enhancement of the  left carotid bifurcation with normal appearance of the external carotid artery. There is a large amount of mixed calcified and noncalcified plaque within the proximal left internal carotid artery that causes a stenosis of approximately 75%. This measurement is deceptive because of the small caliber of the distal left ICA. Vertebral arteries: The vertebral system is left dominant. The right vertebral artery is diminutive throughout its entire course. The origin of the left vertebral artery is normal. There is multifocal severe stenosis of the V3 and V4 segments of the right vertebral artery, with no significant opacification of the V4 segment. The dominant left vertebral artery is normal along its entire cervical course. There is atherosclerotic calcification within the left V4 segment without hemodynamically significant stenosis. Skeleton: There is no bony spinal canal stenosis. No lytic or blastic lesions. Other neck: The nasopharynx is clear. The oropharynx and hypopharynx are normal. The epiglottis is normal. The supraglottic larynx, glottis and subglottic larynx are normal. No  retropharyngeal collection. The parapharyngeal spaces are preserved. The parotid and submandibular glands are normal. No sialolithiasis or salivary ductal dilatation. The thyroid gland is normal. There is no cervical lymphadenopathy. Upper chest: Mild emphysema. Review of the MIP images confirms the above findings CTA HEAD FINDINGS Anterior circulation: --Intracranial internal carotid arteries: There is severe narrowing of the right internal carotid artery at the skullbase, worst at the proximal cavernous and clinoid segments. The left cavernous ICA is also severely narrowed. Unchanged appearance of clinoid segment right ICA 2 mm aneurysm. --Anterior cerebral arteries: Congenitally absent left A1 segment. Normal anterior communicating artery. Distal anterior cerebral arteries are normal. --Middle cerebral arteries: Normal. --Posterior communicating arteries: Absent bilaterally. Posterior circulation: --Posterior cerebral arteries: Normal. --Superior cerebellar arteries: Normal. --Basilar artery: Unchanged appearance of basilar tip aneurysm. --Anterior inferior cerebellar arteries: Normal. --Posterior inferior cerebellar arteries: Normal. Venous sinuses: As permitted by contrast timing, patent. Anatomic variants: Congenitally absent left anterior cerebral artery A1 segment. Delayed phase: No parenchymal contrast enhancement.  No hemorrhage. Review of the MIP images confirms the above findings IMPRESSION: 1. No intracranial large vessel occlusion.  No acute hemorrhage. 2. Proximal right internal carotid artery stenosis measuring 90% secondary to the presence of predominantly calcified plaque. 3. Chronic occlusion of the left common carotid artery at its origin. There is no flow seen within the left subclavian to ICA graft. The left carotid bifurcation and internal and external carotid arteries do show opacification. There is approximately 75% stenosis of the proximal left internal carotid artery. This measurement is  likely underestimating the degree of stenosis, because of the small caliber of the distal ICA. 4. Bilateral moderate to severe atherosclerotic narrowing of the internal carotid arteries at the skullbase. 5. 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 Electronically Signed   By: Ulyses Jarred M.D.   On: 03/04/2017 23:19   Mr Jodene Nam Head Wo Contrast  Result Date: 03/04/2017 CLINICAL DATA:  Difficulty speaking. Facial droop. Left arm weakness. EXAM: MRI HEAD WITHOUT CONTRAST MRA HEAD WITHOUT CONTRAST MRA NECK WITHOUT CONTRAST TECHNIQUE: Multiplanar, multiecho pulse sequences of the brain and surrounding structures were obtained without intravenous contrast. Angiographic images of the Circle of Willis were obtained using MRA technique without intravenous contrast. Angiographic images of the neck were obtained using MRA technique without intravenous contrast. Carotid stenosis measurements (when applicable) are obtained utilizing NASCET criteria, using the distal internal carotid diameter as the denominator. COMPARISON:  Head CT 03/03/2017 Brain MRI 07/11/2016 FINDINGS: MRI HEAD FINDINGS Brain: The midline structures are normal.  There are multiple bilateral punctate foci of abnormal diffusion restriction in subcortical locations of both frontal lobes, the right parietal lobe and the posterior left temporal lobe. There is no large territory infarct. There are multiple old bilateral cerebellar and basal ganglia lacunar infarcts. The there is multifocal white matter hyperintensity suggesting chronic ischemic microangiopathy. No mass lesion. No chronic microhemorrhage or cerebral amyloid angiopathy. No hydrocephalus, age advanced atrophy or lobar predominant volume loss. No dural abnormality or extra-axial collection. Skull and upper cervical spine: The visualized skull base, calvarium, upper cervical spine and extracranial soft tissues are normal. Sinuses/Orbits: No fluid levels or advanced mucosal  thickening. No mastoid effusion. Normal orbits. MRA HEAD FINDINGS Intracranial internal carotid arteries: There is multifocal moderate to severe narrowing of the internal carotid arteries, worst at the left clinoid segment. This is unchanged. 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 Anterior cerebral arteries: Congenitally absent left A1 segment. Normal anterior communicating artery and A2 segments. Middle cerebral arteries: Mild distal atherosclerotic irregularity. No occlusion or high-grade stenosis. Posterior communicating arteries: Absent bilaterally. Posterior cerebral arteries: Normal. Basilar artery: There is an anteriorly projecting outpouching from the tip of the basilar artery that measures 2 mm at its base and 3 mm base to apex. This is unchanged. Vertebral arteries: Left dominant. No flow related enhancement is seen in the right vertebral artery, unchanged. Superior cerebellar arteries: Normal. Anterior inferior cerebellar arteries: Not clearly seen, which is not uncommon. Posterior inferior cerebellar arteries: Poorly visualized right PICA. Normal left. MRA NECK FINDINGS Noncontrast MRA of the neck with moderate artifact obscuring details. Right carotid system: Normal course and caliber without stenosis or evidence of dissection. Left carotid system: There is chronic occlusion of the left common carotid artery. The patient is status post subclavian to parotid bypass graft, which is not adequately visualized on this study. Vertebral arteries: The left vertebral artery is normal. There is little to no flow related enhancement seen within the right vertebral artery. IMPRESSION: 1. Multiple punctate foci of acute subcortical ischemia within both frontal lobes, the right parietal lobe and left temporal lobe. No large territory infarct. The distribution of lesions is suggestive of a cardiac or aortic embolic source. 2. Unchanged moderate to severe multifocal narrowing of the internal  carotid arteries, worst at the left clinoid segment. 3. 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 4. Unchanged loss of flow related enhancement of the distal right vertebral artery. MRA of the neck shows no flow related enhancement along the cervical segments of the right vertebral artery. This may indicate occlusion, but the MRA is degraded by the lack of IV contrast. This could be further characterized with CTA of the neck or ultrasound. 5. Chronic occlusion of the left common carotid artery, status post subclavian to carotid bypass. The cervical portions of the internal carotid arteries are poorly assessed due to lack of IV contrast. This also could be further characterized with CTA of the neck or ultrasound. Electronically Signed   By: Ulyses Jarred M.D.   On: 03/04/2017 00:47   Mr Jodene Nam Neck Wo Contrast  Result Date: 03/04/2017 CLINICAL DATA:  Difficulty speaking. Facial droop. Left arm weakness. EXAM: MRI HEAD WITHOUT CONTRAST MRA HEAD WITHOUT CONTRAST MRA NECK WITHOUT CONTRAST TECHNIQUE: Multiplanar, multiecho pulse sequences of the brain and surrounding structures were obtained without intravenous contrast. Angiographic images of the Circle of Willis were obtained using MRA technique without intravenous contrast. Angiographic images of  the neck were obtained using MRA technique without intravenous contrast. Carotid stenosis measurements (when applicable) are obtained utilizing NASCET criteria, using the distal internal carotid diameter as the denominator. COMPARISON:  Head CT 03/03/2017 Brain MRI 07/11/2016 FINDINGS: MRI HEAD FINDINGS Brain: The midline structures are normal. There are multiple bilateral punctate foci of abnormal diffusion restriction in subcortical locations of both frontal lobes, the right parietal lobe and the posterior left temporal lobe. There is no large territory infarct. There are multiple old bilateral  cerebellar and basal ganglia lacunar infarcts. The there is multifocal white matter hyperintensity suggesting chronic ischemic microangiopathy. No mass lesion. No chronic microhemorrhage or cerebral amyloid angiopathy. No hydrocephalus, age advanced atrophy or lobar predominant volume loss. No dural abnormality or extra-axial collection. Skull and upper cervical spine: The visualized skull base, calvarium, upper cervical spine and extracranial soft tissues are normal. Sinuses/Orbits: No fluid levels or advanced mucosal thickening. No mastoid effusion. Normal orbits. MRA HEAD FINDINGS Intracranial internal carotid arteries: There is multifocal moderate to severe narrowing of the internal carotid arteries, worst at the left clinoid segment. This is unchanged. 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 Anterior cerebral arteries: Congenitally absent left A1 segment. Normal anterior communicating artery and A2 segments. Middle cerebral arteries: Mild distal atherosclerotic irregularity. No occlusion or high-grade stenosis. Posterior communicating arteries: Absent bilaterally. Posterior cerebral arteries: Normal. Basilar artery: There is an anteriorly projecting outpouching from the tip of the basilar artery that measures 2 mm at its base and 3 mm base to apex. This is unchanged. Vertebral arteries: Left dominant. No flow related enhancement is seen in the right vertebral artery, unchanged. Superior cerebellar arteries: Normal. Anterior inferior cerebellar arteries: Not clearly seen, which is not uncommon. Posterior inferior cerebellar arteries: Poorly visualized right PICA. Normal left. MRA NECK FINDINGS Noncontrast MRA of the neck with moderate artifact obscuring details. Right carotid system: Normal course and caliber without stenosis or evidence of dissection. Left carotid system: There is chronic occlusion of the left common carotid artery. The patient is status post subclavian to parotid  bypass graft, which is not adequately visualized on this study. Vertebral arteries: The left vertebral artery is normal. There is little to no flow related enhancement seen within the right vertebral artery. IMPRESSION: 1. Multiple punctate foci of acute subcortical ischemia within both frontal lobes, the right parietal lobe and left temporal lobe. No large territory infarct. The distribution of lesions is suggestive of a cardiac or aortic embolic source. 2. Unchanged moderate to severe multifocal narrowing of the internal carotid arteries, worst at the left clinoid segment. 3. 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 4. Unchanged loss of flow related enhancement of the distal right vertebral artery. MRA of the neck shows no flow related enhancement along the cervical segments of the right vertebral artery. This may indicate occlusion, but the MRA is degraded by the lack of IV contrast. This could be further characterized with CTA of the neck or ultrasound. 5. Chronic occlusion of the left common carotid artery, status post subclavian to carotid bypass. The cervical portions of the internal carotid arteries are poorly assessed due to lack of IV contrast. This also could be further characterized with CTA of the neck or ultrasound. Electronically Signed   By: Ulyses Jarred M.D.   On: 03/04/2017 00:47   Mr Brain Wo Contrast  Result Date: 03/04/2017 CLINICAL DATA:  Difficulty speaking. Facial droop. Left arm weakness. EXAM:  MRI HEAD WITHOUT CONTRAST MRA HEAD WITHOUT CONTRAST MRA NECK WITHOUT CONTRAST TECHNIQUE: Multiplanar, multiecho pulse sequences of the brain and surrounding structures were obtained without intravenous contrast. Angiographic images of the Circle of Willis were obtained using MRA technique without intravenous contrast. Angiographic images of the neck were obtained using MRA technique without intravenous contrast.  Carotid stenosis measurements (when applicable) are obtained utilizing NASCET criteria, using the distal internal carotid diameter as the denominator. COMPARISON:  Head CT 03/03/2017 Brain MRI 07/11/2016 FINDINGS: MRI HEAD FINDINGS Brain: The midline structures are normal. There are multiple bilateral punctate foci of abnormal diffusion restriction in subcortical locations of both frontal lobes, the right parietal lobe and the posterior left temporal lobe. There is no large territory infarct. There are multiple old bilateral cerebellar and basal ganglia lacunar infarcts. The there is multifocal white matter hyperintensity suggesting chronic ischemic microangiopathy. No mass lesion. No chronic microhemorrhage or cerebral amyloid angiopathy. No hydrocephalus, age advanced atrophy or lobar predominant volume loss. No dural abnormality or extra-axial collection. Skull and upper cervical spine: The visualized skull base, calvarium, upper cervical spine and extracranial soft tissues are normal. Sinuses/Orbits: No fluid levels or advanced mucosal thickening. No mastoid effusion. Normal orbits. MRA HEAD FINDINGS Intracranial internal carotid arteries: There is multifocal moderate to severe narrowing of the internal carotid arteries, worst at the left clinoid segment. This is unchanged. 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 Anterior cerebral arteries: Congenitally absent left A1 segment. Normal anterior communicating artery and A2 segments. Middle cerebral arteries: Mild distal atherosclerotic irregularity. No occlusion or high-grade stenosis. Posterior communicating arteries: Absent bilaterally. Posterior cerebral arteries: Normal. Basilar artery: There is an anteriorly projecting outpouching from the tip of the basilar artery that measures 2 mm at its base and 3 mm base to apex. This is unchanged. Vertebral arteries: Left dominant. No flow related enhancement is seen in the right vertebral  artery, unchanged. Superior cerebellar arteries: Normal. Anterior inferior cerebellar arteries: Not clearly seen, which is not uncommon. Posterior inferior cerebellar arteries: Poorly visualized right PICA. Normal left. MRA NECK FINDINGS Noncontrast MRA of the neck with moderate artifact obscuring details. Right carotid system: Normal course and caliber without stenosis or evidence of dissection. Left carotid system: There is chronic occlusion of the left common carotid artery. The patient is status post subclavian to parotid bypass graft, which is not adequately visualized on this study. Vertebral arteries: The left vertebral artery is normal. There is little to no flow related enhancement seen within the right vertebral artery. IMPRESSION: 1. Multiple punctate foci of acute subcortical ischemia within both frontal lobes, the right parietal lobe and left temporal lobe. No large territory infarct. The distribution of lesions is suggestive of a cardiac or aortic embolic source. 2. Unchanged moderate to severe multifocal narrowing of the internal carotid arteries, worst at the left clinoid segment. 3. 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 4. Unchanged loss of flow related enhancement of the distal right vertebral artery. MRA of the neck shows no flow related enhancement along the cervical segments of the right vertebral artery. This may indicate occlusion, but the MRA is degraded by the lack of IV contrast. This could be further characterized with CTA of the neck or ultrasound. 5. Chronic occlusion of the left common carotid artery, status post subclavian to carotid bypass. The cervical portions of the internal carotid arteries are poorly assessed due to lack of IV contrast. This also  could be further characterized with CTA of the neck or ultrasound. Electronically Signed   By: Ulyses Jarred M.D.   On: 03/04/2017 00:47   Ct Head Code  Stroke Wo Contrast  Result Date: 03/03/2017 CLINICAL DATA:  Code stroke. 71 y/o F; left-sided facial droop and arm weakness. EXAM: CT HEAD WITHOUT CONTRAST TECHNIQUE: Contiguous axial images were obtained from the base of the skull through the vertex without intravenous contrast. COMPARISON:  07/11/2016 MRI of the head. FINDINGS: Brain: No evidence of acute infarction, hemorrhage, hydrocephalus, extra-axial collection or mass lesion/mass effect. Stable small chronic infarction within the right cerebellar hemisphere. Stable chronic lacunar infarctions within the right greater than left lentiform nuclei. Stable chronic microvascular ischemic changes of the brain and parenchymal volume loss. Vascular: Extensive calcific atherosclerosis of carotid siphons. No hyperdense vessel. Skull: Normal. Negative for fracture or focal lesion. Sinuses/Orbits: Small right maxillary mucous retention cyst. Otherwise negative. Other: None. ASPECTS [facility] Stroke Program Early CT Score) - Ganglionic level infarction (caudate, lentiform nuclei, internal capsule, insula, M1-M3 cortex): 7 - Supraganglionic infarction (M4-M6 cortex): 3 Total score (0-10 with 10 being normal): 10 IMPRESSION: 1. No acute intracranial abnormality identified. 2. Stable small chronic infarction in right cerebellar hemisphere and chronic lacunar infarcts in basal ganglia. 3. Stable chronic microvascular ischemic changes and parenchymal volume loss of the brain. 4. ASPECTS is 10. These results were text paged at the time of interpretation on 03/03/2017 at 9:47 pm to Dr. Lorraine Lax. Electronically Signed   By: Kristine Garbe M.D.   On: 03/03/2017 21:47    Labs:  CBC: Recent Labs    02/19/17 1228 03/03/17 2111 03/03/17 2124 03/04/17 0454 03/05/17 0419  WBC 7.4 10.0  --  9.6 10.6*  HGB 13.5 13.9 14.3 13.3 13.0  HCT 40.6 42.1 42.0 40.7 39.4  PLT 274 265  --  218 212    COAGS: Recent Labs    03/03/17 2111  INR 0.92  APTT 31     BMP: Recent Labs    02/19/17 1228 03/03/17 2111 03/03/17 2124 03/04/17 0454 03/05/17 0419  NA 142 137 140 140 139  K 4.9 4.0 3.9 3.7 3.4*  CL 105 108 107 112* 109  CO2 23 22  --  17* 23  GLUCOSE 119* 126* 125* 98 91  BUN 23 20 22* 22* 18  CALCIUM 9.1 9.2  --  8.7* 8.8*  CREATININE 1.48* 1.35* 1.30* 1.25* 1.20*  GFRNONAA 35* 38*  --  42* 44*  GFRAA 41* 45*  --  49* 51*    LIVER FUNCTION TESTS: Recent Labs    05/05/16 1516 02/19/17 1228 03/03/17 2111  BILITOT 0.3 0.3 0.7  AST 14 17 21   ALT 12 8 14   ALKPHOS 84 73 71  PROT 6.4 6.0 6.9  ALBUMIN 4.1 4.1 4.0    TUMOR MARKERS: No results for input(s): AFPTM, CEA, CA199, CHROMGRNA in the last 8760 hours.  Assessment and Plan:  Hx intracranial aneurysm - followed by Dr Estanislado Pandy CVA; tPA Scheduled for cerebral arteriogram in am Risks and benefits of cerebral arteriogram were discussed with the patient including, but not limited to bleeding, infection, vascular injury, contrast induced renal failure, stroke or even death. This interventional procedure involves the use of X-rays and because of the nature of the planned procedure, it is possible that we will have prolonged use of X-ray fluoroscopy. Potential radiation risks to you include (but are not limited to) the following: - A slightly elevated risk for cancer  several years later  in life. This risk is typically less than 0.5% percent. This risk is low in comparison to the normal incidence of human cancer, which is 33% for women and 50% for men according to the [place]. - Radiation induced injury can include skin redness, resembling a rash, tissue breakdown / ulcers and hair loss (which can be temporary or permanent).  The likelihood of either of these occurring depends on the difficulty of the procedure and whether you are sensitive to radiation due to previous procedures, disease, or genetic conditions.  IF your procedure requires a prolonged use of  radiation, you will be notified and given written instructions for further action.  It is your responsibility to monitor the irradiated area for the 2 weeks following the procedure and to notify your physician if you are concerned that you have suffered a radiation induced injury.    All of the patient's questions were answered, patient is agreeable to proceed. Consent signed and in chart.  Thank you for this interesting consult.  I greatly enjoyed meeting Colene S Difatta and look forward to participating in their care.  A copy of this report was sent to the requesting provider on this date.  Electronically Signed: Lavonia Drafts, PA-C 03/05/2017, 11:21 AM   I spent a total of 40 Minutes    in face to face in clinical consultation, greater than 50% of which was counseling/coordinating care for cerebral arteriogram

## 2017-03-06 ENCOUNTER — Encounter (HOSPITAL_COMMUNITY)
Admission: EM | Disposition: A | Discharge status: Home / Self Care | Payer: Self-pay | Source: Home / Self Care | Attending: Neurology

## 2017-03-06 ENCOUNTER — Inpatient Hospital Stay (HOSPITAL_COMMUNITY): Payer: Medicare Other

## 2017-03-06 DIAGNOSIS — I6521 Occlusion and stenosis of right carotid artery: Secondary | ICD-10-CM

## 2017-03-06 DIAGNOSIS — I6523 Occlusion and stenosis of bilateral carotid arteries: Secondary | ICD-10-CM

## 2017-03-06 DIAGNOSIS — I6522 Occlusion and stenosis of left carotid artery: Secondary | ICD-10-CM

## 2017-03-06 DIAGNOSIS — I639 Cerebral infarction, unspecified: Secondary | ICD-10-CM

## 2017-03-06 HISTORY — PX: IR ANGIO VERTEBRAL SEL VERTEBRAL BILAT MOD SED: IMG5369

## 2017-03-06 HISTORY — PX: IR ANGIO EXTERNAL CAROTID SEL EXT CAROTID UNI R MOD SED: IMG5371

## 2017-03-06 LAB — CBC
HCT: 40 % (ref 36.0–46.0)
HEMOGLOBIN: 13.2 g/dL (ref 12.0–15.0)
MCH: 28.3 pg (ref 26.0–34.0)
MCHC: 33 g/dL (ref 30.0–36.0)
MCV: 85.7 fL (ref 78.0–100.0)
Platelets: 223 10*3/uL (ref 150–400)
RBC: 4.67 MIL/uL (ref 3.87–5.11)
RDW: 13.6 % (ref 11.5–15.5)
WBC: 9.7 10*3/uL (ref 4.0–10.5)

## 2017-03-06 LAB — BASIC METABOLIC PANEL
Anion gap: 8 (ref 5–15)
BUN: 19 mg/dL (ref 6–20)
CALCIUM: 8.9 mg/dL (ref 8.9–10.3)
CO2: 20 mmol/L — ABNORMAL LOW (ref 22–32)
CREATININE: 1.35 mg/dL — AB (ref 0.44–1.00)
Chloride: 109 mmol/L (ref 101–111)
GFR, EST AFRICAN AMERICAN: 45 mL/min — AB (ref >60)
GFR, EST NON AFRICAN AMERICAN: 38 mL/min — AB (ref >60)
Glucose, Bld: 116 mg/dL — ABNORMAL HIGH (ref 65–99)
Potassium: 4.6 mmol/L (ref 3.5–5.1)
Sodium: 137 mmol/L (ref 135–145)

## 2017-03-06 LAB — GLUCOSE, CAPILLARY
GLUCOSE-CAPILLARY: 102 mg/dL — AB (ref 65–99)
GLUCOSE-CAPILLARY: 112 mg/dL — AB (ref 65–99)
Glucose-Capillary: 169 mg/dL — ABNORMAL HIGH (ref 65–99)

## 2017-03-06 SURGERY — ECHOCARDIOGRAM, TRANSESOPHAGEAL
Anesthesia: Monitor Anesthesia Care

## 2017-03-06 MED ORDER — FENTANYL CITRATE (PF) 100 MCG/2ML IJ SOLN
INTRAMUSCULAR | Status: AC | PRN
  Administered 2017-03-06: 25 ug via INTRAVENOUS

## 2017-03-06 MED ORDER — LIDOCAINE HCL 1 % IJ SOLN
INTRAMUSCULAR | Status: AC
  Filled 2017-03-06: qty 20

## 2017-03-06 MED ORDER — SODIUM CHLORIDE 0.9 % IV SOLN
INTRAVENOUS | Status: DC
  Administered 2017-03-06: 75 mL via INTRAVENOUS
  Administered 2017-03-07: 02:00:00 via INTRAVENOUS

## 2017-03-06 MED ORDER — MIDAZOLAM HCL 2 MG/2ML IJ SOLN
INTRAMUSCULAR | Status: AC | PRN
  Administered 2017-03-06: 1 mg via INTRAVENOUS

## 2017-03-06 MED ORDER — FENTANYL CITRATE (PF) 100 MCG/2ML IJ SOLN
INTRAMUSCULAR | Status: AC
  Filled 2017-03-06: qty 2

## 2017-03-06 MED ORDER — IOPAMIDOL (ISOVUE-300) INJECTION 61%
INTRAVENOUS | Status: AC
  Administered 2017-03-06: 90 mL
  Filled 2017-03-06: qty 150

## 2017-03-06 MED ORDER — HYDRALAZINE HCL 20 MG/ML IJ SOLN
INTRAMUSCULAR | Status: AC
  Filled 2017-03-06: qty 1

## 2017-03-06 MED ORDER — LIDOCAINE HCL (PF) 1 % IJ SOLN
INTRAMUSCULAR | Status: AC | PRN
  Administered 2017-03-06: 15 mL

## 2017-03-06 MED ORDER — HEPARIN SODIUM (PORCINE) 1000 UNIT/ML IJ SOLN
INTRAMUSCULAR | Status: AC | PRN
  Administered 2017-03-06: 1000 [IU] via INTRAVENOUS
  Administered 2017-03-06: 500 [IU] via INTRAVENOUS

## 2017-03-06 MED ORDER — HEPARIN SODIUM (PORCINE) 1000 UNIT/ML IJ SOLN
INTRAMUSCULAR | Status: AC
  Filled 2017-03-06: qty 1

## 2017-03-06 MED ORDER — IOPAMIDOL (ISOVUE-300) INJECTION 61%
INTRAVENOUS | Status: AC
  Filled 2017-03-06: qty 50

## 2017-03-06 MED ORDER — MIDAZOLAM HCL 2 MG/2ML IJ SOLN
INTRAMUSCULAR | Status: AC
  Filled 2017-03-06: qty 2

## 2017-03-06 NOTE — Progress Notes (Signed)
STROKE TEAM PROGRESS NOTE   SUBJECTIVE (INTERVAL HISTORY) Paula Watson daughter and son in law are at the bedside. Pt has cerebral angio today showing right ICA 90% stenosis and left subclavian to CCA bypass occluded. Left ICA supplied by left VA via left ECA. VVS will be consulted for possible right CEA.    OBJECTIVE Temp:  [97.7 F (36.5 C)-99.1 F (37.3 C)] 97.9 F (36.6 C) (11/23 0500) Pulse Rate:  [56-76] 60 (11/23 1021) Cardiac Rhythm: Normal sinus rhythm (11/23 0951) Resp:  [11-25] 17 (11/23 1021) BP: (122-183)/(42-76) 144/62 (11/23 1021) SpO2:  [92 %-99 %] 96 % (11/23 1021)  Recent Labs  Lab 03/05/17 0906 03/05/17 1203 03/05/17 1721 03/05/17 2208 03/06/17 0626  GLUCAP 107* 128* 177* 217* 102*   Recent Labs  Lab 03/03/17 2111 03/03/17 2124 03/04/17 0454 03/05/17 0419 03/06/17 0407  NA 137 140 140 139 137  K 4.0 3.9 3.7 3.4* 4.6  CL 108 107 112* 109 109  CO2 22  --  17* 23 20*  GLUCOSE 126* 125* 98 91 116*  BUN 20 22* 22* 18 19  CREATININE 1.35* 1.30* 1.25* 1.20* 1.35*  CALCIUM 9.2  --  8.7* 8.8* 8.9   Recent Labs  Lab 03/03/17 2111  AST 21  ALT 14  ALKPHOS 71  BILITOT 0.7  PROT 6.9  ALBUMIN 4.0   Recent Labs  Lab 03/03/17 2111 03/03/17 2124 03/04/17 0454 03/05/17 0419 03/06/17 0407  WBC 10.0  --  9.6 10.6* 9.7  NEUTROABS 5.4  --   --   --   --   HGB 13.9 14.3 13.3 13.0 13.2  HCT 42.1 42.0 40.7 39.4 40.0  MCV 85.7  --  86.0 85.8 85.7  PLT 265  --  218 212 223   No results for input(s): CKTOTAL, CKMB, CKMBINDEX, TROPONINI in the last 168 hours. Recent Labs    03/03/17 2111  LABPROT 12.3  INR 0.92   No results for input(s): COLORURINE, LABSPEC, PHURINE, GLUCOSEU, HGBUR, BILIRUBINUR, KETONESUR, PROTEINUR, UROBILINOGEN, NITRITE, LEUKOCYTESUR in the last 72 hours.  Invalid input(s): APPERANCEUR     Component Value Date/Time   CHOL 152 03/04/2017 0454   CHOL 161 02/19/2017 1228   TRIG 139 03/04/2017 0454   HDL 41 03/04/2017 0454   HDL 48  02/19/2017 1228   CHOLHDL 3.7 03/04/2017 0454   VLDL 28 03/04/2017 0454   LDLCALC 83 03/04/2017 0454   LDLCALC 80 02/19/2017 1228   Lab Results  Component Value Date   HGBA1C 6.4 (H) 03/04/2017   No results found for: LABOPIA, COCAINSCRNUR, LABBENZ, AMPHETMU, THCU, LABBARB  No results for input(s): ETH in the last 168 hours.   IMAGING  I have personally reviewed the radiological images below and agree with the radiology interpretations.  Mri brain and Mra Head and neck Wo Contrast 03/04/2017 IMPRESSION:  1. Multiple punctate foci of acute subcortical ischemia within both frontal lobes, the right parietal lobe and left temporal lobe. No large territory infarct. The distribution of lesions is suggestive of a cardiac or aortic embolic source.  2. Unchanged moderate to severe multifocal narrowing of the internal carotid arteries, worst at the left clinoid segment.  3. 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  4. Unchanged loss of flow related enhancement of the distal right vertebral artery. MRA of the neck shows no flow related enhancement along the cervical segments of the right vertebral artery. This may indicate occlusion,  but the MRA is degraded by the lack of IV contrast. This could be further characterized with CTA of the neck or ultrasound.  5. Chronic occlusion of the left common carotid artery, status post subclavian to carotid bypass. The cervical portions of the internal carotid arteries are poorly assessed due to lack of IV contrast. This also could be further characterized with CTA of the neck or ultrasound.   Ct Head Code Stroke Wo Contrast 03/03/2017 IMPRESSION:  1. No acute intracranial abnormality identified.  2. Stable small chronic infarction in right cerebellar hemisphere and chronic lacunar infarcts in basal ganglia.  3. Stable chronic microvascular ischemic changes and parenchymal volume  loss of the brain.  4. ASPECTS is 10.   Ct Angio Head and neck W Or Wo Contrast 03/04/2017 IMPRESSION:  1. No intracranial large vessel occlusion.  No acute hemorrhage.  2. Proximal right internal carotid artery stenosis measuring 90% secondary to the presence of predominantly calcified plaque.  3. Chronic occlusion of the left common carotid artery at its origin. There is no flow seen within the left subclavian to ICA graft. The left carotid bifurcation and internal and external carotid arteries do show opacification. There is approximately 75% stenosis of the proximal left internal carotid artery. This measurement is likely underestimating the degree of stenosis, because of the small caliber of the distal ICA.  4. Bilateral moderate to severe atherosclerotic narrowing of the internal carotid arteries at the skullbase.  5. 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   Cerebral Angiogram - Dr Estanislado Pandy 03/06/2017 S/P 4 vessel cerebral arteriogram. RT CFA approach. Findings. 1.Occluded previous Lt subclavian to Lt CCA graft. 2.90 % plus RT ICA prox stenosis. 3.Stable 95mm basilar apex and RT PCOM aneurysyms. 4.50 to 60 % stenosis of Lt ICA cavernous seg stenosis. 5.Approx 50 % stenosis of RT MCA M1 seg.  2D Echocardiogram - Left ventricle: The cavity size was normal. Wall thickness was   increased in a pattern of moderate LVH. Systolic function was   normal. The estimated ejection fraction was in the range of 60%   to 65%. Wall motion was normal; there were no regional wall   motion abnormalities. Left ventricular diastolic function   parameters were normal. - Atrial septum: No defect or patent foramen ovale was identified. Impressions: - No cardiac source of emboli was indentified.   PHYSICAL EXAM  Temp:  [97.7 F (36.5 C)-99.1 F (37.3 C)] 97.9 F (36.6 C) (11/23 0500) Pulse Rate:  [56-76] 60 (11/23 1021) Resp:  [11-25] 17 (11/23 1021) BP:  (122-183)/(42-76) 144/62 (11/23 1021) SpO2:  [92 %-99 %] 96 % (11/23 1021)  General - Well nourished, well developed, in no apparent distress.  Ophthalmologic - fundi not visualized due to noncooperation.  Cardiovascular - Regular rate and rhythm with no murmur.  Mental Status -  Level of arousal and orientation to time, place, and person were intact. Language including expression, naming, repetition, comprehension was assessed and found intact. Fund of Knowledge was assessed and was intact.  Cranial Nerves II - XII - II - Visual field intact OU. III, IV, VI - Extraocular movements intact. V - Facial sensation intact bilaterally. VII - Facial movement intact bilaterally. VIII - Hearing & vestibular intact bilaterally. X - Palate elevates symmetrically. XI - Chin turning & shoulder shrug intact bilaterally. XII - Tongue protrusion intact.  Motor Strength - The patient's strength was normal in all extremities except right foot 3/5 PF and DF due to  hx of polio and pronator drift was absent.  Bulk was normal and fasciculations were absent.   Motor Tone - Muscle tone was assessed at the neck and appendages and was normal.  Reflexes - The patient's reflexes were symmetrical in all extremities and she had no pathological reflexes.  Sensory - Light touch, temperature/pinprick were assessed and were symmetrical.    Coordination - The patient had normal movements in the hands with no ataxia or dysmetria.  Tremor was absent.  Gait and Station - deferred.   ASSESSMENT/PLAN Paula Watson is a 71 y.o. female with history of CKD, CAD, DM, HLD, HTN, polil, TIA, BA tip aneurysm admitted for recurrent left arm and facial weakness, slurry speech. TPA given 03/03/2017.  Stroke:  bilateral MCA/ACA and MCA/PCA four punctate infarcts - could be due to high grade stenosis of right ICA and occluded left subclavian to CCA bypass with left ICA 50-60% stenosis. However, cardioemoblic can not be  completely ruled out due to episodes of heart palpitation at home.   Resultant back to baseline  MRI 4 punctate foci of acute subcortical ischemia within both frontal lobes, the right parietal lobe and left temporal lobe  MRA  67mm BA tip aneurysm, stable  CTA head and neck high grade stenosis of right ICA and occluded left subclavian to CCA bypass with left ICA > 75% stenosis. Stable BA aneurysm.  Cerebral Angiogram - Occluded previous Lt subclavian to Lt CCA graft. 90 % plus RT ICA prox stenosis. 50 to 60 % stenosis of Lt ICA cavernous seg stenosis  2D Echo EF 60-65%  VVS consulted for right CEA  LDL 83  HgbA1c 6.4  SCDs for VTE prophylaxis  Diet NPO time specified Except for: Sips with Meds   aspirin 81 mg daily prior to admission, now on ASA and plavix dual antiplatelet.   Patient counseled to be compliant with Paula Watson antithrombotic medications  Ongoing aggressive stroke risk factor management  Therapy recommendations:  HH PT  Disposition:  Pending  Carotid stenosis bilateral  CUS 10/2005 right ICA patent  CUS 08/2014 right ICA 50-69% stenosis but could be underestimating due to plaque  Right ICA 90% stenosis on CTA this time.  Left subclavian to CCA bypass in 2000  10/2005 underwent left subclavian artery angioplasty due to stenosis  08/2014 CUS showed bypass was patent with low resistance antegrade flow   Current CTA showed bypass closed.  Cerebral angio Occluded previous Lt subclavian to Lt CCA graft. 90 % plus RT ICA prox stenosis. 50 to 60 % stenosis of Lt ICA cavernous seg stenosis  Heart palpitation  More frequent for the last one month, especially at night  Followed with PCP and put on coreg and getting improved  Has cardiology referral in [place] on 03/13/17  Recommend outpt follow up with cardiology to consider loop recorder for arrhythmia evaluation given current stroke. Pt understood that she needs to request medical record sent to Paula Watson cardiologist to  facilitate the process. I can not find Paula Watson cardiologist appointment via Epic system at this time.  Hx of TIA  02/2011 transient right arm weakness - MRI neg, CUS showed right ICA 40-59% stenosis, EF 60-65% - ASA changed to plavix  Hx of BA tip aneurysm  MRA showed stable aneurysm  DSA also showed stable 47mm BA aneurysm  Following with Dr. Estanislado Pandy  Diabetes  HgbA1c 6.4 goal < 7.0  Controlled  Currently on SSI  CBG monitoring  SSI  close PCP follow up  Hypertension Stable, on the high side Permissive hypertension (OK if <180/105) for 24-48 hours post stroke and then gradually normalized within 5-7 days. Coreg resumed, but other home BP meds on hold  Long term BP goal 130-160 now due to b/l carotid disease  Avoid low BP at home  Hyperlipidemia  Home meds:  pravastatin 10  LDL 83, goal < 70  Now on pravastatin 20  Continue statin at discharge  Tobacco abuse  Current smoker  Smoking cessation counseling provided  Pt is willing to quit  Other Stroke Risk Factors  Advanced age  Coronary artery disease  Other Active Problems  CKD stage II, Cre 1.30->1.25->1.20->1.35 - recheck in AM  Hx of polio - right foot DF/PF impairment  Hospital day # 3  Rosalin Hawking, MD PhD Stroke Neurology 03/06/2017 1:58 PM   To contact Stroke Continuity provider, please refer to [email or web address] After hours, contact General Neurology

## 2017-03-06 NOTE — Sedation Documentation (Signed)
Patient is resting comfortably. 

## 2017-03-06 NOTE — Sedation Documentation (Signed)
Awake, instructions given to pt about post-angio bedrest and instruction

## 2017-03-06 NOTE — Sedation Documentation (Signed)
IR holding pressure

## 2017-03-06 NOTE — Consult Note (Addendum)
Patient name: Paula Watson MRN: [id number] DOB: 1946/01/20 Sex: female   REASON FOR CONSULT:    Bilateral carotid disease.  The consult is requested by Dr. Erlinda Hong.    HPI:   Paula Watson is a pleasant 71 y.o. female, who was admitted 2 days ago after she developed the sudden onset of right facial droop and expressive aphasia.  The symptoms lasted approximately 2 minutes.  She states that earlier that day she had developed the sudden onset of left arm clumsiness which lasted about a minute.  She denies any previous history of stroke, TIAs, expressive or receptive aphasia, or amaurosis fugax.  She has had some dizziness over the last several days.  Of note, she underwent a left subclavian to carotid bypass and left carotid endarterectomy by Dr. Drucie Opitz in 2000.  She has not been followed in our office.  She did have a duplex at an outlying institution 2 years ago which showed that her subclavian to carotid bypass graft was patent.  She had a 50-69% right carotid stenosis.  Her workup here has included a CT angiogram of the neck and also cerebral arteriography which was done earlier today.  This showed a 90% right carotid stenosis and vascular surgery is consulted.  Patient is right-handed.  The patient was on 81 mg of aspirin daily prior to this event but had run out and had not taken it for 2 days.  She is on a statin.  She does continue to smoke.  Past Medical History:  Diagnosis Date  . Anemia   . 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 Blood transfusion   . Bronchitis    "have had a couple of times in my lifetime"  . Cancer ([place]) 1985   cervical  . Cataract   . CKD (chronic kidney disease) stage 1, GFR 90 ml/min or greater was told by her PCP   Intolerant to ACE according to her PCP  . Coronary artery disease   . Diabetes mellitus   . GERD (gastroesophageal reflux disease)   . Hemorrhoids   . High cholesterol   . Hyperlipidemia   . Hypertension   . Polio 1953  . Poliomyelitis  since 1948   has drop foot on R  . Stroke [facility]) 2007 affected R side  . Vertigo now improved    Family History  Problem Relation Age of Onset  . Coronary artery disease Mother   . Heart disease Mother   . Diabetes type II Father   . Cancer Father        brain stem  . Brain cancer Father   . Diabetes Father   . Cancer Sister        breast with recurrance  . Diabetes Sister   . Stroke Sister   . Coronary artery disease Brother   . COPD Maternal Grandmother        breast  . COPD Paternal Grandmother        breast  . Heart disease Brother   . Heart attack Brother   . COPD Other     SOCIAL HISTORY: She smokes about a pack of cigarettes per week.  She has been smoking since she was 71 years old. Social History   Socioeconomic History  . Marital status: Divorced    Spouse name: Not on file  . Number of children: Not on file  . Years of education: Not on file  . Highest education level: Not on file  Social Needs  . Financial resource strain: Not on file  . Food insecurity - worry: Not on file  . Food insecurity - inability: Not on file  . Transportation needs - medical: Not on file  . Transportation needs - non-medical: Not on file  Occupational History  . Not on file  Tobacco Use  . Smoking status: Current Every Day Smoker    Packs/day: 0.25    Years: 50.00    Pack years: 12.50    Types: Cigarettes  . Smokeless tobacco: Never Used  . Tobacco comment: "working hard to quit"  Substance and Sexual Activity  . Alcohol use: No  . Drug use: No  . Sexual activity: No    Birth control/protection: Post-menopausal  Other Topics Concern  . Not on file  Social History Narrative  . Not on file    Allergies  Allergen Reactions  . Penicillins Hives and Swelling    Has patient had a PCN reaction causing immediate rash, facial/tongue/throat swelling, SOB or lightheadedness with hypotension: Yes Has patient had a PCN reaction causing severe rash involving mucus membranes  or skin necrosis: Unknown Has patient had a PCN reaction that required hospitalization: No Has patient had a PCN reaction occurring within the last 10 years: No If all of the above answers are "NO", then may proceed with Cephalosporin use.     Current Facility-Administered Medications  Medication Dose Route Frequency Provider Last Rate Last Dose  .  stroke: mapping our early stages of recovery book   Does not apply Once Aroor, Sushanth R, MD      . 0.9 %  sodium chloride infusion   Intravenous Continuous Rosalin Hawking, MD 50 mL/hr at 03/05/17 2351    . 0.9 %  sodium chloride infusion   Intravenous Continuous Luanne Bras, MD 75 mL/hr at 03/06/17 1307 75 mL at 03/06/17 1307  . acetaminophen (TYLENOL) tablet 650 mg  650 mg Oral Q6H PRN Aroor, Lanice Schwab, MD   650 mg at 03/06/17 0837  . aspirin EC tablet 325 mg  325 mg Oral Daily Rosalin Hawking, MD   325 mg at 03/06/17 6073  . bisacodyl (DULCOLAX) EC tablet 5 mg  5 mg Oral Daily PRN Aroor, Lanice Schwab, MD   5 mg at 03/05/17 2138  . carvedilol (COREG) tablet 6.25 mg  6.25 mg Oral BID WC Rosalin Hawking, MD   6.25 mg at 03/06/17 7106  . clopidogrel (PLAVIX) tablet 75 mg  75 mg Oral Daily Rosalin Hawking, MD   75 mg at 03/06/17 0837  . fentaNYL (SUBLIMAZE) 100 MCG/2ML injection           . heparin 1000 UNIT/ML injection           . insulin aspart (novoLOG) injection 0-9 Units  0-9 Units Subcutaneous TID AC & HS Rosalin Hawking, MD   3 Units at 03/05/17 2217  . labetalol (NORMODYNE,TRANDATE) injection 10 mg  10 mg Intravenous Q2H PRN Aroor, Lanice Schwab, MD   10 mg at 03/05/17 1011  . lidocaine (XYLOCAINE) 1 % (with pres) injection           . midazolam (VERSED) 2 MG/2ML injection           . pravastatin (PRAVACHOL) tablet 20 mg  20 mg Oral Daily Rosalin Hawking, MD   20 mg at 03/05/17 1729    REVIEW OF SYSTEMS:  [X]  denotes positive finding, [ ]  denotes negative finding Cardiac  Comments:  Chest pain or chest pressure: X  she has had some chest pressure  recently.  Shortness of breath upon exertion:    Short of breath when lying flat:    Irregular heart rhythm: X  she has had a fluttering sensation in her chest recently.      Vascular    Pain in calf, thigh, or hip brought on by ambulation: X  bilateral calf claudication  Pain in feet at night that wakes you up from your sleep:     Blood clot in your veins:    Leg swelling:         Pulmonary    Oxygen at home:    Productive cough:     Wheezing:         Neurologic    Sudden weakness in arms or legs:     Sudden numbness in arms or legs:     Sudden onset of difficulty speaking or slurred speech:    Temporary loss of vision in one eye:     Problems with dizziness:         Gastrointestinal    Blood in stool:     Vomited blood:         Genitourinary    Burning when urinating:     Blood in urine:        Psychiatric    Major depression:         Hematologic    Bleeding problems:    Problems with blood clotting too easily:        Skin    Rashes or ulcers:        Constitutional    Fever or chills:     PHYSICAL EXAM:   Vitals:   03/06/17 1016 03/06/17 1020 03/06/17 1021 03/06/17 1424  BP: (!) 142/69 135/70 (!) 144/62 (!) 170/58  Pulse: 64  60 63  Resp: (!) 22  17 18   Temp:    98.9 F (37.2 C)  TempSrc:    Oral  SpO2: 97%  96% 98%  Weight:      Height:        GENERAL: The patient is a well-nourished female, in no acute distress. The vital signs are documented above. CARDIAC: There is a regular rate and rhythm.  VASCULAR: She has bilateral carotid bruits. She has palpable radial pulses bilaterally. She has palpable femoral pulses.  She has a palpable right dorsalis pedis pulse and left posterior tibial pulse. PULMONARY: There is good air exchange bilaterally without wheezing or rales. ABDOMEN: Soft and non-tender with normal pitched bowel sounds.  MUSCULOSKELETAL: The left leg is bigger than the right because of her history of polio. NEUROLOGIC: No focal weakness  or paresthesias are detected. SKIN: There are no ulcers or rashes noted. PSYCHIATRIC: The patient has a normal affect.  DATA:    CT ANGIOGRAM NECK: I reviewed the CT angiogram of the neck which shows a bulky calcific plaque at the carotid bifurcation on the right producing a significant stenosis.  Because of the calcific disease but it is difficult to fully assess the severity of the stenosis.  However the plaque appears quite high and I do not think this is surgically accessible.  The left subclavian carotid bypass is occluded.  There is a stenosis in the proximal internal carotid artery on the left.  The internal carotid artery flow is being maintained through retrograde flow from the external carotid artery.  CEREBRAL ARTERIOGRAM: I have reviewed the cerebral arteriogram that was done earlier today.  She has a bulky  calcific plaque at the right carotid bifurcation.  She has a 90% right internal carotid artery stenosis.  The subclavian carotid bypass on the left is occluded.  In addition, she does have calcific disease involving her aortic arch and innominate artery.  MRI: MRI on 03/03/2017 shows multiple punctate foci of acute subcortical ischemia within both frontal lobes, the right parietal lobe, and the left temporal lobe.  The distribution of these lesions is suggestive of a cardiac or aortic embolic source.  MEDICAL ISSUES:   SYMPTOMATIC 90% RIGHT CAROTID STENOSIS: The patient has asymptomatic 90% right internal carotid artery stenosis.  Based on her CT angiogram of the neck, I do not think that the stenosis is surgically accessible.  I would favor carotid stenting.  Dr. Estanislado Pandy reportedly did think that this could potentially be addressed with a stent.  The patient is now on aspirin and Plavix.  We have discussed the importance of tobacco cessation.  I would agree that there is nothing to do on the left side given the common carotid artery occlusion.  I will try to discuss this case with Dr.  Estanislado Pandy on Monday unless I see him here at the hospital over the weekend.  Deitra Mayo [facility] [phone]

## 2017-03-06 NOTE — Sedation Documentation (Addendum)
IR team holding pressure to L groin

## 2017-03-06 NOTE — Progress Notes (Signed)
Pre angiogram BP, L arm 182/68, R arm 179/54

## 2017-03-06 NOTE — Sedation Documentation (Signed)
IR still holding pressure L groin

## 2017-03-06 NOTE — Progress Notes (Addendum)
Occupational Therapy Evaluation Patient Details Name: Paula Watson MRN: [id number] DOB: [birth year] Today's Date: 03/06/2017    History of Present Illness 71 y.o. female admitted on 03/03/17 with slurred speech and left sided weakness.  Pt given tPA and MRI revealed bil MCA/ACA and MCA/PCA four punctate infarcts embolic pattern concerning for undiagnosed A-fib.  She is due to have a TEE on 11/23 and loop recorder placement.  Pt with significant PMH of vertigo (takes antrivert daily), stroke in 2007, polio affecting R side (with right foot reconstruction AFO and special custom shoe), HTN, DM, CAD, CKD, cervical CA, aneurysm in her brain (stable), anemia, and left carotid endarterectomy.  Cerebral angiogram 11/23.    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 Pt making excellent progress although she complains of "giving out". HR 84; SpO2 98 RA. At this time, recommend follow up with HHOT to facilitate return to PLOF and occupations. Educated on signs/symptoms of CVA using Be[employer].  Will follow acutely to facilitate safe DC home.     Follow Up Recommendations  Home health OT;Supervision - Intermittent    Equipment Recommendations  3 in 1 bedside commode    Recommendations for Other Services       Precautions / Restrictions Precautions Precautions: Fall Restrictions Weight Bearing Restrictions: No      Mobility Bed Mobility Overal bed mobility: Modified Independent                Transfers Overall transfer level: Needs assistance Equipment used: None Transfers: Sit to/from Stand Sit to Stand: Supervision              Balance Overall balance assessment: Needs assistance Sitting-balance support: Feet supported;No upper extremity supported Sitting balance-Leahy Scale: Good     Standing balance support: No upper extremity supported Standing balance-Leahy Scale: Fair                              ADL either performed or assessed with clinical judgement   ADL Overall ADL's : Needs assistance/impaired     Grooming: Set up;Standing   Upper Body Bathing: Set up;Standing   Lower Body Bathing: Set up;Supervison/ safety;Sit to/from stand   Upper Body Dressing : Set up;Sitting   Lower Body Dressing: Set up;Sit to/from stand;Supervision/safety   Toilet Transfer: Ambulation;Minimal Print production planner Details (indicate cue type and reason): Pt feeling unsteady and using HHA to ambulate Toileting- Clothing Manipulation and Hygiene: Supervision/safety;Sit to/from stand       Functional mobility during ADLs: Minimal assistance(HHA; reaching to "furniture walk")       Vision Baseline Vision/History: Wears glasses Wears Glasses: At all times Patient Visual Report: Blurring of vision;Other (comment)("curtain over eye"; film over eye) Additional Comments: will further assess; appears to be close to baseline     Perception Perception Comments: [place] tested?: Within functional limits    Pertinent Vitals/Pain Pain Assessment: No/denies pain     Hand Dominance Right   Extremity/Trunk Assessment Upper Extremity Assessment Upper Extremity Assessment: Overall WFL for tasks assessed   Lower Extremity Assessment Lower Extremity Assessment: Defer to PT evaluation RLE Deficits / Details: right leg with post polio deformities that is managed well with bracing and corrective shoe (she also had surgery on it years ago).     Cervical / Trunk Assessment Cervical / Trunk Assessment: Normal   Communication Communication  Communication: No difficulties   Cognition Arousal/Alertness: Awake/alert Behavior During Therapy: WFL for tasks assessed/performed Overall Cognitive Status: Within Functional Limits for tasks assessed                                     General Comments       Exercises     Shoulder  Instructions      Home Living Family/patient expects to be discharged to:: Private residence Living Arrangements: Children;Other relatives Available Help at Discharge: Family;Available 24 hours/day Type of Home: House Home Access: Stairs to enter [employer] of Steps: 4 Entrance Stairs-Rails: Right;Left;Can reach both Home Layout: One level     Bathroom Shower/Tub: Corporate investment banker: Handicapped height Bathroom Accessibility: Yes How Accessible: Accessible via walker Home Equipment: [place] - single point(thinks she has a tub bench)      Lives With: Daughter;Family    Prior Functioning/Environment Level of Independence: Independent        Comments: cares for her 2 y.o. grandson 2 days per week, drives, no longer works, and is generally independent.         OT Problem List: Decreased strength;Decreased activity tolerance;Impaired balance (sitting and/or standing);Decreased knowledge of use of DME or AE      OT Treatment/Interventions: Self-care/ADL training;Energy conservation;DME and/or AE instruction;Therapeutic activities;Patient/family education;Balance training    OT Goals(Current goals can be found in the care plan section) Acute Rehab OT Goals Patient Stated Goal: to return to caring for grand children OT Goal Formulation: With patient Time For Goal Achievement: 03/20/17 Potential to Achieve Goals: Good  OT Frequency: Min 2X/week   Barriers to D/C:            Co-evaluation              AM-PAC PT "6 Clicks" Daily Activity     Outcome Measure Help from another person eating meals?: None Help from another person taking care of personal grooming?: None Help from another person toileting, which includes using toliet, bedpan, or urinal?: A Little Help from another person bathing (including washing, rinsing, drying)?: A Little Help from another person to put on and taking off regular upper body clothing?: None Help from  another person to put on and taking off regular lower body clothing?: A Little 6 Click Score: 21   End of Session Equipment Utilized During Treatment: Gait belt Nurse Communication: Mobility status  Activity Tolerance: Patient tolerated treatment well Patient left: in chair;with call bell/phone within reach;with family/visitor present  OT Visit Diagnosis: Unsteadiness on feet (R26.81);Muscle weakness (generalized) (M62.81)                Time: 1497-0263 OT Time Calculation (min): 32 min Charges:  OT General Charges $OT Visit: 1 Visit OT Evaluation $OT Eval Moderate Complexity: 1 Mod OT Treatments $Self Care/Home Management : 8-22 mins G-Codes:     [facility] OT/L  [phone] 03/06/2017  Revecca Nachtigal,HILLARY 03/06/2017, 4:22 PM

## 2017-03-06 NOTE — Procedures (Signed)
S/P 4 vessel cerebral arteriogram. RT CFA approach. Findings. 1.Occluded previous Lt subclavian to Lt CCA graft. 2.90 % plus RT ICA prox stenosis. 3.Stable 45mm basilar apex and RT PCOM aneurysyms. 4.50 to 60 % stenosis of Lt ICA cavernous seg stenosis. 5.Approx 50 % stenosis of RT MCA M1 seg.

## 2017-03-06 NOTE — Sedation Documentation (Signed)
Groin hold complete, Bedrest beginning. Groin WNL

## 2017-03-06 NOTE — Progress Notes (Signed)
PT Cancellation Note  Patient Details Name: Paula Watson MRN: [id number] DOB: December 04, 1945   Cancelled Treatment:    Reason Eval/Treat Not Completed: (P) Medical issues which prohibited therapy(Pt currently on bed rest following procedure, will cancel tx.  )   Cristela Blue 03/06/2017, 11:52 AM  Governor Rooks, PTA pager [phone]

## 2017-03-06 NOTE — Progress Notes (Signed)
OT Cancellation Note  Patient Details Name: Paula Watson MRN: [id number] DOB: Aug 15, 1945   Cancelled Treatment:    Reason Eval/Treat Not Completed: Patient not medically ready. Pt currently on bedrest s/p IR procedure. Will assess this pm if able.     [place], OT/L  [phone] 03/06/2017 03/06/2017, 11:46 AM

## 2017-03-07 ENCOUNTER — Inpatient Hospital Stay (HOSPITAL_COMMUNITY): Payer: Medicare Other

## 2017-03-07 DIAGNOSIS — I639 Cerebral infarction, unspecified: Secondary | ICD-10-CM

## 2017-03-07 LAB — GLUCOSE, CAPILLARY
GLUCOSE-CAPILLARY: 143 mg/dL — AB (ref 65–99)
GLUCOSE-CAPILLARY: 101 mg/dL — AB (ref 65–99)
GLUCOSE-CAPILLARY: 155 mg/dL — AB (ref 65–99)
Glucose-Capillary: 100 mg/dL — ABNORMAL HIGH (ref 65–99)

## 2017-03-07 LAB — BASIC METABOLIC PANEL
Anion gap: 7 (ref 5–15)
BUN: 19 mg/dL (ref 6–20)
CALCIUM: 8.5 mg/dL — AB (ref 8.9–10.3)
CO2: 20 mmol/L — AB (ref 22–32)
CREATININE: 1.26 mg/dL — AB (ref 0.44–1.00)
Chloride: 109 mmol/L (ref 101–111)
GFR calc Af Amer: 48 mL/min — ABNORMAL LOW (ref >60)
GFR, EST NON AFRICAN AMERICAN: 42 mL/min — AB (ref >60)
GLUCOSE: 87 mg/dL (ref 65–99)
Potassium: 4.3 mmol/L (ref 3.5–5.1)
Sodium: 136 mmol/L (ref 135–145)

## 2017-03-07 LAB — URINALYSIS, ROUTINE W REFLEX MICROSCOPIC
BACTERIA UA: NONE SEEN
Bilirubin Urine: NEGATIVE
GLUCOSE, UA: NEGATIVE mg/dL
Ketones, ur: NEGATIVE mg/dL
Leukocytes, UA: NEGATIVE
Nitrite: NEGATIVE
PH: 5 (ref 5.0–8.0)
Protein, ur: NEGATIVE mg/dL
Specific Gravity, Urine: 1.009 (ref 1.005–1.030)

## 2017-03-07 LAB — CBC
HCT: 38.6 % (ref 36.0–46.0)
Hemoglobin: 12.7 g/dL (ref 12.0–15.0)
MCH: 28 pg (ref 26.0–34.0)
MCHC: 32.9 g/dL (ref 30.0–36.0)
MCV: 85 fL (ref 78.0–100.0)
PLATELETS: 204 10*3/uL (ref 150–400)
RBC: 4.54 MIL/uL (ref 3.87–5.11)
RDW: 13.3 % (ref 11.5–15.5)
WBC: 8.8 10*3/uL (ref 4.0–10.5)

## 2017-03-07 MED ORDER — OXYCODONE HCL 5 MG PO TABS
5.0000 mg | ORAL_TABLET | Freq: Four times a day (QID) | ORAL | Status: DC | PRN
  Administered 2017-03-07 – 2017-03-08 (×6): 5 mg via ORAL
  Filled 2017-03-07 (×6): qty 1

## 2017-03-07 MED ORDER — CYCLOBENZAPRINE HCL 10 MG PO TABS
5.0000 mg | ORAL_TABLET | Freq: Three times a day (TID) | ORAL | Status: DC | PRN
  Administered 2017-03-07: 5 mg via ORAL
  Filled 2017-03-07: qty 1

## 2017-03-07 NOTE — Progress Notes (Signed)
STROKE TEAM PROGRESS NOTE   SUBJECTIVE (INTERVAL HISTORY) No family members at bedside. Pt having flank pain - severe. She says it feels like pyelonephritis she had in the past. Dr. Erlinda Hong had ordered a renal ultrasound and it is pending. A urinalysis is negative. The patient is afebrile and her white count is normal. I changed the renal ultrasound to stat. Hopefully we'll have results soon. I also spoke with Dr. Estanislado Pandy today and he plans to see the patient as an outpatient to discuss stenting of the right internal carotid artery stenosis. I discussed this plan with the patient as well. I reinforced the importance of aspirin and Plavix therapy.   OBJECTIVE Temp:  [97.8 F (36.6 C)-98.9 F (37.2 C)] 98 F (36.7 C) (11/24 0900) Pulse Rate:  [63-73] 73 (11/24 0900) Cardiac Rhythm: Normal sinus rhythm (11/24 0718) Resp:  [18-20] 20 (11/24 0900) BP: (134-206)/(43-72) 175/46 (11/24 0900) SpO2:  [95 %-98 %] 96 % (11/24 0900)  Recent Labs  Lab 03/05/17 2208 03/06/17 0626 03/06/17 1148 03/06/17 2108 03/07/17 0624  GLUCAP 217* 102* 112* 169* 100*   Recent Labs  Lab 03/03/17 2111 03/03/17 2124 03/04/17 0454 03/05/17 0419 03/06/17 0407 03/07/17 0250  NA 137 140 140 139 137 136  K 4.0 3.9 3.7 3.4* 4.6 4.3  CL 108 107 112* 109 109 109  CO2 22  --  17* 23 20* 20*  GLUCOSE 126* 125* 98 91 116* 87  BUN 20 22* 22* 18 19 19   CREATININE 1.35* 1.30* 1.25* 1.20* 1.35* 1.26*  CALCIUM 9.2  --  8.7* 8.8* 8.9 8.5*   Recent Labs  Lab 03/03/17 2111  AST 21  ALT 14  ALKPHOS 71  BILITOT 0.7  PROT 6.9  ALBUMIN 4.0   Recent Labs  Lab 03/03/17 2111 03/03/17 2124 03/04/17 0454 03/05/17 0419 03/06/17 0407 03/07/17 0250  WBC 10.0  --  9.6 10.6* 9.7 8.8  NEUTROABS 5.4  --   --   --   --   --   HGB 13.9 14.3 13.3 13.0 13.2 12.7  HCT 42.1 42.0 40.7 39.4 40.0 38.6  MCV 85.7  --  86.0 85.8 85.7 85.0  PLT 265  --  218 212 223 204   No results for input(s): CKTOTAL, CKMB, CKMBINDEX, TROPONINI  in the last 168 hours. No results for input(s): LABPROT, INR in the last 72 hours. Recent Labs    03/07/17 0014  COLORURINE STRAW*  LABSPEC 1.009  PHURINE 5.0  GLUCOSEU NEGATIVE  HGBUR SMALL*  BILIRUBINUR NEGATIVE  KETONESUR NEGATIVE  PROTEINUR NEGATIVE  NITRITE NEGATIVE  LEUKOCYTESUR NEGATIVE       Component Value Date/Time   CHOL 152 03/04/2017 0454   CHOL 161 02/19/2017 1228   TRIG 139 03/04/2017 0454   HDL 41 03/04/2017 0454   HDL 48 02/19/2017 1228   CHOLHDL 3.7 03/04/2017 0454   VLDL 28 03/04/2017 0454   LDLCALC 83 03/04/2017 0454   LDLCALC 80 02/19/2017 1228   Lab Results  Component Value Date   HGBA1C 6.4 (H) 03/04/2017   No results found for: LABOPIA, COCAINSCRNUR, LABBENZ, AMPHETMU, THCU, LABBARB  No results for input(s): ETH in the last 168 hours.   IMAGING  I have personally reviewed the radiological images below and agree with the radiology interpretations.  Mri brain and Mra Head and neck Wo Contrast 03/04/2017 IMPRESSION:  1. Multiple punctate foci of acute subcortical ischemia within both frontal lobes, the right parietal lobe and left temporal lobe. No large territory infarct.  The distribution of lesions is suggestive of a cardiac or aortic embolic source.  2. Unchanged moderate to severe multifocal narrowing of the internal carotid arteries, worst at the left clinoid segment.  3. 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  4. Unchanged loss of flow related enhancement of the distal right vertebral artery. MRA of the neck shows no flow related enhancement along the cervical segments of the right vertebral artery. This may indicate occlusion, but the MRA is degraded by the lack of IV contrast. This could be further characterized with CTA of the neck or ultrasound.  5. Chronic occlusion of the left common carotid artery, status post subclavian to carotid bypass. The cervical  portions of the internal carotid arteries are poorly assessed due to lack of IV contrast. This also could be further characterized with CTA of the neck or ultrasound.   Ct Head Code Stroke Wo Contrast 03/03/2017 IMPRESSION:  1. No acute intracranial abnormality identified.  2. Stable small chronic infarction in right cerebellar hemisphere and chronic lacunar infarcts in basal ganglia.  3. Stable chronic microvascular ischemic changes and parenchymal volume loss of the brain.  4. ASPECTS is 10.   Ct Angio Head and neck W Or Wo Contrast 03/04/2017 IMPRESSION:  1. No intracranial large vessel occlusion.  No acute hemorrhage.  2. Proximal right internal carotid artery stenosis measuring 90% secondary to the presence of predominantly calcified plaque.  3. Chronic occlusion of the left common carotid artery at its origin. There is no flow seen within the left subclavian to ICA graft. The left carotid bifurcation and internal and external carotid arteries do show opacification. There is approximately 75% stenosis of the proximal left internal carotid artery. This measurement is likely underestimating the degree of stenosis, because of the small caliber of the distal ICA.  4. Bilateral moderate to severe atherosclerotic narrowing of the internal carotid arteries at the skullbase.  5. 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   Cerebral Angiogram - Dr Estanislado Pandy 03/06/2017 S/P 4 vessel cerebral arteriogram. RT CFA approach. Findings. 1.Occluded previous Lt subclavian to Lt CCA graft. 2.90 % plus RT ICA prox stenosis. 3.Stable 33mm basilar apex and RT PCOM aneurysyms. 4.50 to 60 % stenosis of Lt ICA cavernous seg stenosis. 5.Approx 50 % stenosis of RT MCA M1 seg.  2D Echocardiogram - Left ventricle: The cavity size was normal. Wall thickness was   increased in a pattern of moderate LVH. Systolic function was   normal. The estimated ejection fraction was in the range of  60%   to 65%. Wall motion was normal; there were no regional wall   motion abnormalities. Left ventricular diastolic function   parameters were normal. - Atrial septum: No defect or patent foramen ovale was identified. Impressions: - No cardiac source of emboli was indentified.   PHYSICAL EXAM  Temp:  [97.8 F (36.6 C)-98.9 F (37.2 C)] 98 F (36.7 C) (11/24 0900) Pulse Rate:  [63-73] 73 (11/24 0900) Resp:  [18-20] 20 (11/24 0900) BP: (134-206)/(43-72) 175/46 (11/24 0900) SpO2:  [95 %-98 %] 96 % (11/24 0900)  General - Well nourished, well developed, in no apparent distress.  Ophthalmologic - fundi not visualized due to noncooperation.  Cardiovascular - Regular rate and rhythm with no murmur.  Mental Status -  Level of arousal and orientation to time, place, and person were intact. Language including expression, naming, repetition, comprehension was assessed  and found intact. Fund of Knowledge was assessed and was intact.  Cranial Nerves II - XII - II - Visual field intact OU. III, IV, VI - Extraocular movements intact. V - Facial sensation intact bilaterally. VII - Facial movement intact bilaterally. VIII - Hearing & vestibular intact bilaterally. X - Palate elevates symmetrically. XI - Chin turning & shoulder shrug intact bilaterally. XII - Tongue protrusion intact.  Motor Strength - The patient's strength was normal in all extremities except right foot 3/5 PF and DF due to hx of polio and pronator drift was absent.  Bulk was normal and fasciculations were absent.   Motor Tone - Muscle tone was assessed at the neck and appendages and was normal.  Reflexes - The patient's reflexes were symmetrical in all extremities and she had no pathological reflexes.  Sensory - Light touch, temperature/pinprick were assessed and were symmetrical.    Coordination - The patient had normal movements in the hands with no ataxia or dysmetria.  Tremor was absent.  Gait and Station -  deferred.  Vitals:   03/07/17 0136 03/07/17 0452 03/07/17 0900 03/07/17 1351  BP: (!) 134/43 (!) 177/67 (!) 175/46 (!) 168/53  Pulse: 63 67 73 70  Resp: 18 18 20 19   Temp: 97.8 F (36.6 C) 98.5 F (36.9 C) 98 F (36.7 C) 98.2 F (36.8 C)  TempSrc: Oral Oral Oral Oral  SpO2: 95% 95% 96% 93%  Weight:      Height:          ASSESSMENT/PLAN Ms. Paula Watson is a 71 y.o. female with history of CKD, CAD, DM, HLD, HTN, polil, TIA, BA tip aneurysm admitted for recurrent left arm and facial weakness, slurry speech. TPA given 03/03/2017.  Stroke:  bilateral MCA/ACA and MCA/PCA four punctate infarcts - could be due to high grade stenosis of right ICA and occluded left subclavian to CCA bypass with left ICA 50-60% stenosis. However, cardioemoblic can not be completely ruled out due to episodes of heart palpitation at home.   Resultant back to baseline  MRI 4 punctate foci of acute subcortical ischemia within both frontal lobes, the right parietal lobe and left temporal lobe  MRA  60mm BA tip aneurysm, stable  CTA head and neck high grade stenosis of right ICA and occluded left subclavian to CCA bypass with left ICA > 75% stenosis. Stable BA aneurysm.  Cerebral Angiogram - Occluded previous Lt subclavian to Lt CCA graft. 90 % plus RT ICA prox stenosis. 50 to 60 % stenosis of Lt ICA cavernous seg stenosis  2D Echo EF 60-65%  VVS consulted for right CEA (see below)  LDL 83  HgbA1c 6.4  SCDs for VTE prophylaxis  Diet Carb Modified Fluid consistency: Thin; Room service appropriate? Yes   aspirin 81 mg daily prior to admission, now on ASA and plavix dual antiplatelet.   Patient counseled to be compliant with her antithrombotic medications  Ongoing aggressive stroke risk factor management  Therapy recommendations:  HH PT and OT  Disposition:  Pending  Carotid stenosis bilateral  CUS 10/2005 right ICA patent  CUS 08/2014 right ICA 50-69% stenosis but could be underestimating  due to plaque  Right ICA 90% stenosis on CTA this time.  Left subclavian to CCA bypass in 2000  10/2005 underwent left subclavian artery angioplasty due to stenosis  08/2014 CUS showed bypass was patent with low resistance antegrade flow   Current CTA showed bypass closed.  Cerebral angio Occluded previous Lt subclavian to Lt CCA  graft. 90 % plus RT ICA prox stenosis. 50 to 60 % stenosis of Lt ICA cavernous seg stenosis  Heart palpitation  More frequent for the last one month, especially at night  Followed with PCP and put on coreg and getting improved  Has cardiology referral in [place] on 03/13/17  Recommend outpt follow up with cardiology to consider loop recorder for arrhythmia evaluation given current stroke. Pt understood that she needs to request medical record sent to her cardiologist to facilitate the process. I can not find her cardiologist appointment via Epic system at this time.  Hx of TIA  02/2011 transient right arm weakness - MRI neg, CUS showed right ICA 40-59% stenosis, EF 60-65% - ASA changed to plavix  Hx of BA tip aneurysm  MRA showed stable aneurysm  DSA also showed stable 44mm BA aneurysm  Following with Dr. Estanislado Pandy  Diabetes  HgbA1c 6.4 goal < 7.0  Controlled  Currently on SSI  CBG monitoring  SSI  close PCP follow up  Hypertension Stable, on the high side Permissive hypertension (OK if <180/105) for 24-48 hours post stroke and then gradually normalized within 5-7 days. Coreg resumed, but other home BP meds on hold  Long term BP goal 130-160 now due to b/l carotid disease  Avoid low BP at home  Hyperlipidemia  Home meds:  pravastatin 10  LDL 83, goal < 70  Now on pravastatin 20  Continue statin at discharge  Tobacco abuse  Current smoker  Smoking cessation counseling provided  Pt is willing to quit  Other Stroke Risk Factors  Advanced age  Coronary artery disease  Other Active Problems  CKD stage II, Cre  1.30->1.25->1.20->1.35->1.26  Hx of polio - right foot DF/PF impairment    FLANK PAIN  Renal US (stat) - pending  U/A negative  WBCs - 8.8  VSS - Bp 168/53  Temp - 98.2  Oxycodon (Dr Aroor) ordered for pain.  ? Renal calculi  May need CT  Discussed with pt's nurse   PLAN  90% Rt ICA stenosis ; Occluded Lt carotid - Per Dr Scot Dock - Rt stenosis is not surgically accessible. Dr. Scot Dock will discuss possible stenting with Dr. Estanislado Pandy.  BA tip aneurysm - following with Dr Estanislado Pandy  Outpatient follow-up with cardiology for possible loop placement  Home health PT and OT  Continue aspirin 325 mg daily and Plavix 75 mg daily.  Continue statin (Pravachol 20 mg daily) (consider high dose statin)  Dr. Patrecia Pour will see [facility] day # 4   Personally examined patient and images, and have participated in and made any corrections needed to history, physical, neuro exam,assessment and plan as stated above.  I have personally obtained the history, evaluated lab date, reviewed imaging studies and agree with radiology interpretations.    Sarina Ill, MD Stroke Neurology    To contact Stroke Continuity provider, please refer to [email or web address] After hours, contact General Neurology

## 2017-03-07 NOTE — Progress Notes (Signed)
Patient complaint of bilateral flank and back pain all night.  She said it felt like her back is being twisted and crused.  Doctor on call has been notified,  Urinalysis collected- Negative,  Oxy IR helped with temporary pain relieve.  For Ultrasound Kidney in the morning.  Will continue to monitor. Patient is afebrile.

## 2017-03-07 NOTE — Progress Notes (Addendum)
The patient continues to have bilateral posterior flank / paralumbar pain which she states is dull and constant. She rates it as a 9.5 on a 1-10 scale. There is some temporary relief with oxycodone. The pain started on Thanksgiving day and has steadily grown worse since then. She compares it to pain that she experienced in the past with pyelonephritis although she admits she has had some low back pain in the past as well. She describes urinary urgency and some discomfort while urinating but feels she is emptying her bladder completely. She feels her stomach is a little upset; however, there is no pain with palpation of her abdomen or her back.  Renal US 03/07/2017 IMPRESSION: Bilateral renal cysts without sonographic evidence for acute pathology. No evidence for obstruction or hydronephrosis.  EXAM Vitals:   03/07/17 0136 03/07/17 0452 03/07/17 0900 03/07/17 1351  BP: (!) 134/43 (!) 177/67 (!) 175/46 (!) 168/53  Pulse: 63 67 73 70  Resp: 18 18 20 19   Temp: 97.8 F (36.6 C) 98.5 F (36.9 C) 98 F (36.7 C) 98.2 F (36.8 C)  TempSrc: Oral Oral Oral Oral  SpO2: 95% 95% 96% 93%  Weight:      Height:       Pleasant 71 year old female in bed in no severe distress.  Heart - regular rate and rhythm Lungs - clear Abdomen - soft - nontender to palpation Lumbar area - no tenderness to palpation.  Bmet - BUN 19 ; Ceat 1.26 ; GFR 42 CBC - Hb - 12.7 ; Hct - 38.6 ; WBC - 8.8   U/A APPearance CLEAR CLEAR      Specific Gravity, Urine 1.005 - 1.030 1.009      pH 5.0 - 8.0 5.0      Glucose, UA NEGATIVE mg/dL NEGATIVE      Hgb urine dipstick NEGATIVE SMALL Abnormal       Bilirubin Urine NEGATIVE NEGATIVE      Ketones, ur NEGATIVE mg/dL NEGATIVE      Protein, ur NEGATIVE mg/dL NEGATIVE      Nitrite NEGATIVE NEGATIVE      Leukocytes, UA NEGATIVE NEGATIVE      RBC / HPF 0 - 5 RBC/hpf 0-5      WBC, UA 0 - 5 WBC/hpf 0-5      Bacteria, UA NONE SEEN NONE SEEN      Squamous Epithelial / LPF NONE  SEEN 0-5 Abnormal         PLAN  Discussed the situation with Dr. Jaynee Eagles. Will order CT of abdomen and pelvis with and without contrast for further evaluation. Will also try low dose flexeril as this may be back pain / spasm from prolonged bedrest. Will ask overnight neurology coverage, Dr Aroor, to F/U. On study.   Mikey Bussing PA-C [facility] Pager [phone] 03/08/2017, 7:57 AM

## 2017-03-07 NOTE — Progress Notes (Signed)
Occupational Therapy Treatment Patient Details Name: Paula Watson MRN: [id number] DOB: 08/09/1945 Today's Date: 03/07/2017    History of present illness 71 y.o. female admitted on 03/03/17 with slurred speech and left sided weakness.  Pt given tPA and MRI revealed bil MCA/ACA and MCA/PCA four punctate infarcts embolic pattern concerning for undiagnosed A-fib.  She is due to have a TEE on 11/23 and loop recorder placement.  Pt with significant PMH of vertigo (takes antrivert daily), stroke in 2007, polio affecting R side (with right foot reconstruction AFO and special custom shoe), HTN, DM, CAD, CKD, cervical CA, aneurysm in her brain (stable), anemia, and left carotid endarterectomy.  Cerebral angiogram 11/23.    OT comments  Pt c/o back pain today which limited her participation in ADLs and affected functional mobility.  Pt grabbing for furniture while ambulating in room.  Therapist provided energy conservation handout and reviewed strategies to implement at home.  Will plan to practice tub transfer next session pending decrease in back pain.   Follow Up Recommendations  Home health OT;Supervision - Intermittent    Equipment Recommendations  3 in 1 bedside commode    Recommendations for Other Services      Precautions / Restrictions Precautions Precautions: Fall Precaution Comments: due to dizziness and imbalance.        Mobility Bed Mobility Overal bed mobility: Modified Independent                Transfers Overall transfer level: Needs assistance Equipment used: None Transfers: Sit to/from Stand Sit to Stand: Min guard         General transfer comment: Min guard for safety    Balance                                           ADL either performed or assessed with clinical judgement   ADL Overall ADL's : Needs assistance/impaired     Grooming: Set up;Standing;Min guard                   Toilet Transfer: Minimal assistance;Comfort  height toilet;Ambulation   Toileting- Clothing Manipulation and Hygiene: Min guard;Sit to/from stand       Functional mobility during ADLs: Minimal assistance General ADL Comments: Therapist provided energy conservation handout during session and discussed strategies/tools to implement during ADLs, including: seat in shower, seated rest breaks, placement of ADL objects.  Pt declining practicing tub transfer today due to back pain.     Vision       Perception     Praxis      Cognition Arousal/Alertness: Awake/alert Behavior During Therapy: WFL for tasks assessed/performed Overall Cognitive Status: Within Functional Limits for tasks assessed                                          Exercises     Shoulder Instructions       General Comments      Pertinent Vitals/ Pain       Pain Assessment: 0-10 Pain Score: 5  Pain Location: back Pain Descriptors / Indicators: Squeezing Pain Intervention(s): Limited activity within patient's tolerance  Home Living  Prior Functioning/Environment              Frequency  Min 2X/week        Progress Toward Goals  OT Goals(current goals can now be found in the care plan section)  Progress towards OT goals: Progressing toward goals  Acute Rehab OT Goals Patient Stated Goal: to return to caring for grand children OT Goal Formulation: With patient Time For Goal Achievement: 03/20/17 Potential to Achieve Goals: Good ADL Goals Pt Will Perform Tub/Shower Transfer: with supervision;3 in 1;ambulating;with caregiver independent in assisting Additional ADL Goal #1: Pt will independently verbalize 3 strategies to reduce risk of falls  Plan Discharge plan remains appropriate    Co-evaluation                 AM-PAC PT "6 Clicks" Daily Activity     Outcome Measure   Help from another person eating meals?: None Help from another person taking care  of personal grooming?: None Help from another person toileting, which includes using toliet, bedpan, or urinal?: A Little Help from another person bathing (including washing, rinsing, drying)?: A Little Help from another person to put on and taking off regular upper body clothing?: None Help from another person to put on and taking off regular lower body clothing?: A Little 6 Click Score: 21    End of Session    OT Visit Diagnosis: Unsteadiness on feet (R26.81);Muscle weakness (generalized) (M62.81)   Activity Tolerance Patient limited by pain   Patient Left in bed;with call bell/phone within reach;with bed alarm set   Nurse Communication Mobility status        Time: [id number] OT Time Calculation (min): 20 min  Charges: OT General Charges $OT Visit: 1 Visit OT Treatments $Self Care/Home Management : 8-22 mins     Darrol Jump OTR/L 03/07/2017, 1:28 PM

## 2017-03-07 NOTE — Progress Notes (Signed)
Physical Therapy Treatment Patient Details Name: Paula Watson MRN: [id number] DOB: 1945/08/20 Today's Date: 03/07/2017    History of Present Illness 72 y.o. female admitted on 03/03/17 with slurred speech and left sided weakness.  Pt given tPA and MRI revealed bil MCA/ACA and MCA/PCA four punctate infarcts embolic pattern concerning for undiagnosed A-fib.  She is due to have a TEE on 11/23 and loop recorder placement.  Pt with significant PMH of vertigo (takes antrivert daily), stroke in 2007, polio affecting R side (with right foot reconstruction AFO and special custom shoe), HTN, DM, CAD, CKD, cervical CA, aneurysm in her brain (stable), anemia, and left carotid endarterectomy.      PT Comments    Pt experiencing 7/10 in her flanks when PT entered room. Pt reported she had been given pain medication and it had not helped and felt she could only make it to the bathroom as she needed to urinate. Pt currently mod I for bed mobility,and min guard for transfers. Without donning her AFO pt is minA for ambulation without AD and min guard for ambulation with RW. Pt reported increased pain with urination. After pt got back in bed she was taken for [place]. D/c plan remains appropriate. PT will continue to follow acutely.     Follow Up Recommendations  Home health PT;Supervision for mobility/OOB     Equipment Recommendations  None recommended by PT    Recommendations for Other Services       Precautions / Restrictions Precautions Precautions: Fall Precaution Comments: due to dizziness and imbalance.  Required Braces or Orthoses: Other Brace/Splint Other Brace/Splint: AFO on L LE Restrictions Weight Bearing Restrictions: No    Mobility  Bed Mobility Overal bed mobility: Modified Independent                Transfers Overall transfer level: Needs assistance Equipment used: None Transfers: Sit to/from Stand Sit to Stand: Min guard         General transfer comment: Min guard  assist for safety, good power up extra time needed to steady herself before moving  Ambulation/Gait Ambulation/Gait assistance: Min assist;Min guard   Assistive device: 1 person hand held assist;Rolling walker (2 wheeled) Gait Pattern/deviations: Step-through pattern;Staggering left;Staggering right Gait velocity: decreased Gait velocity interpretation: Below normal speed for age/gender General Gait Details: pt ambulated to bathroom utilizing furniture for steadying required minA to sfeady when she tried to use rolling drawers to steady herself and they rolled away from her. Pt educated on need to utilize RW for stability when not using her AFO even if just for short distances like to the bathroom.       Modified Rankin (Stroke Patients Only) Modified Rankin (Stroke Patients Only) Pre-Morbid Rankin Score: No significant disability Modified Rankin: Moderately severe disability     Balance Overall balance assessment: Needs assistance Sitting-balance support: Feet supported;No upper extremity supported Sitting balance-Leahy Scale: Fair     Standing balance support: No upper extremity supported Standing balance-Leahy Scale: Fair                              Cognition Arousal/Alertness: Awake/alert Behavior During Therapy: WFL for tasks assessed/performed Overall Cognitive Status: Within Functional Limits for tasks assessed  Pertinent Vitals/Pain Pain Assessment: 0-10 Pain Score: 7  Pain Location: back Pain Descriptors / Indicators: Squeezing Pain Intervention(s): Limited activity within patient's tolerance;Monitored during session           PT Goals (current goals can now be found in the care plan section) Acute Rehab PT Goals Patient Stated Goal: to figure out what is going on with her, get the dizziness straightened out PT Goal Formulation: With patient Time For Goal Achievement:  03/18/17 Potential to Achieve Goals: Good Progress towards PT goals: PT to reassess next treatment    Frequency    Min 4X/week      PT Plan         AM-PAC PT "6 Clicks" Daily Activity  Outcome Measure  Difficulty turning over in bed (including adjusting bedclothes, sheets and blankets)?: None Difficulty moving from lying on back to sitting on the side of the bed? : None Difficulty sitting down on and standing up from a chair with arms (e.g., wheelchair, bedside commode, etc,.)?: A Little Help needed moving to and from a bed to chair (including a wheelchair)?: A Little Help needed walking in hospital room?: A Little Help needed climbing 3-5 steps with a railing? : A Lot 6 Click Score: 19    End of Session Equipment Utilized During Treatment: Gait belt Activity Tolerance: Patient tolerated treatment well Patient left: in chair;with call bell/phone within reach Nurse Communication: Mobility status PT Visit Diagnosis: Other symptoms and signs involving the nervous system (R29.898);History of falling (Z91.81);Unsteadiness on feet (R26.81);Other abnormalities of gait and mobility (R26.89)     Time: 1520-1530 PT Time Calculation (min) (ACUTE ONLY): 10 min  Charges:  $Gait Training: 8-22 mins                    G Codes:       Alleya Demeter B. Migdalia Dk PT, DPT Acute Rehabilitation  [phone] Pager [phone]     [place] 03/07/2017, 3:41 PM

## 2017-03-08 ENCOUNTER — Inpatient Hospital Stay (HOSPITAL_COMMUNITY): Payer: Medicare Other

## 2017-03-08 LAB — CBC
HEMATOCRIT: 38.2 % (ref 36.0–46.0)
Hemoglobin: 12.6 g/dL (ref 12.0–15.0)
MCH: 28.1 pg (ref 26.0–34.0)
MCHC: 33 g/dL (ref 30.0–36.0)
MCV: 85.3 fL (ref 78.0–100.0)
Platelets: 223 10*3/uL (ref 150–400)
RBC: 4.48 MIL/uL (ref 3.87–5.11)
RDW: 13.2 % (ref 11.5–15.5)
WBC: 7.7 10*3/uL (ref 4.0–10.5)

## 2017-03-08 LAB — BASIC METABOLIC PANEL
Anion gap: 8 (ref 5–15)
BUN: 19 mg/dL (ref 6–20)
CALCIUM: 8.7 mg/dL — AB (ref 8.9–10.3)
CO2: 21 mmol/L — AB (ref 22–32)
CREATININE: 1.41 mg/dL — AB (ref 0.44–1.00)
Chloride: 107 mmol/L (ref 101–111)
GFR calc non Af Amer: 37 mL/min — ABNORMAL LOW (ref >60)
GFR, EST AFRICAN AMERICAN: 42 mL/min — AB (ref >60)
GLUCOSE: 76 mg/dL (ref 65–99)
Potassium: 3.9 mmol/L (ref 3.5–5.1)
Sodium: 136 mmol/L (ref 135–145)

## 2017-03-08 LAB — GLUCOSE, CAPILLARY
Glucose-Capillary: 88 mg/dL (ref 65–99)
Glucose-Capillary: 128 mg/dL — ABNORMAL HIGH (ref 65–99)

## 2017-03-08 MED ORDER — PRAVASTATIN SODIUM 20 MG PO TABS: 20.0000 mg | ORAL_TABLET | Freq: Every day | ORAL | 11 refills | Status: DC

## 2017-03-08 MED ORDER — BISACODYL 5 MG PO TBEC: 5.0000 mg | DELAYED_RELEASE_TABLET | Freq: Every day | ORAL | 0 refills | Status: DC | PRN

## 2017-03-08 MED ORDER — CLOPIDOGREL BISULFATE 75 MG PO TABS: 75.0000 mg | ORAL_TABLET | Freq: Every day | ORAL | 2 refills | Status: DC

## 2017-03-08 MED ORDER — OXYCODONE HCL 5 MG PO TABS: 5.0000 mg | ORAL_TABLET | Freq: Four times a day (QID) | ORAL | 0 refills | Status: DC | PRN

## 2017-03-08 MED ORDER — ASPIRIN 325 MG PO TBEC: 325.0000 mg | DELAYED_RELEASE_TABLET | Freq: Every day | ORAL | 0 refills | Status: AC

## 2017-03-08 NOTE — Discharge Instructions (Signed)
1. Home physical and occupational therapists will visit to continue therapy.     A nurse will visit to monitor your blood pressure, pain, blood glucose, and medication requirements. 2. Avoid low blood pressure until blockage in carotid artery has been repaired. 3. If pain progresses contact your primary care provider and for urologist. 4. Follow-up with physicians as recommended. 5. Take medications as prescribed. 6. Drink plenty of fluids. 7. Gradually increase your activity as tolerated.

## 2017-03-08 NOTE — Discharge Summary (Signed)
Stroke Discharge Summary  Patient ID: Paula Watson   MRN: [id number]      DOB: [birth year]  Date of Admission: 03/03/2017 Date of Discharge: 03/08/2017  Attending Physician:  Rosalin Hawking, MD, Stroke MD Consultant(s):   Treatment Team:  Angelia Mould, MD vascular surgery  Patient's PCP:  Terald Sleeper, PA-C  DISCHARGE DIAGNOSIS: Multiple punctate foci of acute subcortical ischemia within both frontal lobes, the right parietal lobe and left temporal lobe. Active Problems:   Stroke (cerebrum) [facility])   Past Medical History:  Diagnosis Date  . Anemia   . Aneurysm of artery ([place])   . Blood transfusion   . Bronchitis    "have had a couple of times in my lifetime"  . Cancer ([place]) 1985   cervical  . Cataract   . CKD (chronic kidney disease) stage 1, GFR 90 ml/min or greater was told by her PCP   Intolerant to ACE according to her PCP  . Coronary artery disease   . Diabetes mellitus   . GERD (gastroesophageal reflux disease)   . Hemorrhoids   . High cholesterol   . Hyperlipidemia   . Hypertension   . Polio 1953  . Poliomyelitis since 1948   has drop foot on R  . Stroke [facility]) 2007 affected R side  . Vertigo now improved   Past Surgical History:  Procedure Laterality Date  . CAROTID ENDARTERECTOMY Left 2000 L side  . CERVICAL CONIZATION W/BX  1985  . CORONARY ANGIOPLASTY  stented 2007  . DILATION AND CURETTAGE OF UTERUS  1980's  . foot reconstructjion  1953   right foot; post polio  . IR RADIOLOGIST EVAL & MGMT  07/21/2016  . TONSILLECTOMY  1954    Allergies as of 03/08/2017      Reactions   Penicillins Hives, Swelling   Has patient had a PCN reaction causing immediate rash, facial/tongue/throat swelling, SOB or lightheadedness with hypotension: Yes Has patient had a PCN reaction causing severe rash involving mucus membranes or skin necrosis: Unknown Has patient had a PCN reaction that required hospitalization: No Has patient had a PCN reaction occurring  within the last 10 years: No If all of the above answers are "NO", then may proceed with Cephalosporin use.      Medication List    STOP taking these medications   amLODipine 10 MG tablet Commonly known as:  NORVASC   aspirin 81 MG chewable tablet Replaced by:  aspirin 325 MG EC tablet   lisinopril 20 MG tablet Commonly known as:  PRINIVIL,ZESTRIL   meclizine 25 MG tablet Commonly known as:  ANTIVERT     TAKE these medications   albuterol 108 (90 Base) MCG/ACT inhaler Commonly known as:  PROVENTIL HFA;VENTOLIN HFA Inhale 2 puffs into the lungs every 6 (six) hours as needed for wheezing or shortness of breath.   aspirin 325 MG EC tablet Take 1 tablet (325 mg total) by mouth daily. Start taking on:  03/09/2017 Replaces:  aspirin 81 MG chewable tablet   bisacodyl 5 MG EC tablet Commonly known as:  DULCOLAX Take 1 tablet (5 mg total) by mouth daily as needed for moderate constipation.   carvedilol 6.25 MG tablet Commonly known as:  COREG Take 1 tablet (6.25 mg total) 2 (two) times daily with a meal by mouth.   clopidogrel 75 MG tablet Commonly known as:  PLAVIX Take 1 tablet (75 mg total) by mouth daily. Start taking on:  03/09/2017   clotrimazole-betamethasone  lotion Commonly known as:  LOTRISONE APPLY TOPICALLY TWO TIMES DAILY   famotidine 20 MG tablet Commonly known as:  PEPCID Take 2 tablets (40 mg total) by mouth daily. What changed:  when to take this   fluticasone 50 MCG/ACT nasal spray Commonly known as:  FLONASE Place 1 spray into both nostrils 2 (two) times daily.   hydrocortisone 2.5 % rectal cream Commonly known as:  ANUSOL-HC Place 1 application rectally 2 (two) times daily.   metFORMIN 500 MG 24 hr tablet Commonly known as:  GLUCOPHAGE-XR Take 1 tablet (500 mg total) by mouth 2 (two) times daily.   mupirocin cream 2 % Commonly known as:  BACTROBAN Apply 1 application topically 2 (two) times daily. What changed:    when to take  this  reasons to take this   oxyCODONE 5 MG immediate release tablet Commonly known as:  Oxy IR/ROXICODONE Take 1 tablet (5 mg total) by mouth every 6 (six) hours as needed for moderate pain.   pravastatin 20 MG tablet Commonly known as:  PRAVACHOL Take 1 tablet (20 mg total) by mouth daily. What changed:    medication strength  how much to take       LABORATORY STUDIES CBC    Component Value Date/Time   WBC 7.7 03/08/2017 0234   RBC 4.48 03/08/2017 0234   HGB 12.6 03/08/2017 0234   HGB 13.5 02/19/2017 1228   HCT 38.2 03/08/2017 0234   HCT 40.6 02/19/2017 1228   PLT 223 03/08/2017 0234   PLT 274 02/19/2017 1228   MCV 85.3 03/08/2017 0234   MCV 85 02/19/2017 1228   MCH 28.1 03/08/2017 0234   MCHC 33.0 03/08/2017 0234   RDW 13.2 03/08/2017 0234   RDW 14.3 02/19/2017 1228   LYMPHSABS 3.5 03/03/2017 2111   LYMPHSABS 2.2 02/19/2017 1228   MONOABS 0.8 03/03/2017 2111   EOSABS 0.4 03/03/2017 2111   EOSABS 0.3 02/19/2017 1228   BASOSABS 0.0 03/03/2017 2111   BASOSABS 0.0 02/19/2017 1228   CMP    Component Value Date/Time   NA 136 03/08/2017 0234   NA 142 02/19/2017 1228   K 3.9 03/08/2017 0234   CL 107 03/08/2017 0234   CO2 21 (L) 03/08/2017 0234   GLUCOSE 76 03/08/2017 0234   BUN 19 03/08/2017 0234   BUN 23 02/19/2017 1228   CREATININE 1.41 (H) 03/08/2017 0234   CALCIUM 8.7 (L) 03/08/2017 0234   PROT 6.9 03/03/2017 2111   PROT 6.0 02/19/2017 1228   ALBUMIN 4.0 03/03/2017 2111   ALBUMIN 4.1 02/19/2017 1228   AST 21 03/03/2017 2111   ALT 14 03/03/2017 2111   ALKPHOS 71 03/03/2017 2111   BILITOT 0.7 03/03/2017 2111   BILITOT 0.3 02/19/2017 1228   GFRNONAA 37 (L) 03/08/2017 0234   GFRAA 42 (L) 03/08/2017 0234   COAGS Lab Results  Component Value Date   INR 0.92 03/03/2017   INR 0.99 02/19/2011   Lipid Panel    Component Value Date/Time   CHOL 152 03/04/2017 0454   CHOL 161 02/19/2017 1228   TRIG 139 03/04/2017 0454   HDL 41 03/04/2017 0454    HDL 48 02/19/2017 1228   CHOLHDL 3.7 03/04/2017 0454   VLDL 28 03/04/2017 0454   LDLCALC 83 03/04/2017 0454   LDLCALC 80 02/19/2017 1228   HgbA1C  Lab Results  Component Value Date   HGBA1C 6.4 (H) 03/04/2017   Urinalysis    Component Value Date/Time   COLORURINE STRAW (A) 03/07/2017 0014  APPEARANCEUR CLEAR 03/07/2017 0014   LABSPEC 1.009 03/07/2017 0014   PHURINE 5.0 03/07/2017 0014   GLUCOSEU NEGATIVE 03/07/2017 0014   HGBUR SMALL (A) 03/07/2017 0014   BILIRUBINUR NEGATIVE 03/07/2017 0014   KETONESUR NEGATIVE 03/07/2017 0014   PROTEINUR NEGATIVE 03/07/2017 0014   UROBILINOGEN 0.2 07/22/2013 1633   NITRITE NEGATIVE 03/07/2017 0014   LEUKOCYTESUR NEGATIVE 03/07/2017 0014   Urine Drug Screen No results found for: LABOPIA, COCAINSCRNUR, LABBENZ, AMPHETMU, THCU, LABBARB  Alcohol Level No results found for: ETH   SIGNIFICANT DIAGNOSTIC STUDIES  Mri brain and Mra Head and neck Wo Contrast 03/04/2017 IMPRESSION:  1.  No large territory infarct. The distribution of lesions is suggestive of a cardiac or aortic embolic source.  2. Unchanged moderate to severe multifocal narrowing of the internal carotid arteries, worst at the left clinoid segment.  3. 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  4. Unchanged loss of flow related enhancement of the distal right vertebral artery. MRA of the neck shows no flow related enhancement along the cervical segments of the right vertebral artery. This may indicate occlusion, but the MRA is degraded by the lack of IV contrast. This could be further characterized with CTA of the neck or ultrasound.  5. Chronic occlusion of the left common carotid artery, status post subclavian to carotid bypass. The cervical portions of the internal carotid arteries are poorly assessed due to lack of IV contrast. This also could be further characterized with CTA of the neck or  ultrasound.   Ct Head Code Stroke Wo Contrast 03/03/2017 IMPRESSION:  1. No acute intracranial abnormality identified.  2. Stable small chronic infarction in right cerebellar hemisphere and chronic lacunar infarcts in basal ganglia.  3. Stable chronic microvascular ischemic changes and parenchymal volume loss of the brain.  4. ASPECTS is 10.   Ct Angio Head and neck W Or Wo Contrast 03/04/2017 IMPRESSION:  1. No intracranial large vessel occlusion.  No acute hemorrhage.  2. Proximal right internal carotid artery stenosis measuring 90% secondary to the presence of predominantly calcified plaque.  3. Chronic occlusion of the left common carotid artery at its origin. There is no flow seen within the left subclavian to ICA graft. The left carotid bifurcation and internal and external carotid arteries do show opacification. There is approximately 75% stenosis of the proximal left internal carotid artery. This measurement is likely underestimating the degree of stenosis, because of the small caliber of the distal ICA.  4. Bilateral moderate to severe atherosclerotic narrowing of the internal carotid arteries at the skullbase.  5. 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   Cerebral Angiogram - Dr Estanislado Pandy 03/06/2017 S/P 4 vessel cerebral arteriogram. RT CFA approach. Findings. 1.Occluded previous Lt subclavian to Lt CCA graft. 2.90 % plus RT ICA prox stenosis. 3.Stable 52mm basilar apex and RT PCOM aneurysyms. 4.50 to 60 % stenosis of Lt ICA cavernous seg stenosis. 5.Approx 50 % stenosis of RT MCA M1 seg.  2D Echocardiogram - Left ventricle: The cavity size was normal. Wall thickness was increased in a pattern of moderate LVH. Systolic function was normal. The estimated ejection fraction was in the range of 60% to 65%. Wall motion was normal; there were no regional wall motion abnormalities. Left ventricular diastolic function parameters were  normal. - Atrial septum: No defect or patent foramen ovale was identified. Impressions: - No cardiac source of emboli was indentified.  Renal Ultrasound 03/07/2017 IMPRESSION: Bilateral renal cysts without sonographic evidence for acute pathology. No evidence for obstruction or hydronephrosis.   CT Renal Stone Study 03/08/2017 IMPRESSION: 1. 5 mm nonobstructive calculus in the upper pole collecting system of the right kidney. No ureteral stones or findings of urinary tract obstruction are noted at this time. 2. There is a combination of biliary sludge and small gallstones in the gallbladder. However, there are no findings to suggest an acute cholecystitis at this time. 3. Atrophy of the left kidney again noted. 4. Aortic atherosclerosis. 5. Additional incidental findings, as above. 6. Normal appendix.    HISTORY OF PRESENT ILLNESS Paula Watson is an 71 y.o. female Caucasian right-handed female with PMH of CVA in 2007, left CEA, basilar artery aneurysm, hyperlipidemia, hypertension, diabetes mellitus, coronary artery disease, and CKD presented with sudden onset inability to lift the left arm, left facial droop and severe slurred speech while watching a game around 7 PM on the evening of admission. Her symptoms lasted for about 5 minutes and had completely resolved by the time patient arrived to the emergency room. She was being evaluated for TIA when around 9:15 PM family noted her symptoms have returned and notified the nurse. She was stroke alerted at 920 and patient was brought to the [place]. Her blood pressure was 440 systolic. CT head showed no acute infarct/hemorrhage with aspects of 10.  On Dr. Arrie Eastern initial assessment immediately following her CT scan, the patient's symptoms had improved and she no longer had a left arm drift or slurred speech. She scored NIHSS of 1 for mild  facial droop. Given her rapidly improving and not disabling symptoms, we decided not to  administer TPA with plan for close monitoring if patient became worse.  Shortly after, when patient had returned back to her room in the ER- she started slurring her speech and was noted to have a left pronator drift by the ER physician. Due to worsening of her symptoms and concern that she would return to her initial presentation of left hemiplegia and severe dysarthria which was disabling, we decided to administer TPA. The risks versus benefit of administrating TPA with low NIH assess of 3 with disabling symptoms was explained to the family.    Date last known well: 11. .20.18 Time last known well: 9.15 p.m. tPA Given: Administered 03/03/2017 NIHSS: 3 Baseline MRS 0    HOSPITAL COURSE Ms. Paula Watson is a 71 y.o. female with history of CKD, CAD, DM, HLD, HTN, polil, TIA, BA tip aneurysm admitted for recurrent left arm and facial weakness, slurry speech. TPA given 03/03/2017. The patient was subsequently admitted to the neuro intensive care unit for close monitoring and further treatment as indicated.  Stroke:  bilateral MCA/ACA and MCA/PCA four punctate infarcts - could be due to high grade stenosis of right ICA and occluded left subclavian to CCA bypass with left ICA 50-60% stenosis. However, cardioemoblic can not be completely ruled out due to episodes of heart palpitation at home.   Resultant back to baseline  MRI 4 punctate foci of acute subcortical ischemia within both frontal lobes, the right parietal lobe and left temporal lobe  MRA  50mm BA tip aneurysm, stable  CTA head and neck high grade stenosis of right ICA and occluded left subclavian to CCA bypass with left ICA > 75% stenosis. Stable BA aneurysm.  Cerebral Angiogram - Occluded previous Lt subclavian to Lt CCA graft. 90 % plus RT ICA prox stenosis. 50 to  60 % stenosis of Lt ICA cavernous seg stenosis  2D Echo EF 60-65%  VVS consulted for right CEA (see below)  LDL 83  HgbA1c 6.4  SCDs for VTE  prophylaxis  Diet Carb Modified Fluid consistency: Thin; Room service appropriate? Yes   aspirin 81 mg daily prior to admission, now on ASA and plavix dual antiplatelet.   Patient counseled to be compliant with her antithrombotic medications  Ongoing aggressive stroke risk factor management  Therapy recommendations:  HH PT and OT  Disposition:  Discharge to Home  Carotid stenosis bilateral  CUS 10/2005 right ICA patent  CUS 08/2014 right ICA 50-69% stenosis but could be underestimating due to plaque  Right ICA 90% stenosis on CTA this time.  Left subclavian to CCA bypass in 2000  10/2005 underwent left subclavian artery angioplasty due to stenosis  08/2014 CUS showed bypass was patent with low resistance antegrade flow   Current CTA showed bypass closed.  Cerebral angio Occluded previous Lt subclavian to Lt CCA graft. 90 % plus RT ICA prox stenosis. 50 to 60 % stenosis of Lt ICA cavernous seg stenosis  Heart palpitation  More frequent for the last one month, especially at night  Followed with PCP and put on coreg and getting improved  Has cardiology referral in [place] on 03/13/17  Recommend outpt follow up with cardiology to consider loop recorder for arrhythmia evaluation given current stroke. Pt understood that she needs to request medical record sent to her cardiologist to facilitate the process. I can not find her cardiologist appointment via Epic system at this time.  Hx of TIA  02/2011 transient right arm weakness - MRI neg, CUS showed right ICA 40-59% stenosis, EF 60-65% - ASA changed to plavix  Hx of BA tip aneurysm  MRA showed stable aneurysm  DSA also showed stable 59mm BA aneurysm  Following with Dr. Estanislado Pandy  Diabetes  HgbA1c 6.4 goal < 7.0  Controlled  Currently on SSI  CBG monitoring  SSI  close PCP follow up  Hypertension  Stable, on the high side  Permissive hypertension (OK if <180/105) for 24-48 hours post stroke and then  gradually normalized within 5-7 days.  Coreg resumed, but other home BP meds on hold  Long term BP goal 130-160 now due to b/l carotid disease  Avoid low BP at home  Hyperlipidemia  Home meds:  pravastatin 10  LDL 83, goal < 70  Now on pravastatin 20  Continue statin at discharge  Tobacco abuse  Current smoker  Smoking cessation counseling provided  Pt is willing to quit  Other Stroke Risk Factors  Advanced age  Coronary artery disease  Other Active Problems  CKD stage II, Cre 1.30->1.25->1.20->1.35->1.26  Hx of polio - right foot DF/PF impairment    FLANK PAIN  Renal US - cysts as above  CT Renal Stone Study - 5 mm nonobstructive calculus in the upper pole collecting system of the right kidney.  U/A negative  WBCs - 8.8  VSS - Bp 168/53  Temp - 98.2  Oxycodon (Dr Aroor) ordered for pain.  Discussed with pt's nurse   PLAN  90% Rt ICA stenosis ; Occluded Lt carotid - Per Dr Scot Dock - Rt stenosis is not surgically accessible. Dr. Scot Dock will discuss possible stenting with Dr. Estanislado Pandy.  BA tip aneurysm - following with Dr Estanislado Pandy  Outpatient follow-up with cardiology for possible loop placement  Home health PT and OT  Continue aspirin 325 mg daily and Plavix 75 mg daily.  Continue statin (Pravachol 20 mg daily) (consider high dose statin)  Dr. Patrecia Pour will see outpatine  As discussed with Dr Jaynee Eagles - Oxycodone for renal stone pain (#30 No RF) F/U with her urologist    DISCHARGE EXAM Vitals:   03/08/17 0100 03/08/17 0540 03/08/17 1001 03/08/17 1433  BP: (!) 182/54 (!) 167/60 (!) 202/61 (!) 153/50  Pulse: (!) 56 (!) 58 63 65  Resp: 18 18 14 19   Temp: 98.5 F (36.9 C) 98.9 F (37.2 C) 98.2 F (36.8 C) 98.9 F (37.2 C)  TempSrc: Oral Oral Oral Oral  SpO2: 95% 94% 96% 96%  Weight:      Height:         General - Well nourished, well developed, in no apparent distress.  Ophthalmologic - fundi not visualized due to  noncooperation.  Cardiovascular - Regular rate and rhythm with no murmur.  Mental Status -  Level of arousal and orientation to time, place, and person were intact. Language including expression, naming, repetition, comprehension was assessed and found intact. Fund of Knowledge was assessed and was intact.  Cranial Nerves II - XII - II - Visual field intact OU. III, IV, VI - Extraocular movements intact. V - Facial sensation intact bilaterally. VII - Facial movement intact bilaterally. VIII - Hearing & vestibular intact bilaterally. X - Palate elevates symmetrically. XI - Chin turning & shoulder shrug intact bilaterally. XII - Tongue protrusion intact.  Motor Strength - The patient's strength was normal in all extremities except right foot 3/5 PF and DF due to hx of polio and pronator drift was absent.  Bulk was normal and fasciculations were absent.   Motor Tone - Muscle tone was assessed at the neck and appendages and was normal.  Reflexes - The patient's reflexes were symmetrical in all extremities and she had no pathological reflexes.  Sensory - Light touch, temperature/pinprick were assessed and were symmetrical.    Coordination - The patient had normal movements in the hands with no ataxia or dysmetria.  Tremor was absent.  Gait and Station - deferred.   Discharge instructions to the patient: 1. Home physical and occupational therapists will visit to continue therapy.     A nurse will visit to monitor your blood pressure, pain, blood glucose, and medication requirements. 2. Avoid low blood pressure until blockage in carotid artery has been repaired. 3. If pain progresses contact your primary care provider and for urologist. 4. Follow-up with physicians as recommended. 5. Take medications as prescribed. 6. Drink plenty of fluids. 7. Gradually increase your activity as tolerated.   Discharge Diet   Diet Carb Modified Fluid consistency: Thin; Room service  appropriate? Yes liquids  DISCHARGE PLAN  Disposition:  Discharge to home  aspirin 325 mg daily and clopidogrel 75 mg daily for secondary stroke prevention.  Ongoing risk factor control by Primary Care Physician at time of discharge  Follow-up Terald Sleeper, PA-C in 2 weeks.  Follow-up with Dr. Rosalin [facility] in 6 weeks, office to schedule an appointment.  Follow-up with Cardiology in [place] as scheduled on 03/13/2017  Follow up with Urology or primary care provider for renal calculi  Home health physical and occupational therapies.  Follow-up with Dr. Estanislado Pandy for possible stenting.  40 minutes were spent preparing discharge.  Mikey Bussing PA-C [facility] Pager [phone] 03/08/2017, 2:50 PM

## 2017-03-09 ENCOUNTER — Encounter (HOSPITAL_COMMUNITY): Payer: Self-pay | Admitting: Interventional Radiology

## 2017-03-09 ENCOUNTER — Telehealth: Payer: Self-pay | Admitting: Physician Assistant

## 2017-03-09 HISTORY — PX: IR ANGIO VERTEBRAL SEL SUBCLAVIAN INNOMINATE BILAT MOD SED: IMG5366

## 2017-03-09 LAB — GLUCOSE, CAPILLARY: GLUCOSE-CAPILLARY: 159 mg/dL — AB (ref 65–99)

## 2017-03-09 NOTE — Telephone Encounter (Signed)
appt scheduled Pt notified 

## 2017-03-10 ENCOUNTER — Telehealth (HOSPITAL_COMMUNITY): Payer: Self-pay | Admitting: Radiology

## 2017-03-10 NOTE — Telephone Encounter (Signed)
Called pt to ask her to come to [facility], Radiology Department tomorrow (03/11/17) for a P2Y12 blood test. She stated that she would come tomorrow around lunch time for this test. JM

## 2017-03-11 ENCOUNTER — Other Ambulatory Visit (HOSPITAL_COMMUNITY): Payer: Self-pay | Admitting: Radiology

## 2017-03-11 ENCOUNTER — Encounter: Payer: Self-pay | Admitting: Physician Assistant

## 2017-03-11 ENCOUNTER — Other Ambulatory Visit: Payer: Self-pay | Admitting: Physician Assistant

## 2017-03-11 ENCOUNTER — Ambulatory Visit (INDEPENDENT_AMBULATORY_CARE_PROVIDER_SITE_OTHER): Payer: Medicare Other | Admitting: Physician Assistant

## 2017-03-11 ENCOUNTER — Other Ambulatory Visit: Payer: Self-pay

## 2017-03-11 VITALS — BP 173/70 | HR 61 | Temp 97.2°F | Ht 62.0 in | Wt 152.4 lb

## 2017-03-11 DIAGNOSIS — Z9181 History of falling: Secondary | ICD-10-CM | POA: Insufficient documentation

## 2017-03-11 DIAGNOSIS — R531 Weakness: Secondary | ICD-10-CM | POA: Insufficient documentation

## 2017-03-11 DIAGNOSIS — I6300 Cerebral infarction due to thrombosis of unspecified precerebral artery: Secondary | ICD-10-CM

## 2017-03-11 DIAGNOSIS — I6930 Unspecified sequelae of cerebral infarction: Secondary | ICD-10-CM

## 2017-03-11 DIAGNOSIS — I771 Stricture of artery: Secondary | ICD-10-CM

## 2017-03-11 LAB — PLATELET INHIBITION P2Y12: Platelet Function  P2Y12: 98 [PRU] — ABNORMAL LOW (ref 194–418)

## 2017-03-11 NOTE — Progress Notes (Signed)
BP (!) 173/70   Pulse 61   Temp (!) 97.2 F (36.2 C) (Oral)   Ht 5\' 2"  (1.575 m)   Wt 152 lb 6.4 oz (69.1 kg)   BMI 27.87 kg/m    Subjective:    Patient ID: Paula Watson, female    DOB: 14-Nov-1945, 71 y.o.   MRN: [id number]  HPI: Paula Watson is a 71 y.o. female presenting on 03/11/2017 for Hospitalization Follow-up (stroke)  Face to face encounter for evaluation of home health related to her stroke, left-sided weakness and falls.  She is still going to the cardiologist for dyspnea on exertion.  She will have a gastroenterology appointment.  She is also being followed by interventional radiology.  She will have a neurology appointment also.  All of her notes are available in the epic system.  She had a hospital admission from 03/03/2017 through 03/07/2017.  She has had excellent care and calls from all of her providers and the nursing network for home patients with stroke.  She is a great need of safety in the home and assisted devices such as transfer chairs shower chairs and bedside commode.  I am sending this ordered to [facility] as soon as possible.  Relevant past medical, surgical, family and social history reviewed and updated as indicated. Allergies and medications reviewed and updated.  Past Medical History:  Diagnosis Date  . Anemia   . Aneurysm of artery ([place])   . Blood transfusion   . Bronchitis    "have had a couple of times in my lifetime"  . Cancer ([place]) 1985   cervical  . Cataract   . CKD (chronic kidney disease) stage 1, GFR 90 ml/min or greater was told by her PCP   Intolerant to ACE according to her PCP  . Coronary artery disease   . Diabetes mellitus   . GERD (gastroesophageal reflux disease)   . Hemorrhoids   . High cholesterol   . Hyperlipidemia   . Hypertension   . Polio 1953  . Poliomyelitis since 1948   has drop foot on R  . Stroke [facility]) 2007 affected R side  . Vertigo now improved    Past Surgical History:  Procedure Laterality Date    . CAROTID ENDARTERECTOMY Left 2000 L side  . CERVICAL CONIZATION W/BX  1985  . CORONARY ANGIOPLASTY  stented 2007  . DILATION AND CURETTAGE OF UTERUS  1980's  . foot reconstructjion  1953   right foot; post polio  . IR ANGIO EXTERNAL CAROTID SEL EXT CAROTID UNI R MOD SED  03/06/2017  . IR ANGIO VERTEBRAL SEL SUBCLAVIAN INNOMINATE BILAT MOD SED  03/09/2017  . IR ANGIO VERTEBRAL SEL VERTEBRAL BILAT MOD SED  03/06/2017  . IR RADIOLOGIST EVAL & MGMT  07/21/2016  . TONSILLECTOMY  1954    Review of Systems  Constitutional: Positive for fatigue. Negative for activity change and fever.  HENT: Negative.   Eyes: Negative.   Respiratory: Positive for shortness of breath. Negative for cough and wheezing.   Cardiovascular: Negative.  Negative for chest pain and leg swelling.  Gastrointestinal: Negative.  Negative for abdominal pain.  Endocrine: Negative.   Genitourinary: Negative.  Negative for dysuria.  Musculoskeletal: Positive for gait problem, joint swelling and myalgias.  Skin: Negative.   Neurological: Positive for dizziness, weakness and light-headedness. Negative for tremors, seizures, syncope, facial asymmetry, speech difficulty, numbness and headaches.  Hematological: Negative.   Psychiatric/Behavioral: The patient is nervous/anxious.     Allergies as  of 03/11/2017      Reactions   Penicillins Hives, Swelling   Has patient had a PCN reaction causing immediate rash, facial/tongue/throat swelling, SOB or lightheadedness with hypotension: Yes Has patient had a PCN reaction causing severe rash involving mucus membranes or skin necrosis: Unknown Has patient had a PCN reaction that required hospitalization: No Has patient had a PCN reaction occurring within the last 10 years: No If all of the above answers are "NO", then may proceed with Cephalosporin use.      Medication List        Accurate as of 03/11/17 11:48 AM. Always use your most recent med list.          albuterol 108  (90 Base) MCG/ACT inhaler Commonly known as:  PROVENTIL HFA;VENTOLIN HFA Inhale 2 puffs into the lungs every 6 (six) hours as needed for wheezing or shortness of breath.   aspirin 325 MG EC tablet Take 1 tablet (325 mg total) by mouth daily.   bisacodyl 5 MG EC tablet Commonly known as:  DULCOLAX Take 1 tablet (5 mg total) by mouth daily as needed for moderate constipation.   carvedilol 6.25 MG tablet Commonly known as:  COREG Take 1 tablet (6.25 mg total) 2 (two) times daily with a meal by mouth.   clopidogrel 75 MG tablet Commonly known as:  PLAVIX Take 1 tablet (75 mg total) by mouth daily.   clotrimazole-betamethasone lotion Commonly known as:  LOTRISONE APPLY TOPICALLY TWO TIMES DAILY   famotidine 20 MG tablet Commonly known as:  PEPCID Take 2 tablets (40 mg total) by mouth daily.   fluticasone 50 MCG/ACT nasal spray Commonly known as:  FLONASE Place 1 spray into both nostrils 2 (two) times daily.   hydrocortisone 2.5 % rectal cream Commonly known as:  ANUSOL-HC Place 1 application rectally 2 (two) times daily.   metFORMIN 500 MG 24 hr tablet Commonly known as:  GLUCOPHAGE-XR Take 1 tablet (500 mg total) by mouth 2 (two) times daily.   mupirocin cream 2 % Commonly known as:  BACTROBAN Apply 1 application topically 2 (two) times daily.   oxyCODONE 5 MG immediate release tablet Commonly known as:  Oxy IR/ROXICODONE Take 1 tablet (5 mg total) by mouth every 6 (six) hours as needed for moderate pain.   pravastatin 20 MG tablet Commonly known as:  PRAVACHOL Take 1 tablet (20 mg total) by mouth daily.          Objective:    BP (!) 173/70   Pulse 61   Temp (!) 97.2 F (36.2 C) (Oral)   Ht 5\' 2"  (1.575 m)   Wt 152 lb 6.4 oz (69.1 kg)   BMI 27.87 kg/m   Allergies  Allergen Reactions  . Penicillins Hives and Swelling    Has patient had a PCN reaction causing immediate rash, facial/tongue/throat swelling, SOB or lightheadedness with hypotension: Yes Has  patient had a PCN reaction causing severe rash involving mucus membranes or skin necrosis: Unknown Has patient had a PCN reaction that required hospitalization: No Has patient had a PCN reaction occurring within the last 10 years: No If all of the above answers are "NO", then may proceed with Cephalosporin use.     Physical Exam  Constitutional: She is oriented to person, place, and time. She appears well-developed and well-nourished.  HENT:  Head: Normocephalic and atraumatic.  Eyes: Conjunctivae and EOM are normal. Pupils are equal, round, and reactive to light.  Cardiovascular: Normal rate, regular rhythm, normal heart sounds  and intact distal pulses.  Pulmonary/Chest: Effort normal and breath sounds normal.  Abdominal: Soft. Bowel sounds are normal.  Neurological: She is alert and oriented to person, place, and time. She has normal reflexes.  Skin: Skin is warm and dry. No rash noted.  Psychiatric: She has a normal mood and affect. Her behavior is normal. Judgment and thought content normal.  Nursing note and vitals reviewed.   Results for orders placed or performed during the hospital encounter of 03/03/17  MRSA PCR Screening  Result Value Ref Range   MRSA by PCR NEGATIVE NEGATIVE  Protime-INR  Result Value Ref Range   Prothrombin Time 12.3 11.4 - 15.2 seconds   INR 0.92   APTT  Result Value Ref Range   aPTT 31 24 - 36 seconds  CBC  Result Value Ref Range   WBC 10.0 4.0 - 10.5 K/uL   RBC 4.91 3.87 - 5.11 MIL/uL   Hemoglobin 13.9 12.0 - 15.0 g/dL   HCT 42.1 36.0 - 46.0 %   MCV 85.7 78.0 - 100.0 fL   MCH 28.3 26.0 - 34.0 pg   MCHC 33.0 30.0 - 36.0 g/dL   RDW 13.6 11.5 - 15.5 %   Platelets 265 150 - 400 K/uL  Differential  Result Value Ref Range   Neutrophils Relative % 54 %   Neutro Abs 5.4 1.7 - 7.7 K/uL   Lymphocytes Relative 34 %   Lymphs Abs 3.5 0.7 - 4.0 K/uL   Monocytes Relative 8 %   Monocytes Absolute 0.8 0.1 - 1.0 K/uL   Eosinophils Relative 4 %    Eosinophils Absolute 0.4 0.0 - 0.7 K/uL   Basophils Relative 0 %   Basophils Absolute 0.0 0.0 - 0.1 K/uL  Comprehensive metabolic panel  Result Value Ref Range   Sodium 137 135 - 145 mmol/L   Potassium 4.0 3.5 - 5.1 mmol/L   Chloride 108 101 - 111 mmol/L   CO2 22 22 - 32 mmol/L   Glucose, Bld 126 (H) 65 - 99 mg/dL   BUN 20 6 - 20 mg/dL   Creatinine, Ser 1.35 (H) 0.44 - 1.00 mg/dL   Calcium 9.2 8.9 - 10.3 mg/dL   Total Protein 6.9 6.5 - 8.1 g/dL   Albumin 4.0 3.5 - 5.0 g/dL   AST 21 15 - 41 U/L   ALT 14 14 - 54 U/L   Alkaline Phosphatase 71 38 - 126 U/L   Total Bilirubin 0.7 0.3 - 1.2 mg/dL   GFR calc non Af Amer 38 (L) >60 mL/min   GFR calc Af Amer 45 (L) >60 mL/min   Anion gap 7 5 - 15  Hemoglobin A1c  Result Value Ref Range   Hgb A1c MFr Bld 6.4 (H) 4.8 - 5.6 %   Mean Plasma Glucose 136.98 mg/dL  Lipid panel  Result Value Ref Range   Cholesterol 152 0 - 200 mg/dL   Triglycerides 139 <150 mg/dL   HDL 41 >40 mg/dL   Total CHOL/HDL Ratio 3.7 RATIO   VLDL 28 0 - 40 mg/dL   LDL Cholesterol 83 0 - 99 mg/dL  CBC  Result Value Ref Range   WBC 9.6 4.0 - 10.5 K/uL   RBC 4.73 3.87 - 5.11 MIL/uL   Hemoglobin 13.3 12.0 - 15.0 g/dL   HCT 40.7 36.0 - 46.0 %   MCV 86.0 78.0 - 100.0 fL   MCH 28.1 26.0 - 34.0 pg   MCHC 32.7 30.0 - 36.0 g/dL   RDW  13.4 11.5 - 15.5 %   Platelets 218 150 - 400 K/uL  Basic metabolic panel  Result Value Ref Range   Sodium 140 135 - 145 mmol/L   Potassium 3.7 3.5 - 5.1 mmol/L   Chloride 112 (H) 101 - 111 mmol/L   CO2 17 (L) 22 - 32 mmol/L   Glucose, Bld 98 65 - 99 mg/dL   BUN 22 (H) 6 - 20 mg/dL   Creatinine, Ser 1.25 (H) 0.44 - 1.00 mg/dL   Calcium 8.7 (L) 8.9 - 10.3 mg/dL   GFR calc non Af Amer 42 (L) >60 mL/min   GFR calc Af Amer 49 (L) >60 mL/min   Anion gap 11 5 - 15  Glucose, capillary  Result Value Ref Range   Glucose-Capillary 95 65 - 99 mg/dL   Comment 1 Notify RN    Comment 2 Document in Chart   Glucose, capillary  Result Value Ref  Range   Glucose-Capillary 130 (H) 65 - 99 mg/dL   Comment 1 Notify RN    Comment 2 Document in Chart   Glucose, capillary  Result Value Ref Range   Glucose-Capillary 147 (H) 65 - 99 mg/dL   Comment 1 Notify RN    Comment 2 Document in Chart   CBC  Result Value Ref Range   WBC 10.6 (H) 4.0 - 10.5 K/uL   RBC 4.59 3.87 - 5.11 MIL/uL   Hemoglobin 13.0 12.0 - 15.0 g/dL   HCT 39.4 36.0 - 46.0 %   MCV 85.8 78.0 - 100.0 fL   MCH 28.3 26.0 - 34.0 pg   MCHC 33.0 30.0 - 36.0 g/dL   RDW 13.4 11.5 - 15.5 %   Platelets 212 150 - 400 K/uL  Basic metabolic panel  Result Value Ref Range   Sodium 139 135 - 145 mmol/L   Potassium 3.4 (L) 3.5 - 5.1 mmol/L   Chloride 109 101 - 111 mmol/L   CO2 23 22 - 32 mmol/L   Glucose, Bld 91 65 - 99 mg/dL   BUN 18 6 - 20 mg/dL   Creatinine, Ser 1.20 (H) 0.44 - 1.00 mg/dL   Calcium 8.8 (L) 8.9 - 10.3 mg/dL   GFR calc non Af Amer 44 (L) >60 mL/min   GFR calc Af Amer 51 (L) >60 mL/min   Anion gap 7 5 - 15  Glucose, capillary  Result Value Ref Range   Glucose-Capillary 189 (H) 65 - 99 mg/dL  Glucose, capillary  Result Value Ref Range   Glucose-Capillary 161 (H) 65 - 99 mg/dL  Glucose, capillary  Result Value Ref Range   Glucose-Capillary 107 (H) 65 - 99 mg/dL  Glucose, capillary  Result Value Ref Range   Glucose-Capillary 128 (H) 65 - 99 mg/dL  CBC  Result Value Ref Range   WBC 9.7 4.0 - 10.5 K/uL   RBC 4.67 3.87 - 5.11 MIL/uL   Hemoglobin 13.2 12.0 - 15.0 g/dL   HCT 40.0 36.0 - 46.0 %   MCV 85.7 78.0 - 100.0 fL   MCH 28.3 26.0 - 34.0 pg   MCHC 33.0 30.0 - 36.0 g/dL   RDW 13.6 11.5 - 15.5 %   Platelets 223 150 - 400 K/uL  Basic metabolic panel  Result Value Ref Range   Sodium 137 135 - 145 mmol/L   Potassium 4.6 3.5 - 5.1 mmol/L   Chloride 109 101 - 111 mmol/L   CO2 20 (L) 22 - 32 mmol/L   Glucose, Bld 116 (H)  65 - 99 mg/dL   BUN 19 6 - 20 mg/dL   Creatinine, Ser 1.35 (H) 0.44 - 1.00 mg/dL   Calcium 8.9 8.9 - 10.3 mg/dL   GFR calc non Af  Amer 38 (L) >60 mL/min   GFR calc Af Amer 45 (L) >60 mL/min   Anion gap 8 5 - 15  Glucose, capillary  Result Value Ref Range   Glucose-Capillary 177 (H) 65 - 99 mg/dL  Glucose, capillary  Result Value Ref Range   Glucose-Capillary 217 (H) 65 - 99 mg/dL   Comment 1 Notify RN    Comment 2 Document in Chart   Glucose, capillary  Result Value Ref Range   Glucose-Capillary 102 (H) 65 - 99 mg/dL   Comment 1 Notify RN    Comment 2 Document in Chart   Glucose, capillary  Result Value Ref Range   Glucose-Capillary 112 (H) 65 - 99 mg/dL  CBC  Result Value Ref Range   WBC 8.8 4.0 - 10.5 K/uL   RBC 4.54 3.87 - 5.11 MIL/uL   Hemoglobin 12.7 12.0 - 15.0 g/dL   HCT 38.6 36.0 - 46.0 %   MCV 85.0 78.0 - 100.0 fL   MCH 28.0 26.0 - 34.0 pg   MCHC 32.9 30.0 - 36.0 g/dL   RDW 13.3 11.5 - 15.5 %   Platelets 204 150 - 400 K/uL  Basic metabolic panel  Result Value Ref Range   Sodium 136 135 - 145 mmol/L   Potassium 4.3 3.5 - 5.1 mmol/L   Chloride 109 101 - 111 mmol/L   CO2 20 (L) 22 - 32 mmol/L   Glucose, Bld 87 65 - 99 mg/dL   BUN 19 6 - 20 mg/dL   Creatinine, Ser 1.26 (H) 0.44 - 1.00 mg/dL   Calcium 8.5 (L) 8.9 - 10.3 mg/dL   GFR calc non Af Amer 42 (L) >60 mL/min   GFR calc Af Amer 48 (L) >60 mL/min   Anion gap 7 5 - 15  Glucose, capillary  Result Value Ref Range   Glucose-Capillary 169 (H) 65 - 99 mg/dL   Comment 1 Notify RN    Comment 2 Document in Chart   Urinalysis, Routine w reflex microscopic  Result Value Ref Range   Color, Urine STRAW (A) YELLOW   APPearance CLEAR CLEAR   Specific Gravity, Urine 1.009 1.005 - 1.030   pH 5.0 5.0 - 8.0   Glucose, UA NEGATIVE NEGATIVE mg/dL   Hgb urine dipstick SMALL (A) NEGATIVE   Bilirubin Urine NEGATIVE NEGATIVE   Ketones, ur NEGATIVE NEGATIVE mg/dL   Protein, ur NEGATIVE NEGATIVE mg/dL   Nitrite NEGATIVE NEGATIVE   Leukocytes, UA NEGATIVE NEGATIVE   RBC / HPF 0-5 0 - 5 RBC/hpf   WBC, UA 0-5 0 - 5 WBC/hpf   Bacteria, UA NONE SEEN  NONE SEEN   Squamous Epithelial / LPF 0-5 (A) NONE SEEN  Glucose, capillary  Result Value Ref Range   Glucose-Capillary 100 (H) 65 - 99 mg/dL   Comment 1 Notify RN    Comment 2 Document in Chart   Glucose, capillary  Result Value Ref Range   Glucose-Capillary 143 (H) 65 - 99 mg/dL  Glucose, capillary  Result Value Ref Range   Glucose-Capillary 101 (H) 65 - 99 mg/dL   Comment 1 Notify RN   CBC  Result Value Ref Range   WBC 7.7 4.0 - 10.5 K/uL   RBC 4.48 3.87 - 5.11 MIL/uL   Hemoglobin 12.6 12.0 -  15.0 g/dL   HCT 38.2 36.0 - 46.0 %   MCV 85.3 78.0 - 100.0 fL   MCH 28.1 26.0 - 34.0 pg   MCHC 33.0 30.0 - 36.0 g/dL   RDW 13.2 11.5 - 15.5 %   Platelets 223 150 - 400 K/uL  Basic metabolic panel  Result Value Ref Range   Sodium 136 135 - 145 mmol/L   Potassium 3.9 3.5 - 5.1 mmol/L   Chloride 107 101 - 111 mmol/L   CO2 21 (L) 22 - 32 mmol/L   Glucose, Bld 76 65 - 99 mg/dL   BUN 19 6 - 20 mg/dL   Creatinine, Ser 1.41 (H) 0.44 - 1.00 mg/dL   Calcium 8.7 (L) 8.9 - 10.3 mg/dL   GFR calc non Af Amer 37 (L) >60 mL/min   GFR calc Af Amer 42 (L) >60 mL/min   Anion gap 8 5 - 15  Glucose, capillary  Result Value Ref Range   Glucose-Capillary 155 (H) 65 - 99 mg/dL   Comment 1 Notify RN    Comment 2 Document in Chart   Glucose, capillary  Result Value Ref Range   Glucose-Capillary 88 65 - 99 mg/dL   Comment 1 Notify RN    Comment 2 Document in Chart   Glucose, capillary  Result Value Ref Range   Glucose-Capillary 128 (H) 65 - 99 mg/dL  Glucose, capillary  Result Value Ref Range   Glucose-Capillary 159 (H) 65 - 99 mg/dL   Comment 1 Notify RN   I-stat troponin, ED  Result Value Ref Range   Troponin i, poc 0.00 0.00 - 0.08 ng/mL   Comment 3          I-Stat Chem 8, ED  Result Value Ref Range   Sodium 140 135 - 145 mmol/L   Potassium 3.9 3.5 - 5.1 mmol/L   Chloride 107 101 - 111 mmol/L   BUN 22 (H) 6 - 20 mg/dL   Creatinine, Ser 1.30 (H) 0.44 - 1.00 mg/dL   Glucose, Bld 125  (H) 65 - 99 mg/dL   Calcium, Ion 1.18 1.15 - 1.40 mmol/L   TCO2 22 22 - 32 mmol/L   Hemoglobin 14.3 12.0 - 15.0 g/dL   HCT 42.0 36.0 - 46.0 %  ECHOCARDIOGRAM COMPLETE  Result Value Ref Range   Weight 2,465.624640001739 oz   Height 62 in   BP 161/60 mmHg      Assessment & Plan:   1. Cerebrovascular accident (CVA) due to thrombosis of precerebral artery [facility]) - Face-to-face encounter (required for Medicare/Medicaid patients)  2. Left-sided weakness - Face-to-face encounter (required for Medicare/Medicaid patients)  3. At high risk for falls - Face-to-face encounter (required for Medicare/Medicaid patients)    Current Outpatient Medications:  .  albuterol (PROVENTIL HFA;VENTOLIN HFA) 108 (90 Base) MCG/ACT inhaler, Inhale 2 puffs into the lungs every 6 (six) hours as needed for wheezing or shortness of breath., Disp: 1 Inhaler, Rfl: 6 .  aspirin EC 325 MG EC tablet, Take 1 tablet (325 mg total) by mouth daily., Disp: 30 tablet, Rfl: 0 .  bisacodyl (DULCOLAX) 5 MG EC tablet, Take 1 tablet (5 mg total) by mouth daily as needed for moderate constipation., Disp: 30 tablet, Rfl: 0 .  carvedilol (COREG) 6.25 MG tablet, Take 1 tablet (6.25 mg total) 2 (two) times daily with a meal by mouth., Disp: 180 tablet, Rfl: 1 .  clopidogrel (PLAVIX) 75 MG tablet, Take 1 tablet (75 mg total) by mouth daily., Disp: 30  tablet, Rfl: 2 .  clotrimazole-betamethasone (LOTRISONE) lotion, APPLY TOPICALLY TWO TIMES DAILY, Disp: 30 mL, Rfl: 0 .  famotidine (PEPCID) 20 MG tablet, Take 2 tablets (40 mg total) by mouth daily. (Patient taking differently: Take 40 mg by mouth every evening. ), Disp: 180 tablet, Rfl: 3 .  fluticasone (FLONASE) 50 MCG/ACT nasal spray, Place 1 spray into both nostrils 2 (two) times daily., Disp: 16 g, Rfl: 11 .  hydrocortisone (ANUSOL-HC) 2.5 % rectal cream, Place 1 application rectally 2 (two) times daily., Disp: 30 g, Rfl: 0 .  metFORMIN (GLUCOPHAGE-XR) 500 MG 24 hr tablet, Take 1  tablet (500 mg total) by mouth 2 (two) times daily., Disp: 180 tablet, Rfl: 3 .  mupirocin cream (BACTROBAN) 2 %, Apply 1 application topically 2 (two) times daily. (Patient taking differently: Apply 1 application topically 2 (two) times daily as needed (pressure callus). ), Disp: 15 g, Rfl: 0 .  oxyCODONE (OXY IR/ROXICODONE) 5 MG immediate release tablet, Take 1 tablet (5 mg total) by mouth every 6 (six) hours as needed for moderate pain., Disp: 30 tablet, Rfl: 0 .  pravastatin (PRAVACHOL) 20 MG tablet, Take 1 tablet (20 mg total) by mouth daily., Disp: 30 tablet, Rfl: 11 Continue all other maintenance medications as listed above.  Follow up plan: Return in about 4 weeks (around 04/08/2017) for recheck.  Educational handout given for [place] PA-C [facility] [address]  [place], [place] [address] [phone]   03/11/2017, 11:48 AM

## 2017-03-11 NOTE — Patient Instructions (Addendum)
In a few days you may receive a survey in the mail or online from [employer] regarding your visit with us today. Please take a moment to fill this out. Your feedback is very important to our whole office. It can help us better understand your needs as well as improve your experience and satisfaction. Thank you for taking your time to complete it. We care about you.  Kanita Delage, PA-C  

## 2017-03-11 NOTE — Patient Outreach (Signed)
[facility]) Care Management  03/11/2017  Paula Watson 1945/08/09 [id number]     EMMI-STROKE RED ON EMMI ALERT Day # 1 Date: 03/10/17 Red Alert Reason: " Scheduled a follow up appt? No   Problems setting up rehab? Yes"   Outreach attempt # 1 to patient.  Spoke with patient. She states she is doing okay since return home. She just took a bath for the first time in over a week and it felt good but also wore her out. She is relying on her dtr to help her with her personal care needs at this time. She voices that she is still weak but feels no where near how she felt after she had a her first stroke several years ago. She is getting ready to go to PCP f/u appt today and then she has an appt at [facility]. She states she has not had a chance yet to make f/u appt with neurology but will do so soon. Her dtr and son are taking her back and forth where she needs to go. She voices some sadness and frustration about her current loss of independence and not being able to drive. She is inquiring how long she has to wait until she can start back driving. Advised patient that this is something she would have to discuss with her MDs. She voices that she has all her meds. She denies any questions/concerns regarding meds. Patient reports no rehab and no HH services were set up for her. No inpatient RN CM note found but PT/OT note did recommend HHPT/OT. Patient will discuss this with MD at appt today to determine if she needs this still. She also plans to ask for a script for [facility]. Otherwise, patient states things are going well. No further RN CM needs or concerns at this time. Advised patient that they would continue to get automated EMMI-Stroke post discharge calls to assess how they are doing following recent hospitalization and will receive a call from a nurse if any of their responses were abnormal. Patient voiced understanding and was appreciative of f/u call.    Plan: RN CM will notify [facility]  administrative assistant of case status.    Enzo Montgomery, RN,BSN,CCM [facility] Telephonic Care Management Coordinator Direct Phone: [phone] Toll Free: [phone] Fax: [phone]

## 2017-03-12 DIAGNOSIS — I69398 Other sequelae of cerebral infarction: Secondary | ICD-10-CM | POA: Diagnosis not present

## 2017-03-12 DIAGNOSIS — I69392 Facial weakness following cerebral infarction: Secondary | ICD-10-CM | POA: Diagnosis not present

## 2017-03-12 DIAGNOSIS — I69328 Other speech and language deficits following cerebral infarction: Secondary | ICD-10-CM | POA: Diagnosis not present

## 2017-03-12 DIAGNOSIS — I69352 Hemiplegia and hemiparesis following cerebral infarction affecting left dominant side: Secondary | ICD-10-CM | POA: Diagnosis not present

## 2017-03-12 DIAGNOSIS — H539 Unspecified visual disturbance: Secondary | ICD-10-CM | POA: Diagnosis not present

## 2017-03-13 ENCOUNTER — Ambulatory Visit: Payer: Medicare Other | Admitting: Cardiovascular Disease

## 2017-03-13 ENCOUNTER — Other Ambulatory Visit: Payer: Self-pay

## 2017-03-13 ENCOUNTER — Telehealth: Payer: Self-pay | Admitting: *Deleted

## 2017-03-13 ENCOUNTER — Encounter: Payer: Self-pay | Admitting: Cardiovascular Disease

## 2017-03-13 ENCOUNTER — Other Ambulatory Visit: Payer: Self-pay | Admitting: Physician Assistant

## 2017-03-13 VITALS — BP 158/74 | HR 98 | Ht 62.0 in | Wt 151.0 lb

## 2017-03-13 DIAGNOSIS — I639 Cerebral infarction, unspecified: Secondary | ICD-10-CM | POA: Diagnosis not present

## 2017-03-13 DIAGNOSIS — I69392 Facial weakness following cerebral infarction: Secondary | ICD-10-CM | POA: Diagnosis not present

## 2017-03-13 DIAGNOSIS — E785 Hyperlipidemia, unspecified: Secondary | ICD-10-CM

## 2017-03-13 DIAGNOSIS — I69328 Other speech and language deficits following cerebral infarction: Secondary | ICD-10-CM | POA: Diagnosis not present

## 2017-03-13 DIAGNOSIS — Z955 Presence of coronary angioplasty implant and graft: Secondary | ICD-10-CM | POA: Diagnosis not present

## 2017-03-13 DIAGNOSIS — R002 Palpitations: Secondary | ICD-10-CM | POA: Diagnosis not present

## 2017-03-13 DIAGNOSIS — I25118 Atherosclerotic heart disease of native coronary artery with other forms of angina pectoris: Secondary | ICD-10-CM

## 2017-03-13 DIAGNOSIS — I10 Essential (primary) hypertension: Secondary | ICD-10-CM

## 2017-03-13 DIAGNOSIS — I69352 Hemiplegia and hemiparesis following cerebral infarction affecting left dominant side: Secondary | ICD-10-CM | POA: Diagnosis not present

## 2017-03-13 DIAGNOSIS — R5383 Other fatigue: Secondary | ICD-10-CM | POA: Diagnosis not present

## 2017-03-13 DIAGNOSIS — I6521 Occlusion and stenosis of right carotid artery: Secondary | ICD-10-CM | POA: Diagnosis not present

## 2017-03-13 DIAGNOSIS — I69398 Other sequelae of cerebral infarction: Secondary | ICD-10-CM | POA: Diagnosis not present

## 2017-03-13 DIAGNOSIS — H539 Unspecified visual disturbance: Secondary | ICD-10-CM | POA: Diagnosis not present

## 2017-03-13 DIAGNOSIS — Z9289 Personal history of other medical treatment: Secondary | ICD-10-CM | POA: Diagnosis not present

## 2017-03-13 MED ORDER — CARVEDILOL 12.5 MG PO TABS: 12.5000 mg | ORAL_TABLET | Freq: Two times a day (BID) | ORAL | 2 refills | Status: DC

## 2017-03-13 NOTE — Telephone Encounter (Signed)
Physical therapy calls to report patient has elevated blood pressure.   Sitting number is 192/70 with walking causing number to elevate to 210/70.    Call was referred to Ms Ronnald Ramp [facility].   She advised to increase Coreg dose from 6.25 mg twice daily , up to 12.5 mg twice daily and report back to her with new readings.

## 2017-03-13 NOTE — Progress Notes (Addendum)
CARDIOLOGY CONSULT NOTE  Patient ID: Paula Watson MRN: [id number] DOB/AGE: 71-Aug-1947 8 y.o.  Admit date: (Not on file) Primary Physician: Terald Sleeper, PA-C Referring Physician: Terald Sleeper, PA-C   Reason for Consultation: Coronary artery disease, palpitations  HPI: Paula Watson is a 71 y.o. female who is being seen today for the evaluation of coronary artery disease and palpitations at the request of Terald Sleeper, PA-C.   As per the electronic medical record, she has a history of coronary artery disease with stenting in 2007.  She also has a history of stroke, hypertension, hyperlipidemia, and left subclavian to carotid bypass and left carotid endarterectomy in 2000.  She was found to have 90% right carotid artery stenosis and saw vascular surgery who recommended stenting.  MRI of the brain, head, and neck on 03/04/17 also demonstrated chronic occlusion of the left common carotid artery.  Echocardiogram demonstrated normal left ventricular systolic and diastolic function with normal regional wall motion, LVEF 60-65%, with moderate LVH.  MRI of the brain on 03/04/17 showed multiple punctate foci of acute subcortical ischemia within both frontal lobes, the right parietal lobe and left temporal lobe.  The distribution of lesions was suggestive of a cardiac or aortic embolic source.  She is referred today by her PCP for palpitations.  ECG which I personally interpreted performed on 03/03/17 demonstrated sinus rhythm with diffuse nonspecific ST segment and T wave abnormalities.  I reviewed telemetry strips while hospitalized which did not demonstrate any arrhythmias.  She has been having palpitations for several months prior to her stroke but since then they have been very seldom.  She denies exertional chest pain.  She has been more easily fatigued for the past 1 month.  There is no cardiac catheterization report in the electronic medical record regarding her stent  placed in 2007.  Lipids 03/04/17: Total cholesterol 152, try glycerides 139, HDL 41, LDL 83.   Allergies  Allergen Reactions  . Penicillins Hives and Swelling    Has patient had a PCN reaction causing immediate rash, facial/tongue/throat swelling, SOB or lightheadedness with hypotension: Yes Has patient had a PCN reaction causing severe rash involving mucus membranes or skin necrosis: Unknown Has patient had a PCN reaction that required hospitalization: No Has patient had a PCN reaction occurring within the last 10 years: No If all of the above answers are "NO", then may proceed with Cephalosporin use.     Current Outpatient Medications  Medication Sig Dispense Refill  . albuterol (PROVENTIL HFA;VENTOLIN HFA) 108 (90 Base) MCG/ACT inhaler Inhale 2 puffs into the lungs every 6 (six) hours as needed for wheezing or shortness of breath. 1 Inhaler 6  . aspirin EC 325 MG EC tablet Take 1 tablet (325 mg total) by mouth daily. 30 tablet 0  . bisacodyl (DULCOLAX) 5 MG EC tablet Take 1 tablet (5 mg total) by mouth daily as needed for moderate constipation. 30 tablet 0  . carvedilol (COREG) 6.25 MG tablet Take 1 tablet (6.25 mg total) 2 (two) times daily with a meal by mouth. 180 tablet 1  . clopidogrel (PLAVIX) 75 MG tablet Take 1 tablet (75 mg total) by mouth daily. 30 tablet 2  . clotrimazole-betamethasone (LOTRISONE) lotion APPLY TOPICALLY TWO TIMES DAILY 30 mL 0  . famotidine (PEPCID) 20 MG tablet Take 2 tablets (40 mg total) by mouth daily. (Patient taking differently: Take 40 mg by mouth every evening. ) 180 tablet 3  . fluticasone (FLONASE)  50 MCG/ACT nasal spray Place 1 spray into both nostrils 2 (two) times daily. 16 g 11  . hydrocortisone (ANUSOL-HC) 2.5 % rectal cream Place 1 application rectally 2 (two) times daily. 30 g 0  . metFORMIN (GLUCOPHAGE-XR) 500 MG 24 hr tablet Take 1 tablet (500 mg total) by mouth 2 (two) times daily. 180 tablet 3  . mupirocin cream (BACTROBAN) 2 % Apply 1  application topically 2 (two) times daily. (Patient taking differently: Apply 1 application topically 2 (two) times daily as needed (pressure callus). ) 15 g 0  . oxyCODONE (OXY IR/ROXICODONE) 5 MG immediate release tablet Take 1 tablet (5 mg total) by mouth every 6 (six) hours as needed for moderate pain. 30 tablet 0  . pravastatin (PRAVACHOL) 20 MG tablet Take 1 tablet (20 mg total) by mouth daily. 30 tablet 11   No current facility-administered medications for this visit.     Past Medical History:  Diagnosis Date  . Anemia   . Aneurysm of artery ([place])   . Blood transfusion   . Bronchitis    "have had a couple of times in my lifetime"  . Cancer ([place]) 1985   cervical  . Cataract   . CKD (chronic kidney disease) stage 1, GFR 90 ml/min or greater was told by her PCP   Intolerant to ACE according to her PCP  . Coronary artery disease   . Diabetes mellitus   . GERD (gastroesophageal reflux disease)   . Hemorrhoids   . High cholesterol   . Hyperlipidemia   . Hypertension   . Polio 1953  . Poliomyelitis since 1948   has drop foot on R  . Stroke [facility]) 2007 affected R side  . Vertigo now improved    Past Surgical History:  Procedure Laterality Date  . CAROTID ENDARTERECTOMY Left 2000 L side  . CERVICAL CONIZATION W/BX  1985  . CORONARY ANGIOPLASTY  stented 2007  . DILATION AND CURETTAGE OF UTERUS  1980's  . foot reconstructjion  1953   right foot; post polio  . IR ANGIO EXTERNAL CAROTID SEL EXT CAROTID UNI R MOD SED  03/06/2017  . IR ANGIO VERTEBRAL SEL SUBCLAVIAN INNOMINATE BILAT MOD SED  03/09/2017  . IR ANGIO VERTEBRAL SEL VERTEBRAL BILAT MOD SED  03/06/2017  . IR RADIOLOGIST EVAL & MGMT  07/21/2016  . TONSILLECTOMY  1954    Social History   Socioeconomic History  . Marital status: Divorced    Spouse name: Not on file  . Number of children: Not on file  . Years of education: Not on file  . Highest education level: Not on file  Social Needs  . Financial resource  strain: Not on file  . Food insecurity - worry: Not on file  . Food insecurity - inability: Not on file  . Transportation needs - medical: Not on file  . Transportation needs - non-medical: Not on file  Occupational History  . Not on file  Tobacco Use  . Smoking status: Former Smoker    Packs/day: 0.25    Years: 50.00    Pack years: 12.50    Types: Cigarettes    Last attempt to quit: 03/03/2017    Years since quitting: 0.0  . Smokeless tobacco: Never Used  . Tobacco comment: "working hard to quit"  Substance and Sexual Activity  . Alcohol use: No  . Drug use: No  . Sexual activity: No    Birth control/protection: Post-menopausal  Other Topics Concern  . Not on file  Social History Narrative  . Not on file     No family history of premature CAD in 1st degree relatives.  Current Meds  Medication Sig  . albuterol (PROVENTIL HFA;VENTOLIN HFA) 108 (90 Base) MCG/ACT inhaler Inhale 2 puffs into the lungs every 6 (six) hours as needed for wheezing or shortness of breath.  Marland Kitchen aspirin EC 325 MG EC tablet Take 1 tablet (325 mg total) by mouth daily.  . bisacodyl (DULCOLAX) 5 MG EC tablet Take 1 tablet (5 mg total) by mouth daily as needed for moderate constipation.  . carvedilol (COREG) 6.25 MG tablet Take 1 tablet (6.25 mg total) 2 (two) times daily with a meal by mouth.  . clopidogrel (PLAVIX) 75 MG tablet Take 1 tablet (75 mg total) by mouth daily.  . clotrimazole-betamethasone (LOTRISONE) lotion APPLY TOPICALLY TWO TIMES DAILY  . famotidine (PEPCID) 20 MG tablet Take 2 tablets (40 mg total) by mouth daily. (Patient taking differently: Take 40 mg by mouth every evening. )  . fluticasone (FLONASE) 50 MCG/ACT nasal spray Place 1 spray into both nostrils 2 (two) times daily.  . hydrocortisone (ANUSOL-HC) 2.5 % rectal cream Place 1 application rectally 2 (two) times daily.  . metFORMIN (GLUCOPHAGE-XR) 500 MG 24 hr tablet Take 1 tablet (500 mg total) by mouth 2 (two) times daily.  .  mupirocin cream (BACTROBAN) 2 % Apply 1 application topically 2 (two) times daily. (Patient taking differently: Apply 1 application topically 2 (two) times daily as needed (pressure callus). )  . oxyCODONE (OXY IR/ROXICODONE) 5 MG immediate release tablet Take 1 tablet (5 mg total) by mouth every 6 (six) hours as needed for moderate pain.  . pravastatin (PRAVACHOL) 20 MG tablet Take 1 tablet (20 mg total) by mouth daily.      Review of systems complete and found to be negative unless listed above in HPI    Physical exam Blood pressure (!) 158/74, pulse 98, height 5\' 2"  (1.575 m), weight 151 lb (68.5 kg), SpO2 98 %. General: NAD Neck: No JVD, no thyromegaly or thyroid nodule.  Lungs: Clear to auscultation bilaterally with normal respiratory effort. CV: Nondisplaced PMI. Regular rate and rhythm, normal S1/S2, no S3/S4, no murmur.  No peripheral edema.     Abdomen: Soft, nontender, no distention.  Skin: Intact without lesions or rashes.  Neurologic: Alert and oriented x 3.  Psych: Normal affect. Extremities: No clubbing or cyanosis.  HEENT: Normal.   ECG: Most recent ECG reviewed.   Labs: Lab Results  Component Value Date/Time   K 3.9 03/08/2017 02:34 AM   BUN 19 03/08/2017 02:34 AM   BUN 23 02/19/2017 12:28 PM   CREATININE 1.41 (H) 03/08/2017 02:34 AM   ALT 14 03/03/2017 09:11 PM   TSH 4.470 02/19/2017 12:28 PM   HGB 12.6 03/08/2017 02:34 AM   HGB 13.5 02/19/2017 12:28 PM     Lipids: Lab Results  Component Value Date/Time   LDLCALC 83 03/04/2017 04:54 AM   LDLCALC 80 02/19/2017 12:28 PM   CHOL 152 03/04/2017 04:54 AM   CHOL 161 02/19/2017 12:28 PM   TRIG 139 03/04/2017 04:54 AM   HDL 41 03/04/2017 04:54 AM   HDL 48 02/19/2017 12:28 PM        ASSESSMENT AND PLAN:  1.  Palpitations: Given the distribution of subcortical ischemia seen by MRI, atrial fibrillation should be considered as a potential etiology.  However given her severe right internal carotid artery  stenosis, this is the most likely etiology.  She  is planning to undergo right carotid artery stenting in the near future, I will hold off on cardiac monitoring.  I would start with a 30-day event monitor because if atrial fibrillation is detected, but this would forego the need for an implantable loop recorder.  Continue carvedilol at present dose for now.  She is also on aspirin and Plavix.  2.  Coronary artery disease with prior history of stenting: I will try to obtain the cardiac catheterization report from 2007 directly from [facility].  She is currently on aspirin, carvedilol, and pravastatin.  She is also on Plavix for recent neurologic ischemia and severe right internal carotid artery stenosis.  3.  Hypertension: It is elevated but permissible given need for right carotid artery stenting in the near future and recent neurologic event.  4.  Hyperlipidemia: Lipids reviewed above.  She is on pravastatin 20 mg.  I would recommend switching to Lipitor 40 mg.  5.  CVA: Please see discussion of etiology and she is on aspirin, Plavix, and pravastatin.  6.  Severe right internal carotid artery stenosis: She is soon to undergo right carotid artery stenting.  7.  Fatigue: Given her history of coronary artery disease with prior stenting and abnormal resting ECG, stress testing is warranted.  I will postpone doing so until she has recovered from right carotid artery stenting.     Disposition: Follow up in 6 weeks.   Signed: Kate Sable, M.D., F.A.C.C.  ADDENDUM: After contacting the cardiac catheterization laboratory at [facility], it appears she has never had a cardiac catheterization there and thus no history of stenting of the coronary arteries.  03/13/2017, 10:15 AM

## 2017-03-13 NOTE — Telephone Encounter (Signed)
Therapy says blood pressure is currently at 188/62.

## 2017-03-13 NOTE — Patient Instructions (Signed)
Medication Instructions:  Continue all current medications.  Labwork: none  Testing/Procedures: none  Follow-Up: 6 weeks   Any Other Special Instructions Will Be Listed Below (If Applicable).  If you need a refill on your cardiac medications before your next appointment, please call your pharmacy.  

## 2017-03-16 ENCOUNTER — Other Ambulatory Visit (HOSPITAL_COMMUNITY): Payer: Self-pay | Admitting: Interventional Radiology

## 2017-03-16 DIAGNOSIS — I6350 Cerebral infarction due to unspecified occlusion or stenosis of unspecified cerebral artery: Secondary | ICD-10-CM

## 2017-03-16 DIAGNOSIS — I771 Stricture of artery: Secondary | ICD-10-CM

## 2017-03-17 DIAGNOSIS — I69398 Other sequelae of cerebral infarction: Secondary | ICD-10-CM | POA: Diagnosis not present

## 2017-03-17 DIAGNOSIS — I69328 Other speech and language deficits following cerebral infarction: Secondary | ICD-10-CM | POA: Diagnosis not present

## 2017-03-17 DIAGNOSIS — H539 Unspecified visual disturbance: Secondary | ICD-10-CM | POA: Diagnosis not present

## 2017-03-17 DIAGNOSIS — I69392 Facial weakness following cerebral infarction: Secondary | ICD-10-CM | POA: Diagnosis not present

## 2017-03-17 DIAGNOSIS — I69352 Hemiplegia and hemiparesis following cerebral infarction affecting left dominant side: Secondary | ICD-10-CM | POA: Diagnosis not present

## 2017-03-19 ENCOUNTER — Other Ambulatory Visit: Payer: Self-pay | Admitting: Radiology

## 2017-03-19 ENCOUNTER — Other Ambulatory Visit: Payer: Self-pay

## 2017-03-19 DIAGNOSIS — I69398 Other sequelae of cerebral infarction: Secondary | ICD-10-CM | POA: Diagnosis not present

## 2017-03-19 DIAGNOSIS — I69352 Hemiplegia and hemiparesis following cerebral infarction affecting left dominant side: Secondary | ICD-10-CM | POA: Diagnosis not present

## 2017-03-19 DIAGNOSIS — I69392 Facial weakness following cerebral infarction: Secondary | ICD-10-CM | POA: Diagnosis not present

## 2017-03-19 DIAGNOSIS — H539 Unspecified visual disturbance: Secondary | ICD-10-CM | POA: Diagnosis not present

## 2017-03-19 DIAGNOSIS — I69328 Other speech and language deficits following cerebral infarction: Secondary | ICD-10-CM | POA: Diagnosis not present

## 2017-03-19 NOTE — Patient Outreach (Signed)
[facility]) Care Management  03/19/2017  ANJU SERENO [birth year] [id number]       EMMI-STROKE RED ON EMMI ALERT Day # 9 Date: 03/18/17 Red Alert Reason: " Feeling worse overall? Yes"   Outreach attempt # 1 to patient. Spoke with patient. She voices that she has bene having a "slight far off" headache for a few days. She voices that it normally goes away when she tales her ASA in the mornings. She states that she was told by MDs that they anted to keep her BP up until after she goes for stent placement on next Tues. She states HHRN is due to come today and will assess her vital signs. She also states that she had bad indigestion last night and woke up with upset stomach this morning. She attributes it to eating Janine Limbo last evening. She has not yet taken anything for it. RN CM provided some nonpharmacologic measures to assist with the issue. Patient voiced she will try those. She is aware to alert Md if symptoms worsen and/or are unresolved. No further RN CM needs or concerns at this time.         Plan: RN CM will notify [facility] administrative assistant of case status.    Enzo Montgomery, RN,BSN,CCM Between Management Telephonic Care Management Coordinator Direct Phone: [phone] Toll Free: [phone] Fax: [phone]

## 2017-03-23 ENCOUNTER — Other Ambulatory Visit: Payer: Self-pay

## 2017-03-23 ENCOUNTER — Other Ambulatory Visit: Payer: Self-pay | Admitting: General Surgery

## 2017-03-23 ENCOUNTER — Encounter (HOSPITAL_COMMUNITY): Payer: Self-pay | Admitting: *Deleted

## 2017-03-23 ENCOUNTER — Other Ambulatory Visit: Payer: Self-pay | Admitting: Radiology

## 2017-03-23 NOTE — Progress Notes (Signed)
Patient was given pre-op instructions. Verbalized understanding.  Instructed to not take metformin the morning of surgery and to check sugars the morning of srugery and throughout the day today.  Pateint stated that she is working on getting her a new machine to check her sugars.  7 days prior to surgery STOP taking any Aspirin(unless otherwise instructed by your surgeon), Aleve, Naproxen, Ibuprofen, Motrin, Advil, Goody's, BC's, all herbal medications, fish oil, and all vitamins  Referred chart to anesthesia for review  - recent hospital admission

## 2017-03-23 NOTE — Progress Notes (Signed)
Anesthesia Chart Review: SAME DAY WORK-UP.  Patient is a 71 year old female posted for stenting on 03/24/17 by IR Dr. Luanne Bras. Notes indicate that RICA stenting was recommended following recent CVA.   History includes recent former smoker (quit 03/03/17), CAD s/p PCI '07, HTN, hypercholesterolemia, CKD stage I, anemia, DM2, GERD, vertigo (improved), poliomyelitis with right foot drop '48, CVA (with right hemiparesis '07; TIA '12), s/p left carotid-subclavian bypass '00, right ICA aneurysm. - Presented to ED 03/03/17 with dizziness with transient episode of dysarthria, left-sided facial droop and left arm weakness. While in ED developed recurrent symptoms. MRI suggest embolic infarcts (multiple punctate foci of acute subcortical ischemia within both frontal lobes, the right parietal lobe and left temporal lobe. No large territory infarct). tPA was administered. Neurology was consulted (Dr. Rosalin Hawking). Due to recent palpitations, TEE with loop recorder recommended to evaluate for afib. However, CTA showed occluded left subclavian to CCA bypass and right ICA 90% stenosis, so this was thought to more likely explain her CVA. TEE and loop recorder were canceled (instead recommended out-patient evaluation for consideration of loop recorder) and IR was consulted for cerebral angiogram. Vascular surgery also consulted, but did not think the 90% RICA stenosis was surgically accessible and carotid stenting was recommended instead. Patient discharged 03/08/17.    - PCP is Particia Nearing, PA-C with [facility]. Last visit 03/11/17 for hospital follow-up. Previous 02/19/17 visit, patient was referred to cardiology for palpitations/dizziness, neurology for frequent falls, and to GI for loss of appetite with N/V.  - Cardiologist is Dr. Kate Sable, established 03/13/17. He was attempting to get records to clarify her reported CAD history. In regards to CVA/palpitations, he wrote, "Given  the distribution of subcortical ischemia seen by MRI, atrial fibrillation should be considered as a potential etiology.  However given her severe right internal carotid artery stenosis, this is the most likely etiology.  She is planning to undergo right carotid artery stenting in the near future, I will hold off on cardiac monitoring.  I would start with a 30-day event monitor because if atrial fibrillation is detected, but this would forego the need for an implantable loop recorder.  Continue carvedilol at present dose for now.  She is also on aspirin and Plavix."  Meds include albuterol, aspirin 325 mg, Coreg, Plavix, Pepcid, Flonase, Glucophage XR, Nicorette gum, OxyIR, pravastatin.  EKG 03/03/17: NSR, right atrial enlargement, non-specific ST/T wave abnormality. Baseline wanderer. When compared to 02/20/11 tracing, no significant change was found.  Echo 03/04/17: Study Conclusions - Left ventricle: The cavity size was normal. Wall thickness was   increased in a pattern of moderate LVH. Systolic function was   normal. The estimated ejection fraction was in the range of 60%   to 65%. Wall motion was normal; there were no regional wall   motion abnormalities. Left ventricular diastolic function   parameters were normal. - Atrial septum: No defect or patent foramen ovale was identified. Impressions: - No cardiac source of emboli was indentified.  Extracranial carotid/innominate/vetrebral arteriography 03/09/17: IMPRESSION: 1. Interval complete occlusion of the previously positioned left subclavian artery to the left common carotid artery bypass graft. 2. Retrograde opacification of the left external carotid artery and then the left internal carotid artery retrogradely from extensive collaterals arising from the left vertebral artery at the level of the occipital bone, and also the left occipital artery. 3. 50-60% stenosis of the left internal carotid artery distal cavernous segment. 4.  Severe high-grade stenosis of 90%  plus of the right internal carotid artery at the carotid bulb, and also of the right external carotid artery at its origin. Approximately 50% stenosis of the right middle cerebral artery proximal M1 segment. 5. Approximately 40% stenosis of the innominate artery just distal to its origin. 6. 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 PLAN: Findings reviewed with the patient and her daughter.  CTA head/neck 03/04/17 (s/p tPA): IMPRESSION: 1. No intracranial large vessel occlusion.  No acute hemorrhage. 2. Proximal right internal carotid artery stenosis measuring 90% secondary to the presence of predominantly calcified plaque. 3. Chronic occlusion of the left common carotid artery at its origin. There is no flow seen within the left subclavian to ICA graft. The left carotid bifurcation and internal and external carotid arteries do show opacification. There is approximately 75% stenosis of the proximal left internal carotid artery. This measurement is likely underestimating the degree of stenosis, because of the small caliber of the distal ICA. 4. Bilateral moderate to severe atherosclerotic narrowing of the internal carotid arteries at the skullbase. 5. 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  MRI/MRA head/neck 03/03/17: IMPRESSION: 1. Multiple punctate foci of acute subcortical ischemia within both frontal lobes, the right parietal lobe and left temporal lobe. No large territory infarct. The distribution of lesions is suggestive of a cardiac or aortic embolic source. 2. Unchanged moderate to severe multifocal narrowing of the internal carotid arteries, worst at the left clinoid segment. 3. 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 4. Unchanged  loss of flow related enhancement of the distal right vertebral artery. MRA of the neck shows no flow related enhancement along the cervical segments of the right vertebral artery. This may indicate occlusion, but the MRA is degraded by the lack of IV contrast. This could be further characterized with CTA of the neck or ultrasound. 5. Chronic occlusion of the left common carotid artery, status post subclavian to carotid bypass. The cervical portions of the internal carotid arteries are poorly assessed due to lack of IV contrast. This also could be further characterized with CTA of the neck or Ultrasound.  Renal U/S 03/07/17: IMPRESSION: Bilateral renal cysts without sonographic evidence for acute pathology. No evidence for obstruction or hydronephrosis.  She will need updated labs prior to her procedure. Most recent Cr 1.2-1.4 range.  Recent CVA felt related to severe RICA stenosis. Neurology, IR, and vascular surgery all saw during hospitalization. She was recently established with cardiology who is aware of procedure plans. If labs acceptable and otherwise no acute changes then I would anticipate that she could proceed as planned.  George Hugh [facility] - Closed Short Stay Center/Anesthesiology Phone [phone] 03/23/2017 12:45 PM

## 2017-03-24 ENCOUNTER — Ambulatory Visit (HOSPITAL_COMMUNITY)
Admission: RE | Admit: 2017-03-24 | Discharge: 2017-03-24 | Disposition: A | Discharge status: Home / Self Care | Payer: Medicare Other | Source: Ambulatory Visit | Attending: Interventional Radiology | Admitting: Interventional Radiology

## 2017-03-24 ENCOUNTER — Encounter (HOSPITAL_COMMUNITY)
Admission: RE | Disposition: A | Discharge status: Home / Self Care | Payer: Self-pay | Source: Ambulatory Visit | Attending: Interventional Radiology

## 2017-03-24 ENCOUNTER — Ambulatory Visit (HOSPITAL_COMMUNITY): Payer: Medicare Other | Admitting: Vascular Surgery

## 2017-03-24 ENCOUNTER — Encounter (HOSPITAL_COMMUNITY): Payer: Self-pay | Admitting: *Deleted

## 2017-03-24 DIAGNOSIS — J449 Chronic obstructive pulmonary disease, unspecified: Secondary | ICD-10-CM | POA: Diagnosis not present

## 2017-03-24 DIAGNOSIS — Z87891 Personal history of nicotine dependence: Secondary | ICD-10-CM | POA: Diagnosis not present

## 2017-03-24 DIAGNOSIS — N181 Chronic kidney disease, stage 1: Secondary | ICD-10-CM | POA: Diagnosis not present

## 2017-03-24 DIAGNOSIS — Z880 Allergy status to penicillin: Secondary | ICD-10-CM | POA: Insufficient documentation

## 2017-03-24 DIAGNOSIS — T82868A Thrombosis of vascular prosthetic devices, implants and grafts, initial encounter: Secondary | ICD-10-CM | POA: Insufficient documentation

## 2017-03-24 DIAGNOSIS — Z7982 Long term (current) use of aspirin: Secondary | ICD-10-CM | POA: Diagnosis not present

## 2017-03-24 DIAGNOSIS — I771 Stricture of artery: Secondary | ICD-10-CM

## 2017-03-24 DIAGNOSIS — E7800 Pure hypercholesterolemia, unspecified: Secondary | ICD-10-CM | POA: Diagnosis not present

## 2017-03-24 DIAGNOSIS — K219 Gastro-esophageal reflux disease without esophagitis: Secondary | ICD-10-CM | POA: Insufficient documentation

## 2017-03-24 DIAGNOSIS — Z8673 Personal history of transient ischemic attack (TIA), and cerebral infarction without residual deficits: Secondary | ICD-10-CM | POA: Insufficient documentation

## 2017-03-24 DIAGNOSIS — Z7902 Long term (current) use of antithrombotics/antiplatelets: Secondary | ICD-10-CM | POA: Diagnosis not present

## 2017-03-24 DIAGNOSIS — I6501 Occlusion and stenosis of right vertebral artery: Secondary | ICD-10-CM | POA: Diagnosis not present

## 2017-03-24 DIAGNOSIS — Y832 Surgical operation with anastomosis, bypass or graft as the cause of abnormal reaction of the patient, or of later complication, without mention of misadventure at the time of the procedure: Secondary | ICD-10-CM | POA: Insufficient documentation

## 2017-03-24 DIAGNOSIS — I10 Essential (primary) hypertension: Secondary | ICD-10-CM | POA: Diagnosis not present

## 2017-03-24 DIAGNOSIS — Z7951 Long term (current) use of inhaled steroids: Secondary | ICD-10-CM | POA: Insufficient documentation

## 2017-03-24 DIAGNOSIS — I129 Hypertensive chronic kidney disease with stage 1 through stage 4 chronic kidney disease, or unspecified chronic kidney disease: Secondary | ICD-10-CM | POA: Diagnosis not present

## 2017-03-24 DIAGNOSIS — I6350 Cerebral infarction due to unspecified occlusion or stenosis of unspecified cerebral artery: Secondary | ICD-10-CM

## 2017-03-24 DIAGNOSIS — E1122 Type 2 diabetes mellitus with diabetic chronic kidney disease: Secondary | ICD-10-CM | POA: Diagnosis not present

## 2017-03-24 DIAGNOSIS — I2510 Atherosclerotic heart disease of native coronary artery without angina pectoris: Secondary | ICD-10-CM | POA: Diagnosis not present

## 2017-03-24 DIAGNOSIS — I6601 Occlusion and stenosis of right middle cerebral artery: Secondary | ICD-10-CM | POA: Diagnosis not present

## 2017-03-24 DIAGNOSIS — Z7984 Long term (current) use of oral hypoglycemic drugs: Secondary | ICD-10-CM | POA: Diagnosis not present

## 2017-03-24 DIAGNOSIS — I671 Cerebral aneurysm, nonruptured: Secondary | ICD-10-CM | POA: Diagnosis not present

## 2017-03-24 DIAGNOSIS — I6521 Occlusion and stenosis of right carotid artery: Secondary | ICD-10-CM | POA: Insufficient documentation

## 2017-03-24 HISTORY — DX: Dyspnea, unspecified: R06.00

## 2017-03-24 HISTORY — PX: IR ANGIO INTRA EXTRACRAN SEL COM CAROTID INNOMINATE UNI R MOD SED: IMG5359

## 2017-03-24 HISTORY — PX: RADIOLOGY WITH ANESTHESIA: SHX6223

## 2017-03-24 LAB — COMPREHENSIVE METABOLIC PANEL
ALK PHOS: 75 U/L (ref 38–126)
ALT: 10 U/L — ABNORMAL LOW (ref 14–54)
ANION GAP: 8 (ref 5–15)
AST: 18 U/L (ref 15–41)
Albumin: 3.6 g/dL (ref 3.5–5.0)
BILIRUBIN TOTAL: 0.2 mg/dL — AB (ref 0.3–1.2)
BUN: 25 mg/dL — AB (ref 6–20)
CALCIUM: 8.5 mg/dL — AB (ref 8.9–10.3)
CO2: 23 mmol/L (ref 22–32)
Chloride: 106 mmol/L (ref 101–111)
Creatinine, Ser: 1.5 mg/dL — ABNORMAL HIGH (ref 0.44–1.00)
GFR calc Af Amer: 39 mL/min — ABNORMAL LOW (ref >60)
GFR, EST NON AFRICAN AMERICAN: 34 mL/min — AB (ref >60)
Glucose, Bld: 152 mg/dL — ABNORMAL HIGH (ref 65–99)
POTASSIUM: 3.6 mmol/L (ref 3.5–5.1)
Sodium: 137 mmol/L (ref 135–145)
TOTAL PROTEIN: 6.4 g/dL — AB (ref 6.5–8.1)

## 2017-03-24 LAB — PROTIME-INR
INR: 0.97
Prothrombin Time: 12.8 seconds (ref 11.4–15.2)

## 2017-03-24 LAB — CBC WITH DIFFERENTIAL/PLATELET
Basophils Absolute: 0 10*3/uL (ref 0.0–0.1)
Basophils Relative: 1 %
Eosinophils Absolute: 0.4 10*3/uL (ref 0.0–0.7)
Eosinophils Relative: 5 %
HEMATOCRIT: 41.2 % (ref 36.0–46.0)
HEMOGLOBIN: 13.3 g/dL (ref 12.0–15.0)
LYMPHS ABS: 2.6 10*3/uL (ref 0.7–4.0)
LYMPHS PCT: 33 %
MCH: 28.1 pg (ref 26.0–34.0)
MCHC: 32.3 g/dL (ref 30.0–36.0)
MCV: 86.9 fL (ref 78.0–100.0)
MONO ABS: 0.6 10*3/uL (ref 0.1–1.0)
MONOS PCT: 7 %
NEUTROS ABS: 4.4 10*3/uL (ref 1.7–7.7)
Neutrophils Relative %: 54 %
Platelets: 347 10*3/uL (ref 150–400)
RBC: 4.74 MIL/uL (ref 3.87–5.11)
RDW: 13.6 % (ref 11.5–15.5)
WBC: 8 10*3/uL (ref 4.0–10.5)

## 2017-03-24 LAB — GLUCOSE, CAPILLARY
GLUCOSE-CAPILLARY: 144 mg/dL — AB (ref 65–99)
Glucose-Capillary: 183 mg/dL — ABNORMAL HIGH (ref 65–99)

## 2017-03-24 LAB — POCT ACTIVATED CLOTTING TIME: Activated Clotting Time: 191 seconds

## 2017-03-24 LAB — PLATELET INHIBITION P2Y12: PLATELET FUNCTION P2Y12: 9 [PRU] — AB (ref 194–418)

## 2017-03-24 LAB — APTT: aPTT: 28 seconds (ref 24–36)

## 2017-03-24 SURGERY — IR WITH ANESTHESIA
Anesthesia: Monitor Anesthesia Care

## 2017-03-24 MED ORDER — HYDRALAZINE HCL 20 MG/ML IJ SOLN
5.0000 mg | Freq: Once | INTRAMUSCULAR | Status: AC
  Administered 2017-03-24: 5 mg via INTRAVENOUS

## 2017-03-24 MED ORDER — HEPARIN SODIUM (PORCINE) 1000 UNIT/ML IJ SOLN
INTRAMUSCULAR | Status: DC | PRN
  Administered 2017-03-24: 3000 [IU] via INTRAVENOUS

## 2017-03-24 MED ORDER — MIDAZOLAM HCL 5 MG/5ML IJ SOLN
INTRAMUSCULAR | Status: DC | PRN
  Administered 2017-03-24: 1 mg via INTRAVENOUS
  Administered 2017-03-24 (×2): 0.5 mg via INTRAVENOUS

## 2017-03-24 MED ORDER — NITROGLYCERIN 1 MG/10 ML FOR IR/CATH LAB
INTRA_ARTERIAL | Status: AC
  Filled 2017-03-24: qty 10

## 2017-03-24 MED ORDER — NIMODIPINE 30 MG PO CAPS
0.0000 mg | ORAL_CAPSULE | ORAL | Status: AC
  Administered 2017-03-24: 30 mg via ORAL
  Filled 2017-03-24: qty 2

## 2017-03-24 MED ORDER — SODIUM CHLORIDE 0.9 % IV SOLN
INTRAVENOUS | Status: DC
  Administered 2017-03-24: 07:00:00 via INTRAVENOUS

## 2017-03-24 MED ORDER — IOPAMIDOL (ISOVUE-300) INJECTION 61%
INTRAVENOUS | Status: AC
  Administered 2017-03-24: 50 mL
  Filled 2017-03-24: qty 150

## 2017-03-24 MED ORDER — ASPIRIN EC 325 MG PO TBEC: 325.0000 mg | DELAYED_RELEASE_TABLET | ORAL | Status: DC

## 2017-03-24 MED ORDER — CLOPIDOGREL BISULFATE 75 MG PO TABS: 75.0000 mg | ORAL_TABLET | ORAL | Status: DC

## 2017-03-24 MED ORDER — VANCOMYCIN HCL 1000 MG IV SOLR
1000.0000 mg | INTRAVENOUS | Status: AC
  Administered 2017-03-24: 1000 mg via INTRAVENOUS
  Filled 2017-03-24: qty 1000

## 2017-03-24 MED ORDER — IOPAMIDOL (ISOVUE-300) INJECTION 61%
INTRAVENOUS | Status: AC
  Filled 2017-03-24: qty 150

## 2017-03-24 MED ORDER — VANCOMYCIN HCL IN DEXTROSE 1-5 GM/200ML-% IV SOLN
INTRAVENOUS | Status: AC
  Filled 2017-03-24: qty 200

## 2017-03-24 MED ORDER — FENTANYL CITRATE (PF) 100 MCG/2ML IJ SOLN
INTRAMUSCULAR | Status: DC | PRN
  Administered 2017-03-24: 50 ug via INTRAVENOUS
  Administered 2017-03-24 (×2): 25 ug via INTRAVENOUS

## 2017-03-24 MED ORDER — HYDRALAZINE HCL 20 MG/ML IJ SOLN
INTRAMUSCULAR | Status: AC
  Filled 2017-03-24: qty 1

## 2017-03-24 MED ORDER — LIDOCAINE HCL (PF) 1 % IJ SOLN
INTRAMUSCULAR | Status: AC | PRN
  Administered 2017-03-24: 8 mL

## 2017-03-24 MED ORDER — EPTIFIBATIDE 20 MG/10ML IV SOLN
INTRAVENOUS | Status: DC
Start: 2017-03-24 — End: 2017-03-24
  Filled 2017-03-24: qty 10

## 2017-03-24 MED ORDER — PROTAMINE SULFATE 10 MG/ML IV SOLN
INTRAVENOUS | Status: DC | PRN
  Administered 2017-03-24: 5 mg via INTRAVENOUS

## 2017-03-24 MED ORDER — LIDOCAINE HCL 1 % IJ SOLN
INTRAMUSCULAR | Status: AC
  Filled 2017-03-24: qty 20

## 2017-03-24 MED ORDER — SODIUM CHLORIDE 0.9 % IV SOLN: INTRAVENOUS | Status: DC

## 2017-03-24 NOTE — Anesthesia Procedure Notes (Signed)
Arterial Line Insertion Start/End12/02/2017 7:30 AM Performed by: Lavell Luster, CRNA, CRNA  Preanesthetic checklist: patient identified, IV checked, site marked, risks and benefits discussed, surgical consent, monitors and equipment checked, pre-op evaluation and timeout performed Lidocaine 1% used for infiltration Left, radial was placed Catheter size: 20 G Hand hygiene performed , maximum sterile barriers used  and Seldinger technique used Allen's test indicative of satisfactory collateral circulation Attempts: 2 Procedure performed without using ultrasound guided technique. Ultrasound Notes:no ultrasound evidence of intravascular and/or intraneural injection Following insertion, Biopatch and dressing applied. Post procedure assessment: normal  Patient tolerated the procedure well with no immediate complications.

## 2017-03-24 NOTE — Procedures (Signed)
S/P RT common carotid arteriogram. Lt  CFA approach. Findings. 1.Compl;ex long segment RT ICA bifurcation soft /calcified  atherosclerotic plaque  With an approx 80 85  % stenosis.. 2.. Mod tortuosity with kinking of junction of prox one third and mid third of RT ICA.

## 2017-03-24 NOTE — Discharge Instructions (Signed)
NO METFORMIN/GLUCOPHAGE FOR 2 DAYS ° ° ° °Femoral Site Care °Refer to this sheet in the next few weeks. These instructions provide you with information about caring for yourself after your procedure. Your health care provider may also give you more specific instructions. Your treatment has been planned according to current medical practices, but problems sometimes occur. Call your health care provider if you have any problems or questions after your procedure. °What can I expect after the procedure? °After your procedure, it is typical to have the following: °· Bruising at the site that usually fades within 1-2 weeks. °· Blood collecting in the tissue (hematoma) that may be painful to the touch. It should usually decrease in size and tenderness within 1-2 weeks. ° °Follow these instructions at home: °· Take medicines only as directed by your health care provider. °· You may shower 24-48 hours after the procedure or as directed by your health care provider. Remove the bandage (dressing) and gently wash the site with plain soap and water. Pat the area dry with a clean towel. Do not rub the site, because this may cause bleeding. °· Do not take baths, swim, or use a hot tub until your health care provider approves. °· Check your insertion site every day for redness, swelling, or drainage. °· Do not apply powder or lotion to the site. °· Limit use of stairs to twice a day for the first 2-3 days or as directed by your health care provider. °· Do not squat for the first 2-3 days or as directed by your health care provider. °· Do not lift over 10 lb (4.5 kg) for 5 days after your procedure or as directed by your health care provider. °· Ask your health care provider when it is okay to: °? Return to work or school. °? Resume usual physical activities or sports. °? Resume sexual activity. °· Do not drive home if you are discharged the same day as the procedure. Have someone else drive you. °· You may drive 24 hours after the  procedure unless otherwise instructed by your health care provider. °· Do not operate machinery or power tools for 24 hours after the procedure or as directed by your health care provider. °· If your procedure was done as an outpatient procedure, which means that you went home the same day as your procedure, a responsible adult should be with you for the first 24 hours after you arrive home. °· Keep all follow-up visits as directed by your health care provider. This is important. °Contact a health care provider if: °· You have a fever. °· You have chills. °· You have increased bleeding from the site. Hold pressure on the site. °Get help right away if: °· You have unusual pain at the site. °· You have redness, warmth, or swelling at the site. °· You have drainage (other than a small amount of blood on the dressing) from the site. °· The site is bleeding, and the bleeding does not stop after 30 minutes of holding steady pressure on the site. °· Your leg or foot becomes pale, cool, tingly, or numb. °This information is not intended to replace advice given to you by your health care provider. Make sure you discuss any questions you have with your health care provider. °Document Released: 12/02/2013 Document Revised: 09/06/2015 Document Reviewed: 10/18/2013 °[employer]vier Interactive Patient Education © 2018 [employer] °Moderate Conscious Sedation, Adult, Care After °These instructions provide you with information about caring for yourself after your procedure. Your   health care provider may also give you more specific instructions. Your treatment has been planned according to current medical practices, but problems sometimes occur. Call your health care provider if you have any problems or questions after your procedure. °What can I expect after the procedure? °After your procedure, it is common: °· To feel sleepy for several hours. °· To feel clumsy and have poor balance for several hours. °· To have poor judgment for  several hours. °· To vomit if you eat too soon. ° °Follow these instructions at home: °For at least 24 hours after the procedure: ° °· Do not: °? Participate in activities where you could fall or become injured. °? Drive. °? Use heavy machinery. °? Drink alcohol. °? Take sleeping pills or medicines that cause drowsiness. °? Make important decisions or sign legal documents. °? Take care of children on your own. °· Rest. °Eating and drinking °· Follow the diet recommended by your health care provider. °· If you vomit: °? Drink water, juice, or soup when you can drink without vomiting. °? Make sure you have little or no nausea before eating solid foods. °General instructions °· Have a responsible adult stay with you until you are awake and alert. °· Take over-the-counter and prescription medicines only as told by your health care provider. °· If you smoke, do not smoke without supervision. °· Keep all follow-up visits as told by your health care provider. This is important. °Contact a health care provider if: °· You keep feeling nauseous or you keep vomiting. °· You feel light-headed. °· You develop a rash. °· You have a fever. °Get help right away if: °· You have trouble breathing. °This information is not intended to replace advice given to you by your health care provider. Make sure you discuss any questions you have with your health care provider. °Document Released: 01/19/2013 Document Revised: 09/03/2015 Document Reviewed: 07/21/2015 °Elsevier Interactive Patient Education © 2018 [employer] ° °

## 2017-03-24 NOTE — Sedation Documentation (Signed)
Pressure still being held

## 2017-03-24 NOTE — Sedation Documentation (Signed)
Pressure still being held, pt tolerating well.

## 2017-03-24 NOTE — Transfer of Care (Signed)
Immediate Anesthesia Transfer of Care Note  Patient: Paula Watson  Procedure(s) Performed: STENTING (N/A )  Patient Location: Short Stay  Anesthesia Type:MAC  Level of Consciousness: awake, alert , oriented and patient cooperative  Airway & Oxygen Therapy: Patient Spontanous Breathing  Post-op Assessment: Report given to RN and Post -op Vital signs reviewed and stable  Post vital signs: Reviewed and stable  Last Vitals:  Vitals:   03/24/17 0612  BP: (!) 194/59  Pulse: (!) 58  Resp: 18  Temp: (!) 36.4 C  SpO2: 98%    Last Pain:  Vitals:   03/24/17 0612  TempSrc: Oral         Complications: No apparent anesthesia complications

## 2017-03-24 NOTE — Sedation Documentation (Signed)
Images reviewed withpt and family members.  Decision has been made to stop for today having just done angiogram.  Dr Estanislado Pandy will discuss findings with Dr Scot Dock to make plan for further treatment.  Pt and family verbalize understanding.

## 2017-03-24 NOTE — Progress Notes (Signed)
Dr Estanislado Pandy notified of b/p and order noted

## 2017-03-24 NOTE — Anesthesia Preprocedure Evaluation (Signed)
Anesthesia Evaluation  Patient identified by MRN, date of birth, ID band Patient awake    Reviewed: Allergy & Precautions, NPO status , Patient's Chart, lab work & pertinent test results  Airway Mallampati: I  TM Distance: >3 FB Neck ROM: Full    Dental   Pulmonary COPD, former smoker,    Pulmonary exam normal        Cardiovascular hypertension, + CAD  Normal cardiovascular exam     Neuro/Psych TIACVA    GI/Hepatic GERD  Controlled,  Endo/Other  diabetes  Renal/GU      Musculoskeletal   Abdominal   Peds  Hematology   Anesthesia Other Findings   Reproductive/Obstetrics                             Anesthesia Physical Anesthesia Plan  ASA: III  Anesthesia Plan: General   Post-op Pain Management:    Induction: Intravenous  PONV Risk Score and Plan: 3 and Treatment may vary due to age or medical condition, Ondansetron and Dexamethasone  Airway Management Planned: Oral ETT  Additional Equipment:   Intra-op Plan:   Post-operative Plan: Extubation in OR  Informed Consent: I have reviewed the patients History and Physical, chart, labs and discussed the procedure including the risks, benefits and alternatives for the proposed anesthesia with the patient or authorized representative who has indicated his/her understanding and acceptance.     Plan Discussed with: CRNA and Surgeon  Anesthesia Plan Comments:         Anesthesia Quick Evaluation

## 2017-03-24 NOTE — Progress Notes (Signed)
PA notified patient and family had additional questions after procedure this AM.  PA to bedside.  Patient and family asking about "next steps."  Informed patient of Dr. Arlean Hopping plan to discuss case with Dr. Scot Dock.  Patient was instructed to contact her PCP about her hypertension.  All questions were answered.  Patient and family verbalize understanding of care plan at this time.   Brynda Greathouse, MS RD PA-C

## 2017-03-24 NOTE — Sedation Documentation (Addendum)
Left femoral sheath pulled by [facility], RT.  Manual pressure held.  V PAD used.

## 2017-03-24 NOTE — Sedation Documentation (Signed)
ACT 191 

## 2017-03-24 NOTE — Progress Notes (Signed)
V-pad off no bleeding or hematoma

## 2017-03-24 NOTE — Progress Notes (Addendum)
Patient in for arteriogram, however, she is a same day workup.  Labs have been drawn, esp. the P2Y.  Will wait to continue. She has taken her plavix, aspirin, and coreg @ 0515 today. 0642  P2y came back 9.  Spoke with Dr. Estanislado Pandy - to proceed. VSS -- 99%, BP 189/55, resp 18

## 2017-03-24 NOTE — Anesthesia Postprocedure Evaluation (Signed)
Anesthesia Post Note  Patient: Paula Watson  Procedure(s) Performed: STENTING (N/A )     Patient location during evaluation: PACU Anesthesia Type: MAC Level of consciousness: awake and alert Pain management: pain level controlled Vital Signs Assessment: post-procedure vital signs reviewed and stable Respiratory status: spontaneous breathing, nonlabored ventilation, respiratory function stable and patient connected to nasal cannula oxygen Cardiovascular status: stable and blood pressure returned to baseline Postop Assessment: no apparent nausea or vomiting Anesthetic complications: no    Last Vitals:  Vitals:   03/24/17 0612  BP: (!) 194/59  Pulse: (!) 58  Resp: 18  Temp: (!) 36.4 C  SpO2: 98%    Last Pain:  Vitals:   03/24/17 0612  TempSrc: Oral                 Deshay Kirstein DAVID

## 2017-03-24 NOTE — Sedation Documentation (Signed)
Left radial art line d/c'd by CRNA, manual pressure being held.

## 2017-03-24 NOTE — H&P (Signed)
Chief Complaint: Patient was seen in consultation today for Cerebral arteriogram with Rt Internal Carotid Artery angioplasty/stent placement at the request of Dr Lavera Guise  Supervising Physician: Luanne Bras  Patient Status: [facility] - Out-pt  History of Present Illness: Paula Watson is a 71 y.o. female   Hx CVA 2007 Known Hx intracranial aneurysms- followed by Dr Estanislado Pandy No interventrions performed to this point. Followed and medical management - remains stable and asymptomatic  Admission for new CVA 03/03/2017 Most recent arteriogram 03/06/17: Cerebral Angiogram - Dr Estanislado Pandy 03/06/2017 S/P 4 vessel cerebral arteriogram. RT CFA approach. Findings. 1.Occluded previous Lt subclavian to Lt CCA graft. 2.90 % plus RT ICA prox stenosis. 3.Stable 71mm basilar apex and RT PCOM aneurysyms. 4.50 to 60 % stenosis of Lt ICA cavernous seg stenosis. 5.Approx 50 % stenosis of RT MCA M1 seg. Stroke: bilateral MCA/ACA and MCA/PCA four punctate infarcts - could be due to high grade stenosis of right ICA and occluded left subclavian to CCA bypass with left ICA 50-60% stenosis.However, cardioemoblic can not be completely ruled out due to episodes of heart palpitation at home  Scheduled now for cerebral arteriogram with possible angioplasty/stent of R ICA stenosis    Past Medical History:  Diagnosis Date  . Anemia   . Aneurysm of artery ([place])   . Blood transfusion   . Bronchitis    "have had a couple of times in my lifetime"  . Cancer ([place]) 1985   cervical  . Cataract   . CKD (chronic kidney disease) stage 1, GFR 90 ml/min or greater was told by her PCP   Intolerant to ACE according to her PCP  . Coronary artery disease   . Diabetes mellitus    tyle 2 diabetic  . Dyspnea   . GERD (gastroesophageal reflux disease)   . Hemorrhoids   . High cholesterol   . Hyperlipidemia   . Hypertension   . Polio 1953  . Poliomyelitis since 1948   has drop foot on R  . Stroke [facility]) 2007  affected R side  . Vertigo now improved    Past Surgical History:  Procedure Laterality Date  . CAROTID ENDARTERECTOMY Left 2000 L side  . CERVICAL CONIZATION W/BX  1985  . COLONOSCOPY    . CORONARY ANGIOPLASTY  stented 2007  . DILATION AND CURETTAGE OF UTERUS  1980's  . foot reconstructjion  1953   right foot; post polio  . IR ANGIO EXTERNAL CAROTID SEL EXT CAROTID UNI R MOD SED  03/06/2017  . IR ANGIO VERTEBRAL SEL SUBCLAVIAN INNOMINATE BILAT MOD SED  03/09/2017  . IR ANGIO VERTEBRAL SEL VERTEBRAL BILAT MOD SED  03/06/2017  . IR RADIOLOGIST EVAL & MGMT  07/21/2016  . TONSILLECTOMY  1954    Allergies: Penicillins  Medications: Prior to Admission medications   Medication Sig Start Date End Date Taking? Authorizing Provider  aspirin EC 325 MG EC tablet Take 1 tablet (325 mg total) by mouth daily. 03/09/17  Yes Rinehuls, Early Chars, PA-C  bisacodyl (DULCOLAX) 5 MG EC tablet Take 1 tablet (5 mg total) by mouth daily as needed for moderate constipation. 03/08/17  Yes Rinehuls, Early Chars, PA-C  carvedilol (COREG) 12.5 MG tablet Take 1 tablet (12.5 mg total) by mouth 2 (two) times daily with a meal. 03/13/17  Yes Terald Sleeper, PA-C  clopidogrel (PLAVIX) 75 MG tablet Take 1 tablet (75 mg total) by mouth daily. 03/09/17  Yes Rinehuls, Early Chars, PA-C  clotrimazole-betamethasone (LOTRISONE) lotion APPLY TOPICALLY TWO  TIMES DAILY Patient taking differently: APPLY TOPICALLY TWO TIMES DAILY AS NEEDED FOR SKIN IRRITATION 09/29/16  Yes Terald Sleeper, PA-C  famotidine (PEPCID) 20 MG tablet Take 2 tablets (40 mg total) by mouth daily. Patient taking differently: Take 40 mg by mouth every evening.  06/09/16  Yes Terald Sleeper, PA-C  metFORMIN (GLUCOPHAGE-XR) 500 MG 24 hr tablet Take 1 tablet (500 mg total) by mouth 2 (two) times daily. 06/09/16  Yes Terald Sleeper, PA-C  nicotine polacrilex (NICORETTE) 4 MG gum Take 4 mg by mouth as needed for smoking cessation.   Yes [provider]  oxyCODONE  (OXY IR/ROXICODONE) 5 MG immediate release tablet Take 1 tablet (5 mg total) by mouth every 6 (six) hours as needed for moderate pain. 03/08/17  Yes Rinehuls, Early Chars, PA-C  pravastatin (PRAVACHOL) 20 MG tablet Take 1 tablet (20 mg total) by mouth daily. 03/08/17  Yes Rinehuls, Early Chars, PA-C  albuterol (PROVENTIL HFA;VENTOLIN HFA) 108 (90 Base) MCG/ACT inhaler Inhale 2 puffs into the lungs every 6 (six) hours as needed for wheezing or shortness of breath. 02/04/16   Terald Sleeper, PA-C  fluticasone (FLONASE) 50 MCG/ACT nasal spray Place 1 spray into both nostrils 2 (two) times daily. Patient taking differently: Place 1 spray into both nostrils daily as needed for allergies.  02/04/16   Terald Sleeper, PA-C  hydrocortisone (ANUSOL-HC) 2.5 % rectal cream Place 1 application rectally 2 (two) times daily. Patient taking differently: Place 1 application rectally 2 (two) times daily as needed (IRRITATION).  06/09/16   Terald Sleeper, PA-C  mupirocin cream (BACTROBAN) 2 % Apply 1 application topically 2 (two) times daily. Patient taking differently: Apply 1 application topically 2 (two) times daily as needed (pressure callus).  06/09/16   Terald Sleeper, PA-C     Family History  Problem Relation Age of Onset  . Coronary artery disease Mother   . Heart disease Mother   . Diabetes type II Father   . Cancer Father        brain stem  . Brain cancer Father   . Diabetes Father   . Cancer Sister        breast with recurrance  . Diabetes Sister   . Stroke Sister   . Coronary artery disease Brother   . COPD Maternal Grandmother        breast  . COPD Paternal Grandmother        breast  . Heart disease Brother   . Heart attack Brother   . COPD Other     Social History   Socioeconomic History  . Marital status: Divorced    Spouse name: None  . Number of children: None  . Years of education: None  . Highest education level: None  Social Needs  . Financial resource strain: None  . Food  insecurity - worry: None  . Food insecurity - inability: None  . Transportation needs - medical: None  . Transportation needs - non-medical: None  Occupational History  . None  Tobacco Use  . Smoking status: Former Smoker    Packs/day: 0.25    Years: 50.00    Pack years: 12.50    Types: Cigarettes    Last attempt to quit: 03/03/2017    Years since quitting: 0.0  . Smokeless tobacco: Never Used  . Tobacco comment: "working hard to quit"  Substance and Sexual Activity  . Alcohol use: No  . Drug use: No  . Sexual activity: No  Birth control/protection: Post-menopausal  Other Topics Concern  . None  Social History Narrative  . None    Review of Systems: A 12 point ROS discussed and pertinent positives are indicated in the HPI above.  All other systems are negative.  Review of Systems  Constitutional: Negative for activity change, fatigue and fever.  HENT: Negative for tinnitus and trouble swallowing.   Eyes: Negative for visual disturbance.  Respiratory: Negative for shortness of breath.   Cardiovascular: Negative for chest pain.  Gastrointestinal: Negative for nausea and vomiting.  Musculoskeletal: Negative for gait problem.  Neurological: Positive for dizziness and weakness. Negative for seizures, syncope, facial asymmetry, speech difficulty, numbness and headaches.  Psychiatric/Behavioral: Negative for agitation, behavioral problems and confusion.    Vital Signs: BP (!) 194/59   Pulse (!) 58   Temp (!) 97.5 F (36.4 C) (Oral)   Resp 18   Ht 5\' 2"  (1.575 m)   Wt 151 lb (68.5 kg)   SpO2 98%   BMI 27.62 kg/m   Physical Exam  Constitutional: She is oriented to person, place, and time. She appears well-nourished.  HENT:  Head: Atraumatic.  Eyes: EOM are normal.  Neck: Neck supple.  Cardiovascular: Normal rate, regular rhythm and normal heart sounds.  No murmur heard. Pulmonary/Chest: Effort normal and breath sounds normal. No respiratory distress. She has no  wheezes.  Abdominal: Soft. Bowel sounds are normal. There is no tenderness.  Musculoskeletal: Normal range of motion.  Neurological: She is alert and oriented to person, place, and time. No cranial nerve deficit.  Skin: Skin is warm and dry.  Psychiatric: She has a normal mood and affect. Her behavior is normal. Judgment and thought content normal.  Nursing note reviewed.   Imaging: Ct Angio Head W Or Wo Contrast  Result Date: 03/04/2017 CLINICAL DATA:  24 hours status post tPA. EXAM: CT ANGIOGRAPHY HEAD AND NECK TECHNIQUE: Multidetector CT imaging of the head and neck was performed using the standard protocol during bolus administration of intravenous contrast. Multiplanar CT image reconstructions and MIPs were obtained to evaluate the vascular anatomy. Carotid stenosis measurements (when applicable) are obtained utilizing NASCET criteria, using the distal internal carotid diameter as the denominator. CONTRAST:  76mL ISOVUE-370 IOPAMIDOL (ISOVUE-370) INJECTION 76% COMPARISON:  Brain MRI 03/03/2017 FINDINGS: CTA NECK FINDINGS Aortic arch: There is moderate calcific atherosclerosis of the aortic arch. There is no aneurysm, dissection or hemodynamically significant stenosis of the visualized ascending aorta and aortic arch. The left common carotid artery is occluded at its origin. There is multifocal atherosclerotic calcification within both proximal subclavian arteries without high-grade stenosis. Right carotid system: The origin of the right common carotid artery is widely patent. There is multifocal atherosclerotic plaque throughout the right common carotid artery. Just proximal to the right carotid bifurcation is a large amount of predominantly calcified plaque that results in 90% stenosis of the proximal right internal carotid artery. The distal right ICA is normal. Left carotid system: The left common carotid artery is occluded at its origin. There is a left subclavian 2 internal carotid artery  bypass graft, but no flow is seen within the graft. There is enhancement of the left carotid bifurcation with normal appearance of the external carotid artery. There is a large amount of mixed calcified and noncalcified plaque within the proximal left internal carotid artery that causes a stenosis of approximately 75%. This measurement is deceptive because of the small caliber of the distal left ICA. Vertebral arteries: The vertebral system is left dominant. The  right vertebral artery is diminutive throughout its entire course. The origin of the left vertebral artery is normal. There is multifocal severe stenosis of the V3 and V4 segments of the right vertebral artery, with no significant opacification of the V4 segment. The dominant left vertebral artery is normal along its entire cervical course. There is atherosclerotic calcification within the left V4 segment without hemodynamically significant stenosis. Skeleton: There is no bony spinal canal stenosis. No lytic or blastic lesions. Other neck: The nasopharynx is clear. The oropharynx and hypopharynx are normal. The epiglottis is normal. The supraglottic larynx, glottis and subglottic larynx are normal. No retropharyngeal collection. The parapharyngeal spaces are preserved. The parotid and submandibular glands are normal. No sialolithiasis or salivary ductal dilatation. The thyroid gland is normal. There is no cervical lymphadenopathy. Upper chest: Mild emphysema. Review of the MIP images confirms the above findings CTA HEAD FINDINGS Anterior circulation: --Intracranial internal carotid arteries: There is severe narrowing of the right internal carotid artery at the skullbase, worst at the proximal cavernous and clinoid segments. The left cavernous ICA is also severely narrowed. Unchanged appearance of clinoid segment right ICA 2 mm aneurysm. --Anterior cerebral arteries: Congenitally absent left A1 segment. Normal anterior communicating artery. Distal anterior  cerebral arteries are normal. --Middle cerebral arteries: Normal. --Posterior communicating arteries: Absent bilaterally. Posterior circulation: --Posterior cerebral arteries: Normal. --Superior cerebellar arteries: Normal. --Basilar artery: Unchanged appearance of basilar tip aneurysm. --Anterior inferior cerebellar arteries: Normal. --Posterior inferior cerebellar arteries: Normal. Venous sinuses: As permitted by contrast timing, patent. Anatomic variants: Congenitally absent left anterior cerebral artery A1 segment. Delayed phase: No parenchymal contrast enhancement.  No hemorrhage. Review of the MIP images confirms the above findings IMPRESSION: 1. No intracranial large vessel occlusion.  No acute hemorrhage. 2. Proximal right internal carotid artery stenosis measuring 90% secondary to the presence of predominantly calcified plaque. 3. Chronic occlusion of the left common carotid artery at its origin. There is no flow seen within the left subclavian to ICA graft. The left carotid bifurcation and internal and external carotid arteries do show opacification. There is approximately 75% stenosis of the proximal left internal carotid artery. This measurement is likely underestimating the degree of stenosis, because of the small caliber of the distal ICA. 4. Bilateral moderate to severe atherosclerotic narrowing of the internal carotid arteries at the skullbase. 5. 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 Electronically Signed   By: Ulyses Jarred M.D.   On: 03/04/2017 23:19   Ct Angio Neck W Or Wo Contrast  Result Date: 03/04/2017 CLINICAL DATA:  24 hours status post tPA. EXAM: CT ANGIOGRAPHY HEAD AND NECK TECHNIQUE: Multidetector CT imaging of the head and neck was performed using the standard protocol during bolus administration of intravenous contrast. Multiplanar CT image reconstructions and MIPs were obtained to evaluate the vascular anatomy. Carotid stenosis measurements (when  applicable) are obtained utilizing NASCET criteria, using the distal internal carotid diameter as the denominator. CONTRAST:  59mL ISOVUE-370 IOPAMIDOL (ISOVUE-370) INJECTION 76% COMPARISON:  Brain MRI 03/03/2017 FINDINGS: CTA NECK FINDINGS Aortic arch: There is moderate calcific atherosclerosis of the aortic arch. There is no aneurysm, dissection or hemodynamically significant stenosis of the visualized ascending aorta and aortic arch. The left common carotid artery is occluded at its origin. There is multifocal atherosclerotic calcification within both proximal subclavian arteries without high-grade stenosis. Right carotid system: The origin of the right common carotid artery is widely patent. There is multifocal atherosclerotic plaque throughout the right common carotid artery. Just  proximal to the right carotid bifurcation is a large amount of predominantly calcified plaque that results in 90% stenosis of the proximal right internal carotid artery. The distal right ICA is normal. Left carotid system: The left common carotid artery is occluded at its origin. There is a left subclavian 2 internal carotid artery bypass graft, but no flow is seen within the graft. There is enhancement of the left carotid bifurcation with normal appearance of the external carotid artery. There is a large amount of mixed calcified and noncalcified plaque within the proximal left internal carotid artery that causes a stenosis of approximately 75%. This measurement is deceptive because of the small caliber of the distal left ICA. Vertebral arteries: The vertebral system is left dominant. The right vertebral artery is diminutive throughout its entire course. The origin of the left vertebral artery is normal. There is multifocal severe stenosis of the V3 and V4 segments of the right vertebral artery, with no significant opacification of the V4 segment. The dominant left vertebral artery is normal along its entire cervical course. There is  atherosclerotic calcification within the left V4 segment without hemodynamically significant stenosis. Skeleton: There is no bony spinal canal stenosis. No lytic or blastic lesions. Other neck: The nasopharynx is clear. The oropharynx and hypopharynx are normal. The epiglottis is normal. The supraglottic larynx, glottis and subglottic larynx are normal. No retropharyngeal collection. The parapharyngeal spaces are preserved. The parotid and submandibular glands are normal. No sialolithiasis or salivary ductal dilatation. The thyroid gland is normal. There is no cervical lymphadenopathy. Upper chest: Mild emphysema. Review of the MIP images confirms the above findings CTA HEAD FINDINGS Anterior circulation: --Intracranial internal carotid arteries: There is severe narrowing of the right internal carotid artery at the skullbase, worst at the proximal cavernous and clinoid segments. The left cavernous ICA is also severely narrowed. Unchanged appearance of clinoid segment right ICA 2 mm aneurysm. --Anterior cerebral arteries: Congenitally absent left A1 segment. Normal anterior communicating artery. Distal anterior cerebral arteries are normal. --Middle cerebral arteries: Normal. --Posterior communicating arteries: Absent bilaterally. Posterior circulation: --Posterior cerebral arteries: Normal. --Superior cerebellar arteries: Normal. --Basilar artery: Unchanged appearance of basilar tip aneurysm. --Anterior inferior cerebellar arteries: Normal. --Posterior inferior cerebellar arteries: Normal. Venous sinuses: As permitted by contrast timing, patent. Anatomic variants: Congenitally absent left anterior cerebral artery A1 segment. Delayed phase: No parenchymal contrast enhancement.  No hemorrhage. Review of the MIP images confirms the above findings IMPRESSION: 1. No intracranial large vessel occlusion.  No acute hemorrhage. 2. Proximal right internal carotid artery stenosis measuring 90% secondary to the presence of  predominantly calcified plaque. 3. Chronic occlusion of the left common carotid artery at its origin. There is no flow seen within the left subclavian to ICA graft. The left carotid bifurcation and internal and external carotid arteries do show opacification. There is approximately 75% stenosis of the proximal left internal carotid artery. This measurement is likely underestimating the degree of stenosis, because of the small caliber of the distal ICA. 4. Bilateral moderate to severe atherosclerotic narrowing of the internal carotid arteries at the skullbase. 5. 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 Electronically Signed   By: Ulyses Jarred M.D.   On: 03/04/2017 23:19   Mr Jodene Nam Head Wo Contrast  Result Date: 03/04/2017 CLINICAL DATA:  Difficulty speaking. Facial droop. Left arm weakness. EXAM: MRI HEAD WITHOUT CONTRAST MRA HEAD WITHOUT CONTRAST MRA NECK WITHOUT CONTRAST TECHNIQUE: Multiplanar, multiecho pulse sequences of the brain and surrounding structures were  obtained without intravenous contrast. Angiographic images of the Circle of Willis were obtained using MRA technique without intravenous contrast. Angiographic images of the neck were obtained using MRA technique without intravenous contrast. Carotid stenosis measurements (when applicable) are obtained utilizing NASCET criteria, using the distal internal carotid diameter as the denominator. COMPARISON:  Head CT 03/03/2017 Brain MRI 07/11/2016 FINDINGS: MRI HEAD FINDINGS Brain: The midline structures are normal. There are multiple bilateral punctate foci of abnormal diffusion restriction in subcortical locations of both frontal lobes, the right parietal lobe and the posterior left temporal lobe. There is no large territory infarct. There are multiple old bilateral cerebellar and basal ganglia lacunar infarcts. The there is multifocal white matter hyperintensity suggesting chronic ischemic microangiopathy. No mass lesion. No  chronic microhemorrhage or cerebral amyloid angiopathy. No hydrocephalus, age advanced atrophy or lobar predominant volume loss. No dural abnormality or extra-axial collection. Skull and upper cervical spine: The visualized skull base, calvarium, upper cervical spine and extracranial soft tissues are normal. Sinuses/Orbits: No fluid levels or advanced mucosal thickening. No mastoid effusion. Normal orbits. MRA HEAD FINDINGS Intracranial internal carotid arteries: There is multifocal moderate to severe narrowing of the internal carotid arteries, worst at the left clinoid segment. This is unchanged. 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 Anterior cerebral arteries: Congenitally absent left A1 segment. Normal anterior communicating artery and A2 segments. Middle cerebral arteries: Mild distal atherosclerotic irregularity. No occlusion or high-grade stenosis. Posterior communicating arteries: Absent bilaterally. Posterior cerebral arteries: Normal. Basilar artery: There is an anteriorly projecting outpouching from the tip of the basilar artery that measures 2 mm at its base and 3 mm base to apex. This is unchanged. Vertebral arteries: Left dominant. No flow related enhancement is seen in the right vertebral artery, unchanged. Superior cerebellar arteries: Normal. Anterior inferior cerebellar arteries: Not clearly seen, which is not uncommon. Posterior inferior cerebellar arteries: Poorly visualized right PICA. Normal left. MRA NECK FINDINGS Noncontrast MRA of the neck with moderate artifact obscuring details. Right carotid system: Normal course and caliber without stenosis or evidence of dissection. Left carotid system: There is chronic occlusion of the left common carotid artery. The patient is status post subclavian to parotid bypass graft, which is not adequately visualized on this study. Vertebral arteries: The left vertebral artery is normal. There is little to no flow related enhancement  seen within the right vertebral artery. IMPRESSION: 1. Multiple punctate foci of acute subcortical ischemia within both frontal lobes, the right parietal lobe and left temporal lobe. No large territory infarct. The distribution of lesions is suggestive of a cardiac or aortic embolic source. 2. Unchanged moderate to severe multifocal narrowing of the internal carotid arteries, worst at the left clinoid segment. 3. 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 4. Unchanged loss of flow related enhancement of the distal right vertebral artery. MRA of the neck shows no flow related enhancement along the cervical segments of the right vertebral artery. This may indicate occlusion, but the MRA is degraded by the lack of IV contrast. This could be further characterized with CTA of the neck or ultrasound. 5. Chronic occlusion of the left common carotid artery, status post subclavian to carotid bypass. The cervical portions of the internal carotid arteries are poorly assessed due to lack of IV contrast. This also could be further characterized with CTA of the neck or ultrasound. Electronically Signed   By: Ulyses Jarred M.D.   On: 03/04/2017  00:47   Mr Jodene Nam Neck Wo Contrast  Result Date: 03/04/2017 CLINICAL DATA:  Difficulty speaking. Facial droop. Left arm weakness. EXAM: MRI HEAD WITHOUT CONTRAST MRA HEAD WITHOUT CONTRAST MRA NECK WITHOUT CONTRAST TECHNIQUE: Multiplanar, multiecho pulse sequences of the brain and surrounding structures were obtained without intravenous contrast. Angiographic images of the Circle of Willis were obtained using MRA technique without intravenous contrast. Angiographic images of the neck were obtained using MRA technique without intravenous contrast. Carotid stenosis measurements (when applicable) are obtained utilizing NASCET criteria, using the distal internal carotid diameter as the denominator. COMPARISON:  Head  CT 03/03/2017 Brain MRI 07/11/2016 FINDINGS: MRI HEAD FINDINGS Brain: The midline structures are normal. There are multiple bilateral punctate foci of abnormal diffusion restriction in subcortical locations of both frontal lobes, the right parietal lobe and the posterior left temporal lobe. There is no large territory infarct. There are multiple old bilateral cerebellar and basal ganglia lacunar infarcts. The there is multifocal white matter hyperintensity suggesting chronic ischemic microangiopathy. No mass lesion. No chronic microhemorrhage or cerebral amyloid angiopathy. No hydrocephalus, age advanced atrophy or lobar predominant volume loss. No dural abnormality or extra-axial collection. Skull and upper cervical spine: The visualized skull base, calvarium, upper cervical spine and extracranial soft tissues are normal. Sinuses/Orbits: No fluid levels or advanced mucosal thickening. No mastoid effusion. Normal orbits. MRA HEAD FINDINGS Intracranial internal carotid arteries: There is multifocal moderate to severe narrowing of the internal carotid arteries, worst at the left clinoid segment. This is unchanged. 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 Anterior cerebral arteries: Congenitally absent left A1 segment. Normal anterior communicating artery and A2 segments. Middle cerebral arteries: Mild distal atherosclerotic irregularity. No occlusion or high-grade stenosis. Posterior communicating arteries: Absent bilaterally. Posterior cerebral arteries: Normal. Basilar artery: There is an anteriorly projecting outpouching from the tip of the basilar artery that measures 2 mm at its base and 3 mm base to apex. This is unchanged. Vertebral arteries: Left dominant. No flow related enhancement is seen in the right vertebral artery, unchanged. Superior cerebellar arteries: Normal. Anterior inferior cerebellar arteries: Not clearly seen, which is not uncommon. Posterior inferior cerebellar  arteries: Poorly visualized right PICA. Normal left. MRA NECK FINDINGS Noncontrast MRA of the neck with moderate artifact obscuring details. Right carotid system: Normal course and caliber without stenosis or evidence of dissection. Left carotid system: There is chronic occlusion of the left common carotid artery. The patient is status post subclavian to parotid bypass graft, which is not adequately visualized on this study. Vertebral arteries: The left vertebral artery is normal. There is little to no flow related enhancement seen within the right vertebral artery. IMPRESSION: 1. Multiple punctate foci of acute subcortical ischemia within both frontal lobes, the right parietal lobe and left temporal lobe. No large territory infarct. The distribution of lesions is suggestive of a cardiac or aortic embolic source. 2. Unchanged moderate to severe multifocal narrowing of the internal carotid arteries, worst at the left clinoid segment. 3. 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 4. Unchanged loss of flow related enhancement of the distal right vertebral artery. MRA of the neck shows no flow related enhancement along the cervical segments of the right vertebral artery. This may indicate occlusion, but the MRA is degraded by the lack of IV contrast. This could be further characterized with CTA of the neck or ultrasound. 5. Chronic occlusion of the left common carotid artery, status post  subclavian to carotid bypass. The cervical portions of the internal carotid arteries are poorly assessed due to lack of IV contrast. This also could be further characterized with CTA of the neck or ultrasound. Electronically Signed   By: Ulyses Jarred M.D.   On: 03/04/2017 00:47   Mr Brain Wo Contrast  Result Date: 03/04/2017 CLINICAL DATA:  Difficulty speaking. Facial droop. Left arm weakness. EXAM: MRI HEAD WITHOUT CONTRAST MRA HEAD WITHOUT CONTRAST MRA  NECK WITHOUT CONTRAST TECHNIQUE: Multiplanar, multiecho pulse sequences of the brain and surrounding structures were obtained without intravenous contrast. Angiographic images of the Circle of Willis were obtained using MRA technique without intravenous contrast. Angiographic images of the neck were obtained using MRA technique without intravenous contrast. Carotid stenosis measurements (when applicable) are obtained utilizing NASCET criteria, using the distal internal carotid diameter as the denominator. COMPARISON:  Head CT 03/03/2017 Brain MRI 07/11/2016 FINDINGS: MRI HEAD FINDINGS Brain: The midline structures are normal. There are multiple bilateral punctate foci of abnormal diffusion restriction in subcortical locations of both frontal lobes, the right parietal lobe and the posterior left temporal lobe. There is no large territory infarct. There are multiple old bilateral cerebellar and basal ganglia lacunar infarcts. The there is multifocal white matter hyperintensity suggesting chronic ischemic microangiopathy. No mass lesion. No chronic microhemorrhage or cerebral amyloid angiopathy. No hydrocephalus, age advanced atrophy or lobar predominant volume loss. No dural abnormality or extra-axial collection. Skull and upper cervical spine: The visualized skull base, calvarium, upper cervical spine and extracranial soft tissues are normal. Sinuses/Orbits: No fluid levels or advanced mucosal thickening. No mastoid effusion. Normal orbits. MRA HEAD FINDINGS Intracranial internal carotid arteries: There is multifocal moderate to severe narrowing of the internal carotid arteries, worst at the left clinoid segment. This is unchanged. 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 Anterior cerebral arteries: Congenitally absent left A1 segment. Normal anterior communicating artery and A2 segments. Middle cerebral arteries: Mild distal atherosclerotic irregularity. No occlusion or high-grade stenosis.  Posterior communicating arteries: Absent bilaterally. Posterior cerebral arteries: Normal. Basilar artery: There is an anteriorly projecting outpouching from the tip of the basilar artery that measures 2 mm at its base and 3 mm base to apex. This is unchanged. Vertebral arteries: Left dominant. No flow related enhancement is seen in the right vertebral artery, unchanged. Superior cerebellar arteries: Normal. Anterior inferior cerebellar arteries: Not clearly seen, which is not uncommon. Posterior inferior cerebellar arteries: Poorly visualized right PICA. Normal left. MRA NECK FINDINGS Noncontrast MRA of the neck with moderate artifact obscuring details. Right carotid system: Normal course and caliber without stenosis or evidence of dissection. Left carotid system: There is chronic occlusion of the left common carotid artery. The patient is status post subclavian to parotid bypass graft, which is not adequately visualized on this study. Vertebral arteries: The left vertebral artery is normal. There is little to no flow related enhancement seen within the right vertebral artery. IMPRESSION: 1. Multiple punctate foci of acute subcortical ischemia within both frontal lobes, the right parietal lobe and left temporal lobe. No large territory infarct. The distribution of lesions is suggestive of a cardiac or aortic embolic source. 2. Unchanged moderate to severe multifocal narrowing of the internal carotid arteries, worst at the left clinoid segment. 3. 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 4. Unchanged loss of flow related enhancement of the distal right vertebral artery. MRA of the neck shows no flow related enhancement along  the cervical segments of the right vertebral artery. This may indicate occlusion, but the MRA is degraded by the lack of IV contrast. This could be further characterized with CTA of the neck or ultrasound. 5. Chronic  occlusion of the left common carotid artery, status post subclavian to carotid bypass. The cervical portions of the internal carotid arteries are poorly assessed due to lack of IV contrast. This also could be further characterized with CTA of the neck or ultrasound. Electronically Signed   By: Ulyses Jarred M.D.   On: 03/04/2017 00:47   US Renal  Result Date: 03/07/2017 CLINICAL DATA:  71 year old female with bilateral flank/ back pain since last night. EXAM: RENAL / URINARY TRACT ULTRASOUND COMPLETE COMPARISON:  None. FINDINGS: Right Kidney: Length: 10.9 cm. Echogenicity within normal limits. A 2.2 x 1.8 x 2.0 cm cyst is identified at the inter pole. No mass or hydronephrosis visualized. Left Kidney: Length: 9.2 cm. Echogenicity within normal limits. A 3.6 x 2.9 x 4.4 cm cyst is identified at the upper pole. No mass or hydronephrosis visualized. Bladder: Appears normal for degree of bladder distention. IMPRESSION: Bilateral renal cysts without sonographic evidence for acute pathology. No evidence for obstruction or hydronephrosis. Electronically Signed   By: Kristopher Oppenheim M.D.   On: 03/07/2017 16:05   Ct Renal Stone Study  Result Date: 03/08/2017 CLINICAL DATA:  71 year old female with history of low back pain and diffuse abdominal pain with nausea since yesterday. Hematuria. EXAM: CT ABDOMEN AND PELVIS WITHOUT CONTRAST TECHNIQUE: Multidetector CT imaging of the abdomen and pelvis was performed following the standard protocol without IV contrast. COMPARISON:  CT the abdomen and pelvis 06/15/2013. FINDINGS: Lower chest: 3 mm right middle lobe nodule (axial image 15 of series 4), stable dating back to 2015, considered definitively benign. Aortic atherosclerosis. Calcifications of the mitral annulus. Hepatobiliary: No definite suspicious hepatic lesions are identified on today's noncontrast CT examination. Some amorphous intermediate attenuation material and some more focal high attenuation foci are noted  lying dependently in the gallbladder, compatible with a combination of biliary sludge and small gallstones. Gallbladder is normally distended. No pericholecystic fluid to suggest an acute cholecystitis at this time. Pancreas: No definite pancreatic mass or peripancreatic inflammatory changes are noted on today's noncontrast CT examination. Spleen: Unremarkable. Adrenals/Urinary Tract: Multiple low-attenuation lesions in both kidneys, incompletely characterized on today's noncontrast CT examination, but statistically likely to represent cysts, measuring up to 4.6 cm in the upper pole of the left kidney. Other intermediate to high attenuation lesions in both kidneys are also noted, also not characterized, but favored to represent proteinaceous/hemorrhagic cysts. Severe atrophy of the left kidney, similar to the prior study. Calcifications in both renal hila. On the left, these appear to be vascular. On the right, one of these calcifications measuring 5 mm in the upper pole likely represents a nonobstructive calculus. No additional calcifications are noted along the course of either ureter or within the lumen of the urinary bladder. No hydroureteronephrosis. Unenhanced appearance of the urinary bladder is normal. Bilateral adrenal glands are normal in appearance. Stomach/Bowel: Unenhanced appearance of the stomach is normal. There is no pathologic dilatation of small bowel or colon. Normal appendix. Vascular/Lymphatic: Aortic atherosclerosis, without evidence of aneurysm in the abdominal or pelvic vasculature. No lymphadenopathy noted in the abdomen or pelvis. Reproductive: Uterus and ovaries are unremarkable in appearance. Other: No significant volume of ascites.  No pneumoperitoneum. Musculoskeletal: There are no aggressive appearing lytic or blastic lesions noted in the visualized portions of the skeleton.  IMPRESSION: 1. 5 mm nonobstructive calculus in the upper pole collecting system of the right kidney. No  ureteral stones or findings of urinary tract obstruction are noted at this time. 2. There is a combination of biliary sludge and small gallstones in the gallbladder. However, there are no findings to suggest an acute cholecystitis at this time. 3. Atrophy of the left kidney again noted. 4. Aortic atherosclerosis. 5. Additional incidental findings, as above. 6. Normal appendix. Aortic Atherosclerosis (ICD10-I70.0). Electronically Signed   By: Vinnie Langton M.D.   On: 03/08/2017 07:55   Ct Head Code Stroke Wo Contrast  Result Date: 03/03/2017 CLINICAL DATA:  Code stroke. 71 y/o F; left-sided facial droop and arm weakness. EXAM: CT HEAD WITHOUT CONTRAST TECHNIQUE: Contiguous axial images were obtained from the base of the skull through the vertex without intravenous contrast. COMPARISON:  07/11/2016 MRI of the head. FINDINGS: Brain: No evidence of acute infarction, hemorrhage, hydrocephalus, extra-axial collection or mass lesion/mass effect. Stable small chronic infarction within the right cerebellar hemisphere. Stable chronic lacunar infarctions within the right greater than left lentiform nuclei. Stable chronic microvascular ischemic changes of the brain and parenchymal volume loss. Vascular: Extensive calcific atherosclerosis of carotid siphons. No hyperdense vessel. Skull: Normal. Negative for fracture or focal lesion. Sinuses/Orbits: Small right maxillary mucous retention cyst. Otherwise negative. Other: None. ASPECTS [facility] Stroke Program Early CT Score) - Ganglionic level infarction (caudate, lentiform nuclei, internal capsule, insula, M1-M3 cortex): 7 - Supraganglionic infarction (M4-M6 cortex): 3 Total score (0-10 with 10 being normal): 10 IMPRESSION: 1. No acute intracranial abnormality identified. 2. Stable small chronic infarction in right cerebellar hemisphere and chronic lacunar infarcts in basal ganglia. 3. Stable chronic microvascular ischemic changes and parenchymal volume loss of the brain.  4. ASPECTS is 10. These results were text paged at the time of interpretation on 03/03/2017 at 9:47 pm to Dr. Lorraine Lax. Electronically Signed   By: Kristine Garbe M.D.   On: 03/03/2017 21:47   Ir Angio Vertebral Sel Subclavian Innominate Bilat Mod Sed  Result Date: 03/09/2017 CLINICAL DATA:  Vertebrobasilar insufficiency. Right-sided amaurosis fugax, and transient left-sided upper extremity weakness. Abnormal CT angiogram of the head and neck of 03/04/2017. EXAM: IR ANGIO EXTRACRAN SELECT COM CAROTID INNOMINATE UNI*R* MOD SED; IR ANGIO VERTEBRAL SEL VERTEBRAL BILAT MOD SED; LEFT EXTREMITY ARTERIOGRAPHY COMPARISON:  CT angiogram of 03/04/2017. MEDICATIONS: Heparin 1500 units IV; no antibiotic was administered within 1 hour of the procedure. ANESTHESIA/SEDATION: Versed 1  mg IV; Fentanyl 25 mcg IV. Moderate Sedation Time:  35 minutes. The patient was continuously monitored during the procedure by the interventional radiology nurse under my direct supervision. CONTRAST:  Isovue 300 approximately 60 mL. FLUOROSCOPY TIME:  Fluoroscopy Time: 7 minutes 30 seconds (981 mGy). COMPLICATIONS: None immediate. TECHNIQUE: Informed written consent was obtained from the patient after a thorough discussion of the procedural risks, benefits and alternatives. All questions were addressed. Maximal Sterile Barrier Technique was utilized including caps, mask, sterile gowns, sterile gloves, sterile drape, hand hygiene and skin antiseptic. A timeout was performed prior to the initiation of the procedure. The left groin was prepped and draped in the usual sterile fashion. Thereafter using modified Seldinger technique, transfemoral access into the right common femoral artery was obtained without difficulty. Over a 0.035 inch guidewire, a 5 French Pinnacle sheath was inserted. Through this, and also over 0.035 inch guidewire, a 5 [place] JB 1 catheter was advanced to the aortic arch region and selectively positioned in the  innominate artery , the right external  carotid artery, and left subclavian artery. FINDINGS: The left subclavian arteriogram demonstrates mild calcific atherosclerotic disease involving the proximal left subclavian artery without significant stenosis. The origin of the left vertebral artery is widely patent. This is the dominant vertebral artery which ascends normally to the cranial skull base. Wide patency is seen of the left vertebrobasilar junction,, the basilar artery, the posterior cerebral artery, the superior cerebellar arteries and the anterior-inferior cerebellar arteries into the capillary and venous phases. Caliber irregularity of the left posterior-inferior cerebellar artery in the proximal 1/3, the posterior cerebral arteries proximally and distally indicate probable intracranial arteriosclerosis. No change is noted in the 3 mm basilar artery apical aneurysm. Also noted is prompt retrograde opacification via multiple collaterals of the muscular branches of the distal left vertebral artery at the level of occipital and retrogradely of the left occipital artery with subsequent opacification of the left external carotid artery branches and then the left internal carotid artery, which demonstrates a mild stenosis just distal to the bulb by the NASCET criteria. There is then antegrade flow noted in the left internal carotid artery to the cranial skull base. The petrous segment is widely patent. There is mild fusiform prominence of the petrous cavernous segment. There is approximately 50-60% stenosis of the distal cavernous segment of the left internal carotid artery with wide patency of the supraclinoid segment. Free flow is noted into the left middle cerebral artery distribution with mild stenosis noted in the distal left M1 segment. The trifurcation branches are seen to opacify in a delayed manner into the capillary and venous phases. The innominate artery injection demonstrates extensive calcification  involving the proximal portion of the vessel and also the aortic arch diffusely. There is an associated approximately 40% stenosis of the innominate artery just distal to its origin. More distally the origins of the right subclavian artery and the right common carotid artery appear widely patent. The right common carotid arteriogram demonstrates severe stenosis of the right external carotid artery at its origin. Its branches, however, opacify. The right internal carotid artery at the bulb demonstrates a severe focal stenosis of 90% + secondary to partially calcified atherosclerotic plaque. 2-3 focal areas of small ulceration are seen along the medial aspect of the carotid bulb. More distally there is mild tortuosity of the right internal carotid artery at the level of the middle 1/3 and the proximal 1/3 of the right internal carotid artery. More distally the vessel is seen to opacify normally to the cranial skull base. The petrous segment and the proximal cavernous segment are widely patent. There is a focal stenosis of 25 percent of the caval cavernous segment of the right internal carotid artery. The distal cavernous and the supraclinoid segments demonstrate wide patency. The right middle cerebral artery demonstrates an approximately 50% stenosis of the M1 segment proximally. Distal to this there is free flow of contrast into the trifurcation branches and distally. The right anterior cerebral artery is seen to be patent proximally and also into the A2 and A3 segments. Cross opacification via the anterior communicating artery of the left anterior cerebral artery A2 segment and distally is also seen. 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 Patency of the ophthalmic artery is noted. IMPRESSION: Interval complete occlusion of the previously positioned left subclavian artery to the left common carotid artery bypass graft. Retrograde opacification of the left external carotid  artery and then the left internal carotid artery retrogradely from extensive collaterals arising from the  left vertebral artery at the level of the occipital bone, and also the left occipital artery. 50-60% stenosis of the left internal carotid artery distal cavernous segment. Severe high-grade stenosis of 90% plus of the right internal carotid artery at the carotid bulb, and also of the right external carotid artery at its origin. Approximately 50% stenosis of the right middle cerebral artery proximal M1 segment. Approximately 40% stenosis of the innominate artery just distal to its origin. 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 PLAN: Findings reviewed with the patient and her daughter. Electronically Signed   By: Luanne Bras M.D.   On: 03/06/2017 18:40   Ir Angio Vertebral Sel Vertebral Bilat Mod Sed  Result Date: 03/09/2017 CLINICAL DATA:  Vertebrobasilar insufficiency. Right-sided amaurosis fugax, and transient left-sided upper extremity weakness. Abnormal CT angiogram of the head and neck of 03/04/2017. EXAM: IR ANGIO EXTRACRAN SELECT COM CAROTID INNOMINATE UNI*R* MOD SED; IR ANGIO VERTEBRAL SEL VERTEBRAL BILAT MOD SED; LEFT EXTREMITY ARTERIOGRAPHY COMPARISON:  CT angiogram of 03/04/2017. MEDICATIONS: Heparin 1500 units IV; no antibiotic was administered within 1 hour of the procedure. ANESTHESIA/SEDATION: Versed 1  mg IV; Fentanyl 25 mcg IV. Moderate Sedation Time:  35 minutes. The patient was continuously monitored during the procedure by the interventional radiology nurse under my direct supervision. CONTRAST:  Isovue 300 approximately 60 mL. FLUOROSCOPY TIME:  Fluoroscopy Time: 7 minutes 30 seconds (981 mGy). COMPLICATIONS: None immediate. TECHNIQUE: Informed written consent was obtained from the patient after a thorough discussion of the procedural risks, benefits and alternatives. All  questions were addressed. Maximal Sterile Barrier Technique was utilized including caps, mask, sterile gowns, sterile gloves, sterile drape, hand hygiene and skin antiseptic. A timeout was performed prior to the initiation of the procedure. The left groin was prepped and draped in the usual sterile fashion. Thereafter using modified Seldinger technique, transfemoral access into the right common femoral artery was obtained without difficulty. Over a 0.035 inch guidewire, a 5 French Pinnacle sheath was inserted. Through this, and also over 0.035 inch guidewire, a 5 [place] JB 1 catheter was advanced to the aortic arch region and selectively positioned in the innominate artery , the right external carotid artery, and left subclavian artery. FINDINGS: The left subclavian arteriogram demonstrates mild calcific atherosclerotic disease involving the proximal left subclavian artery without significant stenosis. The origin of the left vertebral artery is widely patent. This is the dominant vertebral artery which ascends normally to the cranial skull base. Wide patency is seen of the left vertebrobasilar junction,, the basilar artery, the posterior cerebral artery, the superior cerebellar arteries and the anterior-inferior cerebellar arteries into the capillary and venous phases. Caliber irregularity of the left posterior-inferior cerebellar artery in the proximal 1/3, the posterior cerebral arteries proximally and distally indicate probable intracranial arteriosclerosis. No change is noted in the 3 mm basilar artery apical aneurysm. Also noted is prompt retrograde opacification via multiple collaterals of the muscular branches of the distal left vertebral artery at the level of occipital and retrogradely of the left occipital artery with subsequent opacification of the left external carotid artery branches and then the left internal carotid artery, which demonstrates a mild stenosis just distal to the bulb by the NASCET  criteria. There is then antegrade flow noted in the left internal carotid artery to the cranial skull base. The petrous segment is widely patent. There is mild fusiform prominence of the petrous cavernous segment. There  is approximately 50-60% stenosis of the distal cavernous segment of the left internal carotid artery with wide patency of the supraclinoid segment. Free flow is noted into the left middle cerebral artery distribution with mild stenosis noted in the distal left M1 segment. The trifurcation branches are seen to opacify in a delayed manner into the capillary and venous phases. The innominate artery injection demonstrates extensive calcification involving the proximal portion of the vessel and also the aortic arch diffusely. There is an associated approximately 40% stenosis of the innominate artery just distal to its origin. More distally the origins of the right subclavian artery and the right common carotid artery appear widely patent. The right common carotid arteriogram demonstrates severe stenosis of the right external carotid artery at its origin. Its branches, however, opacify. The right internal carotid artery at the bulb demonstrates a severe focal stenosis of 90% + secondary to partially calcified atherosclerotic plaque. 2-3 focal areas of small ulceration are seen along the medial aspect of the carotid bulb. More distally there is mild tortuosity of the right internal carotid artery at the level of the middle 1/3 and the proximal 1/3 of the right internal carotid artery. More distally the vessel is seen to opacify normally to the cranial skull base. The petrous segment and the proximal cavernous segment are widely patent. There is a focal stenosis of 25 percent of the caval cavernous segment of the right internal carotid artery. The distal cavernous and the supraclinoid segments demonstrate wide patency. The right middle cerebral artery demonstrates an approximately 50% stenosis of the M1  segment proximally. Distal to this there is free flow of contrast into the trifurcation branches and distally. The right anterior cerebral artery is seen to be patent proximally and also into the A2 and A3 segments. Cross opacification via the anterior communicating artery of the left anterior cerebral artery A2 segment and distally is also seen. 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 Patency of the ophthalmic artery is noted. IMPRESSION: Interval complete occlusion of the previously positioned left subclavian artery to the left common carotid artery bypass graft. Retrograde opacification of the left external carotid artery and then the left internal carotid artery retrogradely from extensive collaterals arising from the left vertebral artery at the level of the occipital bone, and also the left occipital artery. 50-60% stenosis of the left internal carotid artery distal cavernous segment. Severe high-grade stenosis of 90% plus of the right internal carotid artery at the carotid bulb, and also of the right external carotid artery at its origin. Approximately 50% stenosis of the right middle cerebral artery proximal M1 segment. Approximately 40% stenosis of the innominate artery just distal to its origin. 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 PLAN: Findings reviewed with the patient and her daughter. Electronically Signed   By: Luanne Bras M.D.   On: 03/06/2017 18:40   Ir Angio External Carotid Sel Ext Carotid Uni R Mod Sed  Result Date: 03/09/2017 CLINICAL DATA:  Vertebrobasilar insufficiency. Right-sided amaurosis fugax, and transient left-sided upper extremity weakness. Abnormal CT angiogram of the head and neck of 03/04/2017. EXAM: IR ANGIO EXTRACRAN SELECT COM CAROTID INNOMINATE UNI*R* MOD SED; IR ANGIO VERTEBRAL SEL VERTEBRAL BILAT MOD SED; LEFT  EXTREMITY ARTERIOGRAPHY COMPARISON:  CT angiogram of 03/04/2017. MEDICATIONS: Heparin 1500 units IV; no antibiotic was administered within 1 hour of the procedure. ANESTHESIA/SEDATION: Versed 1  mg IV; Fentanyl 25 mcg IV. Moderate Sedation Time:  35 minutes. The patient was continuously monitored during the procedure by the interventional radiology nurse under my direct supervision. CONTRAST:  Isovue 300 approximately 60 mL. FLUOROSCOPY TIME:  Fluoroscopy Time: 7 minutes 30 seconds (981 mGy). COMPLICATIONS: None immediate. TECHNIQUE: Informed written consent was obtained from the patient after a thorough discussion of the procedural risks, benefits and alternatives. All questions were addressed. Maximal Sterile Barrier Technique was utilized including caps, mask, sterile gowns, sterile gloves, sterile drape, hand hygiene and skin antiseptic. A timeout was performed prior to the initiation of the procedure. The left groin was prepped and draped in the usual sterile fashion. Thereafter using modified Seldinger technique, transfemoral access into the right common femoral artery was obtained without difficulty. Over a 0.035 inch guidewire, a 5 French Pinnacle sheath was inserted. Through this, and also over 0.035 inch guidewire, a 5 [place] JB 1 catheter was advanced to the aortic arch region and selectively positioned in the innominate artery , the right external carotid artery, and left subclavian artery. FINDINGS: The left subclavian arteriogram demonstrates mild calcific atherosclerotic disease involving the proximal left subclavian artery without significant stenosis. The origin of the left vertebral artery is widely patent. This is the dominant vertebral artery which ascends normally to the cranial skull base. Wide patency is seen of the left vertebrobasilar junction,, the basilar artery, the posterior cerebral artery, the superior cerebellar arteries and the anterior-inferior cerebellar arteries into the capillary  and venous phases. Caliber irregularity of the left posterior-inferior cerebellar artery in the proximal 1/3, the posterior cerebral arteries proximally and distally indicate probable intracranial arteriosclerosis. No change is noted in the 3 mm basilar artery apical aneurysm. Also noted is prompt retrograde opacification via multiple collaterals of the muscular branches of the distal left vertebral artery at the level of occipital and retrogradely of the left occipital artery with subsequent opacification of the left external carotid artery branches and then the left internal carotid artery, which demonstrates a mild stenosis just distal to the bulb by the NASCET criteria. There is then antegrade flow noted in the left internal carotid artery to the cranial skull base. The petrous segment is widely patent. There is mild fusiform prominence of the petrous cavernous segment. There is approximately 50-60% stenosis of the distal cavernous segment of the left internal carotid artery with wide patency of the supraclinoid segment. Free flow is noted into the left middle cerebral artery distribution with mild stenosis noted in the distal left M1 segment. The trifurcation branches are seen to opacify in a delayed manner into the capillary and venous phases. The innominate artery injection demonstrates extensive calcification involving the proximal portion of the vessel and also the aortic arch diffusely. There is an associated approximately 40% stenosis of the innominate artery just distal to its origin. More distally the origins of the right subclavian artery and the right common carotid artery appear widely patent. The right common carotid arteriogram demonstrates severe stenosis of the right external carotid artery at its origin. Its branches, however, opacify. The right internal carotid artery at the bulb demonstrates a severe focal stenosis of 90% + secondary to partially calcified atherosclerotic plaque. 2-3 focal  areas of small ulceration are seen along the medial aspect of the carotid bulb. More distally there is mild tortuosity of the right internal carotid artery at the level of the middle 1/3 and the proximal 1/3 of the right internal carotid artery. More distally the vessel is seen to  opacify normally to the cranial skull base. The petrous segment and the proximal cavernous segment are widely patent. There is a focal stenosis of 25 percent of the caval cavernous segment of the right internal carotid artery. The distal cavernous and the supraclinoid segments demonstrate wide patency. The right middle cerebral artery demonstrates an approximately 50% stenosis of the M1 segment proximally. Distal to this there is free flow of contrast into the trifurcation branches and distally. The right anterior cerebral artery is seen to be patent proximally and also into the A2 and A3 segments. Cross opacification via the anterior communicating artery of the left anterior cerebral artery A2 segment and distally is also seen. 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 Patency of the ophthalmic artery is noted. IMPRESSION: Interval complete occlusion of the previously positioned left subclavian artery to the left common carotid artery bypass graft. Retrograde opacification of the left external carotid artery and then the left internal carotid artery retrogradely from extensive collaterals arising from the left vertebral artery at the level of the occipital bone, and also the left occipital artery. 50-60% stenosis of the left internal carotid artery distal cavernous segment. Severe high-grade stenosis of 90% plus of the right internal carotid artery at the carotid bulb, and also of the right external carotid artery at its origin. Approximately 50% stenosis of the right middle cerebral artery proximal M1 segment. Approximately 40% stenosis of the innominate artery just distal to its origin.  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 PLAN: Findings reviewed with the patient and her daughter. Electronically Signed   By: Luanne Bras M.D.   On: 03/06/2017 18:40    Labs:  CBC: Recent Labs    03/06/17 0407 03/07/17 0250 03/08/17 0234 03/24/17 0555  WBC 9.7 8.8 7.7 8.0  HGB 13.2 12.7 12.6 13.3  HCT 40.0 38.6 38.2 41.2  PLT 223 204 223 347    COAGS: Recent Labs    03/03/17 2111 03/24/17 0555  INR 0.92 0.97  APTT 31 28    BMP: Recent Labs    03/06/17 0407 03/07/17 0250 03/08/17 0234 03/24/17 0555  NA 137 136 136 137  K 4.6 4.3 3.9 3.6  CL 109 109 107 106  CO2 20* 20* 21* 23  GLUCOSE 116* 87 76 152*  BUN 19 19 19  25*  CALCIUM 8.9 8.5* 8.7* 8.5*  CREATININE 1.35* 1.26* 1.41* 1.50*  GFRNONAA 38* 42* 37* 34*  GFRAA 45* 48* 42* 39*    LIVER FUNCTION TESTS: Recent Labs    05/05/16 1516 02/19/17 1228 03/03/17 2111 03/24/17 0555  BILITOT 0.3 0.3 0.7 0.2*  AST 14 17 21 18   ALT 12 8 14  10*  ALKPHOS 84 73 71 75  PROT 6.4 6.0 6.9 6.4*  ALBUMIN 4.1 4.1 4.0 3.6    TUMOR MARKERS: No results for input(s): AFPTM, CEA, CA199, CHROMGRNA in the last 8760 hours.  Assessment and Plan:  Known asymptomatic Intracranial aneurysms- known to Dr Estanislado Pandy New CVA 03/03/17 Cerebral arteriogram revealing R ICA stenosis Now scheduled for cerebral arteriogram with possible angioplasty/stent placement Risks and benefits of cerebral arteriogram were discussed with the patient including, but not limited to bleeding, infection, vascular injury, contrast induced renal failure, stroke or even death. This interventional procedure involves the use of X-rays and because of the nature of the planned procedure, it is possible that we will have  prolonged use of X-ray fluoroscopy. Potential radiation risks to you include (but are not limited to) the following: - A slightly  elevated risk for cancer  several years later in life. This risk is typically less than 0.5% percent. This risk is low in comparison to the normal incidence of human cancer, which is 33% for women and 50% for men according to the [place]. - Radiation induced injury can include skin redness, resembling a rash, tissue breakdown / ulcers and hair loss (which can be temporary or permanent).  The likelihood of either of these occurring depends on the difficulty of the procedure and whether you are sensitive to radiation due to previous procedures, disease, or genetic conditions.  IF your procedure requires a prolonged use of radiation, you will be notified and given written instructions for further action.  It is your responsibility to monitor the irradiated area for the 2 weeks following the procedure and to notify your physician if you are concerned that you have suffered a radiation induced injury.    All of the patient's questions were answered, patient is agreeable to proceed. Consent signed and in chart.  Thank you for this interesting consult.  I greatly enjoyed meeting Tyler S Folz and look forward to participating in their care.  A copy of this report was sent to the requesting provider on this date.  Electronically Signed: Lavonia Drafts, PA-C 03/24/2017, 7:52 AM   I spent a total of  30 Minutes   in face to face in clinical consultation, greater than 50% of which was counseling/coordinating care for cerebral arteriogram with possible intervention

## 2017-03-25 ENCOUNTER — Telehealth: Payer: Self-pay | Admitting: Physician Assistant

## 2017-03-25 ENCOUNTER — Other Ambulatory Visit: Payer: Self-pay | Admitting: Physician Assistant

## 2017-03-25 ENCOUNTER — Encounter (HOSPITAL_COMMUNITY): Payer: Self-pay | Admitting: Interventional Radiology

## 2017-03-25 DIAGNOSIS — I69328 Other speech and language deficits following cerebral infarction: Secondary | ICD-10-CM | POA: Diagnosis not present

## 2017-03-25 DIAGNOSIS — I69352 Hemiplegia and hemiparesis following cerebral infarction affecting left dominant side: Secondary | ICD-10-CM | POA: Diagnosis not present

## 2017-03-25 DIAGNOSIS — H539 Unspecified visual disturbance: Secondary | ICD-10-CM | POA: Diagnosis not present

## 2017-03-25 DIAGNOSIS — I69398 Other sequelae of cerebral infarction: Secondary | ICD-10-CM | POA: Diagnosis not present

## 2017-03-25 DIAGNOSIS — I69392 Facial weakness following cerebral infarction: Secondary | ICD-10-CM | POA: Diagnosis not present

## 2017-03-25 MED ORDER — AMLODIPINE BESYLATE 10 MG PO TABS: 10.0000 mg | ORAL_TABLET | Freq: Every day | ORAL | 3 refills | Status: DC

## 2017-03-25 NOTE — Telephone Encounter (Signed)
Restart amlodipine at 10 mg daily and recheck BP in 2 weeks. I have sent script in.

## 2017-03-25 NOTE — Telephone Encounter (Signed)
Patient aware of recommendations.  

## 2017-03-25 NOTE — Telephone Encounter (Signed)
Patient had apt with Dr. Estanislado Pandy yesterday to get a stent put in the right carotid artery. Patient states they were not able to due to there being too much plaque buildup. Was taken off her amlodipine and lisinopril for the surgery and wanting to know if she can get back on them?  If so please send her in refills. Was told this morning to call our office to see what to do. Please advise

## 2017-03-26 DIAGNOSIS — I69352 Hemiplegia and hemiparesis following cerebral infarction affecting left dominant side: Secondary | ICD-10-CM | POA: Diagnosis not present

## 2017-03-26 DIAGNOSIS — I69328 Other speech and language deficits following cerebral infarction: Secondary | ICD-10-CM | POA: Diagnosis not present

## 2017-03-26 DIAGNOSIS — I69398 Other sequelae of cerebral infarction: Secondary | ICD-10-CM | POA: Diagnosis not present

## 2017-03-26 DIAGNOSIS — I69392 Facial weakness following cerebral infarction: Secondary | ICD-10-CM | POA: Diagnosis not present

## 2017-03-26 DIAGNOSIS — H539 Unspecified visual disturbance: Secondary | ICD-10-CM | POA: Diagnosis not present

## 2017-03-30 DIAGNOSIS — I69352 Hemiplegia and hemiparesis following cerebral infarction affecting left dominant side: Secondary | ICD-10-CM | POA: Diagnosis not present

## 2017-03-30 DIAGNOSIS — I69392 Facial weakness following cerebral infarction: Secondary | ICD-10-CM | POA: Diagnosis not present

## 2017-03-30 DIAGNOSIS — I69398 Other sequelae of cerebral infarction: Secondary | ICD-10-CM | POA: Diagnosis not present

## 2017-03-30 DIAGNOSIS — I69328 Other speech and language deficits following cerebral infarction: Secondary | ICD-10-CM | POA: Diagnosis not present

## 2017-03-30 DIAGNOSIS — H539 Unspecified visual disturbance: Secondary | ICD-10-CM | POA: Diagnosis not present

## 2017-03-31 ENCOUNTER — Encounter: Payer: Self-pay | Admitting: Cardiovascular Disease

## 2017-03-31 ENCOUNTER — Ambulatory Visit (INDEPENDENT_AMBULATORY_CARE_PROVIDER_SITE_OTHER): Payer: Medicare Other

## 2017-03-31 DIAGNOSIS — E1122 Type 2 diabetes mellitus with diabetic chronic kidney disease: Secondary | ICD-10-CM | POA: Diagnosis not present

## 2017-03-31 DIAGNOSIS — N181 Chronic kidney disease, stage 1: Secondary | ICD-10-CM | POA: Diagnosis not present

## 2017-03-31 DIAGNOSIS — H539 Unspecified visual disturbance: Secondary | ICD-10-CM | POA: Diagnosis not present

## 2017-03-31 DIAGNOSIS — I2510 Atherosclerotic heart disease of native coronary artery without angina pectoris: Secondary | ICD-10-CM

## 2017-03-31 DIAGNOSIS — I69352 Hemiplegia and hemiparesis following cerebral infarction affecting left dominant side: Secondary | ICD-10-CM

## 2017-03-31 DIAGNOSIS — I69398 Other sequelae of cerebral infarction: Secondary | ICD-10-CM

## 2017-03-31 DIAGNOSIS — I69392 Facial weakness following cerebral infarction: Secondary | ICD-10-CM | POA: Diagnosis not present

## 2017-03-31 DIAGNOSIS — I69328 Other speech and language deficits following cerebral infarction: Secondary | ICD-10-CM | POA: Diagnosis not present

## 2017-03-31 DIAGNOSIS — G14 Postpolio syndrome: Secondary | ICD-10-CM | POA: Diagnosis not present

## 2017-03-31 DIAGNOSIS — H269 Unspecified cataract: Secondary | ICD-10-CM

## 2017-03-31 DIAGNOSIS — D631 Anemia in chronic kidney disease: Secondary | ICD-10-CM | POA: Diagnosis not present

## 2017-03-31 DIAGNOSIS — I129 Hypertensive chronic kidney disease with stage 1 through stage 4 chronic kidney disease, or unspecified chronic kidney disease: Secondary | ICD-10-CM

## 2017-04-01 ENCOUNTER — Other Ambulatory Visit: Payer: Self-pay

## 2017-04-01 DIAGNOSIS — G459 Transient cerebral ischemic attack, unspecified: Secondary | ICD-10-CM

## 2017-04-01 DIAGNOSIS — I6523 Occlusion and stenosis of bilateral carotid arteries: Secondary | ICD-10-CM

## 2017-04-02 ENCOUNTER — Other Ambulatory Visit: Payer: Self-pay | Admitting: *Deleted

## 2017-04-02 ENCOUNTER — Encounter: Payer: Self-pay | Admitting: *Deleted

## 2017-04-02 ENCOUNTER — Other Ambulatory Visit: Payer: Self-pay

## 2017-04-02 ENCOUNTER — Encounter: Payer: Self-pay | Admitting: Vascular Surgery

## 2017-04-02 ENCOUNTER — Ambulatory Visit: Payer: Medicare Other | Admitting: Vascular Surgery

## 2017-04-02 ENCOUNTER — Ambulatory Visit (HOSPITAL_COMMUNITY)
Admission: RE | Admit: 2017-04-02 | Discharge: 2017-04-02 | Disposition: A | Discharge status: Home / Self Care | Payer: Medicare Other | Source: Ambulatory Visit | Attending: Vascular Surgery | Admitting: Vascular Surgery

## 2017-04-02 VITALS — BP 135/65 | HR 58 | Temp 97.3°F | Resp 14 | Ht 62.0 in | Wt 148.0 lb

## 2017-04-02 DIAGNOSIS — I6523 Occlusion and stenosis of bilateral carotid arteries: Secondary | ICD-10-CM | POA: Diagnosis not present

## 2017-04-02 DIAGNOSIS — I6521 Occlusion and stenosis of right carotid artery: Secondary | ICD-10-CM

## 2017-04-02 DIAGNOSIS — G459 Transient cerebral ischemic attack, unspecified: Secondary | ICD-10-CM

## 2017-04-02 LAB — VAS US CAROTID
LEFT ECA DIAS: -17 cm/s
Left ICA dist dias: -19 cm/s
Left ICA dist sys: -47 cm/s
RCCADSYS: -59 cm/s
RIGHT CCA MID DIAS: 11 cm/s
RIGHT ECA DIAS: 70 cm/s
Right CCA prox dias: 14 cm/s
Right CCA prox sys: 83 cm/s

## 2017-04-02 NOTE — Progress Notes (Signed)
Referring Physician: Gae Gallop MD  Patient name: Paula Watson MRN: [id number] DOB: [birth year] Sex: female  REASON FOR CONSULT: Symptomatic right carotid stenosis  HPI: Paula Watson is a 71 y.o. female who several weeks ago had bilateral cortex stroke left and right frontal right parietal and left temporal.  These had the appearance of embolic event.  The patient was noted to have a greater than 80% right internal carotid artery stenosis.  She has a chronic left common carotid occlusion despite previous subclavian to carotid bypass.  This is now occluded.  She is currently on Plavix and aspirin.  She had a carotid angiogram by Dr. Brock Bad which again confirmed the lesion.  However he was reluctant to proceed apparently due to distal ICA tortuosity and potential soft plaque.  The.  She did have a stroke also in 2007 which resulted with right-sided weakness.  This improved.  She currently has some balance issues with her gait and mild left hand weakness.  This is improving.  She does walk with a limp due to atrophy of her right leg from polio in the past.  Other medical problems include diabetes hyperlipidemia hypertension.  These are all currently controlled.  Past Medical History:  Diagnosis Date  . Anemia   . Aneurysm of artery ([place])   . Blood transfusion   . Bronchitis    "have had a couple of times in my lifetime"  . Cancer ([place]) 1985   cervical  . Cataract   . CKD (chronic kidney disease) stage 1, GFR 90 ml/min or greater was told by her PCP   Intolerant to ACE according to her PCP  . Diabetes mellitus    tyle 2 diabetic  . Dyspnea   . GERD (gastroesophageal reflux disease)   . Hemorrhoids   . High cholesterol   . Hyperlipidemia   . Hypertension   . Polio 1953  . Poliomyelitis since 1948   has drop foot on R  . Stroke [facility]) 2007 affected R side  . Vertigo now improved   Past Surgical History:  Procedure Laterality Date  . CAROTID ENDARTERECTOMY Left 2000 L  side  . CERVICAL CONIZATION W/BX  1985  . COLONOSCOPY    . CORONARY ANGIOPLASTY  stented 2007  . DILATION AND CURETTAGE OF UTERUS  1980's  . foot reconstructjion  1953   right foot; post polio  . IR ANGIO EXTERNAL CAROTID SEL EXT CAROTID UNI R MOD SED  03/06/2017  . IR ANGIO INTRA EXTRACRAN SEL COM CAROTID INNOMINATE UNI R MOD SED  03/24/2017  . IR ANGIO VERTEBRAL SEL SUBCLAVIAN INNOMINATE BILAT MOD SED  03/09/2017  . IR ANGIO VERTEBRAL SEL VERTEBRAL BILAT MOD SED  03/06/2017  . IR RADIOLOGIST EVAL & MGMT  07/21/2016  . RADIOLOGY WITH ANESTHESIA N/A 03/24/2017   Procedure: STENTING;  Surgeon: Luanne Bras, MD;  Location: [facility];  Service: Radiology;  Laterality: N/A;  . TONSILLECTOMY  1954    Family History  Problem Relation Age of Onset  . Coronary artery disease Mother   . Heart disease Mother   . Diabetes type II Father   . Cancer Father        brain stem  . Brain cancer Father   . Diabetes Father   . Cancer Sister        breast with recurrance  . Diabetes Sister   . Stroke Sister   . Coronary artery disease Brother   . COPD Maternal Grandmother  breast  . COPD Paternal Grandmother        breast  . Heart disease Brother   . Heart attack Brother   . COPD Other     SOCIAL HISTORY: Social History   Socioeconomic History  . Marital status: Divorced    Spouse name: Not on file  . Number of children: Not on file  . Years of education: Not on file  . Highest education level: Not on file  Social Needs  . Financial resource strain: Not on file  . Food insecurity - worry: Not on file  . Food insecurity - inability: Not on file  . Transportation needs - medical: Not on file  . Transportation needs - non-medical: Not on file  Occupational History  . Not on file  Tobacco Use  . Smoking status: Former Smoker    Packs/day: 0.25    Years: 50.00    Pack years: 12.50    Types: Cigarettes    Last attempt to quit: 03/03/2017    Years since quitting: 0.0  .  Smokeless tobacco: Never Used  . Tobacco comment: "working hard to quit"  Substance and Sexual Activity  . Alcohol use: No  . Drug use: No  . Sexual activity: No    Birth control/protection: Post-menopausal  Other Topics Concern  . Not on file  Social History Narrative  . Not on file    Allergies  Allergen Reactions  . Penicillins Hives and Swelling    PATIENT HAS HAD A PCN REACTION WITH IMMEDIATE RASH, FACIAL/TONGUE/THROAT SWELLING, SOB, OR LIGHTHEADEDNESS WITH HYPOTENSION:  #  #  #  YES  #  #  #   Has patient had a PCN reaction causing severe rash involving mucus membranes or skin necrosis: Unknown Has patient had a PCN reaction that required hospitalization: No Has patient had a PCN reaction occurring within the last 10 years: No     Current Outpatient Medications  Medication Sig Dispense Refill  . albuterol (PROVENTIL HFA;VENTOLIN HFA) 108 (90 Base) MCG/ACT inhaler Inhale 2 puffs into the lungs every 6 (six) hours as needed for wheezing or shortness of breath. 1 Inhaler 6  . amLODipine (NORVASC) 10 MG tablet Take 1 tablet (10 mg total) by mouth daily. 30 tablet 3  . aspirin EC 325 MG EC tablet Take 1 tablet (325 mg total) by mouth daily. 30 tablet 0  . bisacodyl (DULCOLAX) 5 MG EC tablet Take 1 tablet (5 mg total) by mouth daily as needed for moderate constipation. 30 tablet 0  . carvedilol (COREG) 12.5 MG tablet Take 1 tablet (12.5 mg total) by mouth 2 (two) times daily with a meal. 60 tablet 2  . clopidogrel (PLAVIX) 75 MG tablet Take 1 tablet (75 mg total) by mouth daily. 30 tablet 2  . clotrimazole-betamethasone (LOTRISONE) lotion APPLY TOPICALLY TWO TIMES DAILY (Patient taking differently: APPLY TOPICALLY TWO TIMES DAILY AS NEEDED FOR SKIN IRRITATION) 30 mL 0  . famotidine (PEPCID) 20 MG tablet Take 2 tablets (40 mg total) by mouth daily. (Patient taking differently: Take 40 mg by mouth every evening. ) 180 tablet 3  . fluticasone (FLONASE) 50 MCG/ACT nasal spray Place 1  spray into both nostrils 2 (two) times daily. (Patient taking differently: Place 1 spray into both nostrils daily as needed for allergies. ) 16 g 11  . hydrocortisone (ANUSOL-HC) 2.5 % rectal cream Place 1 application rectally 2 (two) times daily. (Patient taking differently: Place 1 application rectally 2 (two) times daily as needed (IRRITATION). )  30 g 0  . metFORMIN (GLUCOPHAGE-XR) 500 MG 24 hr tablet Take 1 tablet (500 mg total) by mouth 2 (two) times daily. 180 tablet 3  . mupirocin cream (BACTROBAN) 2 % Apply 1 application topically 2 (two) times daily. (Patient taking differently: Apply 1 application topically 2 (two) times daily as needed (pressure callus). ) 15 g 0  . nicotine polacrilex (NICORETTE) 4 MG gum Take 4 mg by mouth as needed for smoking cessation.    . pravastatin (PRAVACHOL) 20 MG tablet Take 1 tablet (20 mg total) by mouth daily. 30 tablet 11  . oxyCODONE (OXY IR/ROXICODONE) 5 MG immediate release tablet Take 1 tablet (5 mg total) by mouth every 6 (six) hours as needed for moderate pain. (Patient not taking: Reported on 04/02/2017) 30 tablet 0   No current facility-administered medications for this visit.     ROS:   General:  No weight loss, Fever, chills  HEENT: No recent headaches, no nasal bleeding, no visual changes, no sore throat  Neurologic: No dizziness, blackouts, seizures. No recent symptoms of stroke or mini- stroke. No recent episodes of slurred speech, or temporary blindness.  Cardiac: No recent episodes of chest pain/pressure, no shortness of breath at rest.  No shortness of breath with exertion.  Denies history of atrial fibrillation or irregular heartbeat  Vascular: No history of rest pain in feet.  No history of claudication.  No history of non-healing ulcer, No history of DVT   Pulmonary: No home oxygen, no productive cough, no hemoptysis,  No asthma or wheezing  Musculoskeletal:  [ ]  Arthritis, [ ]  Low back pain,  [ ]  Joint pain  Hematologic:No  history of hypercoagulable state.  No history of easy bleeding.  No history of anemia  Gastrointestinal: No hematochezia or melena,  No gastroesophageal reflux, no trouble swallowing  Urinary: [X]  chronic Kidney disease, [ ]  on HD - [ ]  MWF or [ ]  TTHS, [ ]  Burning with urination, [ ]  Frequent urination, [ ]  Difficulty urinating;   Skin: No rashes  Psychological: No history of anxiety,  No history of depression   Physical Examination  Vitals:   04/02/17 1525 04/02/17 1529  BP: 135/66 135/65  Pulse: (!) 57 (!) 58  Resp: 14   Temp: (!) 97.3 F (36.3 C)   TempSrc: Oral   SpO2: 97%   Weight: 148 lb (67.1 kg)   Height: 5\' 2"  (1.575 m)     Body mass index is 27.07 kg/m.  General:  Alert and oriented, no acute distress HEENT: Normal Neck: No bruit or JVD, absent left carotid pulse Pulmonary: Clear to auscultation bilaterally Cardiac: Regular Rate and Rhythm without murmur Skin: No rash Extremity Pulses:  2+ radial, brachial bilaterally Musculoskeletal: No deformity or edema  Neurologic: Upper and lower extremity motor 5/5 and symmetric subtle gait abnormality although more related to her polio rather than her prior stroke  DATA:  I reviewed the patient's CT angios images arch and carotid angiogram.  This shows a greater than 80% right internal carotid artery stenosis with some mild tortuosity of the distal ICA.  There is at least 7 cm of common carotid artery proximally.  I agree with Dr. Scot Dock that this is a high lesion in standard carotid endarterectomy would be difficult.  I also reviewed the carotid angiogram which confirmed the above findings but also confirmed that the left subclavian carotid bypass is occluded.  ASSESSMENT: Patient with symptomatic right internal carotid artery stenosis.  She is at high risk  for recurrent stroke episode.  I discussed with her today the possibility of continued medical management with risk of 5-10 %/year of recurrent stroke.  I believe that  she would be a candidate for TCAR carotid stenting.  I did discuss with her the risk of stroke from this is going to be about 1-3%.  Other risk benefits possible complications of procedure details were discussed with the patient today.  I will review the images with the [place] people tomorrow.  Tentatively she will be scheduled for TCAR stenting on Friday, April 10, 2017.   PLAN: See above   Ruta Hinds, MD [facility] Office: [phone] Pager: [phone]

## 2017-04-02 NOTE — H&P (View-Only) (Signed)
Referring Physician: Gae Gallop MD  Patient name: Paula Watson MRN: [id number] DOB: [birth year] Sex: female  REASON FOR CONSULT: Symptomatic right carotid stenosis  HPI: Paula Watson is a 71 y.o. female who several weeks ago had bilateral cortex stroke left and right frontal right parietal and left temporal.  These had the appearance of embolic event.  The patient was noted to have a greater than 80% right internal carotid artery stenosis.  She has a chronic left common carotid occlusion despite previous subclavian to carotid bypass.  This is now occluded.  She is currently on Plavix and aspirin.  She had a carotid angiogram by Dr. Brock Bad which again confirmed the lesion.  However he was reluctant to proceed apparently due to distal ICA tortuosity and potential soft plaque.  The.  She did have a stroke also in 2007 which resulted with right-sided weakness.  This improved.  She currently has some balance issues with her gait and mild left hand weakness.  This is improving.  She does walk with a limp due to atrophy of her right leg from polio in the past.  Other medical problems include diabetes hyperlipidemia hypertension.  These are all currently controlled.  Past Medical History:  Diagnosis Date  . Anemia   . 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 Blood transfusion   . Bronchitis    "have had a couple of times in my lifetime"  . Cancer ([place]) 1985   cervical  . Cataract   . CKD (chronic kidney disease) stage 1, GFR 90 ml/min or greater was told by her PCP   Intolerant to ACE according to her PCP  . Diabetes mellitus    tyle 2 diabetic  . Dyspnea   . GERD (gastroesophageal reflux disease)   . Hemorrhoids   . High cholesterol   . Hyperlipidemia   . Hypertension   . Polio 1953  . Poliomyelitis since 1948   has drop foot on R  . Stroke [facility]) 2007 affected R side  . Vertigo now improved   Past Surgical History:  Procedure Laterality Date  . CAROTID ENDARTERECTOMY Left 2000 L  side  . CERVICAL CONIZATION W/BX  1985  . COLONOSCOPY    . CORONARY ANGIOPLASTY  stented 2007  . DILATION AND CURETTAGE OF UTERUS  1980's  . foot reconstructjion  1953   right foot; post polio  . IR ANGIO EXTERNAL CAROTID SEL EXT CAROTID UNI R MOD SED  03/06/2017  . IR ANGIO INTRA EXTRACRAN SEL COM CAROTID INNOMINATE UNI R MOD SED  03/24/2017  . IR ANGIO VERTEBRAL SEL SUBCLAVIAN INNOMINATE BILAT MOD SED  03/09/2017  . IR ANGIO VERTEBRAL SEL VERTEBRAL BILAT MOD SED  03/06/2017  . IR RADIOLOGIST EVAL & MGMT  07/21/2016  . RADIOLOGY WITH ANESTHESIA N/A 03/24/2017   Procedure: STENTING;  Surgeon: Luanne Bras, MD;  Location: [facility];  Service: Radiology;  Laterality: N/A;  . TONSILLECTOMY  1954    Family History  Problem Relation Age of Onset  . Coronary artery disease Mother   . Heart disease Mother   . Diabetes type II Father   . Cancer Father        brain stem  . Brain cancer Father   . Diabetes Father   . Cancer Sister        breast with recurrance  . Diabetes Sister   . Stroke Sister   . Coronary artery disease Brother   . COPD Maternal Grandmother  breast  . COPD Paternal Grandmother        breast  . Heart disease Brother   . Heart attack Brother   . COPD Other     SOCIAL HISTORY: Social History   Socioeconomic History  . Marital status: Divorced    Spouse name: Not on file  . Number of children: Not on file  . Years of education: Not on file  . Highest education level: Not on file  Social Needs  . Financial resource strain: Not on file  . Food insecurity - worry: Not on file  . Food insecurity - inability: Not on file  . Transportation needs - medical: Not on file  . Transportation needs - non-medical: Not on file  Occupational History  . Not on file  Tobacco Use  . Smoking status: Former Smoker    Packs/day: 0.25    Years: 50.00    Pack years: 12.50    Types: Cigarettes    Last attempt to quit: 03/03/2017    Years since quitting: 0.0  .  Smokeless tobacco: Never Used  . Tobacco comment: "working hard to quit"  Substance and Sexual Activity  . Alcohol use: No  . Drug use: No  . Sexual activity: No    Birth control/protection: Post-menopausal  Other Topics Concern  . Not on file  Social History Narrative  . Not on file    Allergies  Allergen Reactions  . Penicillins Hives and Swelling    PATIENT HAS HAD A PCN REACTION WITH IMMEDIATE RASH, FACIAL/TONGUE/THROAT SWELLING, SOB, OR LIGHTHEADEDNESS WITH HYPOTENSION:  #  #  #  YES  #  #  #   Has patient had a PCN reaction causing severe rash involving mucus membranes or skin necrosis: Unknown Has patient had a PCN reaction that required hospitalization: No Has patient had a PCN reaction occurring within the last 10 years: No     Current Outpatient Medications  Medication Sig Dispense Refill  . albuterol (PROVENTIL HFA;VENTOLIN HFA) 108 (90 Base) MCG/ACT inhaler Inhale 2 puffs into the lungs every 6 (six) hours as needed for wheezing or shortness of breath. 1 Inhaler 6  . amLODipine (NORVASC) 10 MG tablet Take 1 tablet (10 mg total) by mouth daily. 30 tablet 3  . aspirin EC 325 MG EC tablet Take 1 tablet (325 mg total) by mouth daily. 30 tablet 0  . bisacodyl (DULCOLAX) 5 MG EC tablet Take 1 tablet (5 mg total) by mouth daily as needed for moderate constipation. 30 tablet 0  . carvedilol (COREG) 12.5 MG tablet Take 1 tablet (12.5 mg total) by mouth 2 (two) times daily with a meal. 60 tablet 2  . clopidogrel (PLAVIX) 75 MG tablet Take 1 tablet (75 mg total) by mouth daily. 30 tablet 2  . clotrimazole-betamethasone (LOTRISONE) lotion APPLY TOPICALLY TWO TIMES DAILY (Patient taking differently: APPLY TOPICALLY TWO TIMES DAILY AS NEEDED FOR SKIN IRRITATION) 30 mL 0  . famotidine (PEPCID) 20 MG tablet Take 2 tablets (40 mg total) by mouth daily. (Patient taking differently: Take 40 mg by mouth every evening. ) 180 tablet 3  . fluticasone (FLONASE) 50 MCG/ACT nasal spray Place 1  spray into both nostrils 2 (two) times daily. (Patient taking differently: Place 1 spray into both nostrils daily as needed for allergies. ) 16 g 11  . hydrocortisone (ANUSOL-HC) 2.5 % rectal cream Place 1 application rectally 2 (two) times daily. (Patient taking differently: Place 1 application rectally 2 (two) times daily as needed (IRRITATION). )  30 g 0  . metFORMIN (GLUCOPHAGE-XR) 500 MG 24 hr tablet Take 1 tablet (500 mg total) by mouth 2 (two) times daily. 180 tablet 3  . mupirocin cream (BACTROBAN) 2 % Apply 1 application topically 2 (two) times daily. (Patient taking differently: Apply 1 application topically 2 (two) times daily as needed (pressure callus). ) 15 g 0  . nicotine polacrilex (NICORETTE) 4 MG gum Take 4 mg by mouth as needed for smoking cessation.    . pravastatin (PRAVACHOL) 20 MG tablet Take 1 tablet (20 mg total) by mouth daily. 30 tablet 11  . oxyCODONE (OXY IR/ROXICODONE) 5 MG immediate release tablet Take 1 tablet (5 mg total) by mouth every 6 (six) hours as needed for moderate pain. (Patient not taking: Reported on 04/02/2017) 30 tablet 0   No current facility-administered medications for this visit.     ROS:   General:  No weight loss, Fever, chills  HEENT: No recent headaches, no nasal bleeding, no visual changes, no sore throat  Neurologic: No dizziness, blackouts, seizures. No recent symptoms of stroke or mini- stroke. No recent episodes of slurred speech, or temporary blindness.  Cardiac: No recent episodes of chest pain/pressure, no shortness of breath at rest.  No shortness of breath with exertion.  Denies history of atrial fibrillation or irregular heartbeat  Vascular: No history of rest pain in feet.  No history of claudication.  No history of non-healing ulcer, No history of DVT   Pulmonary: No home oxygen, no productive cough, no hemoptysis,  No asthma or wheezing  Musculoskeletal:  [ ]  Arthritis, [ ]  Low back pain,  [ ]  Joint pain  Hematologic:No  history of hypercoagulable state.  No history of easy bleeding.  No history of anemia  Gastrointestinal: No hematochezia or melena,  No gastroesophageal reflux, no trouble swallowing  Urinary: [X]  chronic Kidney disease, [ ]  on HD - [ ]  MWF or [ ]  TTHS, [ ]  Burning with urination, [ ]  Frequent urination, [ ]  Difficulty urinating;   Skin: No rashes  Psychological: No history of anxiety,  No history of depression   Physical Examination  Vitals:   04/02/17 1525 04/02/17 1529  BP: 135/66 135/65  Pulse: (!) 57 (!) 58  Resp: 14   Temp: (!) 97.3 F (36.3 C)   TempSrc: Oral   SpO2: 97%   Weight: 148 lb (67.1 kg)   Height: 5\' 2"  (1.575 m)     Body mass index is 27.07 kg/m.  General:  Alert and oriented, no acute distress HEENT: Normal Neck: No bruit or JVD, absent left carotid pulse Pulmonary: Clear to auscultation bilaterally Cardiac: Regular Rate and Rhythm without murmur Skin: No rash Extremity Pulses:  2+ radial, brachial bilaterally Musculoskeletal: No deformity or edema  Neurologic: Upper and lower extremity motor 5/5 and symmetric subtle gait abnormality although more related to her polio rather than her prior stroke  DATA:  I reviewed the patient's CT angios images arch and carotid angiogram.  This shows a greater than 80% right internal carotid artery stenosis with some mild tortuosity of the distal ICA.  There is at least 7 cm of common carotid artery proximally.  I agree with Dr. Scot Dock that this is a high lesion in standard carotid endarterectomy would be difficult.  I also reviewed the carotid angiogram which confirmed the above findings but also confirmed that the left subclavian carotid bypass is occluded.  ASSESSMENT: Patient with symptomatic right internal carotid artery stenosis.  She is at high risk  for recurrent stroke episode.  I discussed with her today the possibility of continued medical management with risk of 5-10 %/year of recurrent stroke.  I believe that  she would be a candidate for TCAR carotid stenting.  I did discuss with her the risk of stroke from this is going to be about 1-3%.  Other risk benefits possible complications of procedure details were discussed with the patient today.  I will review the images with the [place] people tomorrow.  Tentatively she will be scheduled for TCAR stenting on Friday, April 10, 2017.   PLAN: See above   Ruta Hinds, MD [facility] Office: [phone] Pager: [phone]

## 2017-04-03 DIAGNOSIS — I69392 Facial weakness following cerebral infarction: Secondary | ICD-10-CM | POA: Diagnosis not present

## 2017-04-03 DIAGNOSIS — H539 Unspecified visual disturbance: Secondary | ICD-10-CM | POA: Diagnosis not present

## 2017-04-03 DIAGNOSIS — I69398 Other sequelae of cerebral infarction: Secondary | ICD-10-CM | POA: Diagnosis not present

## 2017-04-03 DIAGNOSIS — I69328 Other speech and language deficits following cerebral infarction: Secondary | ICD-10-CM | POA: Diagnosis not present

## 2017-04-03 DIAGNOSIS — I69352 Hemiplegia and hemiparesis following cerebral infarction affecting left dominant side: Secondary | ICD-10-CM | POA: Diagnosis not present

## 2017-04-08 ENCOUNTER — Telehealth: Payer: Self-pay

## 2017-04-08 DIAGNOSIS — I69352 Hemiplegia and hemiparesis following cerebral infarction affecting left dominant side: Secondary | ICD-10-CM | POA: Diagnosis not present

## 2017-04-08 DIAGNOSIS — I69392 Facial weakness following cerebral infarction: Secondary | ICD-10-CM | POA: Diagnosis not present

## 2017-04-08 DIAGNOSIS — I69328 Other speech and language deficits following cerebral infarction: Secondary | ICD-10-CM | POA: Diagnosis not present

## 2017-04-08 DIAGNOSIS — H539 Unspecified visual disturbance: Secondary | ICD-10-CM | POA: Diagnosis not present

## 2017-04-08 DIAGNOSIS — I69398 Other sequelae of cerebral infarction: Secondary | ICD-10-CM | POA: Diagnosis not present

## 2017-04-08 NOTE — Telephone Encounter (Signed)
Patient is having TCAR surgery Friday 12/28 with Dr. Oneida Alar. She is questioning if she should go through with the surgery and her son has questions. The son has requested to speak with Dr. Oneida Alar. Dr. Oneida Alar is in the office tomorrow, will have him or Zigmund Daniel call patient in AM. Son's name is Edison Nasuti and his contact number is [phone].

## 2017-04-09 ENCOUNTER — Encounter (HOSPITAL_COMMUNITY)
Admission: RE | Admit: 2017-04-09 | Discharge: 2017-04-09 | Disposition: A | Discharge status: Home / Self Care | Payer: Medicare Other | Source: Ambulatory Visit | Attending: Vascular Surgery | Admitting: Vascular Surgery

## 2017-04-09 ENCOUNTER — Other Ambulatory Visit: Payer: Self-pay

## 2017-04-09 ENCOUNTER — Encounter (HOSPITAL_COMMUNITY): Payer: Self-pay

## 2017-04-09 DIAGNOSIS — I6521 Occlusion and stenosis of right carotid artery: Secondary | ICD-10-CM | POA: Diagnosis not present

## 2017-04-09 DIAGNOSIS — I69351 Hemiplegia and hemiparesis following cerebral infarction affecting right dominant side: Secondary | ICD-10-CM | POA: Diagnosis not present

## 2017-04-09 DIAGNOSIS — K219 Gastro-esophageal reflux disease without esophagitis: Secondary | ICD-10-CM | POA: Diagnosis not present

## 2017-04-09 DIAGNOSIS — E1122 Type 2 diabetes mellitus with diabetic chronic kidney disease: Secondary | ICD-10-CM | POA: Diagnosis not present

## 2017-04-09 DIAGNOSIS — E785 Hyperlipidemia, unspecified: Secondary | ICD-10-CM | POA: Diagnosis not present

## 2017-04-09 DIAGNOSIS — Z9861 Coronary angioplasty status: Secondary | ICD-10-CM | POA: Diagnosis not present

## 2017-04-09 DIAGNOSIS — N181 Chronic kidney disease, stage 1: Secondary | ICD-10-CM | POA: Diagnosis not present

## 2017-04-09 DIAGNOSIS — R51 Headache: Secondary | ICD-10-CM | POA: Diagnosis not present

## 2017-04-09 DIAGNOSIS — Z7902 Long term (current) use of antithrombotics/antiplatelets: Secondary | ICD-10-CM | POA: Diagnosis not present

## 2017-04-09 DIAGNOSIS — Z7982 Long term (current) use of aspirin: Secondary | ICD-10-CM | POA: Diagnosis not present

## 2017-04-09 DIAGNOSIS — Z880 Allergy status to penicillin: Secondary | ICD-10-CM | POA: Diagnosis not present

## 2017-04-09 DIAGNOSIS — M62561 Muscle wasting and atrophy, not elsewhere classified, right lower leg: Secondary | ICD-10-CM | POA: Diagnosis not present

## 2017-04-09 DIAGNOSIS — I129 Hypertensive chronic kidney disease with stage 1 through stage 4 chronic kidney disease, or unspecified chronic kidney disease: Secondary | ICD-10-CM | POA: Diagnosis not present

## 2017-04-09 HISTORY — DX: Personal history of urinary calculi: Z87.442

## 2017-04-09 HISTORY — DX: Personal history of other diseases of the digestive system: Z87.19

## 2017-04-09 HISTORY — DX: Cardiac arrhythmia, unspecified: I49.9

## 2017-04-09 LAB — CBC
HCT: 41.3 % (ref 36.0–46.0)
Hemoglobin: 13.6 g/dL (ref 12.0–15.0)
MCH: 28.2 pg (ref 26.0–34.0)
MCHC: 32.9 g/dL (ref 30.0–36.0)
MCV: 85.7 fL (ref 78.0–100.0)
PLATELETS: 241 10*3/uL (ref 150–400)
RBC: 4.82 MIL/uL (ref 3.87–5.11)
RDW: 13.7 % (ref 11.5–15.5)
WBC: 10.5 10*3/uL (ref 4.0–10.5)

## 2017-04-09 LAB — TYPE AND SCREEN
ABO/RH(D): O POS
Antibody Screen: NEGATIVE

## 2017-04-09 LAB — HEMOGLOBIN A1C
Hgb A1c MFr Bld: 6.3 % — ABNORMAL HIGH (ref 4.8–5.6)
Mean Plasma Glucose: 134.11 mg/dL

## 2017-04-09 LAB — COMPREHENSIVE METABOLIC PANEL
ALBUMIN: 3.8 g/dL (ref 3.5–5.0)
ALT: 13 U/L — AB (ref 14–54)
AST: 20 U/L (ref 15–41)
Alkaline Phosphatase: 73 U/L (ref 38–126)
Anion gap: 12 (ref 5–15)
BUN: 19 mg/dL (ref 6–20)
CHLORIDE: 102 mmol/L (ref 101–111)
CO2: 24 mmol/L (ref 22–32)
CREATININE: 1.31 mg/dL — AB (ref 0.44–1.00)
Calcium: 8.9 mg/dL (ref 8.9–10.3)
GFR calc Af Amer: 46 mL/min — ABNORMAL LOW (ref >60)
GFR, EST NON AFRICAN AMERICAN: 40 mL/min — AB (ref >60)
GLUCOSE: 107 mg/dL — AB (ref 65–99)
Potassium: 3.5 mmol/L (ref 3.5–5.1)
Sodium: 138 mmol/L (ref 135–145)
Total Bilirubin: 0.9 mg/dL (ref 0.3–1.2)
Total Protein: 7.1 g/dL (ref 6.5–8.1)

## 2017-04-09 LAB — PROTIME-INR
INR: 1.01
PROTHROMBIN TIME: 13.2 s (ref 11.4–15.2)

## 2017-04-09 LAB — URINALYSIS, ROUTINE W REFLEX MICROSCOPIC
BILIRUBIN URINE: NEGATIVE
GLUCOSE, UA: NEGATIVE mg/dL
KETONES UR: NEGATIVE mg/dL
LEUKOCYTES UA: NEGATIVE
NITRITE: NEGATIVE
PH: 5 (ref 5.0–8.0)
Protein, ur: 30 mg/dL — AB
Specific Gravity, Urine: 1.014 (ref 1.005–1.030)

## 2017-04-09 LAB — GLUCOSE, CAPILLARY: Glucose-Capillary: 105 mg/dL — ABNORMAL HIGH (ref 65–99)

## 2017-04-09 LAB — SURGICAL PCR SCREEN
MRSA, PCR: NEGATIVE
Staphylococcus aureus: NEGATIVE

## 2017-04-09 LAB — ABO/RH: ABO/RH(D): O POS

## 2017-04-09 LAB — APTT: aPTT: 29 seconds (ref 24–36)

## 2017-04-09 MED ORDER — VANCOMYCIN HCL IN DEXTROSE 1-5 GM/200ML-% IV SOLN
1000.0000 mg | INTRAVENOUS | Status: AC
  Administered 2017-04-10: 1000 mg via INTRAVENOUS
  Filled 2017-04-09: qty 200

## 2017-04-09 NOTE — Pre-Procedure Instructions (Addendum)
Paula Watson  04/09/2017      [facility] [address] [place] [address] [place] [address] Phone: [phone] Fax: [phone]    Your procedure is scheduled on  Friday 04/10/17  Report to [facility] Admitting at 530 A.M.  Call this number if you have problems the morning of surgery:  [phone]   Remember:  Do not eat food or drink liquids after midnight.  Take these medicines the morning of surgery with A SIP OF WATER  ALBUTEROL INHALER, AMLODIPINE (NORVASC), CARVEDILOL (COREG), FAMOTIDINE (PEPCID)    How to Manage Your Diabetes Before and After Surgery  Why is it important to control my blood sugar before and after surgery? . Improving blood sugar levels before and after surgery helps healing and can limit problems. . A way of improving blood sugar control is eating a healthy diet by: o  Eating less sugar and carbohydrates o  Increasing activity/exercise o  Talking with your doctor about reaching your blood sugar goals . High blood sugars (greater than 180 mg/dL) can raise your risk of infections and slow your recovery, so you will need to focus on controlling your diabetes during the weeks before surgery. . Make sure that the doctor who takes care of your diabetes knows about your planned surgery including the date and location.  How do I manage my blood sugar before surgery? . Check your blood sugar at least 4 times a day, starting 2 days before surgery, to make sure that the level is not too high or low. o Check your blood sugar the morning of your surgery when you wake up and every 2 hours until you get to the Short Stay unit. . If your blood sugar is less than 70 mg/dL, you will need to treat for low blood sugar: o Do not take insulin. o Treat a low blood sugar (less than 70 mg/dL) with  cup of clear juice (cranberry or apple), 4 glucose tablets, OR glucose gel. Recheck blood sugar in 15 minutes after treatment  (to make sure it is greater than 70 mg/dL). If your blood sugar is not greater than 70 mg/dL on recheck, call [phone] o  for further instructions. . Report your blood sugar to the short stay nurse when you get to Short Stay.  . If you are admitted to the hospital after surgery: o Your blood sugar will be checked by the staff and you will probably be given insulin after surgery (instead of oral diabetes medicines) to make sure you have good blood sugar levels. o The goal for blood sugar control after surgery is 80-180 mg/dL.              WHAT DO I DO ABOUT MY DIABETES MEDICATION?   Marland Kitchen Do not take oral diabetes medicines (pills) the morning of surgery.  .        .             Patient Signature:  Date:   Nurse Signature:  Date:   Reviewed and Endorsed by [facility] Patient Education Committee, August 2015  Do not wear jewelry, make-up or nail polish.  Do not wear lotions, powders, or perfumes, or deodorant.  Do not shave 48 hours prior to surgery.  Men may shave face and neck.  Do not bring valuables to the hospital.  [facility] is not responsible for any belongings or valuables.  Contacts, dentures or bridgework may  not be worn into surgery.  Leave your suitcase in the car.  After surgery it may be brought to your room.  For patients admitted to the hospital, discharge time will be determined by your treatment team.  Patients discharged the day of surgery will not be allowed to drive home.   Name and phone number of your driver:    Special instructions:  [name] - Preparing for Surgery  Before surgery, you can play an important role.  Because skin is not sterile, your skin needs to be as free of germs as possible.  You can reduce the number of germs on you skin by washing with CHG (chlorahexidine gluconate) soap before surgery.  CHG is an antiseptic cleaner which kills germs and bonds with the skin to continue killing germs even after washing.  Please  DO NOT use if you have an allergy to CHG or antibacterial soaps.  If your skin becomes reddened/irritated stop using the CHG and inform your nurse when you arrive at Short Stay.  Do not shave (including legs and underarms) for at least 48 hours prior to the first CHG shower.  You may shave your face.  Please follow these instructions carefully:   1.  Shower with CHG Soap the night before surgery and the                                morning of Surgery.  2.  If you choose to wash your hair, wash your hair first as usual with your       normal shampoo.  3.  After you shampoo, rinse your hair and body thoroughly to remove the                      Shampoo.  4.  Use CHG as you would any other liquid soap.  You can apply chg directly       to the skin and wash gently with scrungie or a clean washcloth.  5.  Apply the CHG Soap to your body ONLY FROM THE NECK DOWN.        Do not use on open wounds or open sores.  Avoid contact with your eyes,       ears, mouth and genitals (private parts).  Wash genitals (private parts)       with your normal soap.  6.  Wash thoroughly, paying special attention to the area where your surgery        will be performed.  7.  Thoroughly rinse your body with warm water from the neck down.  8.  DO NOT shower/wash with your normal soap after using and rinsing off       the CHG Soap.  9.  Pat yourself dry with a clean towel.            10.  Wear clean pajamas.            11.  Place clean sheets on your bed the night of your first shower and do not        sleep with pets.  Day of Surgery  Do not apply any lotions/deoderants the morning of surgery.  Please wear clean clothes to the hospital/surgery center.    Please read over the following fact sheets that you were given. MRSA Information and Surgical Site Infection Prevention

## 2017-04-10 ENCOUNTER — Encounter (HOSPITAL_COMMUNITY)
Admission: RE | Disposition: A | Discharge status: Home / Self Care | Payer: Self-pay | Source: Ambulatory Visit | Attending: Vascular Surgery

## 2017-04-10 ENCOUNTER — Inpatient Hospital Stay (HOSPITAL_COMMUNITY): Payer: Medicare Other | Admitting: Emergency Medicine

## 2017-04-10 ENCOUNTER — Inpatient Hospital Stay (HOSPITAL_COMMUNITY)
Admission: RE | Admit: 2017-04-10 | Discharge: 2017-04-11 | DRG: 038 | Disposition: A | Discharge status: Home / Self Care | Payer: Medicare Other | Source: Ambulatory Visit | Attending: Vascular Surgery | Admitting: Vascular Surgery

## 2017-04-10 ENCOUNTER — Inpatient Hospital Stay (HOSPITAL_COMMUNITY): Payer: Medicare Other | Admitting: Certified Registered"

## 2017-04-10 ENCOUNTER — Encounter (HOSPITAL_COMMUNITY): Payer: Self-pay | Admitting: Surgery

## 2017-04-10 ENCOUNTER — Telehealth: Payer: Self-pay | Admitting: Vascular Surgery

## 2017-04-10 DIAGNOSIS — R51 Headache: Secondary | ICD-10-CM | POA: Diagnosis not present

## 2017-04-10 DIAGNOSIS — I69351 Hemiplegia and hemiparesis following cerebral infarction affecting right dominant side: Secondary | ICD-10-CM | POA: Diagnosis not present

## 2017-04-10 DIAGNOSIS — E1122 Type 2 diabetes mellitus with diabetic chronic kidney disease: Secondary | ICD-10-CM | POA: Diagnosis present

## 2017-04-10 DIAGNOSIS — Z7902 Long term (current) use of antithrombotics/antiplatelets: Secondary | ICD-10-CM

## 2017-04-10 DIAGNOSIS — N181 Chronic kidney disease, stage 1: Secondary | ICD-10-CM | POA: Diagnosis present

## 2017-04-10 DIAGNOSIS — I63239 Cerebral infarction due to unspecified occlusion or stenosis of unspecified carotid arteries: Secondary | ICD-10-CM | POA: Diagnosis present

## 2017-04-10 DIAGNOSIS — I63231 Cerebral infarction due to unspecified occlusion or stenosis of right carotid arteries: Secondary | ICD-10-CM | POA: Diagnosis not present

## 2017-04-10 DIAGNOSIS — Z9861 Coronary angioplasty status: Secondary | ICD-10-CM

## 2017-04-10 DIAGNOSIS — Z7982 Long term (current) use of aspirin: Secondary | ICD-10-CM

## 2017-04-10 DIAGNOSIS — E785 Hyperlipidemia, unspecified: Secondary | ICD-10-CM | POA: Diagnosis present

## 2017-04-10 DIAGNOSIS — I129 Hypertensive chronic kidney disease with stage 1 through stage 4 chronic kidney disease, or unspecified chronic kidney disease: Secondary | ICD-10-CM | POA: Diagnosis present

## 2017-04-10 DIAGNOSIS — M62561 Muscle wasting and atrophy, not elsewhere classified, right lower leg: Secondary | ICD-10-CM | POA: Diagnosis present

## 2017-04-10 DIAGNOSIS — K219 Gastro-esophageal reflux disease without esophagitis: Secondary | ICD-10-CM | POA: Diagnosis present

## 2017-04-10 DIAGNOSIS — I6521 Occlusion and stenosis of right carotid artery: Secondary | ICD-10-CM | POA: Diagnosis not present

## 2017-04-10 DIAGNOSIS — J441 Chronic obstructive pulmonary disease with (acute) exacerbation: Secondary | ICD-10-CM | POA: Diagnosis not present

## 2017-04-10 DIAGNOSIS — I10 Essential (primary) hypertension: Secondary | ICD-10-CM | POA: Diagnosis not present

## 2017-04-10 DIAGNOSIS — G459 Transient cerebral ischemic attack, unspecified: Secondary | ICD-10-CM | POA: Diagnosis not present

## 2017-04-10 DIAGNOSIS — Z880 Allergy status to penicillin: Secondary | ICD-10-CM

## 2017-04-10 LAB — GLUCOSE, CAPILLARY
GLUCOSE-CAPILLARY: 180 mg/dL — AB (ref 65–99)
Glucose-Capillary: 111 mg/dL — ABNORMAL HIGH (ref 65–99)
Glucose-Capillary: 101 mg/dL — ABNORMAL HIGH (ref 65–99)
Glucose-Capillary: 107 mg/dL — ABNORMAL HIGH (ref 65–99)

## 2017-04-10 SURGERY — TRANSCAROTID ARTERY REVASCULARIZATION (TCAR)
Anesthesia: General | Site: Neck | Laterality: Right

## 2017-04-10 MED ORDER — PROPOFOL 10 MG/ML IV BOLUS
INTRAVENOUS | Status: AC
  Filled 2017-04-10: qty 20

## 2017-04-10 MED ORDER — ASPIRIN EC 325 MG PO TBEC
325.0000 mg | DELAYED_RELEASE_TABLET | Freq: Every day | ORAL | Status: DC
  Administered 2017-04-11: 325 mg via ORAL
  Filled 2017-04-10: qty 1

## 2017-04-10 MED ORDER — HEPARIN SODIUM (PORCINE) 1000 UNIT/ML IJ SOLN
INTRAMUSCULAR | Status: DC | PRN
  Administered 2017-04-10: 7000 [IU] via INTRAVENOUS

## 2017-04-10 MED ORDER — CARVEDILOL 12.5 MG PO TABS
12.5000 mg | ORAL_TABLET | Freq: Two times a day (BID) | ORAL | Status: DC
  Administered 2017-04-11: 12.5 mg via ORAL
  Filled 2017-04-10: qty 1

## 2017-04-10 MED ORDER — ROCURONIUM BROMIDE 10 MG/ML (PF) SYRINGE
PREFILLED_SYRINGE | INTRAVENOUS | Status: AC
  Filled 2017-04-10: qty 5

## 2017-04-10 MED ORDER — FENTANYL CITRATE (PF) 100 MCG/2ML IJ SOLN
INTRAMUSCULAR | Status: DC | PRN
  Administered 2017-04-10 (×3): 50 ug via INTRAVENOUS

## 2017-04-10 MED ORDER — PRAVASTATIN SODIUM 20 MG PO TABS
20.0000 mg | ORAL_TABLET | Freq: Every day | ORAL | Status: DC
  Administered 2017-04-10: 20 mg via ORAL
  Filled 2017-04-10: qty 1

## 2017-04-10 MED ORDER — CHLORHEXIDINE GLUCONATE CLOTH 2 % EX PADS: 6.0000 | MEDICATED_PAD | Freq: Once | CUTANEOUS | Status: DC

## 2017-04-10 MED ORDER — MAGNESIUM SULFATE 2 GM/50ML IV SOLN
2.0000 g | Freq: Every day | INTRAVENOUS | Status: DC | PRN
  Filled 2017-04-10: qty 50

## 2017-04-10 MED ORDER — SODIUM CHLORIDE 0.9 % IV SOLN: INTRAVENOUS | Status: DC

## 2017-04-10 MED ORDER — INSULIN ASPART 100 UNIT/ML ~~LOC~~ SOLN: 0.0000 [IU] | Freq: Every day | SUBCUTANEOUS | Status: DC

## 2017-04-10 MED ORDER — ALUM & MAG HYDROXIDE-SIMETH 200-200-20 MG/5ML PO SUSP: 15.0000 mL | ORAL | Status: DC | PRN

## 2017-04-10 MED ORDER — FENTANYL CITRATE (PF) 100 MCG/2ML IJ SOLN
INTRAMUSCULAR | Status: AC
  Filled 2017-04-10: qty 2

## 2017-04-10 MED ORDER — GUAIFENESIN-DM 100-10 MG/5ML PO SYRP: 15.0000 mL | ORAL_SOLUTION | ORAL | Status: DC | PRN

## 2017-04-10 MED ORDER — MEPERIDINE HCL 25 MG/ML IJ SOLN: 6.2500 mg | INTRAMUSCULAR | Status: DC | PRN

## 2017-04-10 MED ORDER — METFORMIN HCL ER 500 MG PO TB24
500.0000 mg | ORAL_TABLET | Freq: Two times a day (BID) | ORAL | Status: DC
  Filled 2017-04-10: qty 1

## 2017-04-10 MED ORDER — HEPARIN SODIUM (PORCINE) 5000 UNIT/ML IJ SOLN
5000.0000 [IU] | Freq: Three times a day (TID) | INTRAMUSCULAR | Status: DC
  Administered 2017-04-10 – 2017-04-11 (×2): 5000 [IU] via SUBCUTANEOUS
  Filled 2017-04-10 (×3): qty 1

## 2017-04-10 MED ORDER — POTASSIUM CHLORIDE CRYS ER 20 MEQ PO TBCR: 20.0000 meq | EXTENDED_RELEASE_TABLET | Freq: Every day | ORAL | Status: DC | PRN

## 2017-04-10 MED ORDER — INSULIN ASPART 100 UNIT/ML ~~LOC~~ SOLN
0.0000 [IU] | Freq: Three times a day (TID) | SUBCUTANEOUS | Status: DC
  Administered 2017-04-11: 2 [IU] via SUBCUTANEOUS

## 2017-04-10 MED ORDER — BISACODYL 5 MG PO TBEC: 5.0000 mg | DELAYED_RELEASE_TABLET | Freq: Every day | ORAL | Status: DC | PRN

## 2017-04-10 MED ORDER — IODIXANOL 320 MG/ML IV SOLN
INTRAVENOUS | Status: DC | PRN
  Administered 2017-04-10: 40 mL via INTRAVENOUS

## 2017-04-10 MED ORDER — FENTANYL CITRATE (PF) 100 MCG/2ML IJ SOLN
25.0000 ug | INTRAMUSCULAR | Status: DC | PRN
  Administered 2017-04-10: 50 ug via INTRAVENOUS
  Administered 2017-04-10 (×2): 25 ug via INTRAVENOUS

## 2017-04-10 MED ORDER — ROCURONIUM BROMIDE 100 MG/10ML IV SOLN
INTRAVENOUS | Status: DC | PRN
  Administered 2017-04-10: 60 mg via INTRAVENOUS

## 2017-04-10 MED ORDER — LACTATED RINGERS IV SOLN
INTRAVENOUS | Status: DC | PRN
  Administered 2017-04-10 (×2): via INTRAVENOUS

## 2017-04-10 MED ORDER — LIDOCAINE 2% (20 MG/ML) 5 ML SYRINGE
INTRAMUSCULAR | Status: AC
  Filled 2017-04-10: qty 5

## 2017-04-10 MED ORDER — DEXTROSE-NACL 5-0.45 % IV SOLN
INTRAVENOUS | Status: DC
  Administered 2017-04-10: 18:00:00 via INTRAVENOUS

## 2017-04-10 MED ORDER — SODIUM CHLORIDE 0.9 % IV SOLN: 500.0000 mL | Freq: Once | INTRAVENOUS | Status: DC | PRN

## 2017-04-10 MED ORDER — METOPROLOL TARTRATE 5 MG/5ML IV SOLN: 2.0000 mg | INTRAVENOUS | Status: DC | PRN

## 2017-04-10 MED ORDER — NICOTINE POLACRILEX 2 MG MT GUM: 4.0000 mg | CHEWING_GUM | OROMUCOSAL | Status: DC | PRN

## 2017-04-10 MED ORDER — ONDANSETRON HCL 4 MG/2ML IJ SOLN: 4.0000 mg | Freq: Once | INTRAMUSCULAR | Status: DC | PRN

## 2017-04-10 MED ORDER — SODIUM CHLORIDE 0.9 % IV SOLN
0.0500 ug/kg/min | INTRAVENOUS | Status: AC
  Administered 2017-04-10: 0.1 ug/kg/min via INTRAVENOUS
  Filled 2017-04-10: qty 5000

## 2017-04-10 MED ORDER — ONDANSETRON HCL 4 MG/2ML IJ SOLN: 4.0000 mg | Freq: Four times a day (QID) | INTRAMUSCULAR | Status: DC | PRN

## 2017-04-10 MED ORDER — PHENYLEPHRINE HCL 10 MG/ML IJ SOLN
INTRAVENOUS | Status: DC | PRN
  Administered 2017-04-10: 30 ug/min via INTRAVENOUS

## 2017-04-10 MED ORDER — PANTOPRAZOLE SODIUM 40 MG PO TBEC
40.0000 mg | DELAYED_RELEASE_TABLET | Freq: Every day | ORAL | Status: DC
  Administered 2017-04-11: 40 mg via ORAL
  Filled 2017-04-10: qty 1

## 2017-04-10 MED ORDER — SODIUM CHLORIDE 0.9 % IV SOLN
INTRAVENOUS | Status: DC | PRN
  Administered 2017-04-10: 07:00:00

## 2017-04-10 MED ORDER — HYDROMORPHONE HCL 1 MG/ML IJ SOLN: 0.2500 mg | INTRAMUSCULAR | Status: DC | PRN

## 2017-04-10 MED ORDER — ACETAMINOPHEN 650 MG RE SUPP: 325.0000 mg | RECTAL | Status: DC | PRN

## 2017-04-10 MED ORDER — MORPHINE SULFATE (PF) 2 MG/ML IV SOLN: 2.0000 mg | INTRAVENOUS | Status: DC | PRN

## 2017-04-10 MED ORDER — ALBUTEROL SULFATE (2.5 MG/3ML) 0.083% IN NEBU: 2.5000 mg | INHALATION_SOLUTION | Freq: Four times a day (QID) | RESPIRATORY_TRACT | Status: DC | PRN

## 2017-04-10 MED ORDER — SUGAMMADEX SODIUM 200 MG/2ML IV SOLN
INTRAVENOUS | Status: DC | PRN
  Administered 2017-04-10: 140 mg via INTRAVENOUS

## 2017-04-10 MED ORDER — OXYCODONE HCL 5 MG PO TABS
5.0000 mg | ORAL_TABLET | Freq: Four times a day (QID) | ORAL | Status: DC | PRN
Start: 2017-04-10 — End: 2017-04-10

## 2017-04-10 MED ORDER — ONDANSETRON HCL 4 MG/2ML IJ SOLN
INTRAMUSCULAR | Status: DC | PRN
  Administered 2017-04-10: 4 mg via INTRAVENOUS

## 2017-04-10 MED ORDER — PHENOL 1.4 % MT LIQD: 1.0000 | OROMUCOSAL | Status: DC | PRN

## 2017-04-10 MED ORDER — PROPOFOL 10 MG/ML IV BOLUS
INTRAVENOUS | Status: DC | PRN
  Administered 2017-04-10: 40 mg via INTRAVENOUS
  Administered 2017-04-10: 10 mg via INTRAVENOUS

## 2017-04-10 MED ORDER — LIDOCAINE HCL (CARDIAC) 20 MG/ML IV SOLN
INTRAVENOUS | Status: DC | PRN
  Administered 2017-04-10: 60 mg via INTRAVENOUS

## 2017-04-10 MED ORDER — AMLODIPINE BESYLATE 10 MG PO TABS
10.0000 mg | ORAL_TABLET | Freq: Every day | ORAL | Status: DC
  Administered 2017-04-11: 10 mg via ORAL
  Filled 2017-04-10: qty 1

## 2017-04-10 MED ORDER — OXYCODONE HCL 5 MG PO TABS: 5.0000 mg | ORAL_TABLET | Freq: Once | ORAL | Status: DC | PRN

## 2017-04-10 MED ORDER — GLYCOPYRROLATE 0.2 MG/ML IJ SOLN
INTRAMUSCULAR | Status: DC | PRN
  Administered 2017-04-10 (×3): 0.2 mg via INTRAVENOUS

## 2017-04-10 MED ORDER — ALBUTEROL SULFATE HFA 108 (90 BASE) MCG/ACT IN AERS: 2.0000 | INHALATION_SPRAY | Freq: Four times a day (QID) | RESPIRATORY_TRACT | Status: DC | PRN

## 2017-04-10 MED ORDER — OXYCODONE HCL 5 MG/5ML PO SOLN: 5.0000 mg | Freq: Once | ORAL | Status: DC | PRN

## 2017-04-10 MED ORDER — FENTANYL CITRATE (PF) 250 MCG/5ML IJ SOLN
INTRAMUSCULAR | Status: AC
  Filled 2017-04-10: qty 5

## 2017-04-10 MED ORDER — CLOPIDOGREL BISULFATE 75 MG PO TABS
75.0000 mg | ORAL_TABLET | Freq: Every day | ORAL | Status: DC
  Administered 2017-04-11: 75 mg via ORAL
  Filled 2017-04-10: qty 1

## 2017-04-10 MED ORDER — LIDOCAINE HCL (PF) 1 % IJ SOLN
INTRAMUSCULAR | Status: AC
Start: 2017-04-10 — End: 2017-04-10
  Filled 2017-04-10: qty 30

## 2017-04-10 MED ORDER — DOCUSATE SODIUM 100 MG PO CAPS
100.0000 mg | ORAL_CAPSULE | Freq: Every day | ORAL | Status: DC
  Administered 2017-04-11: 100 mg via ORAL
  Filled 2017-04-10: qty 1

## 2017-04-10 MED ORDER — LABETALOL HCL 5 MG/ML IV SOLN: 10.0000 mg | INTRAVENOUS | Status: DC | PRN

## 2017-04-10 MED ORDER — HYDRALAZINE HCL 20 MG/ML IJ SOLN: 5.0000 mg | INTRAMUSCULAR | Status: DC | PRN

## 2017-04-10 MED ORDER — ACETAMINOPHEN 325 MG PO TABS
325.0000 mg | ORAL_TABLET | ORAL | Status: DC | PRN
  Administered 2017-04-10 (×2): 650 mg via ORAL
  Filled 2017-04-10 (×2): qty 2

## 2017-04-10 MED ORDER — OXYCODONE HCL 5 MG PO TABS
5.0000 mg | ORAL_TABLET | ORAL | Status: DC | PRN
  Administered 2017-04-10 – 2017-04-11 (×2): 10 mg via ORAL
  Filled 2017-04-10 (×2): qty 2

## 2017-04-10 SURGICAL SUPPLY — 74 items
ADH SKN CLS APL DERMABOND .7 (GAUZE/BANDAGES/DRESSINGS) ×1
ADH SKN CLS LQ APL DERMABOND (GAUZE/BANDAGES/DRESSINGS) ×2
AGENT HMST SPONGE THK3/8 (HEMOSTASIS)
BAG BANDED W/RUBBER/TAPE 36X54 (MISCELLANEOUS) ×2 IMPLANT
BAG EQP BAND 135X91 W/RBR TAPE (MISCELLANEOUS) ×1
BALLN STERLING RX 5X30X80 (BALLOONS) ×2
BALLOON STERLING RX 5X30X80 (BALLOONS) IMPLANT
CANISTER SUCT 3000ML PPV (MISCELLANEOUS) ×2 IMPLANT
CANNULA VESSEL 3MM 2 BLNT TIP (CANNULA) ×2 IMPLANT
CATH ROBINSON RED A/P 18FR (CATHETERS) ×1 IMPLANT
CLIP VESOCCLUDE MED 6/CT (CLIP) ×2 IMPLANT
CLIP VESOCCLUDE SM WIDE 6/CT (CLIP) ×2 IMPLANT
COVER DOME SNAP 22 D (MISCELLANEOUS) ×3 IMPLANT
COVER PROBE W GEL 5X96 (DRAPES) ×3 IMPLANT
CRADLE DONUT ADULT HEAD (MISCELLANEOUS) ×2 IMPLANT
DECANTER SPIKE VIAL GLASS SM (MISCELLANEOUS) IMPLANT
DERMABOND ADHESIVE PROPEN (GAUZE/BANDAGES/DRESSINGS) ×2
DERMABOND ADVANCED (GAUZE/BANDAGES/DRESSINGS) ×1
DERMABOND ADVANCED .7 DNX12 (GAUZE/BANDAGES/DRESSINGS) ×1 IMPLANT
DERMABOND ADVANCED .7 DNX6 (GAUZE/BANDAGES/DRESSINGS) IMPLANT
DRAIN HEMOVAC 1/8 X 5 (WOUND CARE) IMPLANT
DRAPE INCISE IOBAN 66X45 STRL (DRAPES) ×5 IMPLANT
DRAPE UNIVERSAL PACK (DRAPES) ×2 IMPLANT
ELECT REM PT RETURN 9FT ADLT (ELECTROSURGICAL) ×2
ELECTRODE REM PT RTRN 9FT ADLT (ELECTROSURGICAL) ×1 IMPLANT
EVACUATOR SILICONE 100CC (DRAIN) IMPLANT
GLOVE BIO SURGEON STRL SZ7.5 (GLOVE) ×2 IMPLANT
GLOVE BIOGEL PI IND STRL 7.5 (GLOVE) IMPLANT
GLOVE BIOGEL PI INDICATOR 7.5 (GLOVE) ×1
GLOVE SURG SS PI 7.5 STRL IVOR (GLOVE) ×1 IMPLANT
GOWN STRL REUS W/ TWL LRG LVL3 (GOWN DISPOSABLE) ×3 IMPLANT
GOWN STRL REUS W/TWL LRG LVL3 (GOWN DISPOSABLE) ×6
GUIDEWIRE ENROUTE 0.014 (WIRE) ×1 IMPLANT
HEMOSTAT SPONGE AVITENE ULTRA (HEMOSTASIS) IMPLANT
INTRODUCER KIT GALT 7CM (INTRODUCER) ×4
KIT BASIN OR (CUSTOM PROCEDURE TRAY) ×2 IMPLANT
KIT ENCORE 26 ADVANTAGE (KITS) ×3 IMPLANT
KIT INTRODUCER GALT 7 (INTRODUCER) IMPLANT
KIT ROOM TURNOVER OR (KITS) ×2 IMPLANT
NDL HYPO 25GX1X1/2 BEV (NEEDLE) IMPLANT
NEEDLE HYPO 25GX1X1/2 BEV (NEEDLE) IMPLANT
NS IRRIG 1000ML POUR BTL (IV SOLUTION) ×4 IMPLANT
PACK CAROTID (CUSTOM PROCEDURE TRAY) ×2 IMPLANT
PAD ARMBOARD 7.5X6 YLW CONV (MISCELLANEOUS) ×4 IMPLANT
PROTECTION STATION PRESSURIZED (MISCELLANEOUS) ×2
SET MICROPUNCTURE 5F STIFF (MISCELLANEOUS) ×3 IMPLANT
SHEATH AVANTI 11CM 5FR (MISCELLANEOUS) IMPLANT
SHUNT CAROTID BYPASS 10 (VASCULAR PRODUCTS) IMPLANT
SHUNT CAROTID BYPASS 12FRX15.5 (VASCULAR PRODUCTS) IMPLANT
STATION PROTECTION PRESSURIZED (MISCELLANEOUS) ×1 IMPLANT
STENT TRANSCAROTID SYSTEM 8X30 (Permanent Stent) ×1 IMPLANT
STENT TRANSCAROTID SYSTEM 8X40 (Permanent Stent) ×1 IMPLANT
STOPCOCK MORSE 400PSI 3WAY (MISCELLANEOUS) ×2 IMPLANT
SUT ETHILON 3 0 PS 1 (SUTURE) IMPLANT
SUT PROLENE 6 0 CC (SUTURE) ×2 IMPLANT
SUT SILK 2 0 PERMA HAND 18 BK (SUTURE) ×2 IMPLANT
SUT SILK 2 0SH CR/8 30 (SUTURE) ×2 IMPLANT
SUT SILK 3 0 TIES 17X18 (SUTURE)
SUT SILK 3-0 18XBRD TIE BLK (SUTURE) IMPLANT
SUT VIC AB 3-0 SH 27 (SUTURE) ×2
SUT VIC AB 3-0 SH 27X BRD (SUTURE) ×1 IMPLANT
SUT VICRYL 4-0 PS2 18IN ABS (SUTURE) ×2 IMPLANT
SYR 10ML LL (SYRINGE) ×1 IMPLANT
SYR 20CC LL (SYRINGE) ×2 IMPLANT
SYR 5ML LL (SYRINGE) ×2 IMPLANT
SYR CONTROL 10ML LL (SYRINGE) IMPLANT
SYRINGE 10CC LL (SYRINGE) ×6 IMPLANT
SYSTEM TRANSCAROTID NEUROPRTCT (MISCELLANEOUS) IMPLANT
TOWEL GREEN STERILE (TOWEL DISPOSABLE) ×2 IMPLANT
TRANSCAROTID NEUROPROTECT SYS (MISCELLANEOUS) ×2
TUBING EXTENTION W/L.L. (IV SETS) ×2 IMPLANT
WATER STERILE IRR 1000ML POUR (IV SOLUTION) ×2 IMPLANT
WIRE AMPLATZ SS-J .035X180CM (WIRE) IMPLANT
WIRE BENTSON .035X145CM (WIRE) ×3 IMPLANT

## 2017-04-10 NOTE — Anesthesia Procedure Notes (Signed)
Arterial Line Insertion Start/End12/28/2018 7:05 AM, 04/10/2017 7:15 AM Performed by: Verdie Drown, CRNA, CRNA  Patient location: Pre-op. Preanesthetic checklist: patient identified, IV checked, site marked, risks and benefits discussed, surgical consent, monitors and equipment checked, pre-op evaluation, timeout performed and anesthesia consent Lidocaine 1% used for infiltration Left, radial was placed Catheter size: 20 G Hand hygiene performed , maximum sterile barriers used  and Seldinger technique used  Attempts: 1 Procedure performed without using ultrasound guided technique. Following insertion, line sutured, dressing applied and Biopatch. Patient tolerated the procedure well with no immediate complications.

## 2017-04-10 NOTE — Telephone Encounter (Signed)
-----   Message from Mena Goes, RN sent at 04/10/2017 12:27 PM EST ----- Regarding: 2-3 weeks with carotid duplex post op    ----- Message ----- From: Elam Dutch, MD Sent: 04/10/2017  10:03 AM To: Vvs Charge Pool  TCAR right side  [facility] asst  She needs follow up appt in 2-3 weeks with carotid duplex to establish post stent baseline  Ruta Hinds

## 2017-04-10 NOTE — Progress Notes (Signed)
Patient arrived to the Unit from PACU. Patient had a right trans carotid artery revascularization. Patient is alert and oriented. Patient VS are stable B/P 102/64, Heart Rate 59 and O295%on RA. Patient is complaining of a headache and requesting Tylenol. Will give Tylenol

## 2017-04-10 NOTE — Progress Notes (Signed)
Pt in PACU.  More awake now.  A little bit of weakness in left arm but rapidly improving as she wakes up and is able to understand commands.  Otherwise no neuro deficits.  Will recheck in a little bit after more awake.  Ruta Hinds, MD [facility] Office: [phone] Pager: [phone]

## 2017-04-10 NOTE — Progress Notes (Signed)
Pt awake and alert. Left arm feels fine.  Neuro at baseline. No hematoma. Plavix/ASA/statin on discharge tomorrow Will need follow up in 2-3 weeks with carotid duplex  Ruta Hinds, MD [facility]: [phone] Pager: [phone]

## 2017-04-10 NOTE — Transfer of Care (Signed)
Immediate Anesthesia Transfer of Care Note  Patient: Paula Watson  Procedure(s) Performed: TRANSCAROTID ARTERY REVASCULARIZATION (Right Neck)  Patient Location: PACU  Anesthesia Type:General  Level of Consciousness: awake and patient cooperative  Airway & Oxygen Therapy: Patient Spontanous Breathing  Post-op Assessment: Report given to RN and Post -op Vital signs reviewed and stable  Post vital signs: Reviewed and stable  Last Vitals:  Vitals:   04/10/17 0546  BP: (!) 156/43  Pulse: (!) 57  Resp: 18  Temp: 36.8 C  SpO2: 99%    Last Pain:  Vitals:   04/10/17 0546  TempSrc: Oral      Patients Stated Pain Goal: 3 (37/34/28 7681)  Complications: No apparent anesthesia complications

## 2017-04-10 NOTE — Progress Notes (Signed)
Pt fully awake States her left arm feels heavy but motor function completely intact at baseline Difficult to know if this is due to arm boards etc Do not believe this represents a neuro event  To stepdown when bed available  Ruta Hinds, MD [facility]: [phone] Pager: [phone]

## 2017-04-10 NOTE — Anesthesia Procedure Notes (Addendum)
Procedure Name: Intubation Date/Time: 04/10/2017 7:52 AM Performed by: Lance Coon, CRNA Pre-anesthesia Checklist: Patient identified, Emergency Drugs available, Suction available, Patient being monitored and Timeout performed Patient Re-evaluated:Patient Re-evaluated prior to induction Oxygen Delivery Method: Circle system utilized Preoxygenation: Pre-oxygenation with 100% oxygen Induction Type: IV induction Ventilation: Mask ventilation without difficulty Laryngoscope Size: Miller and 3 Grade View: Grade I Tube type: Oral Tube size: 7.0 mm Number of attempts: 1 Airway Equipment and Method: Stylet Placement Confirmation: ETT inserted through vocal cords under direct vision,  positive ETCO2 and breath sounds checked- equal and bilateral Secured at: 21 cm Tube secured with: Tape Dental Injury: Teeth and Oropharynx as per pre-operative assessment

## 2017-04-10 NOTE — Op Note (Signed)
Procedure: Trans carotid artery revascularization (TCAR) right carotid stent (8 x 30)  Preoperative diagnosis: Symptomatic right internal carotid artery stenosis  Postoperative diagnosis: Same  Anesthesia: Gen.  Assistant: Annamarie Major M.D.  Indications: Patient is an 71 year old female who recently sustained a right brain TIA with left arm weakness.  She has a high carotid lesion anatomically with tortuousity.  Operative findings: #1 80% right internal carotid artery stenosis stented to residual less than 20% stenosis (8 x 30 mm ENROUTE stent)  #2 right femoral venous access  Operative details: After obtaining informed consent, the patient was taken the operating room. The patient was placed in supine position upper table. After induction of general anesthesia and endotracheal intubation, Foley catheter was placed. Next the patient was inspected with ultrasound on the right neck. Measurements were taken from the right carotid bifurcation down to the level of the clavicle on the common carotid. This was approximate 6.5 cm.At this point the entire right neck and chest were prepped and draped in usual sterile fashion. The right groin was also prepped and draped in usual sterile fashion. A time out was performed. Next a oblique incision was made between the heads of the sternocleidomastoid muscle on the right side of the neck. Incision was carried down through the platysma to the level of the right internal jugular vein. This was reflected laterally. The vagus nerve was identified and protected. Common carotid artery was dissected free circumferentially at the base of the incision. This was elevated up in the operative field with an umbilical tape. Approximately 3-4 cm of the artery was dissected free circumferentially to allow adequate exposure. A pursestring suture was placed in the artery at the area we were planning to puncture using a running 6-0 Prolene suture.At this point femoral venous access  was established via the left groin. Ultrasound was used to identify the right common femoral vein. A micropuncture needle was used to cannulate the right common femoral vein and a micropuncture wire advanced into the vein and the micropuncture sheath placed over this. An 47 Bentsen wire was advanced up into the left femoral venous system. The sheath for the flow reversal system was placed into the left femoral system and thoroughly flushed with heparinized saline. The patient was given 7,000 units of total heparin to establish an ACT greater than 260. At this point a micropuncture needle was used to cannulate the right common carotid artery. The micropuncture wire was advanced just below the level of the carotid bifurcation. The micropuncture sheath was then advanced over this about 2 cm into the common carotid artery. This was thoroughly flushed with heparinized saline.a contrast angiogram was then performed in AP projection to confirm that we were truly intraluminal with the sheath. This was also used to determine the level of the carotid bifurcation. The carotid Amplatz wire was then placed through the micropuncture sheath and the sheath removed. The 8 French flow reversal arterial sheath was then advanced over the guidewire and into the common carotid artery. Sheath was sutured to the skin with 3 2-0 silk sutures.Contrast angiogram was again performed to make sure that we were within the true lumen. This was done in 2 views.  The filter device was switched to the low-flow selection. The flow reversal system was then hooked up to the arterial end of the sheath after everything had been thoroughly flushed and de-aired. Passive flow was used to fill the arterial and of the filter and through the filter and up to the level of  the venous sheath. The venous sheath was also fully de-aired and passive flow was established. This was checked by saline infusion in the venous port and noted to flush easily. High flow was  then turned on on the filter device. At this point a TCAR timeout was performed to make sure that the patient's blood pressure was reasonable. It was systolic 989 at this point. We again confirmed that the ACT was above 250. The balloon and guidewire insufflator and stent were all selected. These were all prepped. A 5 x 30 mm angioplasty balloon had been selected based on the preoperative CT. Based on the angiogram intraoperatively as well as preoperative CT and 8 x 40 mm Enroute stent was selected. The 014 wire was slightly shaped opening and due to slight curvature of the internal carotid artery. This was advanced with the balloon over it into the common carotid artery and a guidewire used to selectively catheterize the right internal carotid artery. The lesion was moderately calcified and tortuous turning back on itself..We confirmed that we were within the internal carotid artery by first establishing that the guidewire advanced into the petrous portion of the internal carotid artery and also with contrast angiogram. At this point the 5 x 30 balloon was centered on the lesion and inflated to 8 atm slowly inflating and deflating the balloon. The patient had been given a dose of glycopyrrolate as well as atropine and an additional dose of glycopyrrolate to establish and maintain her blood pressure and heart rate. After predilatation a contrast angiogram was again performed which showed some improvement in the stenosis. We then brought up the 8 x 40 Enroute stent and advanced and centered this on the lesion.  This was going to be too long.  So it was removed and exchanged for an 8 x 30 stent.  This was then deployed using a pinch technique after opening the Tuohy Borst valve.  We then gave the patient 2 minutes of additional flow reversal before performing a completion angiogram.  Completion angiogram showed a residual 30% waist.  So a post dilation was performed with the 5 mm balloon centered on the lesion. An AP  and lateral angiogram were performed to confirm that the stent was fully deployed after post dilation. At this point the guidewire was removed. The flow reversal system was clamped off at the arterial sheath and disconnected. The remaining blood was returned to the patient. This was then disconnected from the venous sheath. The arterial sheath was removed and the pursestring suture cinched down. The venous sheath was pulled and hemostasis obtained with direct pressure. The carotid was inspected found to be without any area of hematoma or active bleeding. The platysma muscle was reapproximated using a running 3-0 Vicryl suture. The skin was closed with a 4-0 Vicryl subcuticular stitch. Dermabond was applied to the incision. Patient was awakened in the operating room and moving her upper and lower extremities.  She had some weakness in her left arm but it was difficult to tell if this was any real change due to drowsiness and nausea. She was taken to the recovery room in stable condition and will be reevaluated in the PACU.  Ruta Hinds, MD [facility] Office: [phone] Pager: [phone]

## 2017-04-10 NOTE — Telephone Encounter (Signed)
Spoke to son for f/u appt on 1/10  Mailed letter 04/10/17 bg

## 2017-04-10 NOTE — Interval H&P Note (Signed)
History and Physical Interval Note:  04/10/2017 7:25 AM  Paula Watson  has presented today for surgery, with the diagnosis of right carotid stenosis  The various methods of treatment have been discussed with the patient and family. After consideration of risks, benefits and other options for treatment, the patient has consented to  Procedure(s): TRANSCAROTID ARTERY REVASCULARIZATION (Right) as a surgical intervention .  The patient's history has been reviewed, patient examined, no change in status, stable for surgery.  I have reviewed the patient's chart and labs.  Questions were answered to the patient's satisfaction.     Ruta Hinds

## 2017-04-10 NOTE — Anesthesia Preprocedure Evaluation (Signed)
Anesthesia Evaluation  Patient identified by MRN, date of birth, ID band Patient awake    Reviewed: Allergy & Precautions, NPO status , Patient's Chart, lab work & pertinent test results  History of Anesthesia Complications Negative for: history of anesthetic complications  Airway Mallampati: III  TM Distance: >3 FB Neck ROM: Full    Dental  (+) Teeth Intact   Pulmonary shortness of breath, neg sleep apnea, COPD, neg recent URI, former smoker,    breath sounds clear to auscultation       Cardiovascular hypertension, Pt. on home beta blockers and Pt. on medications (-) angina(-) Past MI and (-) CHF  Rhythm:Regular     Neuro/Psych Left weakness TIA Neuromuscular disease CVA, Residual Symptoms negative psych ROS   GI/Hepatic Neg liver ROS, hiatal hernia, GERD  Medicated and Controlled,  Endo/Other  diabetes, Type 2, Oral Hypoglycemic Agents  Renal/GU CRFRenal disease     Musculoskeletal   Abdominal   Peds  Hematology negative hematology ROS (+)   Anesthesia Other Findings   Reproductive/Obstetrics                             Anesthesia Physical Anesthesia Plan  ASA: III  Anesthesia Plan: General   Post-op Pain Management:    Induction: Intravenous  PONV Risk Score and Plan: 3  Airway Management Planned: Oral ETT  Additional Equipment: Arterial line  Intra-op Plan:   Post-operative Plan: Extubation in OR  Informed Consent: I have reviewed the patients History and Physical, chart, labs and discussed the procedure including the risks, benefits and alternatives for the proposed anesthesia with the patient or authorized representative who has indicated his/her understanding and acceptance.   Dental advisory given  Plan Discussed with: CRNA and Surgeon  Anesthesia Plan Comments:         Anesthesia Quick Evaluation

## 2017-04-11 ENCOUNTER — Other Ambulatory Visit: Payer: Self-pay

## 2017-04-11 LAB — BASIC METABOLIC PANEL
Anion gap: 11 (ref 5–15)
BUN: 17 mg/dL (ref 6–20)
CALCIUM: 7.9 mg/dL — AB (ref 8.9–10.3)
CO2: 20 mmol/L — ABNORMAL LOW (ref 22–32)
CREATININE: 1.4 mg/dL — AB (ref 0.44–1.00)
Chloride: 106 mmol/L (ref 101–111)
GFR calc non Af Amer: 37 mL/min — ABNORMAL LOW (ref >60)
GFR, EST AFRICAN AMERICAN: 43 mL/min — AB (ref >60)
Glucose, Bld: 128 mg/dL — ABNORMAL HIGH (ref 65–99)
Potassium: 3.9 mmol/L (ref 3.5–5.1)
SODIUM: 137 mmol/L (ref 135–145)

## 2017-04-11 LAB — CBC
HCT: 35.9 % — ABNORMAL LOW (ref 36.0–46.0)
Hemoglobin: 11.4 g/dL — ABNORMAL LOW (ref 12.0–15.0)
MCH: 27.9 pg (ref 26.0–34.0)
MCHC: 31.8 g/dL (ref 30.0–36.0)
MCV: 87.8 fL (ref 78.0–100.0)
PLATELETS: 185 10*3/uL (ref 150–400)
RBC: 4.09 MIL/uL (ref 3.87–5.11)
RDW: 14.3 % (ref 11.5–15.5)
WBC: 8.7 10*3/uL (ref 4.0–10.5)

## 2017-04-11 LAB — POCT ACTIVATED CLOTTING TIME: Activated Clotting Time: 268 seconds

## 2017-04-11 LAB — GLUCOSE, CAPILLARY: GLUCOSE-CAPILLARY: 134 mg/dL — AB (ref 65–99)

## 2017-04-11 MED ORDER — HEPARIN SODIUM (PORCINE) 5000 UNIT/ML IJ SOLN: 5000.0000 [IU] | Freq: Three times a day (TID) | INTRAMUSCULAR | Status: DC

## 2017-04-11 MED ORDER — OXYCODONE HCL 5 MG PO TABS: 5.0000 mg | ORAL_TABLET | Freq: Four times a day (QID) | ORAL | 0 refills | Status: DC | PRN

## 2017-04-11 NOTE — Progress Notes (Signed)
Patient c/o headache throughout the night, pain meds administered.

## 2017-04-11 NOTE — Discharge Instructions (Signed)
° °  [facility]  Discharge Instructions   Carotid Surgery  Please refer to the following instructions for your post-procedure care. Your surgeon or physician assistant will discuss any changes with you.  Activity  You are encouraged to walk as much as you can. You can slowly return to normal activities but must avoid strenuous activity and heavy lifting until your doctor tell you it's OK. Avoid activities such as vacuuming or swinging a golf club. You can drive after one week if you are comfortable and you are no longer taking prescription pain medications. It is normal to feel tired for serval weeks after your surgery. It is also normal to have difficulty with sleep habits, eating, and bowel movements after surgery. These will go away with time.  Bathing/Showering  You may shower after you go home. Do not soak in a bathtub, hot tub, or swim until the incision heals completely.  Incision Care  Shower every day. Clean your incision with mild soap and water. Pat the area dry with a clean towel. You do not need a bandage unless otherwise instructed. Do not apply any ointments or creams to your incision. You may have skin glue on your incision. Do not peel it off. It will come off on its own in about one week. Your incision may feel thickened and raised for several weeks after your surgery. This is normal and the skin will soften over time. For Men Only: It's OK to shave around the incision but do not shave the incision itself for 2 weeks. It is common to have numbness under your chin that could last for several months.  Diet  Resume your normal diet. There are no special food restrictions following this procedure. A low fat/low cholesterol diet is recommended for all patients with vascular disease. In order to heal from your surgery, it is CRITICAL to get adequate nutrition. Your body requires vitamins, minerals, and protein. Vegetables are the best source of  vitamins and minerals. Vegetables also provide the perfect balance of protein. Processed food has little nutritional value, so try to avoid this.  Medications  Resume taking all of your medications unless your doctor or physician assistant tells you not to. If your incision is causing pain, you may take over-the- counter pain relievers such as acetaminophen (Tylenol). If you were prescribed a stronger pain medication, please be aware these medications can cause nausea and constipation. Prevent nausea by taking the medication with a snack or meal. Avoid constipation by drinking plenty of fluids and eating foods with a high amount of fiber, such as fruits, vegetables, and grains.  Do not take Tylenol if you are taking prescription pain medications.  RESTART YOUR METFORMIN ON 04/13/17.  Follow Up  Our office will schedule a follow up appointment 2-3 weeks following discharge.  Please call us immediately for any of the following conditions  Increased pain, redness, drainage (pus) from your incision site. Fever of 101 degrees or higher. If you should develop stroke (slurred speech, difficulty swallowing, weakness on one side of your body, loss of vision) you should call 911 and go to the nearest emergency room.  Reduce your risk of vascular disease:  Stop smoking. If you would like help call [facility] at 1-800-QUIT-NOW 346-225-2349) or [facility] at 440-628-5375. Manage your cholesterol Maintain a desired weight Control your diabetes Keep your blood pressure down  If you have any questions, please call the office at [phone].

## 2017-04-11 NOTE — Anesthesia Postprocedure Evaluation (Signed)
Anesthesia Post Note  Patient: Paula Watson  Procedure(s) Performed: TRANSCAROTID ARTERY REVASCULARIZATION (Right Neck)     Patient location during evaluation: PACU Anesthesia Type: General Level of consciousness: awake and alert Pain management: pain level controlled Vital Signs Assessment: post-procedure vital signs reviewed and stable Respiratory status: spontaneous breathing, nonlabored ventilation, respiratory function stable and patient connected to nasal cannula oxygen Cardiovascular status: blood pressure returned to baseline and stable Postop Assessment: no apparent nausea or vomiting Anesthetic complications: no    Last Vitals:  Vitals:   04/11/17 0505 04/11/17 0745  BP: (!) 173/60 (!) 147/61  Pulse: 68 65  Resp: 18 11  Temp: 37.5 C 37.4 C  SpO2: 94% 92%    Last Pain:  Vitals:   04/11/17 0745  TempSrc: Oral  PainSc: 0-No pain                 Ayham Word

## 2017-04-11 NOTE — Discharge Summary (Signed)
Discharge Summary     Paula Watson [birth year] 71 y.o. female  [id number]  Admission Date: 04/10/2017  Discharge Date: 04/11/17  Physician: Elam Dutch, MD  Admission Diagnosis: right carotid stenosis   HPI:   This is a 71 y.o. female who several weeks ago had bilateral cortex stroke left and right frontal right parietal and left temporal.  These had the appearance of embolic event.  The patient was noted to have a greater than 80% right internal carotid artery stenosis.  She has a chronic left common carotid occlusion despite previous subclavian to carotid bypass.  This is now occluded.  She is currently on Plavix and aspirin.  She had a carotid angiogram by Dr. Brock Bad which again confirmed the lesion.  However he was reluctant to proceed apparently due to distal ICA tortuosity and potential soft plaque.  The.  She did have a stroke also in 2007 which resulted with right-sided weakness.  This improved.  She currently has some balance issues with her gait and mild left hand weakness.  This is improving.  She does walk with a limp due to atrophy of her right leg from polio in the past.  Other medical problems include diabetes hyperlipidemia hypertension.  These are all currently controlled.  Hospital Course:  The patient was admitted to the hospital and taken to the operating room on 04/10/2017 and underwent Trans carotid artery revascularization (TCAR) right carotid stent (8 x 30)  Operative findings: #1 80% right internal carotid artery stenosis stented to residual less than 20% stenosis (8 x 30 mm ENROUTE stent); #2 right femoral venous access  The pt tolerated the procedure well and was transported to the PACU in good condition.  In the pacu, the pt was more awake and had a little bit of weakness in the left arm but rapidly improving.  Otherwise, no neuro deficits.  Later in the afternoon, she stated that her left arm felt heavy but motor function completely in tact  at baseline.  Dr. Oneida Alar did not feel this was a neurologic event.   Later in the day, the pt was awake and alert and left arm felt fine.  Her neuro exam was at baseline.  She did not have a hematoma.    By POD 1, the pt neuro status was in tact.  Her grips were symmetrical bilaterally.  Her incision is clean and dry without hematoma.  Swallowing okay and tongue is midline.  She did have a generalized headache after surgery that was not lateralized.  She states this improved with Oxycodone and sleep.  Her headache has resolved at discharge.    The remainder of the hospital course consisted of increasing mobilization and increasing intake of solids without difficulty.   Recent Labs    04/09/17 1539 04/11/17 0335  NA 138 137  K 3.5 3.9  CL 102 106  CO2 24 20*  GLUCOSE 107* 128*  BUN 19 17  CALCIUM 8.9 7.9*   Recent Labs    04/09/17 1539 04/11/17 0335  WBC 10.5 8.7  HGB 13.6 11.4*  HCT 41.3 35.9*  PLT 241 185   Recent Labs    04/09/17 1539  INR 1.01       Discharge Diagnosis:  right carotid stenosis  Secondary Diagnosis: Patient Active Problem List   Diagnosis Date Noted  . Carotid stenosis, symptomatic, with infarction ([place]) 04/10/2017  . Left-sided weakness 03/11/2017  . At high risk for falls 03/11/2017  . Stroke (cerebrum) ([place]) 03/03/2017  .  Palpitation 02/19/2017  . Frequent falls 02/19/2017  . Appetite loss 02/19/2017  . Nausea & vomiting 02/19/2017  . History of post poliomyelitis muscular atrophy 06/09/2016  . Well woman exam 06/09/2016  . History of stroke 06/09/2016  . Aneurysm of artery ([place]) 06/09/2016  . Sciatica of right side 02/04/2016  . Chronic right-sided low back pain with right-sided sciatica 02/04/2016  . Chronic nonseasonal allergic rhinitis due to pollen 02/04/2016  . COPD exacerbation ([place]) 02/04/2016  . TIA (transient ischemic attack) 02/19/2011  . HTN (hypertension), benign 02/19/2011  . DM (diabetes mellitus) type II controlled  with renal manifestation ([place]) 02/19/2011  . Hypokalemia 02/19/2011  . Leukocytosis 02/19/2011  . UTI (lower urinary tract infection) 02/19/2011  . Tobacco abuse 02/19/2011   Past Medical History:  Diagnosis Date  . Anemia   . Aneurysm of artery ([place])   . Blood transfusion   . Bronchitis    "have had a couple of times in my lifetime"  . Cancer ([place]) 1985   cervical  . Cataract   . CKD (chronic kidney disease) stage 1, GFR 90 ml/min or greater was told by her PCP   Intolerant to ACE according to her PCP  . Diabetes mellitus    tyle 2 diabetic  . Dyspnea   . Dysrhythmia    :"SKIPS A BEAT "   SCHEDULED FOR STRESS TEST 2019  . GERD (gastroesophageal reflux disease)   . Hemorrhoids   . High cholesterol   . History of hiatal hernia   . History of kidney stones   . Hyperlipidemia   . Hypertension   . Polio 1953  . Poliomyelitis since 1948   has drop foot on R  . Stroke [facility]) 2007 affected R side  . Vertigo now improved    Allergies as of 04/11/2017      Reactions   Penicillins Hives, Swelling   PATIENT HAS HAD A PCN REACTION WITH IMMEDIATE RASH, FACIAL/TONGUE/THROAT SWELLING, SOB, OR LIGHTHEADEDNESS WITH HYPOTENSION:  #  #  #  YES  #  #  #   Has patient had a PCN reaction causing severe rash involving mucus membranes or skin necrosis: Unknown Has patient had a PCN reaction that required hospitalization: No Has patient had a PCN reaction occurring within the last 10 years: No      Medication List    TAKE these medications   albuterol 108 (90 Base) MCG/ACT inhaler Commonly known as:  PROVENTIL HFA;VENTOLIN HFA Inhale 2 puffs into the lungs every 6 (six) hours as needed for wheezing or shortness of breath.   amLODipine 10 MG tablet Commonly known as:  NORVASC Take 1 tablet (10 mg total) by mouth daily.   aspirin 325 MG EC tablet Take 1 tablet (325 mg total) by mouth daily.   bisacodyl 5 MG EC tablet Commonly known as:  DULCOLAX Take 1 tablet (5 mg total) by mouth  daily as needed for moderate constipation.   carvedilol 12.5 MG tablet Commonly known as:  COREG Take 1 tablet (12.5 mg total) by mouth 2 (two) times daily with a meal.   clopidogrel 75 MG tablet Commonly known as:  PLAVIX Take 1 tablet (75 mg total) by mouth daily.   clotrimazole-betamethasone lotion Commonly known as:  LOTRISONE APPLY TOPICALLY TWO TIMES DAILY What changed:  See the new instructions.   famotidine 20 MG tablet Commonly known as:  PEPCID Take 2 tablets (40 mg total) by mouth daily. What changed:  when to take this  fluticasone 50 MCG/ACT nasal spray Commonly known as:  FLONASE Place 1 spray into both nostrils 2 (two) times daily. What changed:    when to take this  reasons to take this   hydrocortisone 2.5 % rectal cream Commonly known as:  ANUSOL-HC Place 1 application rectally 2 (two) times daily. What changed:    when to take this  reasons to take this   metFORMIN 500 MG 24 hr tablet Commonly known as:  GLUCOPHAGE-XR Take 1 tablet (500 mg total) by mouth 2 (two) times daily.   mupirocin cream 2 % Commonly known as:  BACTROBAN Apply 1 application topically 2 (two) times daily. What changed:    when to take this  reasons to take this   nicotine polacrilex 4 MG gum Commonly known as:  NICORETTE Take 4 mg by mouth as needed for smoking cessation.   oxyCODONE 5 MG immediate release tablet Commonly known as:  Oxy IR/ROXICODONE Take 1 tablet (5 mg total) by mouth every 6 (six) hours as needed for moderate pain.   pravastatin 20 MG tablet Commonly known as:  PRAVACHOL Take 1 tablet (20 mg total) by mouth daily.        Discharge Instructions:   [facility] Discharge Instructions Carotid Endarterectomy (CEA)  Please refer to the following instructions for your post-procedure care. Your surgeon or physician assistant will discuss any changes with you.  Activity  You are encouraged to walk as much as  you can. You can slowly return to normal activities but must avoid strenuous activity and heavy lifting until your doctor tell you it's OK. Avoid activities such as vacuuming or swinging a golf club. You can drive after one week if you are comfortable and you are no longer taking prescription pain medications. It is normal to feel tired for serval weeks after your surgery. It is also normal to have difficulty with sleep habits, eating, and bowel movements after surgery. These will go away with time.  Bathing/Showering  You may shower after you come home. Do not soak in a bathtub, hot tub, or swim until the incision heals completely.  Incision Care  Shower every day. Clean your incision with mild soap and water. Pat the area dry with a clean towel. You do not need a bandage unless otherwise instructed. Do not apply any ointments or creams to your incision. You may have skin glue on your incision. Do not peel it off. It will come off on its own in about one week. Your incision may feel thickened and raised for several weeks after your surgery. This is normal and the skin will soften over time. For Men Only: It's OK to shave around the incision but do not shave the incision itself for 2 weeks. It is common to have numbness under your chin that could last for several months.  Diet  Resume your normal diet. There are no special food restrictions following this procedure. A low fat/low cholesterol diet is recommended for all patients with vascular disease. In order to heal from your surgery, it is CRITICAL to get adequate nutrition. Your body requires vitamins, minerals, and protein. Vegetables are the best source of vitamins and minerals. Vegetables also provide the perfect balance of protein. Processed food has little nutritional value, so try to avoid this.  Medications  Resume taking all of your medications unless your doctor or physician assistant tells you not to.  If your incision is causing pain,  you may take over-the-  counter pain relievers such as acetaminophen (Tylenol). If you were prescribed a stronger pain medication, please be aware these medications can cause nausea and constipation.  Prevent nausea by taking the medication with a snack or meal. Avoid constipation by drinking plenty of fluids and eating foods with a high amount of fiber, such as fruits, vegetables, and grains.  Do not take Tylenol if you are taking prescription pain medications.  RESTART YOUR METFORMIN ON 04/13/17 (discussed with pt)  Follow Up  Our office will schedule a follow up appointment 2-3 weeks following discharge.  Please call us immediately for any of the following conditions  Increased pain, redness, drainage (pus) from your incision site. Fever of 101 degrees or higher. If you should develop stroke (slurred speech, difficulty swallowing, weakness on one side of your body, loss of vision) you should call 911 and go to the nearest emergency room.  Reduce your risk of vascular disease:  Stop smoking. If you would like help call [facility] at 1-800-QUIT-NOW 401-510-6477) or [place] at [phone]. Manage your cholesterol Maintain a desired weight Control your diabetes Keep your blood pressure down  If you have any questions, please call the office at [phone].  Prescriptions given: Oxycodone #8 No Refill (sent electronically)  Disposition: home  Patient's condition: is Good  Follow up: 1. Dr. Oneida Alar in 2 weeks with carotid duplex   Leontine Locket, PA-C [facility] [phone]   --- For [facility] Registry use ---   Modified Rankin score at D/C (0-6): 1  IV medication needed for:  1. Hypertension: No 2. Hypotension: No  Post-op Complications: No  1. Post-op CVA or TIA: No  If yes: Event classification (right eye, left eye, right cortical, left cortical, verterobasilar, other): n/a  If yes: Timing of event (intra-op, <6 hrs post-op, >=6 hrs post-op,  unknown): n/a  2. CN injury: No  If yes: CN n/a injuried   3. Myocardial infarction: No  If yes: Dx by (EKG or clinical, Troponin): n/a  4.  CHF: No  5.  Dysrhythmia (new): No  6. Wound infection: No  7. Reperfusion symptoms: No  8. Return to OR: No  If yes: return to OR for (bleeding, neurologic, other CEA incision, other): n/a  Discharge medications: Statin use:  Yes ASA use:  Yes   Beta blocker use:  Yes ACE-Inhibitor use:  No  ARB use:  No CCB use: Yes P2Y12 Antagonist use: Yes, [x ] Plavix, [ ]  Plasugrel, [ ]  Ticlopinine, [ ]  Ticagrelor, [ ]  Other, [ ]  No for medical reason, [ ]  Non-compliant, [ ]  Not-indicated Anti-coagulant use:  No, [ ]  Warfarin, [ ]  Rivaroxaban, [ ]  Dabigatran,

## 2017-04-11 NOTE — Progress Notes (Addendum)
  Progress Note    04/11/2017 8:03 AM 1 Day Post-Op  Subjective:  Sitting up eating breakfast; says the heaviness she had in her arm yesterday is better.  Says arm is back to baseline before surgery.  She has already walked in the hallways.  Tm 99.5 HR 50's-90's NSR 283'M-629'U systolic (765 systolic at 4650 and down to 354 systolic at 6568) 12% RA  Vitals:   04/11/17 0021 04/11/17 0505  BP: (!) 127/56 (!) 173/60  Pulse: (!) 54 68  Resp: 13 18  Temp:  99.5 F (37.5 C)  SpO2: 94% 94%     Physical Exam: Neuro:  Intact; grips are symmetrical Lungs:  Non labored Incision:  Clean and dry without hematoma;  CBC    Component Value Date/Time   WBC 8.7 04/11/2017 0335   RBC 4.09 04/11/2017 0335   HGB 11.4 (L) 04/11/2017 0335   HGB 13.5 02/19/2017 1228   HCT 35.9 (L) 04/11/2017 0335   HCT 40.6 02/19/2017 1228   PLT 185 04/11/2017 0335   PLT 274 02/19/2017 1228   MCV 87.8 04/11/2017 0335   MCV 85 02/19/2017 1228   MCH 27.9 04/11/2017 0335   MCHC 31.8 04/11/2017 0335   RDW 14.3 04/11/2017 0335   RDW 14.3 02/19/2017 1228   LYMPHSABS 2.6 03/24/2017 0555   LYMPHSABS 2.2 02/19/2017 1228   MONOABS 0.6 03/24/2017 0555   EOSABS 0.4 03/24/2017 0555   EOSABS 0.3 02/19/2017 1228   BASOSABS 0.0 03/24/2017 0555   BASOSABS 0.0 02/19/2017 1228    BMET    Component Value Date/Time   NA 137 04/11/2017 0335   NA 142 02/19/2017 1228   K 3.9 04/11/2017 0335   CL 106 04/11/2017 0335   CO2 20 (L) 04/11/2017 0335   GLUCOSE 128 (H) 04/11/2017 0335   BUN 17 04/11/2017 0335   BUN 23 02/19/2017 1228   CREATININE 1.40 (H) 04/11/2017 0335   CALCIUM 7.9 (L) 04/11/2017 0335   GFRNONAA 37 (L) 04/11/2017 0335   GFRAA 43 (L) 04/11/2017 0335     Intake/Output Summary (Last 24 hours) at 04/11/2017 0803 Last data filed at 04/11/2017 0519 Gross per 24 hour  Intake 3120.83 ml  Output 175 ml  Net 2945.83 ml     Assessment/Plan:  This is a 71 y.o. female who is s/p Trans carotid artery  revascularization (TCAR) right carotid stent (8 x 30)  1 Day Post-Op  -pt is doing well this am. -pt neuro exam is intact; grips are symmetrical bilaterally; no difficulty swallowing -pt has ambulated -pt has voided -creatinine is at baseline; restart Metformin on 04/13/17 due to receiving contrast yesterday. -She did have a generalized headache after surgery that was not lateralized.  She states this improved with Oxycodone and sleep.  Her headache has resolved at discharge.   -f/u with Dr. Oneida Alar in 2 weeks with carotid duplex -pt is on Plavix/statin/aspirin daily    Leontine Locket, PA-C [facility] [phone]  Addendum  I have independently interviewed and examined the patient, and I agree with the physician assistant's findings.  Neuro intact (LUE MS asx and slightly weaker -> baseline).  No neck hematoma.  No HA this AM.  HD ok.  Ok to D/C  Adele Barthel, MD, [facility] [place] Office: [phone] Pager: [phone]  04/11/2017, 9:43 AM

## 2017-04-13 DIAGNOSIS — I69352 Hemiplegia and hemiparesis following cerebral infarction affecting left dominant side: Secondary | ICD-10-CM | POA: Diagnosis not present

## 2017-04-13 DIAGNOSIS — H539 Unspecified visual disturbance: Secondary | ICD-10-CM | POA: Diagnosis not present

## 2017-04-13 DIAGNOSIS — I69392 Facial weakness following cerebral infarction: Secondary | ICD-10-CM | POA: Diagnosis not present

## 2017-04-13 DIAGNOSIS — I69328 Other speech and language deficits following cerebral infarction: Secondary | ICD-10-CM | POA: Diagnosis not present

## 2017-04-13 DIAGNOSIS — I69398 Other sequelae of cerebral infarction: Secondary | ICD-10-CM | POA: Diagnosis not present

## 2017-04-15 ENCOUNTER — Telehealth: Payer: Self-pay | Admitting: Internal Medicine

## 2017-04-15 ENCOUNTER — Encounter: Payer: Self-pay | Admitting: Gastroenterology

## 2017-04-15 ENCOUNTER — Ambulatory Visit: Payer: Medicare Other | Admitting: Gastroenterology

## 2017-04-15 NOTE — Telephone Encounter (Signed)
PATIENT WAS A NO SHOW AND LETTER SENT  °

## 2017-04-17 ENCOUNTER — Other Ambulatory Visit: Payer: Self-pay

## 2017-04-17 DIAGNOSIS — H539 Unspecified visual disturbance: Secondary | ICD-10-CM | POA: Diagnosis not present

## 2017-04-17 DIAGNOSIS — I69398 Other sequelae of cerebral infarction: Secondary | ICD-10-CM | POA: Diagnosis not present

## 2017-04-17 DIAGNOSIS — I69328 Other speech and language deficits following cerebral infarction: Secondary | ICD-10-CM | POA: Diagnosis not present

## 2017-04-17 DIAGNOSIS — I69392 Facial weakness following cerebral infarction: Secondary | ICD-10-CM | POA: Diagnosis not present

## 2017-04-17 DIAGNOSIS — I69352 Hemiplegia and hemiparesis following cerebral infarction affecting left dominant side: Secondary | ICD-10-CM | POA: Diagnosis not present

## 2017-04-17 DIAGNOSIS — I10 Essential (primary) hypertension: Secondary | ICD-10-CM

## 2017-04-17 MED ORDER — CARVEDILOL 12.5 MG PO TABS: 12.5000 mg | ORAL_TABLET | Freq: Two times a day (BID) | ORAL | 2 refills | Status: DC

## 2017-04-20 DIAGNOSIS — I69392 Facial weakness following cerebral infarction: Secondary | ICD-10-CM | POA: Diagnosis not present

## 2017-04-20 DIAGNOSIS — I69328 Other speech and language deficits following cerebral infarction: Secondary | ICD-10-CM | POA: Diagnosis not present

## 2017-04-20 DIAGNOSIS — I69398 Other sequelae of cerebral infarction: Secondary | ICD-10-CM | POA: Diagnosis not present

## 2017-04-20 DIAGNOSIS — I69352 Hemiplegia and hemiparesis following cerebral infarction affecting left dominant side: Secondary | ICD-10-CM | POA: Diagnosis not present

## 2017-04-20 DIAGNOSIS — H539 Unspecified visual disturbance: Secondary | ICD-10-CM | POA: Diagnosis not present

## 2017-04-21 ENCOUNTER — Other Ambulatory Visit: Payer: Self-pay

## 2017-04-21 ENCOUNTER — Encounter (HOSPITAL_COMMUNITY): Payer: Self-pay | Admitting: Vascular Surgery

## 2017-04-21 DIAGNOSIS — I63239 Cerebral infarction due to unspecified occlusion or stenosis of unspecified carotid arteries: Secondary | ICD-10-CM

## 2017-04-21 DIAGNOSIS — Z8673 Personal history of transient ischemic attack (TIA), and cerebral infarction without residual deficits: Secondary | ICD-10-CM

## 2017-04-21 DIAGNOSIS — G459 Transient cerebral ischemic attack, unspecified: Secondary | ICD-10-CM

## 2017-04-22 DIAGNOSIS — I69392 Facial weakness following cerebral infarction: Secondary | ICD-10-CM | POA: Diagnosis not present

## 2017-04-22 DIAGNOSIS — I69352 Hemiplegia and hemiparesis following cerebral infarction affecting left dominant side: Secondary | ICD-10-CM | POA: Diagnosis not present

## 2017-04-22 DIAGNOSIS — H539 Unspecified visual disturbance: Secondary | ICD-10-CM | POA: Diagnosis not present

## 2017-04-22 DIAGNOSIS — I69398 Other sequelae of cerebral infarction: Secondary | ICD-10-CM | POA: Diagnosis not present

## 2017-04-22 DIAGNOSIS — I69328 Other speech and language deficits following cerebral infarction: Secondary | ICD-10-CM | POA: Diagnosis not present

## 2017-04-23 ENCOUNTER — Encounter: Payer: Self-pay | Admitting: Vascular Surgery

## 2017-04-23 ENCOUNTER — Ambulatory Visit: Payer: Self-pay | Admitting: Vascular Surgery

## 2017-04-23 ENCOUNTER — Ambulatory Visit (HOSPITAL_COMMUNITY)
Admit: 2017-04-23 | Discharge: 2017-04-23 | Disposition: A | Discharge status: Home / Self Care | Payer: Medicare Other | Attending: Vascular Surgery | Admitting: Vascular Surgery

## 2017-04-23 VITALS — BP 193/81 | HR 63 | Temp 96.8°F | Resp 16 | Ht 62.0 in | Wt 149.0 lb

## 2017-04-23 DIAGNOSIS — Z48812 Encounter for surgical aftercare following surgery on the circulatory system: Secondary | ICD-10-CM | POA: Insufficient documentation

## 2017-04-23 DIAGNOSIS — Z95828 Presence of other vascular implants and grafts: Secondary | ICD-10-CM | POA: Diagnosis not present

## 2017-04-23 DIAGNOSIS — I63239 Cerebral infarction due to unspecified occlusion or stenosis of unspecified carotid arteries: Secondary | ICD-10-CM | POA: Diagnosis not present

## 2017-04-23 DIAGNOSIS — G459 Transient cerebral ischemic attack, unspecified: Secondary | ICD-10-CM | POA: Diagnosis not present

## 2017-04-23 DIAGNOSIS — I6523 Occlusion and stenosis of bilateral carotid arteries: Secondary | ICD-10-CM

## 2017-04-23 DIAGNOSIS — Z8673 Personal history of transient ischemic attack (TIA), and cerebral infarction without residual deficits: Secondary | ICD-10-CM | POA: Insufficient documentation

## 2017-04-23 LAB — VAS US CAROTID
LEFT ECA DIAS: -10 cm/s
LICADDIAS: -23 cm/s
LICADSYS: -67 cm/s
LICAPSYS: -68 cm/s
Left ICA prox dias: -24 cm/s
RIGHT CCA MID DIAS: -13 cm/s
RIGHT ECA DIAS: -18 cm/s
Right CCA prox dias: 18 cm/s
Right CCA prox sys: 125 cm/s
Right cca dist sys: -86 cm/s

## 2017-04-23 NOTE — Progress Notes (Signed)
Patient is a 72 year old female who returns for follow-up today after recent TCAR right carotid stent.  This was done on April 10, 2017.  This was done for symptomatic lesion with prior stroke.  She states she still has some subtle changes of clumsiness and proprioception deficits of her left upper extremity.  This is fairly similar to her post stroke deficit.  She is continuing to work with physical therapy.  She continues her Plavix and aspirin.  Physical exam:  Vitals:   04/23/17 0907 04/23/17 0912  BP: (!) 203/78 (!) 193/81  Pulse: 63   Resp: 16   Temp: (!) 96.8 F (36 C)   TempSrc: Oral   SpO2: 97%   Weight: 149 lb (67.6 kg)   Height: 5\' 2"  (1.575 m)     Right neck incision healing well no drainage or erythema  Neuro: Symmetric upper extremity and lower extremity motor strength no facial asymmetry tongue midline voice normal pitch no hoarseness  Data: Patient a duplex ultrasound of her stent today which showed that the stent was widely patent with no significant narrowing heavily calcified plaque chronic left common carotid artery occlusion  Assessment: Doing well status post TCAR stenting of right carotid  Plan: Follow-up 6 months with a nurse practitioner with a carotid duplex ultrasound at that point continue Plavix and aspirin  Ruta Hinds, MD [facility] [place] Office: [phone] Pager: [phone]

## 2017-04-24 ENCOUNTER — Encounter: Payer: Self-pay | Admitting: Cardiovascular Disease

## 2017-04-24 ENCOUNTER — Ambulatory Visit: Payer: Medicare Other | Admitting: Cardiovascular Disease

## 2017-04-24 ENCOUNTER — Other Ambulatory Visit: Payer: Self-pay

## 2017-04-24 VITALS — BP 151/69 | HR 65 | Ht 62.0 in | Wt 151.2 lb

## 2017-04-24 DIAGNOSIS — R002 Palpitations: Secondary | ICD-10-CM | POA: Diagnosis not present

## 2017-04-24 DIAGNOSIS — Z8673 Personal history of transient ischemic attack (TIA), and cerebral infarction without residual deficits: Secondary | ICD-10-CM | POA: Diagnosis not present

## 2017-04-24 DIAGNOSIS — I6523 Occlusion and stenosis of bilateral carotid arteries: Secondary | ICD-10-CM | POA: Diagnosis not present

## 2017-04-24 DIAGNOSIS — G459 Transient cerebral ischemic attack, unspecified: Secondary | ICD-10-CM | POA: Diagnosis not present

## 2017-04-24 DIAGNOSIS — E785 Hyperlipidemia, unspecified: Secondary | ICD-10-CM

## 2017-04-24 DIAGNOSIS — I10 Essential (primary) hypertension: Secondary | ICD-10-CM | POA: Diagnosis not present

## 2017-04-24 DIAGNOSIS — Z959 Presence of cardiac and vascular implant and graft, unspecified: Secondary | ICD-10-CM

## 2017-04-24 NOTE — Patient Instructions (Signed)
Medication Instructions:  Continue all current medications.  Labwork: none  Testing/Procedures: none  Follow-Up: As needed.    Any Other Special Instructions Will Be Listed Below (If Applicable).  If you need a refill on your cardiac medications before your next appointment, please call your pharmacy.  

## 2017-04-24 NOTE — Progress Notes (Signed)
SUBJECTIVE: Patient presents for follow-up after being hospitalized for a stroke and ultimately underwent stenting of the right internal carotid artery.  She has a chronic left common carotid occlusion despite prior subclavian to carotid bypass.  As per the electronic medical record, she has a history of coronary artery disease with stenting in 2007, hypertension, and hyperlipidemia.  However, we contacted the cardiac catheterization laboratory at [facility] and it appears the electronic medical record is incorrect and that she has never undergone cardiac catheterization nor has a history of coronary artery stenting or MI.  Echocardiogram demonstrated normal left ventricular systolic and diastolic function with normal regional wall motion, LVEF 60-65%, with moderate LVH.  She is doing well and denies chest pain.  Since her dose of carvedilol was increased she has not had any palpitations.  She has quit smoking.  Today was her first day driving.  She was a bit anxious about this.  Blood pressure is elevated at present, 151/69, but she just took her medications.    Review of Systems: As per "subjective", otherwise negative.  Allergies  Allergen Reactions  . Penicillins Hives and Swelling    PATIENT HAS HAD A PCN REACTION WITH IMMEDIATE RASH, FACIAL/TONGUE/THROAT SWELLING, SOB, OR LIGHTHEADEDNESS WITH HYPOTENSION:  #  #  #  YES  #  #  #   Has patient had a PCN reaction causing severe rash involving mucus membranes or skin necrosis: Unknown Has patient had a PCN reaction that required hospitalization: No Has patient had a PCN reaction occurring within the last 10 years: No     Current Outpatient Medications  Medication Sig Dispense Refill  . albuterol (PROVENTIL HFA;VENTOLIN HFA) 108 (90 Base) MCG/ACT inhaler Inhale 2 puffs into the lungs every 6 (six) hours as needed for wheezing or shortness of breath. 1 Inhaler 6  . amLODipine (NORVASC) 10 MG tablet Take 1 tablet (10 mg  total) by mouth daily. 30 tablet 3  . aspirin EC 325 MG EC tablet Take 1 tablet (325 mg total) by mouth daily. 30 tablet 0  . bisacodyl (DULCOLAX) 5 MG EC tablet Take 1 tablet (5 mg total) by mouth daily as needed for moderate constipation. 30 tablet 0  . carvedilol (COREG) 12.5 MG tablet Take 1 tablet (12.5 mg total) by mouth 2 (two) times daily with a meal. 60 tablet 2  . clopidogrel (PLAVIX) 75 MG tablet Take 1 tablet (75 mg total) by mouth daily. 30 tablet 2  . clotrimazole-betamethasone (LOTRISONE) lotion APPLY TOPICALLY TWO TIMES DAILY (Patient taking differently: APPLY TOPICALLY TWO TIMES DAILY AS NEEDED FOR SKIN IRRITATION) 30 mL 0  . famotidine (PEPCID) 20 MG tablet Take 2 tablets (40 mg total) by mouth daily. (Patient taking differently: Take 40 mg by mouth every evening. ) 180 tablet 3  . fluticasone (FLONASE) 50 MCG/ACT nasal spray Place 1 spray into both nostrils 2 (two) times daily. (Patient taking differently: Place 1 spray into both nostrils daily as needed for allergies. ) 16 g 11  . hydrocortisone (ANUSOL-HC) 2.5 % rectal cream Place 1 application rectally 2 (two) times daily. (Patient taking differently: Place 1 application rectally 2 (two) times daily as needed (IRRITATION). ) 30 g 0  . metFORMIN (GLUCOPHAGE-XR) 500 MG 24 hr tablet Take 1 tablet (500 mg total) by mouth 2 (two) times daily. 180 tablet 3  . mupirocin cream (BACTROBAN) 2 % Apply 1 application topically 2 (two) times daily. (Patient taking differently: Apply 1 application topically 2 (  two) times daily as needed (pressure callus). ) 15 g 0  . nicotine polacrilex (NICORETTE) 4 MG gum Take 4 mg by mouth as needed for smoking cessation.    Marland Kitchen oxyCODONE (OXY IR/ROXICODONE) 5 MG immediate release tablet Take 1 tablet (5 mg total) by mouth every 6 (six) hours as needed for moderate pain. 8 tablet 0  . pravastatin (PRAVACHOL) 20 MG tablet Take 1 tablet (20 mg total) by mouth daily. 30 tablet 11   No current  facility-administered medications for this visit.     Past Medical History:  Diagnosis Date  . Anemia   . Aneurysm of artery ([place])   . Blood transfusion   . Bronchitis    "have had a couple of times in my lifetime"  . Cancer ([place]) 1985   cervical  . Cataract   . CKD (chronic kidney disease) stage 1, GFR 90 ml/min or greater was told by her PCP   Intolerant to ACE according to her PCP  . Diabetes mellitus    tyle 2 diabetic  . Dyspnea   . Dysrhythmia    :"SKIPS A BEAT "   SCHEDULED FOR STRESS TEST 2019  . GERD (gastroesophageal reflux disease)   . Hemorrhoids   . High cholesterol   . History of hiatal hernia   . History of kidney stones   . Hyperlipidemia   . Hypertension   . Polio 1953  . Poliomyelitis since 1948   has drop foot on R  . Stroke [facility]) 2007 affected R side  . Vertigo now improved    Past Surgical History:  Procedure Laterality Date  . CAROTID ENDARTERECTOMY Left 2000 L side  . CERVICAL CONIZATION W/BX  1985  . COLONOSCOPY    . CORONARY ANGIOPLASTY  stented 2007  . DILATION AND CURETTAGE OF UTERUS  1980's  . foot reconstructjion  1953   right foot; post polio  . IR ANGIO EXTERNAL CAROTID SEL EXT CAROTID UNI R MOD SED  03/06/2017  . IR ANGIO INTRA EXTRACRAN SEL COM CAROTID INNOMINATE UNI R MOD SED  03/24/2017  . IR ANGIO VERTEBRAL SEL SUBCLAVIAN INNOMINATE BILAT MOD SED  03/09/2017  . IR ANGIO VERTEBRAL SEL VERTEBRAL BILAT MOD SED  03/06/2017  . IR RADIOLOGIST EVAL & MGMT  07/21/2016  . RADIOLOGY WITH ANESTHESIA N/A 03/24/2017   Procedure: STENTING;  Surgeon: Luanne Bras, MD;  Location: [place];  Service: Radiology;  Laterality: N/A;  . TONSILLECTOMY  1954    Social History   Socioeconomic History  . Marital status: Divorced    Spouse name: Not on file  . Number of children: Not on file  . Years of education: Not on file  . Highest education level: Not on file  Social Needs  . Financial resource strain: Not on file  . Food insecurity -  worry: Not on file  . Food insecurity - inability: Not on file  . Transportation needs - medical: Not on file  . Transportation needs - non-medical: Not on file  Occupational History  . Not on file  Tobacco Use  . Smoking status: Former Smoker    Packs/day: 0.25    Years: 50.00    Pack years: 12.50    Types: Cigarettes    Last attempt to quit: 03/03/2017    Years since quitting: 0.1  . Smokeless tobacco: Never Used  . Tobacco comment: "working hard to quit"  Substance and Sexual Activity  . Alcohol use: No  . Drug use: No  .  Sexual activity: No    Birth control/protection: Post-menopausal  Other Topics Concern  . Not on file  Social History Narrative  . Not on file     Vitals:   04/24/17 1055  BP: (!) 151/69  Pulse: 65  Weight: 151 lb 3.2 oz (68.6 kg)  Height: 5\' 2"  (1.575 m)    Wt Readings from Last 3 Encounters:  04/24/17 151 lb 3.2 oz (68.6 kg)  04/23/17 149 lb (67.6 kg)  04/11/17 152 lb 5.4 oz (69.1 kg)     PHYSICAL EXAM General: NAD HEENT: Normal. Neck: No JVD, no thyromegaly. Lungs: Clear to auscultation bilaterally with normal respiratory effort. CV: Regular rate and rhythm, normal S1/S2, no S3/S4, no murmur. No pretibial or periankle edema.  No carotid bruit.   Abdomen: Soft, nontender, no distention.  Neurologic: Alert and oriented.  Psych: Normal affect. Skin: Normal. Musculoskeletal: No gross deformities.    ECG: Most recent ECG reviewed.   Labs: Lab Results  Component Value Date/Time   K 3.9 04/11/2017 03:35 AM   BUN 17 04/11/2017 03:35 AM   BUN 23 02/19/2017 12:28 PM   CREATININE 1.40 (H) 04/11/2017 03:35 AM   ALT 13 (L) 04/09/2017 03:39 PM   TSH 4.470 02/19/2017 12:28 PM   HGB 11.4 (L) 04/11/2017 03:35 AM   HGB 13.5 02/19/2017 12:28 PM     Lipids: Lab Results  Component Value Date/Time   LDLCALC 83 03/04/2017 04:54 AM   LDLCALC 80 02/19/2017 12:28 PM   CHOL 152 03/04/2017 04:54 AM   CHOL 161 02/19/2017 12:28 PM   TRIG 139  03/04/2017 04:54 AM   HDL 41 03/04/2017 04:54 AM   HDL 48 02/19/2017 12:28 PM       ASSESSMENT AND PLAN:  1.  Palpitations: Symptomatically stable. Continue carvedilol at present dose.  She is also on aspirin and Plavix.  If there is significant recurrence, I would consider a 30-day event monitor.  2.  Coronary artery disease with prior history of stenting: We contacted the cardiac catheterization laboratory at [facility] and it appears the electronic medical record is incorrect and that she has never undergone cardiac catheterization nor has a history of coronary artery stenting or MI.  She is currently on aspirin, carvedilol, and pravastatin.  She is also on Plavix for recent neurologic ischemia and severe right internal carotid artery stenosis s/p stent.  3.  Hypertension: This remains elevated.  She has chronic kidney disease with most recent creatinine of 1.4 on 04/11/17.  ACE inhibitors and angiotensin receptor blockers are indicated and chronic kidney disease up to stage III.  She just took her medications.  If it remains elevated, I would recommend lisinopril 5 mg daily.  4.  Hyperlipidemia: Lipids previously reviewed.  She is on pravastatin 20 mg.  I would recommend switching to Lipitor 40 mg given her history of CVA and peripheral vascular disease.  5.  CVA: Please see discussion of etiology and she is on aspirin, Plavix, and pravastatin.  6.  Severe right internal carotid artery stenosis: She is s/p right carotid artery stenting.  7.  Fatigue: Symptoms have improved since undergoing right internal carotid artery stenting.   Disposition: Follow up as needed   Kate Sable, M.D., F.A.C.C.

## 2017-04-27 DIAGNOSIS — I69328 Other speech and language deficits following cerebral infarction: Secondary | ICD-10-CM | POA: Diagnosis not present

## 2017-04-27 DIAGNOSIS — I69398 Other sequelae of cerebral infarction: Secondary | ICD-10-CM | POA: Diagnosis not present

## 2017-04-27 DIAGNOSIS — I69392 Facial weakness following cerebral infarction: Secondary | ICD-10-CM | POA: Diagnosis not present

## 2017-04-27 DIAGNOSIS — I69352 Hemiplegia and hemiparesis following cerebral infarction affecting left dominant side: Secondary | ICD-10-CM | POA: Diagnosis not present

## 2017-04-27 DIAGNOSIS — H539 Unspecified visual disturbance: Secondary | ICD-10-CM | POA: Diagnosis not present

## 2017-04-28 NOTE — Addendum Note (Signed)
Addended by: Lianne Cure A on: 04/28/2017 02:53 PM   Modules accepted: Orders

## 2017-05-01 DIAGNOSIS — I69392 Facial weakness following cerebral infarction: Secondary | ICD-10-CM | POA: Diagnosis not present

## 2017-05-01 DIAGNOSIS — H539 Unspecified visual disturbance: Secondary | ICD-10-CM | POA: Diagnosis not present

## 2017-05-01 DIAGNOSIS — I69398 Other sequelae of cerebral infarction: Secondary | ICD-10-CM | POA: Diagnosis not present

## 2017-05-01 DIAGNOSIS — I69352 Hemiplegia and hemiparesis following cerebral infarction affecting left dominant side: Secondary | ICD-10-CM | POA: Diagnosis not present

## 2017-05-01 DIAGNOSIS — I69328 Other speech and language deficits following cerebral infarction: Secondary | ICD-10-CM | POA: Diagnosis not present

## 2017-05-05 ENCOUNTER — Telehealth (HOSPITAL_COMMUNITY): Payer: Self-pay

## 2017-05-05 DIAGNOSIS — H539 Unspecified visual disturbance: Secondary | ICD-10-CM | POA: Diagnosis not present

## 2017-05-05 DIAGNOSIS — I69328 Other speech and language deficits following cerebral infarction: Secondary | ICD-10-CM | POA: Diagnosis not present

## 2017-05-05 DIAGNOSIS — I69392 Facial weakness following cerebral infarction: Secondary | ICD-10-CM | POA: Diagnosis not present

## 2017-05-05 DIAGNOSIS — I69352 Hemiplegia and hemiparesis following cerebral infarction affecting left dominant side: Secondary | ICD-10-CM | POA: Diagnosis not present

## 2017-05-05 DIAGNOSIS — I69398 Other sequelae of cerebral infarction: Secondary | ICD-10-CM | POA: Diagnosis not present

## 2017-05-05 NOTE — Telephone Encounter (Signed)
Pt called to inform Dr. Estanislado Pandy that she went to [facility] and they discussed her having a TCAR stenting. She wanted to know if he agreed with this procedure. She was supposed to be treated by Dr. Estanislado Pandy on 03/24/17 but ended up just doing an angio because it was not safe to go forward. Dr. Estanislado Pandy told the pt that he was planning on doing her f/u in April. He stated that this was a pretty new procedure as the pt had learned from [facility] but if that is something that she wanted to do it will be up to her. The risks would still be very high as with his procedure but she would have to make the best decision for her. AW

## 2017-05-07 DIAGNOSIS — I69352 Hemiplegia and hemiparesis following cerebral infarction affecting left dominant side: Secondary | ICD-10-CM | POA: Diagnosis not present

## 2017-05-07 DIAGNOSIS — I69398 Other sequelae of cerebral infarction: Secondary | ICD-10-CM | POA: Diagnosis not present

## 2017-05-07 DIAGNOSIS — I69328 Other speech and language deficits following cerebral infarction: Secondary | ICD-10-CM | POA: Diagnosis not present

## 2017-05-07 DIAGNOSIS — I69392 Facial weakness following cerebral infarction: Secondary | ICD-10-CM | POA: Diagnosis not present

## 2017-05-07 DIAGNOSIS — H539 Unspecified visual disturbance: Secondary | ICD-10-CM | POA: Diagnosis not present

## 2017-05-12 DIAGNOSIS — I69352 Hemiplegia and hemiparesis following cerebral infarction affecting left dominant side: Secondary | ICD-10-CM | POA: Diagnosis not present

## 2017-05-12 DIAGNOSIS — I69392 Facial weakness following cerebral infarction: Secondary | ICD-10-CM | POA: Diagnosis not present

## 2017-05-12 DIAGNOSIS — I69398 Other sequelae of cerebral infarction: Secondary | ICD-10-CM | POA: Diagnosis not present

## 2017-05-12 DIAGNOSIS — I69328 Other speech and language deficits following cerebral infarction: Secondary | ICD-10-CM | POA: Diagnosis not present

## 2017-05-12 DIAGNOSIS — H539 Unspecified visual disturbance: Secondary | ICD-10-CM | POA: Diagnosis not present

## 2017-05-14 DIAGNOSIS — I69328 Other speech and language deficits following cerebral infarction: Secondary | ICD-10-CM | POA: Diagnosis not present

## 2017-05-14 DIAGNOSIS — H539 Unspecified visual disturbance: Secondary | ICD-10-CM | POA: Diagnosis not present

## 2017-05-14 DIAGNOSIS — I69398 Other sequelae of cerebral infarction: Secondary | ICD-10-CM | POA: Diagnosis not present

## 2017-05-14 DIAGNOSIS — I69352 Hemiplegia and hemiparesis following cerebral infarction affecting left dominant side: Secondary | ICD-10-CM | POA: Diagnosis not present

## 2017-05-14 DIAGNOSIS — I69392 Facial weakness following cerebral infarction: Secondary | ICD-10-CM | POA: Diagnosis not present

## 2017-05-18 DIAGNOSIS — I69328 Other speech and language deficits following cerebral infarction: Secondary | ICD-10-CM | POA: Diagnosis not present

## 2017-05-18 DIAGNOSIS — I69392 Facial weakness following cerebral infarction: Secondary | ICD-10-CM | POA: Diagnosis not present

## 2017-05-18 DIAGNOSIS — I69352 Hemiplegia and hemiparesis following cerebral infarction affecting left dominant side: Secondary | ICD-10-CM | POA: Diagnosis not present

## 2017-05-18 DIAGNOSIS — I69398 Other sequelae of cerebral infarction: Secondary | ICD-10-CM | POA: Diagnosis not present

## 2017-05-18 DIAGNOSIS — H539 Unspecified visual disturbance: Secondary | ICD-10-CM | POA: Diagnosis not present

## 2017-05-22 DIAGNOSIS — I69352 Hemiplegia and hemiparesis following cerebral infarction affecting left dominant side: Secondary | ICD-10-CM | POA: Diagnosis not present

## 2017-05-22 DIAGNOSIS — H539 Unspecified visual disturbance: Secondary | ICD-10-CM | POA: Diagnosis not present

## 2017-05-22 DIAGNOSIS — I69398 Other sequelae of cerebral infarction: Secondary | ICD-10-CM | POA: Diagnosis not present

## 2017-05-22 DIAGNOSIS — I69392 Facial weakness following cerebral infarction: Secondary | ICD-10-CM | POA: Diagnosis not present

## 2017-05-22 DIAGNOSIS — I69328 Other speech and language deficits following cerebral infarction: Secondary | ICD-10-CM | POA: Diagnosis not present

## 2017-05-25 DIAGNOSIS — I69328 Other speech and language deficits following cerebral infarction: Secondary | ICD-10-CM | POA: Diagnosis not present

## 2017-05-25 DIAGNOSIS — I69398 Other sequelae of cerebral infarction: Secondary | ICD-10-CM | POA: Diagnosis not present

## 2017-05-25 DIAGNOSIS — H539 Unspecified visual disturbance: Secondary | ICD-10-CM | POA: Diagnosis not present

## 2017-05-25 DIAGNOSIS — I69352 Hemiplegia and hemiparesis following cerebral infarction affecting left dominant side: Secondary | ICD-10-CM | POA: Diagnosis not present

## 2017-05-25 DIAGNOSIS — I69392 Facial weakness following cerebral infarction: Secondary | ICD-10-CM | POA: Diagnosis not present

## 2017-05-27 DIAGNOSIS — N183 Chronic kidney disease, stage 3 (moderate): Secondary | ICD-10-CM | POA: Diagnosis not present

## 2017-05-27 DIAGNOSIS — N2581 Secondary hyperparathyroidism of renal origin: Secondary | ICD-10-CM | POA: Diagnosis not present

## 2017-05-27 DIAGNOSIS — D631 Anemia in chronic kidney disease: Secondary | ICD-10-CM | POA: Diagnosis not present

## 2017-05-27 DIAGNOSIS — E1122 Type 2 diabetes mellitus with diabetic chronic kidney disease: Secondary | ICD-10-CM | POA: Diagnosis not present

## 2017-05-27 DIAGNOSIS — I129 Hypertensive chronic kidney disease with stage 1 through stage 4 chronic kidney disease, or unspecified chronic kidney disease: Secondary | ICD-10-CM | POA: Diagnosis not present

## 2017-05-29 DIAGNOSIS — I69392 Facial weakness following cerebral infarction: Secondary | ICD-10-CM | POA: Diagnosis not present

## 2017-05-29 DIAGNOSIS — I69352 Hemiplegia and hemiparesis following cerebral infarction affecting left dominant side: Secondary | ICD-10-CM | POA: Diagnosis not present

## 2017-05-29 DIAGNOSIS — I69398 Other sequelae of cerebral infarction: Secondary | ICD-10-CM | POA: Diagnosis not present

## 2017-05-29 DIAGNOSIS — H539 Unspecified visual disturbance: Secondary | ICD-10-CM | POA: Diagnosis not present

## 2017-05-29 DIAGNOSIS — I69328 Other speech and language deficits following cerebral infarction: Secondary | ICD-10-CM | POA: Diagnosis not present

## 2017-06-02 ENCOUNTER — Ambulatory Visit: Payer: Medicare Other | Admitting: Neurology

## 2017-06-02 ENCOUNTER — Encounter (INDEPENDENT_AMBULATORY_CARE_PROVIDER_SITE_OTHER): Payer: Self-pay

## 2017-06-02 ENCOUNTER — Encounter: Payer: Self-pay | Admitting: Neurology

## 2017-06-02 VITALS — BP 150/72 | HR 90 | Ht 62.0 in | Wt 152.0 lb

## 2017-06-02 DIAGNOSIS — R002 Palpitations: Secondary | ICD-10-CM

## 2017-06-02 DIAGNOSIS — I63239 Cerebral infarction due to unspecified occlusion or stenosis of unspecified carotid arteries: Secondary | ICD-10-CM

## 2017-06-02 DIAGNOSIS — I729 Aneurysm of unspecified site: Secondary | ICD-10-CM

## 2017-06-02 DIAGNOSIS — I6340 Cerebral infarction due to embolism of unspecified cerebral artery: Secondary | ICD-10-CM

## 2017-06-02 MED ORDER — CLOPIDOGREL BISULFATE 75 MG PO TABS: 75.0000 mg | ORAL_TABLET | Freq: Every day | ORAL | 3 refills | Status: DC

## 2017-06-02 NOTE — Patient Instructions (Addendum)
-   continue ASA and plavix and pravastatin for stroke prevention  - check BP and glucose at home and record. BP goal 130-150 - continue follow up with Dr. Oneida Alar and Dr. Estanislado Pandy and cardiologist - follow up with Dr. Estanislado Pandy for MRA repeat in 3 months.  - if palpitation continues, recommend cardiac monitoring - Follow up with your primary care physician for stroke risk factor modification. Recommend maintain blood pressure goal <130/80, diabetes with hemoglobin A1c goal below 7.0% and lipids with LDL cholesterol goal below 70 mg/dL.  - diabetic diet and regular exercise. Avoid fall - continue PT/OT.  - follow up in 4 months.

## 2017-06-03 DIAGNOSIS — I69392 Facial weakness following cerebral infarction: Secondary | ICD-10-CM | POA: Diagnosis not present

## 2017-06-03 DIAGNOSIS — H539 Unspecified visual disturbance: Secondary | ICD-10-CM | POA: Diagnosis not present

## 2017-06-03 DIAGNOSIS — I69328 Other speech and language deficits following cerebral infarction: Secondary | ICD-10-CM | POA: Diagnosis not present

## 2017-06-03 DIAGNOSIS — I69398 Other sequelae of cerebral infarction: Secondary | ICD-10-CM | POA: Diagnosis not present

## 2017-06-03 DIAGNOSIS — I69352 Hemiplegia and hemiparesis following cerebral infarction affecting left dominant side: Secondary | ICD-10-CM | POA: Diagnosis not present

## 2017-06-03 NOTE — Progress Notes (Signed)
STROKE NEUROLOGY FOLLOW UP NOTE  NAME: Paula Watson DOB: 1945/07/11  REASON FOR VISIT: stroke follow up HISTORY FROM: pt and chart  Today we had the pleasure of seeing Paula Watson in follow-up at our Neurology Clinic. Pt was accompanied by daughter.   History Summary Paula Watson is a 72 y.o. female with history of CKD, CAD, DM, HLD, HTN, polio, TIA, known stable BA tip aneurysm admitted on 03/03/17 for recurrent left arm and facial weakness, slurry speech. TPA given.  MRI showed 4 punctate infarcts within both frontal, right parietal and left temporal.  MRA head showed 3 mm BA tip stable aneurysm.  CT head and neck showed high-grade stenosis of right ICA and occluded left subclavian to CCA bypass with left ICA > 75% stenosis, and stable BA aneurysm.  DSA showed occluded previous left subclavian to left CCA graft.  90% right ICA proximal stenosis.  50-60% left ICA cavernous segment stenosis.  2D echo EF 60-65%.  LDL 83 and A1c 6.4.  Symptoms resolved. Paula Watson was discharged with dual antiplatelet with aspirin and Plavix as well as pravastatin 20 and to follow-up with [facility] and Dr. Estanislado Pandy.  Regarding her carotid stenosis:  CUS 10/2005 right ICA patent  02/2011 transient right arm weakness - MRI neg, CUS showed right ICA 40-59% stenosis, EF 60-65% - ASA changed to plavix  CUS 08/2014 right ICA 50-69% stenosis but could be underestimating due to plaque  Right ICA 90% stenosis on CTA this time.  Left subclavian to CCA bypass in 2000  10/2005 underwent left subclavian artery angioplasty due to stenosis  08/2014 CUS showed bypass was patent with low resistance antegrade flow   11/2018CTA showed bypass closed.  02/2017 Cerebral angio Occluded previous Lt subclavian to Lt CCA graft. 90 % plus RT ICA prox stenosis. 50 to 60 % stenosis of Lt ICA cavernous seg stenosis  04/2017 CUS patent right carotid stent with limited visualization of the proximal segment due to calcified plaque.   Known occlusion of the left CCA bypass.  Right ICA stent attempted by Dr. Estanislado Pandy in 03/2017 but not successful due to complex irregular soft/calcified plaque just proximal to the right ICA stenosis.  Vascular surgery consulted, and performed TCAR right carotid stent on 04/10/17.    Paula Watson also complained of frequent heart palpitations PTA, follow with PCP, and put on Coreg as outpatient.  Paula Watson was referred to cardiology as outpatient after discharge.  However, Paula Watson stated that since the surgery, her palpitation essentially gone.  Her cardiology will consider cardiac monitoring if palpitations are recurrent.  Interval History During the interval time, the patient has been doing well.  No recurrent stroke like symptoms.  Paula Watson walks with a cane for safety.  Still on aspirin and Plavix without side effect.  Follow with Dr. Oneida Alar in clinic, doing well, had CUS in 04/2017 showed patent right carotid stent and known left CCA bypass occlusion.  Paula Watson will have again CUS in July for monitoring.  BP and glucose controlled well at home BP today 150/72.  Close to the end of outpatient PT/OT.  REVIEW OF SYSTEMS: Full 14 system review of systems performed and notable only for those listed below and in HPI above, all others are negative:  Constitutional: Chills Cardiovascular:  Ear/Nose/Throat:   Skin:  Eyes:   Respiratory: SOB Gastroitestinal: Constipation, nausea Genitourinary: Frequent urination, urgency Hematology/Lymphatic:   Endocrine:  Musculoskeletal: Joint pain, back pain, aching muscles, walking difficulty Allergy/Immunology:   Neurological:   Psychiatric:  Depression Sleep: Daytime sleepiness  The following represents the patient's updated allergies and side effects list: Allergies  Allergen Reactions  . Penicillins Hives and Swelling    PATIENT HAS HAD A PCN REACTION WITH IMMEDIATE RASH, FACIAL/TONGUE/THROAT SWELLING, SOB, OR LIGHTHEADEDNESS WITH HYPOTENSION:  #  #  #  YES  #  #  #   Has  patient had a PCN reaction causing severe rash involving mucus membranes or skin necrosis: Unknown Has patient had a PCN reaction that required hospitalization: No Has patient had a PCN reaction occurring within the last 10 years: No     The neurologically relevant items on the patient's problem list were reviewed on today's visit.  Neurologic Examination  A problem focused neurological exam (12 or more points of the single system neurologic examination, vital signs counts as 1 point, cranial nerves count for 8 points) was performed.  Blood pressure (!) 150/72, pulse 90, height 5\' 2"  (1.575 m), weight 152 lb (68.9 kg).  General - Well nourished, well developed, in no apparent distress.  Ophthalmologic - fundi not visualized due to noncooperation.  Cardiovascular - Regular rate and rhythm with no murmur.  Mental Status -  Level of arousal and orientation to time, place, and person were intact. Language including expression, naming, repetition, comprehension was assessed and found intact. Fund of Knowledge was assessed and was intact.  Cranial Nerves II - XII - II - Visual field intact OU. III, IV, VI - Extraocular movements intact. V - Facial sensation intact bilaterally. VII - Facial movement intact bilaterally. VIII - Hearing & vestibular intact bilaterally. X - Palate elevates symmetrically. XI - Chin turning & shoulder shrug intact bilaterally. XII - Tongue protrusion intact.  Motor Strength - The patient's strength was normal in all extremities except right foot 3/5 PF and DF due to hx of polio and pronator drift was absent.  Bulk was normal and fasciculations were absent.   Motor Tone - Muscle tone was assessed at the neck and appendages and was normal.  Reflexes - The patient's reflexes were symmetrical in all extremities and Paula Watson had no pathological reflexes.  Sensory - Light touch, temperature/pinprick were assessed and were symmetrical.    Coordination - The  patient had normal movements in the hands with no ataxia or dysmetria.  Tremor was absent.  Gait and Station -walk with cane, stable no fall   Functional score  mRS = 2   0 - No symptoms.   1 - No significant disability. Able to carry out all usual activities, despite some symptoms.   2 - Slight disability. Able to look after own affairs without assistance, but unable to carry out all previous activities.   3 - Moderate disability. Requires some help, but able to walk unassisted.   4 - Moderately severe disability. Unable to attend to own bodily needs without assistance, and unable to walk unassisted.   5 - Severe disability. Requires constant nursing care and attention, bedridden, incontinent.   6 - Dead.   NIH Stroke Scale = 0   Data reviewed: I personally reviewed the images and agree with the radiology interpretations.  Mri brain and Mra Head and neck Wo Contrast 03/04/2017 IMPRESSION:  1. Multiple punctate foci of acute subcortical ischemia within both frontal lobes, the right parietal lobe and left temporal lobe. No large territory infarct. The distribution of lesions is suggestive of a cardiac or aortic embolic source.  2. Unchanged moderate to severe multifocal narrowing of the internal carotid arteries,  worst at the left clinoid segment.  3. 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  4. Unchanged loss of flow related enhancement of the distal right vertebral artery. MRA of the neck shows no flow related enhancement along the cervical segments of the right vertebral artery. This may indicate occlusion, but the MRA is degraded by the lack of IV contrast. This could be further characterized with CTA of the neck or ultrasound.  5. Chronic occlusion of the left common carotid artery, status post subclavian to carotid bypass. The cervical portions of the internal carotid arteries are poorly  assessed due to lack of IV contrast. This also could be further characterized with CTA of the neck or ultrasound.   Ct Head Code Stroke Wo Contrast 03/03/2017 IMPRESSION:  1. No acute intracranial abnormality identified.  2. Stable small chronic infarction in right cerebellar hemisphere and chronic lacunar infarcts in basal ganglia.  3. Stable chronic microvascular ischemic changes and parenchymal volume loss of the brain.  4. ASPECTS is 10.   Ct Angio Head and neck W Or Wo Contrast 03/04/2017 IMPRESSION:  1. No intracranial large vessel occlusion.  No acute hemorrhage.  2. Proximal right internal carotid artery stenosis measuring 90% secondary to the presence of predominantly calcified plaque.  3. Chronic occlusion of the left common carotid artery at its origin. There is no flow seen within the left subclavian to ICA graft. The left carotid bifurcation and internal and external carotid arteries do show opacification. There is approximately 75% stenosis of the proximal left internal carotid artery. This measurement is likely underestimating the degree of stenosis, because of the small caliber of the distal ICA.  4. Bilateral moderate to severe atherosclerotic narrowing of the internal carotid arteries at the skullbase.  5. 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   Cerebral Angiogram - Dr Estanislado Pandy 03/06/2017 S/P 4 vessel cerebral arteriogram. RT CFA approach. Findings. 1.Occluded previous Lt subclavian to Lt CCA graft. 2.90 % plus RT ICA prox stenosis. 3.Stable 86mm basilar apex and RT PCOM aneurysyms. 4.50 to 60 % stenosis of Lt ICA cavernous seg stenosis. 5.Approx 50 % stenosis of RT MCA M1 seg.  2D Echocardiogram - Left ventricle: The cavity size was normal. Wall thickness was increased in a pattern of moderate LVH. Systolic function was normal. The estimated ejection fraction was in the range of 60% to 65%. Wall motion was normal; there were  no regional wall motion abnormalities. Left ventricular diastolic function parameters were normal. - Atrial septum: No defect or patent foramen ovale was identified. Impressions: - No cardiac source of emboli was indentified.  03/24/17 DSA Approximately 85% stenosis of the right internal carotid artery at the bulb and just distally secondary to a complex irregular soft/calcified plaque. 60% stenosis associated with a kink at the junction of the middle and the proximal 1/3 of the right internal carotid artery. Approximately 50% stenosis of the right middle cerebral artery M1 segment with mild to moderate arteriosclerotic changes involving the right MCA trifurcation branches.  04/2017 CUS  patent right carotid stent with limited visualization of the proximal segment due to calcified plaque.  Known occlusion of the left CCA bypass.  Component     Latest Ref Rng & Units 02/19/2017 03/04/2017 03/11/2017 03/24/2017  Cholesterol, Total     100 - 199 mg/dL 161     Triglycerides     <150 mg/dL 163 (H) 139  HDL Cholesterol     >40 mg/dL 48 41    VLDL Cholesterol Cal     5 - 40 mg/dL 33     LDL (calc)     0 - 99 mg/dL 80 83    Total CHOL/HDL Ratio     RATIO 3.4 3.7    Cholesterol     0 - 200 mg/dL  152    VLDL     0 - 40 mg/dL  28    TSH     0.450 - 4.500 uIU/mL 4.470     Thyroxine (T4)     4.5 - 12.0 ug/dL 7.6     T3 Uptake Ratio     24 - 39 % 27     Free Thyroxine Index     1.2 - 4.9 2.1     Hemoglobin A1C     4.8 - 5.6 %  6.4 (H)    Mean Plasma Glucose     mg/dL  136.98    Platelet Function  P2Y12     194 - 418 PRU   98 (L) 9 (L)   Component     Latest Ref Rng & Units 04/09/2017  Cholesterol, Total     100 - 199 mg/dL   Triglycerides     <150 mg/dL   HDL Cholesterol     >40 mg/dL   VLDL Cholesterol Cal     5 - 40 mg/dL   LDL (calc)     0 - 99 mg/dL   Total CHOL/HDL Ratio     RATIO   Cholesterol     0 - 200 mg/dL   VLDL     0 - 40 mg/dL   TSH      0.450 - 4.500 uIU/mL   Thyroxine (T4)     4.5 - 12.0 ug/dL   T3 Uptake Ratio     24 - 39 %   Free Thyroxine Index     1.2 - 4.9   Hemoglobin A1C     4.8 - 5.6 % 6.3 (H)  Mean Plasma Glucose     mg/dL 134.11  Platelet Function  P2Y12     194 - 418 PRU     Assessment: As you may recall, Paula Watson is a 72 y.o. Caucasian female with PMH of CKD, CAD, DM, HLD, HTN, polio, TIA, known stable BA tip aneurysm admitted on 03/03/17 for recurrent left arm and facial weakness, slurry speech. TPA given.  MRI showed 4 punctate infarcts within both frontal, right parietal and left temporal.  MRA head showed 3 mm BA tip stable aneurysm.  CT head and neck showed high-grade stenosis of right ICA and occluded left subclavian to CCA bypass with left ICA > 75% stenosis, and stable BA aneurysm.  DSA showed occluded previous left subclavian to left CCA graft.  > 90% right ICA proximal stenosis.  50-60% left ICA cavernous segment stenosis.  2D echo EF 60-65%.  LDL 83 and A1c 6.4.  Symptoms resolved. Paula Watson was discharged with dual antiplatelet with aspirin and Plavix as well as pravastatin 20. Right ICA stent attempted by Dr. Estanislado Pandy in 03/2017 but not successful due to complex irregular soft/calcified plaque just proximal to the right ICA stenosis.  Vascular surgery performed TCAR right carotid stent on 04/10/17. Repeat CUS in 04/2016 showed right ICA stent patent.  Her palpitation essentially gone after surgery.  Her cardiology will consider cardiac monitoring if palpitations are recurrent.  Plan:  - continue ASA and plavix and  pravastatin for stroke prevention  - check BP and glucose at home and record. BP goal 130-150 - follow up with Dr. Estanislado Pandy for MRA repeat in 2 months and for stable BA aneurysm.  - follow up with Dr. Oneida Alar to repeat CUS in 10/2017 - if palpitation continues, recommend cardiac monitoring - follow up with cardiology - Follow up with your primary care physician for stroke risk factor modification.  Recommend maintain blood pressure goal <130/80, diabetes with hemoglobin A1c goal below 7.0% and lipids with LDL cholesterol goal below 70 mg/dL.  - diabetic diet and regular exercise. Avoid fall - continue PT/OT - follow up in 4 months.   I spent more than 25 minutes of face to face time with the patient. Greater than 50% of time was spent in counseling and coordination of care. We discussed continued follow up, BP management, refilled plavix.   No orders of the defined types were placed in this encounter.   Meds ordered this encounter  Medications  . clopidogrel (PLAVIX) 75 MG tablet    Sig: Take 1 tablet (75 mg total) by mouth daily.    Dispense:  90 tablet    Refill:  3    Patient Instructions  - continue ASA and plavix and pravastatin for stroke prevention  - check BP and glucose at home and record. BP goal 130-150 - continue follow up with Dr. Oneida Alar and Dr. Estanislado Pandy and cardiologist - follow up with Dr. Estanislado Pandy for MRA repeat in 3 months.  - if palpitation continues, recommend cardiac monitoring - Follow up with your primary care physician for stroke risk factor modification. Recommend maintain blood pressure goal <130/80, diabetes with hemoglobin A1c goal below 7.0% and lipids with LDL cholesterol goal below 70 mg/dL.  - diabetic diet and regular exercise. Avoid fall - continue PT/OT.  - follow up in 4 months.    Rosalin Hawking, MD PhD [facility] [address], [place], [place] [address] [phone]

## 2017-06-05 DIAGNOSIS — I69352 Hemiplegia and hemiparesis following cerebral infarction affecting left dominant side: Secondary | ICD-10-CM | POA: Diagnosis not present

## 2017-06-05 DIAGNOSIS — I69398 Other sequelae of cerebral infarction: Secondary | ICD-10-CM | POA: Diagnosis not present

## 2017-06-05 DIAGNOSIS — H539 Unspecified visual disturbance: Secondary | ICD-10-CM | POA: Diagnosis not present

## 2017-06-05 DIAGNOSIS — I69328 Other speech and language deficits following cerebral infarction: Secondary | ICD-10-CM | POA: Diagnosis not present

## 2017-06-05 DIAGNOSIS — I69392 Facial weakness following cerebral infarction: Secondary | ICD-10-CM | POA: Diagnosis not present

## 2017-06-08 NOTE — Patient Outreach (Signed)
mRs obtained by Dr. Erlinda Hong during office visit on 06/02/2017.  mRs = 2

## 2017-06-11 ENCOUNTER — Other Ambulatory Visit: Payer: Self-pay | Admitting: Physician Assistant

## 2017-06-12 ENCOUNTER — Ambulatory Visit (INDEPENDENT_AMBULATORY_CARE_PROVIDER_SITE_OTHER): Payer: Medicare Other | Admitting: Physician Assistant

## 2017-06-12 ENCOUNTER — Encounter: Payer: Self-pay | Admitting: Physician Assistant

## 2017-06-12 VITALS — BP 130/76 | HR 63 | Temp 97.7°F | Ht 62.0 in | Wt 155.0 lb

## 2017-06-12 DIAGNOSIS — I10 Essential (primary) hypertension: Secondary | ICD-10-CM | POA: Diagnosis not present

## 2017-06-12 DIAGNOSIS — J441 Chronic obstructive pulmonary disease with (acute) exacerbation: Secondary | ICD-10-CM

## 2017-06-12 DIAGNOSIS — R809 Proteinuria, unspecified: Secondary | ICD-10-CM

## 2017-06-12 DIAGNOSIS — K5904 Chronic idiopathic constipation: Secondary | ICD-10-CM | POA: Insufficient documentation

## 2017-06-12 DIAGNOSIS — E1129 Type 2 diabetes mellitus with other diabetic kidney complication: Secondary | ICD-10-CM | POA: Diagnosis not present

## 2017-06-12 DIAGNOSIS — Z0000 Encounter for general adult medical examination without abnormal findings: Secondary | ICD-10-CM | POA: Diagnosis not present

## 2017-06-12 LAB — BAYER DCA HB A1C WAIVED: HB A1C (BAYER DCA - WAIVED): 6.1 % (ref <7.0)

## 2017-06-12 MED ORDER — ALBUTEROL SULFATE HFA 108 (90 BASE) MCG/ACT IN AERS: 2.0000 | INHALATION_SPRAY | Freq: Four times a day (QID) | RESPIRATORY_TRACT | 6 refills | Status: AC | PRN

## 2017-06-12 MED ORDER — LINACLOTIDE 145 MCG PO CAPS: 145.0000 ug | ORAL_CAPSULE | Freq: Every day | ORAL | 11 refills | Status: AC

## 2017-06-12 MED ORDER — CLOTRIMAZOLE-BETAMETHASONE 1-0.05 % EX LOTN: TOPICAL_LOTION | Freq: Two times a day (BID) | CUTANEOUS | 11 refills | Status: DC

## 2017-06-12 MED ORDER — CARVEDILOL 12.5 MG PO TABS: 12.5000 mg | ORAL_TABLET | Freq: Two times a day (BID) | ORAL | 3 refills | Status: DC

## 2017-06-12 NOTE — Progress Notes (Signed)
BP 130/76   Pulse 63   Temp 97.7 F (36.5 C) (Oral)   Ht 5' 2"  (1.575 m)   Wt 155 lb (70.3 kg)   BMI 28.35 kg/m    Subjective:    Patient ID: Paula Watson, female    DOB: 12/05/1945, 72 y.o.   MRN: [id number]  HPI: Paula Watson is a 72 y.o. female presenting on 06/12/2017 for Annual Exam  This patient comes in for annual well physical examination. All medications are reviewed today. There are no reports of any problems with the medications. All of the medical conditions are reviewed and updated.  Lab work is reviewed and will be ordered as medically necessary.   She recently had a revascularization procedure performed on her right carotid artery.  She has recovered very well.  She states she feels very good.  She feels better than she has in a long time.  Still has episodes of vertigo.  She does use a cane.  She occasionally does still fall down.  She is still under the care of cardiology, vascular, interventional radiology, neurology.  Past Medical History:  Diagnosis Date  . Anemia   . Aneurysm of artery ([place])   . Blood transfusion   . Bronchitis    "have had a couple of times in my lifetime"  . Cancer ([place]) 1985   cervical  . Cataract   . CKD (chronic kidney disease) stage 1, GFR 90 ml/min or greater was told by her PCP   Intolerant to ACE according to her PCP  . Diabetes mellitus    tyle 2 diabetic  . Dyspnea   . Dysrhythmia    :"SKIPS A BEAT "   SCHEDULED FOR STRESS TEST 2019  . GERD (gastroesophageal reflux disease)   . Hemorrhoids   . High cholesterol   . History of hiatal hernia   . History of kidney stones   . Hyperlipidemia   . Hypertension   . Polio 1953  . Poliomyelitis since 1948   has drop foot on R  . Stroke [facility]) 2007 affected R side  . Vertigo now improved   Relevant past medical, surgical, family and social history reviewed and updated as indicated. Interim medical history since our last visit reviewed. Allergies and medications reviewed and  updated. DATA REVIEWED: CHART IN EPIC  Family History reviewed for pertinent findings.  Review of Systems  Constitutional: Positive for fatigue. Negative for activity change and fever.  HENT: Negative.   Eyes: Negative.   Respiratory: Negative.  Negative for cough.   Cardiovascular: Negative.  Negative for chest pain.  Gastrointestinal: Negative.  Negative for abdominal pain.  Endocrine: Negative.   Genitourinary: Negative.  Negative for dysuria.  Musculoskeletal: Positive for arthralgias, gait problem and myalgias.  Skin: Negative.   Psychiatric/Behavioral: The patient is nervous/anxious.     Allergies as of 06/12/2017      Reactions   Penicillins Hives, Swelling   PATIENT HAS HAD A PCN REACTION WITH IMMEDIATE RASH, FACIAL/TONGUE/THROAT SWELLING, SOB, OR LIGHTHEADEDNESS WITH HYPOTENSION:  #  #  #  YES  #  #  #   Has patient had a PCN reaction causing severe rash involving mucus membranes or skin necrosis: Unknown Has patient had a PCN reaction that required hospitalization: No Has patient had a PCN reaction occurring within the last 10 years: No      Medication List        Accurate as of 06/12/17  1:58 PM. Always use your  most recent med list.          albuterol 108 (90 Base) MCG/ACT inhaler Commonly known as:  PROVENTIL HFA;VENTOLIN HFA Inhale 2 puffs into the lungs every 6 (six) hours as needed for wheezing or shortness of breath.   amLODipine 10 MG tablet Commonly known as:  NORVASC Take 1 tablet (10 mg total) by mouth daily.   aspirin 325 MG EC tablet Take 1 tablet (325 mg total) by mouth daily.   carvedilol 12.5 MG tablet Commonly known as:  COREG Take 1 tablet (12.5 mg total) by mouth 2 (two) times daily with a meal.   clopidogrel 75 MG tablet Commonly known as:  PLAVIX Take 1 tablet (75 mg total) by mouth daily.   clotrimazole-betamethasone lotion Commonly known as:  LOTRISONE Apply topically 2 (two) times daily.   famotidine 20 MG tablet Commonly known  as:  PEPCID TAKE 2 TABLETS BY MOUTH ONCE DAILY   fluticasone 50 MCG/ACT nasal spray Commonly known as:  FLONASE Place 1 spray into both nostrils 2 (two) times daily.   hydrocortisone 2.5 % rectal cream Commonly known as:  ANUSOL-HC Place 1 application rectally 2 (two) times daily.   linaclotide 145 MCG Caps capsule Commonly known as:  LINZESS Take 1 capsule (145 mcg total) by mouth daily before breakfast.   metFORMIN 500 MG 24 hr tablet Commonly known as:  GLUCOPHAGE-XR Take 1 tablet (500 mg total) by mouth 2 (two) times daily.   mupirocin cream 2 % Commonly known as:  BACTROBAN Apply 1 application topically 2 (two) times daily.   nicotine polacrilex 4 MG gum Commonly known as:  NICORETTE Take 2 mg by mouth as needed for smoking cessation.   pravastatin 20 MG tablet Commonly known as:  PRAVACHOL Take 1 tablet (20 mg total) by mouth daily.          Objective:    BP 130/76   Pulse 63   Temp 97.7 F (36.5 C) (Oral)   Ht 5' 2"  (1.575 m)   Wt 155 lb (70.3 kg)   BMI 28.35 kg/m   Allergies  Allergen Reactions  . Penicillins Hives and Swelling    PATIENT HAS HAD A PCN REACTION WITH IMMEDIATE RASH, FACIAL/TONGUE/THROAT SWELLING, SOB, OR LIGHTHEADEDNESS WITH HYPOTENSION:  #  #  #  YES  #  #  #   Has patient had a PCN reaction causing severe rash involving mucus membranes or skin necrosis: Unknown Has patient had a PCN reaction that required hospitalization: No Has patient had a PCN reaction occurring within the last 10 years: No     Wt Readings from Last 3 Encounters:  06/12/17 155 lb (70.3 kg)  06/02/17 152 lb (68.9 kg)  04/24/17 151 lb 3.2 oz (68.6 kg)    Physical Exam  Constitutional: She is oriented to person, place, and time. She appears well-developed and well-nourished.  HENT:  Head: Normocephalic and atraumatic.  Eyes: Conjunctivae and EOM are normal. Pupils are equal, round, and reactive to light.  Neck: Normal range of motion. Neck supple.    Cardiovascular: Normal rate, regular rhythm, normal heart sounds and intact distal pulses.  Pulmonary/Chest: Effort normal and breath sounds normal. Right breast exhibits no mass, no skin change and no tenderness. Left breast exhibits no mass, no skin change and no tenderness. Breasts are symmetrical.  Abdominal: Soft. Bowel sounds are normal.  Genitourinary: Vagina normal and uterus normal. Rectal exam shows no fissure. No breast swelling, tenderness, discharge or bleeding. There is no  tenderness or lesion on the right labia. There is no tenderness or lesion on the left labia. Uterus is not deviated, not enlarged and not tender. Cervix exhibits no motion tenderness, no discharge and no friability. Right adnexum displays no mass, no tenderness and no fullness. Left adnexum displays no mass, no tenderness and no fullness. No tenderness or bleeding in the vagina. No vaginal discharge found.  Neurological: She is alert and oriented to person, place, and time. She has normal reflexes.  Skin: Skin is warm and dry. No rash noted.  Psychiatric: She has a normal mood and affect. Her behavior is normal. Judgment and thought content normal.  Nursing note and vitals reviewed.       Assessment & Plan:   1. Well adult exam - CMP14+EGFR - Bayer DCA Hb A1c Waived  2. HTN (hypertension), benign - carvedilol (COREG) 12.5 MG tablet; Take 1 tablet (12.5 mg total) by mouth 2 (two) times daily with a meal.  Dispense: 180 tablet; Refill: 3 - CMP14+EGFR  3. COPD exacerbation (HCC) - albuterol (PROVENTIL HFA;VENTOLIN HFA) 108 (90 Base) MCG/ACT inhaler; Inhale 2 puffs into the lungs every 6 (six) hours as needed for wheezing or shortness of breath.  Dispense: 1 Inhaler; Refill: 6  4. Controlled type 2 diabetes mellitus with microalbuminuria, without long-term current use of insulin (HCC)  - Bayer DCA Hb A1c Waived  5. Chronic idiopathic constipation - linaclotide (LINZESS) 145 MCG CAPS capsule; Take 1 capsule  (145 mcg total) by mouth daily before breakfast.  Dispense: 30 capsule; Refill: 11   Continue all other maintenance medications as listed above.  Follow up plan: Return in about 6 months (around 12/13/2017) for recheck.  Educational handout given for [place] PA-C [facility] [address]  [place], [place] [address] [phone]   06/12/2017, 1:58 PM

## 2017-06-12 NOTE — Patient Instructions (Signed)
In a few days you may receive a survey in the mail or online from [employer] regarding your visit with us today. Please take a moment to fill this out. Your feedback is very important to our whole office. It can help us better understand your needs as well as improve your experience and satisfaction. Thank you for taking your time to complete it. We care about you.  Kyre Jeffries, PA-C  

## 2017-06-13 LAB — CMP14+EGFR
ALT: 16 IU/L (ref 0–32)
AST: 21 IU/L (ref 0–40)
Albumin/Globulin Ratio: 1.8 (ref 1.2–2.2)
Albumin: 3.8 g/dL (ref 3.5–4.8)
Alkaline Phosphatase: 84 IU/L (ref 39–117)
BUN/Creatinine Ratio: 19 (ref 12–28)
BUN: 27 mg/dL (ref 8–27)
Bilirubin Total: 0.2 mg/dL (ref 0.0–1.2)
CALCIUM: 9.4 mg/dL (ref 8.7–10.3)
CO2: 23 mmol/L (ref 20–29)
CREATININE: 1.44 mg/dL — AB (ref 0.57–1.00)
Chloride: 104 mmol/L (ref 96–106)
GFR calc Af Amer: 42 mL/min/{1.73_m2} — ABNORMAL LOW (ref >59)
GFR, EST NON AFRICAN AMERICAN: 37 mL/min/{1.73_m2} — AB (ref >59)
GLUCOSE: 169 mg/dL — AB (ref 65–99)
Globulin, Total: 2.1 g/dL (ref 1.5–4.5)
Potassium: 4.5 mmol/L (ref 3.5–5.2)
Sodium: 142 mmol/L (ref 134–144)
Total Protein: 5.9 g/dL — ABNORMAL LOW (ref 6.0–8.5)

## 2017-07-01 ENCOUNTER — Other Ambulatory Visit: Payer: Self-pay | Admitting: Physician Assistant

## 2017-07-01 DIAGNOSIS — R809 Proteinuria, unspecified: Principal | ICD-10-CM

## 2017-07-01 DIAGNOSIS — E1129 Type 2 diabetes mellitus with other diabetic kidney complication: Secondary | ICD-10-CM

## 2017-07-15 ENCOUNTER — Telehealth (HOSPITAL_COMMUNITY): Payer: Self-pay

## 2017-07-15 NOTE — Telephone Encounter (Signed)
Called to schedule f/u mri/mra, no answer, left vm. AW  

## 2017-08-20 ENCOUNTER — Other Ambulatory Visit: Payer: Medicare Other

## 2017-08-20 DIAGNOSIS — N183 Chronic kidney disease, stage 3 (moderate): Secondary | ICD-10-CM | POA: Diagnosis not present

## 2017-08-25 ENCOUNTER — Telehealth (HOSPITAL_COMMUNITY): Payer: Self-pay

## 2017-08-25 DIAGNOSIS — I129 Hypertensive chronic kidney disease with stage 1 through stage 4 chronic kidney disease, or unspecified chronic kidney disease: Secondary | ICD-10-CM | POA: Diagnosis not present

## 2017-08-25 DIAGNOSIS — N183 Chronic kidney disease, stage 3 (moderate): Secondary | ICD-10-CM | POA: Diagnosis not present

## 2017-08-25 DIAGNOSIS — D631 Anemia in chronic kidney disease: Secondary | ICD-10-CM | POA: Diagnosis not present

## 2017-08-25 DIAGNOSIS — N2581 Secondary hyperparathyroidism of renal origin: Secondary | ICD-10-CM | POA: Diagnosis not present

## 2017-08-25 DIAGNOSIS — N3281 Overactive bladder: Secondary | ICD-10-CM | POA: Diagnosis not present

## 2017-08-25 NOTE — Telephone Encounter (Signed)
Called to schedule f/u mri, no answer, left vm. AW 

## 2017-08-26 ENCOUNTER — Other Ambulatory Visit (HOSPITAL_COMMUNITY): Payer: Self-pay | Admitting: Interventional Radiology

## 2017-08-26 DIAGNOSIS — I729 Aneurysm of unspecified site: Secondary | ICD-10-CM

## 2017-09-04 ENCOUNTER — Other Ambulatory Visit (HOSPITAL_COMMUNITY): Payer: Self-pay | Admitting: Interventional Radiology

## 2017-09-04 ENCOUNTER — Encounter (HOSPITAL_COMMUNITY): Payer: Self-pay | Admitting: Radiology

## 2017-09-04 ENCOUNTER — Ambulatory Visit (HOSPITAL_COMMUNITY): Payer: Medicare Other

## 2017-09-04 ENCOUNTER — Ambulatory Visit (HOSPITAL_COMMUNITY)
Admission: RE | Admit: 2017-09-04 | Discharge: 2017-09-04 | Disposition: A | Discharge status: Home / Self Care | Payer: Medicare Other | Source: Ambulatory Visit | Attending: Interventional Radiology | Admitting: Interventional Radiology

## 2017-09-04 DIAGNOSIS — I729 Aneurysm of unspecified site: Secondary | ICD-10-CM

## 2017-09-04 DIAGNOSIS — I6601 Occlusion and stenosis of right middle cerebral artery: Secondary | ICD-10-CM | POA: Diagnosis not present

## 2017-09-04 LAB — POCT I-STAT CREATININE: Creatinine, Ser: 1.8 mg/dL — ABNORMAL HIGH (ref 0.44–1.00)

## 2017-09-10 ENCOUNTER — Other Ambulatory Visit: Payer: Self-pay | Admitting: Physician Assistant

## 2017-09-30 ENCOUNTER — Telehealth (HOSPITAL_COMMUNITY): Payer: Self-pay

## 2017-09-30 NOTE — Telephone Encounter (Signed)
Called regarding recent US mri, no answer, no vm. AW

## 2017-10-01 ENCOUNTER — Ambulatory Visit: Payer: Medicare Other | Admitting: Adult Health

## 2017-10-01 ENCOUNTER — Encounter: Payer: Self-pay | Admitting: Adult Health

## 2017-10-01 VITALS — BP 150/67 | HR 62 | Ht 62.0 in | Wt 163.8 lb

## 2017-10-01 DIAGNOSIS — E1129 Type 2 diabetes mellitus with other diabetic kidney complication: Secondary | ICD-10-CM | POA: Diagnosis not present

## 2017-10-01 DIAGNOSIS — I10 Essential (primary) hypertension: Secondary | ICD-10-CM | POA: Diagnosis not present

## 2017-10-01 DIAGNOSIS — E785 Hyperlipidemia, unspecified: Secondary | ICD-10-CM | POA: Diagnosis not present

## 2017-10-01 DIAGNOSIS — R809 Proteinuria, unspecified: Secondary | ICD-10-CM | POA: Diagnosis not present

## 2017-10-01 DIAGNOSIS — Z8673 Personal history of transient ischemic attack (TIA), and cerebral infarction without residual deficits: Secondary | ICD-10-CM

## 2017-10-01 NOTE — Patient Instructions (Addendum)
Continue aspirin 325 mg daily and clopidogrel 75 mg daily  and pravachol  for secondary stroke prevention   Continue to follow up with PCP regarding cholesterol and blood pressure management   Continue to monitor blood pressure at home  Maintain strict control of hypertension with blood pressure goal below 130/90, diabetes with hemoglobin A1c goal below 6.5% and cholesterol with LDL cholesterol (bad cholesterol) goal below 70 mg/dL. I also advised the patient to eat a healthy diet with plenty of whole grains, cereals, fruits and vegetables, exercise regularly and maintain ideal body weight.  Followup in the future with me in 6 months or call earlier if needed       Thank you for coming to see [place] at [facility]. I hope we have been able to provide you high quality care today.  You may receive a patient satisfaction survey over the next few weeks. We would appreciate your feedback and comments so that we may continue to improve ourselves and the health of our patients.

## 2017-10-01 NOTE — Progress Notes (Signed)
STROKE NEUROLOGY FOLLOW UP NOTE  NAME: Paula Watson DOB: 07-May-1945  REASON FOR VISIT: stroke follow up HISTORY FROM: pt and chart  Today we had the pleasure of seeing Paula Watson in follow-up at our Neurology Clinic. Pt was accompanied by daughter.   History Summary Paula Watson is a 72 y.o. female with history of CKD, CAD, DM, HLD, HTN, polio, TIA, known stable BA tip aneurysm admitted on 03/03/17 for recurrent left arm and facial weakness, slurry speech. TPA given.  MRI showed 4 punctate infarcts within both frontal, right parietal and left temporal.  MRA head showed 3 mm BA tip stable aneurysm.  CT head and neck showed high-grade stenosis of right ICA and occluded left subclavian to CCA bypass with left ICA > 75% stenosis, and stable BA aneurysm.  DSA showed occluded previous left subclavian to left CCA graft.  90% right ICA proximal stenosis. 50-60% left ICA cavernous segment stenosis.  2D echo EF 60-65%.  LDL 83 and A1c 6.4.  Symptoms resolved. She was discharged with dual antiplatelet with aspirin and Plavix as well as pravastatin 20 and to follow-up with [facility] and Dr. Estanislado Pandy. Right ICA stent attempted by Dr. Estanislado Pandy in 03/2017 but not successful due to complex irregular soft/calcified plaque just proximal to the right ICA stenosis.  Vascular surgery consulted, and performed TCAR right carotid stent on 04/10/17.   She also complained of frequent heart palpitations PTA, follow with PCP, and put on Coreg as outpatient.  She was referred to cardiology as outpatient after discharge.  However, she stated that since the surgery, her palpitation essentially gone.  Her cardiology will consider cardiac monitoring if palpitations are recurrent.  06/02/17 VISIT Dr. Erlinda Hong: During the interval time, the patient has been doing well.  No recurrent stroke like symptoms.  She walks with a cane for safety.  Still on aspirin and Plavix without side effect.  Follow with Dr. Oneida Alar in clinic, doing  well, had CUS in 04/2017 showed patent right carotid stent and known left CCA bypass occlusion.  She will have again CUS in July for monitoring.  BP and glucose controlled well at home BP today 150/72.  Close to the end of outpatient PT/OT.  10/01/17 UPDATE: Patient is being seen today for four-month follow-up.  She continues to take both aspirin and Plavix with bruising on her bilateral upper extremities but denies bleeding.  Continues to take pravastatin without side effects of myalgias.  Recent issues of increased swelling in bilateral lower extremities.  Nephrologist placed her on Lasix 20 mg daily along with Vesicare 5 mg as she was waking up consistently throughout the night to void.  She states this has been working for her and now only wakes up 1-2 times per throughout the night.  Patient is very concerned regarding lower extremity swelling and states it is worse in the evening time.  Denies use of compression stockings or raising legs while she is sitting.  Was having complaints previously palpitations but this has since stopped.  Denies new or worsening stroke/TIA symptoms.    REVIEW OF SYSTEMS: Full 14 system review of systems performed and notable only for those listed below and in HPI above, all others are negative:  Activity change, chills, fatigue, unexpected weight change, eye itching, light sensitivity, leg swelling, cold intolerance, rectal bleeding, constipation, nausea, rectal pain, frequent waking, daytime sleepiness, incontinence of bladder, frequency of urination, urgency, joint pain, bruise easily, and dizziness  The following represents the patient's updated  allergies and side effects list: Allergies  Allergen Reactions  . Penicillins Hives and Swelling    PATIENT HAS HAD A PCN REACTION WITH IMMEDIATE RASH, FACIAL/TONGUE/THROAT SWELLING, SOB, OR LIGHTHEADEDNESS WITH HYPOTENSION:  #  #  #  YES  #  #  #   Has patient had a PCN reaction causing severe rash involving mucus membranes  or skin necrosis: Unknown Has patient had a PCN reaction that required hospitalization: No Has patient had a PCN reaction occurring within the last 10 years: No     The neurologically relevant items on the patient's problem list were reviewed on today's visit.  Neurologic Examination  A problem focused neurological exam (12 or more points of the single system neurologic examination, vital signs counts as 1 point, cranial nerves count for 8 points) was performed.  Blood pressure (!) 150/67, pulse 62, height 5\' 2"  (1.575 m), weight 163 lb 12.8 oz (74.3 kg).  General - Well nourished, well developed, in no apparent distress.  Ophthalmologic - fundi not visualized due to noncooperation.  Cardiovascular - Regular rate and rhythm with no murmur; bilateral lower extremity +2 pitting edema  Mental Status -  Level of arousal and orientation to time, place, and person were intact. Language including expression, naming, repetition, comprehension was assessed and found intact. Fund of Knowledge was assessed and was intact.  Cranial Nerves II - XII - II - Visual field intact OU. III, IV, VI - Extraocular movements intact. V - Facial sensation intact bilaterally. VII - Facial movement intact bilaterally. VIII - Hearing & vestibular intact bilaterally. X - Palate elevates symmetrically. XI - Chin turning & shoulder shrug intact bilaterally. XII - Tongue protrusion intact.  Motor Strength - The patient's strength was normal in all extremities except right foot 3/5 PF and DF due to hx of polio with brace intact and pronator drift was absent.  Bulk was normal and fasciculations were absent.   Motor Tone - Muscle tone was assessed at the neck and appendages and was normal.  Reflexes - The patient's reflexes were symmetrical in all extremities and she had no pathological reflexes.  Sensory - Light touch, temperature/pinprick were assessed and were symmetrical.    Coordination - The  patient had normal movements in the hands with no ataxia or dysmetria.  Tremor was absent.  Gait and Station -walk with cane, stable no fall      Data reviewed: I personally reviewed the images and agree with the radiology interpretations.  VAS US CAROTID DUPLEX BILATERAL 04/23/17 Impression: Patent right carotid stent with limited visualization of proximal segment.  Known occlusion of left common carotid artery bypass.  MR MRA HEAD WO CONTRAST MR BRAIN WO CONTRAST 09/04/2017 IMPRESSION: MRI HEAD IMPRESSION: 1. No acute intracranial abnormality. 2. Stable appearance of the brain with mild chronic small vessel ischemic change with scattered remote lacunar infarcts involving the bilateral basal ganglia, left thalamus, with additional small remote right cerebellar infarcts. MRA HEAD IMPRESSION: 1. Stable intracranial MRA. Multifocal stenoses involving the cavernous ICAs, measuring up to 25% on the right and 50% on the left, with moderate 50% proximal right M1 stenosis are unchanged. 2. 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 3. 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    Assessment: Ahley Bulls is a 72 year old female with 4 punctate infarcts within both frontal, right parietal and left temporal lobes and unsuccessful stent placement which led to TCAR right carotid stent on 04/10/2017. Vascular  risk factors include CAD, DM, HLD and HTN.  Also history of aneurysm but on recent MRA this was stable at 3 mm.    Plan:  - continue ASA and plavix and pravastatin for stroke prevention -patient was questioning whether aspirin could be reduced or stopped due to bruising on her upper extremities -advised patient to follow-up with cardiologist in this regards - follow up with Dr. Estanislado Pandy as recommended for routine monitoring of BA aneurysm.  - follow up with Dr. Oneida Alar as recommended -Advised patient to wear compression  stockings and raise lower extremities while sitting to help decrease lower extremity edema - Follow up with your primary care physician for stroke risk factor modification. Recommend maintain blood pressure goal <130/80, diabetes with hemoglobin A1c goal below 7.0% and lipids with LDL cholesterol goal below 70 mg/dL.   Follow-up in 6 months or call earlier if needed  I spent more than 25 minutes of face to face time with the patient. Greater than 50% of time was spent in counseling and coordination of care. We discussed continued follow up, BP management, refilled plavix.

## 2017-10-01 NOTE — Progress Notes (Signed)
I have reviewed the chart and agree with the plan

## 2017-10-11 ENCOUNTER — Other Ambulatory Visit: Payer: Self-pay | Admitting: Physician Assistant

## 2017-10-11 DIAGNOSIS — R809 Proteinuria, unspecified: Principal | ICD-10-CM

## 2017-10-11 DIAGNOSIS — E1129 Type 2 diabetes mellitus with other diabetic kidney complication: Secondary | ICD-10-CM

## 2017-10-14 ENCOUNTER — Encounter: Payer: Self-pay | Admitting: Physician Assistant

## 2017-10-14 ENCOUNTER — Ambulatory Visit: Payer: Medicare Other | Admitting: Physician Assistant

## 2017-10-14 VITALS — BP 146/72 | HR 61 | Temp 97.0°F | Ht 62.0 in | Wt 162.0 lb

## 2017-10-14 DIAGNOSIS — Z1211 Encounter for screening for malignant neoplasm of colon: Secondary | ICD-10-CM | POA: Diagnosis not present

## 2017-10-14 DIAGNOSIS — B91 Sequelae of poliomyelitis: Secondary | ICD-10-CM

## 2017-10-14 DIAGNOSIS — R609 Edema, unspecified: Secondary | ICD-10-CM | POA: Diagnosis not present

## 2017-10-14 DIAGNOSIS — M5431 Sciatica, right side: Secondary | ICD-10-CM

## 2017-10-14 DIAGNOSIS — I10 Essential (primary) hypertension: Secondary | ICD-10-CM

## 2017-10-14 DIAGNOSIS — E1129 Type 2 diabetes mellitus with other diabetic kidney complication: Secondary | ICD-10-CM | POA: Diagnosis not present

## 2017-10-14 DIAGNOSIS — M8960 Osteopathy after poliomyelitis, unspecified site: Secondary | ICD-10-CM | POA: Diagnosis not present

## 2017-10-14 DIAGNOSIS — Z1231 Encounter for screening mammogram for malignant neoplasm of breast: Secondary | ICD-10-CM

## 2017-10-14 DIAGNOSIS — Z1239 Encounter for other screening for malignant neoplasm of breast: Secondary | ICD-10-CM

## 2017-10-14 DIAGNOSIS — K2100 Gastro-esophageal reflux disease with esophagitis, without bleeding: Secondary | ICD-10-CM

## 2017-10-14 DIAGNOSIS — R809 Proteinuria, unspecified: Secondary | ICD-10-CM | POA: Diagnosis not present

## 2017-10-14 LAB — BAYER DCA HB A1C WAIVED: HB A1C: 6.3 % (ref <7.0)

## 2017-10-14 MED ORDER — METFORMIN HCL ER 500 MG PO TB24: 500.0000 mg | ORAL_TABLET | Freq: Two times a day (BID) | ORAL | 3 refills | Status: DC

## 2017-10-14 MED ORDER — AMLODIPINE BESYLATE 10 MG PO TABS: 10.0000 mg | ORAL_TABLET | Freq: Every day | ORAL | 5 refills | Status: DC

## 2017-10-14 MED ORDER — PANTOPRAZOLE SODIUM 20 MG PO TBEC: 20.0000 mg | DELAYED_RELEASE_TABLET | Freq: Every day | ORAL | 5 refills | Status: DC

## 2017-10-14 MED ORDER — FUROSEMIDE 20 MG PO TABS: 20.0000 mg | ORAL_TABLET | Freq: Every day | ORAL | 3 refills | Status: DC

## 2017-10-14 NOTE — Progress Notes (Signed)
BP (!) 146/72   Pulse 61   Temp (!) 97 F (36.1 C) (Oral)   Ht 5' 2"  (1.575 m)   Wt 162 lb (73.5 kg)   BMI 29.63 kg/m    Subjective:    Patient ID: Paula Watson, female    DOB: 03-26-1946, 72 y.o.   MRN: [id number]  HPI: Paula Watson is a 72 y.o. female presenting on 10/14/2017 for Diabetes and Hypertension  This patient comes in for periodic recheck on medications and conditions including **GERD, hypertension, type 2 diabetes, polio deformity, sciatica, need for screening of colon cancer.  She reports that overall she is doing well and not having any other major conditions.  She is having GERD that is not completely healed.  And does need gastroenterology referral*.   All medications are reviewed today. There are no reports of any problems with the medications. All of the medical conditions are reviewed and updated.  Lab work is reviewed and will be ordered as medically necessary. There are no new problems reported with today's visit.  Diabetic Foot Exam - Simple   Simple Foot Form Diabetic Foot exam was performed with the following findings:  Yes 10/14/2017 10:50 AM  Visual Inspection See comments:  Yes Sensation Testing Intact to touch and monofilament testing bilaterally:  Yes Pulse Check Posterior Tibialis and Dorsalis pulse intact bilaterally:  Yes Comments Left foot normal, right with deformity from polio      Past Medical History:  Diagnosis Date  . Anemia   . Aneurysm of artery ([place])   . Blood transfusion   . Bronchitis    "have had a couple of times in my lifetime"  . Cancer ([place]) 1985   cervical  . Cataract   . CKD (chronic kidney disease) stage 1, GFR 90 ml/min or greater was told by her PCP   Intolerant to ACE according to her PCP  . Diabetes mellitus    tyle 2 diabetic  . Dyspnea   . Dysrhythmia    :"SKIPS A BEAT "   SCHEDULED FOR STRESS TEST 2019  . GERD (gastroesophageal reflux disease)   . High cholesterol   . History of hiatal hernia   .  History of kidney stones   . Hyperlipidemia   . Hypertension   . Poliomyelitis since 1948   has drop foot on R  . Stroke [facility]) 2007 affected R side  . Vertigo now improved   Relevant past medical, surgical, family and social history reviewed and updated as indicated. Interim medical history since our last visit reviewed. Allergies and medications reviewed and updated. DATA REVIEWED: CHART IN EPIC  Family History reviewed for pertinent findings.  Review of Systems  Constitutional: Positive for fatigue. Negative for activity change and fever.  HENT: Negative.   Eyes: Negative.   Respiratory: Negative.  Negative for cough.   Cardiovascular: Negative.  Negative for chest pain.  Gastrointestinal: Positive for abdominal distention and abdominal pain.  Endocrine: Negative.   Genitourinary: Negative.  Negative for dysuria.  Musculoskeletal: Positive for arthralgias, back pain and gait problem.  Skin: Negative.     Allergies as of 10/14/2017      Reactions   Penicillins Hives, Swelling   PATIENT HAS HAD A PCN REACTION WITH IMMEDIATE RASH, FACIAL/TONGUE/THROAT SWELLING, SOB, OR LIGHTHEADEDNESS WITH HYPOTENSION:  #  #  #  YES  #  #  #   Has patient had a PCN reaction causing severe rash involving mucus membranes or skin necrosis:  Unknown Has patient had a PCN reaction that required hospitalization: No Has patient had a PCN reaction occurring within the last 10 years: No      Medication List        Accurate as of 10/14/17 11:59 PM. Always use your most recent med list.          albuterol 108 (90 Base) MCG/ACT inhaler Commonly known as:  PROVENTIL HFA;VENTOLIN HFA Inhale 2 puffs into the lungs every 6 (six) hours as needed for wheezing or shortness of breath.   amLODipine 10 MG tablet Commonly known as:  NORVASC Take 1 tablet (10 mg total) by mouth daily.   aspirin 325 MG EC tablet Take 1 tablet (325 mg total) by mouth daily.   carvedilol 12.5 MG tablet Commonly known as:   COREG Take 1 tablet (12.5 mg total) by mouth 2 (two) times daily with a meal.   clopidogrel 75 MG tablet Commonly known as:  PLAVIX Take 1 tablet (75 mg total) by mouth daily.   clotrimazole-betamethasone lotion Commonly known as:  LOTRISONE Apply topically 2 (two) times daily.   fluticasone 50 MCG/ACT nasal spray Commonly known as:  FLONASE Place 1 spray into both nostrils 2 (two) times daily.   furosemide 20 MG tablet Commonly known as:  LASIX Take 1 tablet (20 mg total) by mouth daily.   hydrocortisone 2.5 % rectal cream Commonly known as:  ANUSOL-HC Place 1 application rectally 2 (two) times daily.   linaclotide 145 MCG Caps capsule Commonly known as:  LINZESS Take 1 capsule (145 mcg total) by mouth daily before breakfast.   metFORMIN 500 MG 24 hr tablet Commonly known as:  GLUCOPHAGE-XR Take 1 tablet (500 mg total) by mouth 2 (two) times daily.   mupirocin cream 2 % Commonly known as:  BACTROBAN Apply 1 application topically 2 (two) times daily.   nicotine polacrilex 4 MG gum Commonly known as:  NICORETTE Take 2 mg by mouth as needed for smoking cessation.   pantoprazole 20 MG tablet Commonly known as:  PROTONIX Take 1 tablet (20 mg total) by mouth daily.   pravastatin 20 MG tablet Commonly known as:  PRAVACHOL Take 1 tablet (20 mg total) by mouth daily.   VESICARE 5 MG tablet Generic drug:  solifenacin Take 5 mg by mouth at bedtime.            Durable Medical Equipment  (From admission, onward)        Start     Ordered   10/14/17 0000  DME Other see comment    Comments:  Dx: Chronic edema Rx: compression stockings #1 pair, refills PRN   10/14/17 1114         Objective:    BP (!) 146/72   Pulse 61   Temp (!) 97 F (36.1 C) (Oral)   Ht 5' 2"  (1.575 m)   Wt 162 lb (73.5 kg)   BMI 29.63 kg/m   Allergies  Allergen Reactions  . Penicillins Hives and Swelling    PATIENT HAS HAD A PCN REACTION WITH IMMEDIATE RASH, FACIAL/TONGUE/THROAT  SWELLING, SOB, OR LIGHTHEADEDNESS WITH HYPOTENSION:  #  #  #  YES  #  #  #   Has patient had a PCN reaction causing severe rash involving mucus membranes or skin necrosis: Unknown Has patient had a PCN reaction that required hospitalization: No Has patient had a PCN reaction occurring within the last 10 years: No     Wt Readings from Last 3 Encounters:  10/14/17 162 lb (73.5 kg)  10/01/17 163 lb 12.8 oz (74.3 kg)  06/12/17 155 lb (70.3 kg)    Physical Exam  Constitutional: She is oriented to person, place, and time. She appears well-developed and well-nourished.  HENT:  Head: Normocephalic and atraumatic.  Eyes: Pupils are equal, round, and reactive to light. Conjunctivae and EOM are normal.  Cardiovascular: Normal rate, regular rhythm, normal heart sounds and intact distal pulses.  Pulmonary/Chest: Effort normal and breath sounds normal.  Abdominal: Soft. Bowel sounds are normal.  Neurological: She is alert and oriented to person, place, and time. She has normal reflexes.  Skin: Skin is warm and dry. No rash noted.  Psychiatric: She has a normal mood and affect. Her behavior is normal. Judgment and thought content normal.    Results for orders placed or performed in visit on 10/14/17  CBC with Differential/Platelet  Result Value Ref Range   WBC 8.8 3.4 - 10.8 x10E3/uL   RBC 4.34 3.77 - 5.28 x10E6/uL   Hemoglobin 11.7 11.1 - 15.9 g/dL   Hematocrit 36.7 34.0 - 46.6 %   MCV 85 79 - 97 fL   MCH 27.0 26.6 - 33.0 pg   MCHC 31.9 31.5 - 35.7 g/dL   RDW 14.6 12.3 - 15.4 %   Platelets 308 150 - 450 x10E3/uL   Neutrophils 64 Not Estab. %   Lymphs 24 Not Estab. %   Monocytes 8 Not Estab. %   Eos 4 Not Estab. %   Basos 0 Not Estab. %   Neutrophils Absolute 5.6 1.4 - 7.0 x10E3/uL   Lymphocytes Absolute 2.1 0.7 - 3.1 x10E3/uL   Monocytes Absolute 0.7 0.1 - 0.9 x10E3/uL   EOS (ABSOLUTE) 0.3 0.0 - 0.4 x10E3/uL   Basophils Absolute 0.0 0.0 - 0.2 x10E3/uL   Immature Granulocytes 0 Not  Estab. %   Immature Grans (Abs) 0.0 0.0 - 0.1 x10E3/uL  CMP14+EGFR  Result Value Ref Range   Glucose 164 (H) 65 - 99 mg/dL   BUN 21 8 - 27 mg/dL   Creatinine, Ser 1.89 (H) 0.57 - 1.00 mg/dL   GFR calc non Af Amer 26 (L) >59 mL/min/1.73   GFR calc Af Amer 30 (L) >59 mL/min/1.73   BUN/Creatinine Ratio 11 (L) 12 - 28   Sodium 139 134 - 144 mmol/L   Potassium 4.5 3.5 - 5.2 mmol/L   Chloride 103 96 - 106 mmol/L   CO2 23 20 - 29 mmol/L   Calcium 9.5 8.7 - 10.3 mg/dL   Total Protein 6.0 6.0 - 8.5 g/dL   Albumin 3.7 3.5 - 4.8 g/dL   Globulin, Total 2.3 1.5 - 4.5 g/dL   Albumin/Globulin Ratio 1.6 1.2 - 2.2   Bilirubin Total 0.3 0.0 - 1.2 mg/dL   Alkaline Phosphatase 92 39 - 117 IU/L   AST 14 0 - 40 IU/L   ALT 8 0 - 32 IU/L  Lipid panel  Result Value Ref Range   Cholesterol, Total 183 100 - 199 mg/dL   Triglycerides 104 0 - 149 mg/dL   HDL 66 >39 mg/dL   VLDL Cholesterol Cal 21 5 - 40 mg/dL   LDL Calculated 96 0 - 99 mg/dL   Chol/HDL Ratio 2.8 0.0 - 4.4 ratio  Bayer DCA Hb A1c Waived  Result Value Ref Range   HB A1C (BAYER DCA - WAIVED) 6.3 <7.0 %  Microalbumin / creatinine urine ratio  Result Value Ref Range   Creatinine, Urine 148.1 Not Estab. mg/dL  Microalbumin, Urine 9,493.5 Not Estab. ug/mL   Microalb/Creat Ratio 6,410.2 (H) 0.0 - 30.0 mg/g creat      Assessment & Plan:   1. HTN (hypertension), benign - CBC with Differential/Platelet - CMP14+EGFR - Lipid panel - Bayer DCA Hb A1c Waived - Microalbumin / creatinine urine ratio  2. Controlled type 2 diabetes mellitus with microalbuminuria, without long-term current use of insulin (HCC) - CBC with Differential/Platelet - CMP14+EGFR - Lipid panel - Bayer DCA Hb A1c Waived - Microalbumin / creatinine urine ratio - metFORMIN (GLUCOPHAGE-XR) 500 MG 24 hr tablet; Take 1 tablet (500 mg total) by mouth 2 (two) times daily.  Dispense: 180 tablet; Refill: 3  3. Sciatica of right side Rest  4. Polio osteopathy  ([place]) rest  5. Screening for colon cancer referrral  - Ambulatory referral to Gastroenterology  6. Screening for breast cancer - MM Digital Screening; Future  7. GERD with esophagitis - Ambulatory referral to Gastroenterology  8. Chronic edema - DME Other see comment   Continue all other maintenance medications as listed above.  Follow up plan: Return in about 3 months (around 01/14/2018) for recheck.  Educational handout given for [place] PA-C [place] [address]  [place], [place] [address] [phone]   10/18/2017, 9:50 PM

## 2017-10-15 LAB — CMP14+EGFR
A/G RATIO: 1.6 (ref 1.2–2.2)
ALK PHOS: 92 IU/L (ref 39–117)
ALT: 8 IU/L (ref 0–32)
AST: 14 IU/L (ref 0–40)
Albumin: 3.7 g/dL (ref 3.5–4.8)
BILIRUBIN TOTAL: 0.3 mg/dL (ref 0.0–1.2)
BUN/Creatinine Ratio: 11 — ABNORMAL LOW (ref 12–28)
BUN: 21 mg/dL (ref 8–27)
CHLORIDE: 103 mmol/L (ref 96–106)
CO2: 23 mmol/L (ref 20–29)
Calcium: 9.5 mg/dL (ref 8.7–10.3)
Creatinine, Ser: 1.89 mg/dL — ABNORMAL HIGH (ref 0.57–1.00)
GFR calc non Af Amer: 26 mL/min/{1.73_m2} — ABNORMAL LOW (ref >59)
GFR, EST AFRICAN AMERICAN: 30 mL/min/{1.73_m2} — AB (ref >59)
Globulin, Total: 2.3 g/dL (ref 1.5–4.5)
Glucose: 164 mg/dL — ABNORMAL HIGH (ref 65–99)
POTASSIUM: 4.5 mmol/L (ref 3.5–5.2)
Sodium: 139 mmol/L (ref 134–144)
TOTAL PROTEIN: 6 g/dL (ref 6.0–8.5)

## 2017-10-15 LAB — CBC WITH DIFFERENTIAL/PLATELET
BASOS: 0 %
Basophils Absolute: 0 10*3/uL (ref 0.0–0.2)
EOS (ABSOLUTE): 0.3 10*3/uL (ref 0.0–0.4)
Eos: 4 %
HEMATOCRIT: 36.7 % (ref 34.0–46.6)
HEMOGLOBIN: 11.7 g/dL (ref 11.1–15.9)
IMMATURE GRANS (ABS): 0 10*3/uL (ref 0.0–0.1)
Immature Granulocytes: 0 %
LYMPHS ABS: 2.1 10*3/uL (ref 0.7–3.1)
LYMPHS: 24 %
MCH: 27 pg (ref 26.6–33.0)
MCHC: 31.9 g/dL (ref 31.5–35.7)
MCV: 85 fL (ref 79–97)
MONOS ABS: 0.7 10*3/uL (ref 0.1–0.9)
Monocytes: 8 %
NEUTROS ABS: 5.6 10*3/uL (ref 1.4–7.0)
Neutrophils: 64 %
Platelets: 308 10*3/uL (ref 150–450)
RBC: 4.34 x10E6/uL (ref 3.77–5.28)
RDW: 14.6 % (ref 12.3–15.4)
WBC: 8.8 10*3/uL (ref 3.4–10.8)

## 2017-10-15 LAB — MICROALBUMIN / CREATININE URINE RATIO
Creatinine, Urine: 148.1 mg/dL
MICROALB/CREAT RATIO: 6410.2 mg/g{creat} — AB (ref 0.0–30.0)
Microalbumin, Urine: 9493.5 ug/mL

## 2017-10-15 LAB — LIPID PANEL
Chol/HDL Ratio: 2.8 ratio (ref 0.0–4.4)
Cholesterol, Total: 183 mg/dL (ref 100–199)
HDL: 66 mg/dL (ref >39)
LDL Calculated: 96 mg/dL (ref 0–99)
Triglycerides: 104 mg/dL (ref 0–149)
VLDL Cholesterol Cal: 21 mg/dL (ref 5–40)

## 2017-10-18 DIAGNOSIS — K2100 Gastro-esophageal reflux disease with esophagitis, without bleeding: Secondary | ICD-10-CM | POA: Insufficient documentation

## 2017-10-18 DIAGNOSIS — B91 Sequelae of poliomyelitis: Secondary | ICD-10-CM | POA: Insufficient documentation

## 2017-10-18 DIAGNOSIS — M8960 Osteopathy after poliomyelitis, unspecified site: Secondary | ICD-10-CM

## 2017-10-19 ENCOUNTER — Other Ambulatory Visit: Payer: Self-pay | Admitting: *Deleted

## 2017-10-19 ENCOUNTER — Encounter: Payer: Self-pay | Admitting: Internal Medicine

## 2017-10-19 ENCOUNTER — Telehealth: Payer: Self-pay | Admitting: Physician Assistant

## 2017-10-19 DIAGNOSIS — R7989 Other specified abnormal findings of blood chemistry: Secondary | ICD-10-CM

## 2017-10-19 NOTE — Telephone Encounter (Signed)
Patient aware.

## 2017-10-19 NOTE — Telephone Encounter (Signed)
Please try reducing to 1/2 tab, which will be 5 mg. Keep track of your readings and recheck BMP and micro in one week

## 2017-10-19 NOTE — Telephone Encounter (Signed)
Patient can not remember if you said it was ok for her to decrease her amlodipine. She said you had discussed maybe decreasing it.

## 2017-10-19 NOTE — Telephone Encounter (Signed)
Patient aware of results and labs faxed to Dr. Posey Pronto

## 2017-10-23 DIAGNOSIS — Z23 Encounter for immunization: Secondary | ICD-10-CM | POA: Diagnosis not present

## 2017-10-29 ENCOUNTER — Encounter (HOSPITAL_COMMUNITY): Payer: Medicare Other

## 2017-10-29 ENCOUNTER — Ambulatory Visit: Payer: Medicare Other | Admitting: Family

## 2017-11-02 ENCOUNTER — Telehealth (HOSPITAL_COMMUNITY): Payer: Self-pay

## 2017-11-02 NOTE — Telephone Encounter (Signed)
Called to schedule consult regarding recent mri, no answer, left vm. AW

## 2017-11-19 ENCOUNTER — Ambulatory Visit
Admission: RE | Admit: 2017-11-19 | Discharge: 2017-11-19 | Disposition: A | Discharge status: Home / Self Care | Payer: Medicare Other | Source: Ambulatory Visit | Attending: Physician Assistant | Admitting: Physician Assistant

## 2017-11-19 DIAGNOSIS — Z1239 Encounter for other screening for malignant neoplasm of breast: Secondary | ICD-10-CM

## 2017-11-19 DIAGNOSIS — Z1231 Encounter for screening mammogram for malignant neoplasm of breast: Secondary | ICD-10-CM | POA: Diagnosis not present

## 2017-11-20 ENCOUNTER — Other Ambulatory Visit: Payer: Self-pay | Admitting: Physician Assistant

## 2017-11-20 DIAGNOSIS — R928 Other abnormal and inconclusive findings on diagnostic imaging of breast: Secondary | ICD-10-CM

## 2017-11-26 ENCOUNTER — Telehealth (HOSPITAL_COMMUNITY): Payer: Self-pay

## 2017-11-26 NOTE — Telephone Encounter (Signed)
Called to schedule consult, no answer, left vm. AW  

## 2017-12-07 ENCOUNTER — Telehealth: Payer: Self-pay | Admitting: Physician Assistant

## 2017-12-07 ENCOUNTER — Ambulatory Visit (HOSPITAL_COMMUNITY)
Admission: RE | Admit: 2017-12-07 | Discharge: 2017-12-07 | Disposition: A | Discharge status: Home / Self Care | Payer: Medicare Other | Source: Ambulatory Visit | Attending: Vascular Surgery | Admitting: Vascular Surgery

## 2017-12-07 ENCOUNTER — Ambulatory Visit: Payer: Medicare Other | Admitting: Family

## 2017-12-07 ENCOUNTER — Encounter: Payer: Self-pay | Admitting: Family

## 2017-12-07 VITALS — BP 187/81 | HR 73 | Temp 97.0°F | Resp 18 | Ht 62.0 in | Wt 162.0 lb

## 2017-12-07 DIAGNOSIS — Z8673 Personal history of transient ischemic attack (TIA), and cerebral infarction without residual deficits: Secondary | ICD-10-CM

## 2017-12-07 DIAGNOSIS — I6523 Occlusion and stenosis of bilateral carotid arteries: Secondary | ICD-10-CM | POA: Diagnosis not present

## 2017-12-07 NOTE — Patient Instructions (Signed)

## 2017-12-07 NOTE — Progress Notes (Signed)
Chief Complaint: Follow up Extracranial Carotid Artery Stenosis   History of Present Illness  Paula Watson is a 72 y.o. female who returns for follow-up today after TCAR right carotid stent placement on April 10, 2017 by Dr. Oneida Alar.  This was done for symptomatic lesion with prior stroke.    She states she still has some subtle changes of clumsiness and proprioception deficits of her left upper extremity.  This is fairly similar to her post stroke deficit.  She had worked with physical therapy and continues exercises they gave her.  She continues her Plavix and aspirin.   Dr. Oneida Alar last evaluated pt on 04-23-17. At that time duplex ultrasound of her stent showed that the stent was widely patent with no significant narrowing heavily calcified plaque chronic left common carotid artery occlusion She was doing well status post TCAR stenting of right carotid. Dr. Oneida Alar advised pt to follow-up 6 months with nurse practitioner with a carotid duplex ultrasound, continue Plavix and aspirin.  She has right foot drop from polio, wears an ankle brace.    Her GFR was 26 on 10-14-17, creatinine was 1.89, stage 4 CKD. Her nephrologist is Dr. Posey Pronto.   She states she may need dental work, had a dental abscess. She states her dentist will ask our advice re holding the Plavix for a few days once he decides to proceed with oral surgery. Pt states she bruises easily on her arms. She denies nose bleeds, denies GI bleeding.   Pt states she checked her blood pressure at [facility] yesterday, states it was 170/80. She reports a headache now, started 2-3 days ago. She denies chest pain or dyspnea.   Diabetic: yes, A1C was 6.3 on 10-14-17 Tobacco use: former smoker, quit in December 2018, started in her teens  Pt meds include: Statin : yes ASA: yes Other anticoagulants/antiplatelets: Plavix   Past Medical History:  Diagnosis Date  . Anemia   . Aneurysm of artery ([place])   . Blood transfusion   . Bronchitis     "have had a couple of times in my lifetime"  . Cancer ([place]) 1985   cervical  . Cataract   . CKD (chronic kidney disease) stage 1, GFR 90 ml/min or greater was told by her PCP   Intolerant to ACE according to her PCP  . Diabetes mellitus    tyle 2 diabetic  . Dyspnea   . Dysrhythmia    :"SKIPS A BEAT "   SCHEDULED FOR STRESS TEST 2019  . GERD (gastroesophageal reflux disease)   . High cholesterol   . History of hiatal hernia   . History of kidney stones   . Hyperlipidemia   . Hypertension   . Poliomyelitis since 1948   has drop foot on R  . Stroke [facility]) 2007 affected R side  . Vertigo now improved    Social History Social History   Tobacco Use  . Smoking status: Former Smoker    Packs/day: 0.25    Years: 50.00    Pack years: 12.50    Types: Cigarettes    Last attempt to quit: 03/03/2017    Years since quitting: 0.7  . Smokeless tobacco: Never Used  . Tobacco comment: "working hard to quit"  Substance Use Topics  . Alcohol use: No  . Drug use: No    Family History Family History  Problem Relation Age of Onset  . Coronary artery disease Mother   . Heart disease Mother   . Stroke Mother   .  Diabetes type II Father   . Cancer Father        brain stem  . Brain cancer Father   . Diabetes Father   . Cancer Sister        breast with recurrance  . Diabetes Sister   . Stroke Sister   . Breast cancer Sister   . Coronary artery disease Brother   . COPD Maternal Grandmother        breast  . Breast cancer Maternal Grandmother   . COPD Paternal Grandmother        breast  . Breast cancer Paternal Grandmother   . Heart disease Brother   . Heart attack Brother   . COPD Other   . Breast cancer Other   . Stroke Paternal Grandfather     Surgical History Past Surgical History:  Procedure Laterality Date  . CAROTID ENDARTERECTOMY Left 2000 L side  . CERVICAL CONIZATION W/BX  1985  . COLONOSCOPY    . CORONARY ANGIOPLASTY  stented 2007  . DILATION AND CURETTAGE  OF UTERUS  1980's  . foot reconstructjion  1953   right foot; post polio  . IR ANGIO EXTERNAL CAROTID SEL EXT CAROTID UNI R MOD SED  03/06/2017  . IR ANGIO INTRA EXTRACRAN SEL COM CAROTID INNOMINATE UNI R MOD SED  03/24/2017  . IR ANGIO VERTEBRAL SEL SUBCLAVIAN INNOMINATE BILAT MOD SED  03/09/2017  . IR ANGIO VERTEBRAL SEL VERTEBRAL BILAT MOD SED  03/06/2017  . IR RADIOLOGIST EVAL & MGMT  07/21/2016  . RADIOLOGY WITH ANESTHESIA N/A 03/24/2017   Procedure: STENTING;  Surgeon: Luanne Bras, MD;  Location: [facility];  Service: Radiology;  Laterality: N/A;  . TONSILLECTOMY  1954    Allergies  Allergen Reactions  . Penicillins Hives and Swelling    PATIENT HAS HAD A PCN REACTION WITH IMMEDIATE RASH, FACIAL/TONGUE/THROAT SWELLING, SOB, OR LIGHTHEADEDNESS WITH HYPOTENSION:  #  #  #  YES  #  #  #   Has patient had a PCN reaction causing severe rash involving mucus membranes or skin necrosis: Unknown Has patient had a PCN reaction that required hospitalization: No Has patient had a PCN reaction occurring within the last 10 years: No     Current Outpatient Medications  Medication Sig Dispense Refill  . amLODipine (NORVASC) 10 MG tablet Take 1 tablet (10 mg total) by mouth daily. 30 tablet 5  . aspirin EC 325 MG EC tablet Take 1 tablet (325 mg total) by mouth daily. 30 tablet 0  . carvedilol (COREG) 12.5 MG tablet Take 1 tablet (12.5 mg total) by mouth 2 (two) times daily with a meal. 180 tablet 3  . clopidogrel (PLAVIX) 75 MG tablet Take 1 tablet (75 mg total) by mouth daily. 90 tablet 3  . clotrimazole-betamethasone (LOTRISONE) lotion Apply topically 2 (two) times daily. 30 mL 11  . furosemide (LASIX) 20 MG tablet Take 1 tablet (20 mg total) by mouth daily. 90 tablet 3  . hydrocortisone (ANUSOL-HC) 2.5 % rectal cream Place 1 application rectally 2 (two) times daily. (Patient taking differently: Place 1 application rectally 2 (two) times daily as needed (IRRITATION). ) 30 g 0  . linaclotide  (LINZESS) 145 MCG CAPS capsule Take 1 capsule (145 mcg total) by mouth daily before breakfast. 30 capsule 11  . metFORMIN (GLUCOPHAGE-XR) 500 MG 24 hr tablet Take 1 tablet (500 mg total) by mouth 2 (two) times daily. 180 tablet 3  . mupirocin cream (BACTROBAN) 2 % Apply 1 application topically 2 (  two) times daily. (Patient taking differently: Apply 1 application topically 2 (two) times daily as needed (pressure callus). ) 15 g 0  . nicotine polacrilex (NICORETTE) 4 MG gum Take 2 mg by mouth as needed for smoking cessation.     . pantoprazole (PROTONIX) 20 MG tablet Take 1 tablet (20 mg total) by mouth daily. 30 tablet 5  . pravastatin (PRAVACHOL) 20 MG tablet Take 1 tablet (20 mg total) by mouth daily. 30 tablet 11  . VESICARE 5 MG tablet Take 5 mg by mouth at bedtime.  6  . albuterol (PROVENTIL HFA;VENTOLIN HFA) 108 (90 Base) MCG/ACT inhaler Inhale 2 puffs into the lungs every 6 (six) hours as needed for wheezing or shortness of breath. (Patient not taking: Reported on 12/07/2017) 1 Inhaler 6  . fluticasone (FLONASE) 50 MCG/ACT nasal spray Place 1 spray into both nostrils 2 (two) times daily. (Patient not taking: Reported on 12/07/2017) 16 g 11   No current facility-administered medications for this visit.     Review of Systems : See HPI for pertinent positives and negatives.  Physical Examination  Vitals:   12/07/17 1405 12/07/17 1407  BP: (!) 184/84 (!) 187/81  Pulse: 73   Resp: 18   Temp: (!) 97 F (36.1 C)   TempSrc: Oral   SpO2: 96%   Weight: 162 lb (73.5 kg)   Height: 5\' 2"  (1.575 m)    Body mass index is 29.63 kg/m.  General: WDWN female in NAD GAIT: normal Eyes: PERRLA HENT: No gross abnormalities.  Pulmonary:  Respirations are non-labored, good air movement in all fields, CTAB, no rales, rhonchi, or wheezing. Cardiac: regular rhythm, no detected murmur.  VASCULAR EXAM Carotid Bruits Right Left   Negative Positive     Abdominal aortic pulse is not palpable. Radial  pulses are 2+ palpable and equal.                                                                                                                            LE Pulses Right Left       POPLITEAL  not palpable   not palpable       POSTERIOR TIBIAL  not palpable, ankle brace on place   1+ palpable        DORSALIS PEDIS      ANTERIOR TIBIAL not palpable, ankle brace in place  1+ palpable     Gastrointestinal: soft, nontender, BS WNL, no r/g, no palpable masses. Musculoskeletal: no muscle atrophy/wasting. M/S 5/5 in upper extremities, 4/5 in lower extremities, extremities without ischemic changes. Brace in place right foot, ankle, and calf to maintain foot at 90 degrees.  Skin: No rashes, no ulcers, no cellulitis.   Neurologic:  A&O X 3; appropriate affect, sensation is normal; speech is normal, CN 2-12 intact, pain and light touch intact in extremities, motor exam as listed above. Psychiatric: Normal thought content, mood appropriate to clinical situation.    Assessment: Ethleen ALICYN KLANN is  a 72 y.o. female who is s/p TCAR right carotid stent placement on April 10, 2017 by Dr. Oneida Alar.  This was done for symptomatic lesion with prior stroke.    She is hypertensive, has had a headache for 2-3 days. She has stage 4 CKD. I advised pt to call her PCP office today ASAP and seek advice. I wrote her blood pressure a a piece of paper to give to her PCP office.  Her son is with her.    DATA Carotid Duplex (12-07-17): Right ICA: 1-39% stenosis. Right CCA with non hemodynamically significant plaque <50%. Left ICA: 1-39% stenosis. The CCA appears occluded.  Bilateral vertebral artery flow is antegrade.  Right subclavian artery flow was disturbed, left subclavian artery waveforms are normal.  No change compared to the exam on 04-23-17.     Plan: Follow-up in 6 months with Carotid Duplex scan.   I discussed in depth with the patient the nature of atherosclerosis, and emphasized the importance of  maximal medical management including strict control of blood pressure, blood glucose, and lipid levels, obtaining regular exercise, and continued cessation of smoking.  The patient is aware that without maximal medical management the underlying atherosclerotic disease process will progress, limiting the benefit of any interventions. The patient was given information about stroke prevention and what symptoms should prompt the patient to seek immediate medical care. Thank you for allowing [place] to participate in this patient's care.  Clemon Chambers, RN, MSN, FNP-C [facility] Office: [phone]  Clinic Physician: Idalia Needle  12/07/17 2:13 PM

## 2017-12-08 NOTE — Telephone Encounter (Signed)
Patient given appointment at 4:10 on Thursday 12/10/17.

## 2017-12-10 ENCOUNTER — Ambulatory Visit (INDEPENDENT_AMBULATORY_CARE_PROVIDER_SITE_OTHER): Payer: Medicare Other | Admitting: Physician Assistant

## 2017-12-10 ENCOUNTER — Encounter: Payer: Self-pay | Admitting: Physician Assistant

## 2017-12-10 VITALS — BP 171/75 | HR 65 | Temp 98.5°F | Ht 62.0 in | Wt 163.6 lb

## 2017-12-10 DIAGNOSIS — R801 Persistent proteinuria, unspecified: Secondary | ICD-10-CM | POA: Diagnosis not present

## 2017-12-10 DIAGNOSIS — R7989 Other specified abnormal findings of blood chemistry: Secondary | ICD-10-CM | POA: Diagnosis not present

## 2017-12-10 DIAGNOSIS — I10 Essential (primary) hypertension: Secondary | ICD-10-CM

## 2017-12-11 LAB — CBC WITH DIFFERENTIAL/PLATELET
Basophils Absolute: 0 10*3/uL (ref 0.0–0.2)
Basos: 0 %
EOS (ABSOLUTE): 0.4 10*3/uL (ref 0.0–0.4)
EOS: 3 %
HEMATOCRIT: 36.3 % (ref 34.0–46.6)
Hemoglobin: 11.7 g/dL (ref 11.1–15.9)
Immature Grans (Abs): 0 10*3/uL (ref 0.0–0.1)
Immature Granulocytes: 0 %
LYMPHS ABS: 2.6 10*3/uL (ref 0.7–3.1)
LYMPHS: 24 %
MCH: 26.4 pg — ABNORMAL LOW (ref 26.6–33.0)
MCHC: 32.2 g/dL (ref 31.5–35.7)
MCV: 82 fL (ref 79–97)
MONOCYTES: 9 %
Monocytes Absolute: 1 10*3/uL — ABNORMAL HIGH (ref 0.1–0.9)
NEUTROS ABS: 6.9 10*3/uL (ref 1.4–7.0)
Neutrophils: 64 %
PLATELETS: 365 10*3/uL (ref 150–450)
RBC: 4.44 x10E6/uL (ref 3.77–5.28)
RDW: 15.1 % (ref 12.3–15.4)
WBC: 10.9 10*3/uL — ABNORMAL HIGH (ref 3.4–10.8)

## 2017-12-11 LAB — CMP14+EGFR
ALBUMIN: 3.6 g/dL (ref 3.5–4.8)
ALT: 11 IU/L (ref 0–32)
AST: 17 IU/L (ref 0–40)
Albumin/Globulin Ratio: 1.5 (ref 1.2–2.2)
Alkaline Phosphatase: 98 IU/L (ref 39–117)
BUN / CREAT RATIO: 10 — AB (ref 12–28)
BUN: 16 mg/dL (ref 8–27)
Bilirubin Total: 0.3 mg/dL (ref 0.0–1.2)
CALCIUM: 8.3 mg/dL — AB (ref 8.7–10.3)
CO2: 24 mmol/L (ref 20–29)
CREATININE: 1.67 mg/dL — AB (ref 0.57–1.00)
Chloride: 102 mmol/L (ref 96–106)
GFR, EST AFRICAN AMERICAN: 35 mL/min/{1.73_m2} — AB (ref >59)
GFR, EST NON AFRICAN AMERICAN: 30 mL/min/{1.73_m2} — AB (ref >59)
GLUCOSE: 107 mg/dL — AB (ref 65–99)
Globulin, Total: 2.4 g/dL (ref 1.5–4.5)
Potassium: 3.3 mmol/L — ABNORMAL LOW (ref 3.5–5.2)
Sodium: 143 mmol/L (ref 134–144)
Total Protein: 6 g/dL (ref 6.0–8.5)

## 2017-12-11 LAB — MICROALBUMIN / CREATININE URINE RATIO
Creatinine, Urine: 67.7 mg/dL
Microalb/Creat Ratio: 4095.3 mg/g creat — ABNORMAL HIGH (ref 0.0–30.0)
Microalbumin, Urine: 2772.5 ug/mL

## 2017-12-14 NOTE — Progress Notes (Signed)
BP (!) 171/75   Pulse 65   Temp 98.5 F (36.9 C) (Oral)   Ht 5' 2"  (1.575 m)   Wt 163 lb 9.6 oz (74.2 kg)   BMI 29.92 kg/m    Subjective:    Patient ID: Paula Watson, female    DOB: [birth year], 72 y.o.   MRN: [id number]  HPI: Paula Watson is a 72 y.o. female presenting on 12/10/2017 for Hypertension  This patient comes in for recheck on multiple medical conditions.  She does have hypertension, she has had a stroke in the past.  She has cardiovascular disease.  She has been having slightly low blood pressures but then they have been quite elevated in the past few weeks.  They have been adjusting some of her medications.  She reports that she is having some ankle swelling.  We will look into making some changes with her meds.  She states that she has been using the Lasix.  She has been voiding well.  She does have have an appointment being made for nephrology.  She has had a worsening of her BUN and creatinine in recent weeks.  Past Medical History:  Diagnosis Date  . Anemia   . Aneurysm of artery ([place])   . Blood transfusion   . Bronchitis    "have had a couple of times in my lifetime"  . Cancer ([place]) 1985   cervical  . Cataract   . CKD (chronic kidney disease) stage 1, GFR 90 ml/min or greater was told by her PCP   Intolerant to ACE according to her PCP  . Diabetes mellitus    tyle 2 diabetic  . Dyspnea   . Dysrhythmia    :"SKIPS A BEAT "   SCHEDULED FOR STRESS TEST 2019  . GERD (gastroesophageal reflux disease)   . High cholesterol   . History of hiatal hernia   . History of kidney stones   . Hyperlipidemia   . Hypertension   . Poliomyelitis since 1948   has drop foot on R  . Stroke [facility]) 2007 affected R side  . Vertigo now improved   Relevant past medical, surgical, family and social history reviewed and updated as indicated. Interim medical history since our last visit reviewed. Allergies and medications reviewed and updated. DATA REVIEWED: CHART IN  EPIC  Family History reviewed for pertinent findings.  Review of Systems  Constitutional: Negative.   HENT: Negative.   Eyes: Negative.   Respiratory: Negative.   Gastrointestinal: Negative.   Genitourinary: Negative.     Allergies as of 12/10/2017      Reactions   Penicillins Hives, Swelling   PATIENT HAS HAD A PCN REACTION WITH IMMEDIATE RASH, FACIAL/TONGUE/THROAT SWELLING, SOB, OR LIGHTHEADEDNESS WITH HYPOTENSION:  #  #  #  YES  #  #  #   Has patient had a PCN reaction causing severe rash involving mucus membranes or skin necrosis: Unknown Has patient had a PCN reaction that required hospitalization: No Has patient had a PCN reaction occurring within the last 10 years: No      Medication List        Accurate as of 12/10/17 11:59 PM. Always use your most recent med list.          albuterol 108 (90 Base) MCG/ACT inhaler Commonly known as:  PROVENTIL HFA;VENTOLIN HFA Inhale 2 puffs into the lungs every 6 (six) hours as needed for wheezing or shortness of breath.   amLODipine 10 MG tablet  Commonly known as:  NORVASC Take 1 tablet (10 mg total) by mouth daily.   aspirin 325 MG EC tablet Take 1 tablet (325 mg total) by mouth daily.   carvedilol 12.5 MG tablet Commonly known as:  COREG Take 1 tablet (12.5 mg total) by mouth 2 (two) times daily with a meal.   clopidogrel 75 MG tablet Commonly known as:  PLAVIX Take 1 tablet (75 mg total) by mouth daily.   clotrimazole-betamethasone lotion Commonly known as:  LOTRISONE Apply topically 2 (two) times daily.   fluticasone 50 MCG/ACT nasal spray Commonly known as:  FLONASE Place 1 spray into both nostrils 2 (two) times daily.   furosemide 20 MG tablet Commonly known as:  LASIX Take 1 tablet (20 mg total) by mouth daily.   hydrocortisone 2.5 % rectal cream Commonly known as:  ANUSOL-HC Place 1 application rectally 2 (two) times daily.   linaclotide 145 MCG Caps capsule Commonly known as:  LINZESS Take 1 capsule  (145 mcg total) by mouth daily before breakfast.   metFORMIN 500 MG 24 hr tablet Commonly known as:  GLUCOPHAGE-XR Take 1 tablet (500 mg total) by mouth 2 (two) times daily.   mupirocin cream 2 % Commonly known as:  BACTROBAN Apply 1 application topically 2 (two) times daily.   nicotine polacrilex 4 MG gum Commonly known as:  NICORETTE Take 2 mg by mouth as needed for smoking cessation.   pantoprazole 20 MG tablet Commonly known as:  PROTONIX Take 1 tablet (20 mg total) by mouth daily.   pravastatin 20 MG tablet Commonly known as:  PRAVACHOL Take 1 tablet (20 mg total) by mouth daily.   VESICARE 5 MG tablet Generic drug:  solifenacin Take 5 mg by mouth at bedtime.          Objective:    BP (!) 171/75   Pulse 65   Temp 98.5 F (36.9 C) (Oral)   Ht 5' 2"  (1.575 m)   Wt 163 lb 9.6 oz (74.2 kg)   BMI 29.92 kg/m   Allergies  Allergen Reactions  . Penicillins Hives and Swelling    PATIENT HAS HAD A PCN REACTION WITH IMMEDIATE RASH, FACIAL/TONGUE/THROAT SWELLING, SOB, OR LIGHTHEADEDNESS WITH HYPOTENSION:  #  #  #  YES  #  #  #   Has patient had a PCN reaction causing severe rash involving mucus membranes or skin necrosis: Unknown Has patient had a PCN reaction that required hospitalization: No Has patient had a PCN reaction occurring within the last 10 years: No     Wt Readings from Last 3 Encounters:  12/10/17 163 lb 9.6 oz (74.2 kg)  12/07/17 162 lb (73.5 kg)  10/14/17 162 lb (73.5 kg)    Physical Exam  Constitutional: She is oriented to person, place, and time. She appears well-developed and well-nourished.  HENT:  Head: Normocephalic and atraumatic.  Eyes: Pupils are equal, round, and reactive to light. Conjunctivae and EOM are normal.  Cardiovascular: Normal rate, regular rhythm, normal heart sounds and intact distal pulses.  Pulmonary/Chest: Effort normal and breath sounds normal.  Abdominal: Soft. Bowel sounds are normal.  Neurological: She is alert and  oriented to person, place, and time. She has normal reflexes.  Skin: Skin is warm and dry. No rash noted.  Psychiatric: She has a normal mood and affect. Her behavior is normal. Judgment and thought content normal.    Results for orders placed or performed in visit on 12/10/17  CMP14+EGFR  Result Value Ref Range  Glucose 107 (H) 65 - 99 mg/dL   BUN 16 8 - 27 mg/dL   Creatinine, Ser 1.67 (H) 0.57 - 1.00 mg/dL   GFR calc non Af Amer 30 (L) >59 mL/min/1.73   GFR calc Af Amer 35 (L) >59 mL/min/1.73   BUN/Creatinine Ratio 10 (L) 12 - 28   Sodium 143 134 - 144 mmol/L   Potassium 3.3 (L) 3.5 - 5.2 mmol/L   Chloride 102 96 - 106 mmol/L   CO2 24 20 - 29 mmol/L   Calcium 8.3 (L) 8.7 - 10.3 mg/dL   Total Protein 6.0 6.0 - 8.5 g/dL   Albumin 3.6 3.5 - 4.8 g/dL   Globulin, Total 2.4 1.5 - 4.5 g/dL   Albumin/Globulin Ratio 1.5 1.2 - 2.2   Bilirubin Total 0.3 0.0 - 1.2 mg/dL   Alkaline Phosphatase 98 39 - 117 IU/L   AST 17 0 - 40 IU/L   ALT 11 0 - 32 IU/L  CBC with Differential/Platelet  Result Value Ref Range   WBC 10.9 (H) 3.4 - 10.8 x10E3/uL   RBC 4.44 3.77 - 5.28 x10E6/uL   Hemoglobin 11.7 11.1 - 15.9 g/dL   Hematocrit 36.3 34.0 - 46.6 %   MCV 82 79 - 97 fL   MCH 26.4 (L) 26.6 - 33.0 pg   MCHC 32.2 31.5 - 35.7 g/dL   RDW 15.1 12.3 - 15.4 %   Platelets 365 150 - 450 x10E3/uL   Neutrophils 64 Not Estab. %   Lymphs 24 Not Estab. %   Monocytes 9 Not Estab. %   Eos 3 Not Estab. %   Basos 0 Not Estab. %   Neutrophils Absolute 6.9 1.4 - 7.0 x10E3/uL   Lymphocytes Absolute 2.6 0.7 - 3.1 x10E3/uL   Monocytes Absolute 1.0 (H) 0.1 - 0.9 x10E3/uL   EOS (ABSOLUTE) 0.4 0.0 - 0.4 x10E3/uL   Basophils Absolute 0.0 0.0 - 0.2 x10E3/uL   Immature Granulocytes 0 Not Estab. %   Immature Grans (Abs) 0.0 0.0 - 0.1 x10E3/uL  Microalbumin / creatinine urine ratio  Result Value Ref Range   Creatinine, Urine 67.7 Not Estab. mg/dL   Microalbumin, Urine 2,772.5 Not Estab. ug/mL   Microalb/Creat Ratio  4,095.3 (H) 0.0 - 30.0 mg/g creat      Assessment & Plan:   1. Elevated serum creatinine - CMP14+EGFR - CBC with Differential/Platelet - Microalbumin / creatinine urine ratio  2. Persistent proteinuria - CMP14+EGFR - CBC with Differential/Platelet - Microalbumin / creatinine urine ratio  3. HTN (hypertension), benign - CMP14+EGFR   Continue all other maintenance medications as listed above.  Follow up plan: Return in about 4 weeks (around 01/07/2018) for recheck.  Educational handout given for [place] PA-C [place] [address]  [place], [place] [address] [phone]   12/14/2017, 8:42 PM

## 2017-12-15 ENCOUNTER — Other Ambulatory Visit: Payer: Self-pay | Admitting: Physician Assistant

## 2017-12-15 DIAGNOSIS — R809 Proteinuria, unspecified: Principal | ICD-10-CM

## 2017-12-15 DIAGNOSIS — N183 Chronic kidney disease, stage 3 unspecified: Secondary | ICD-10-CM

## 2017-12-15 DIAGNOSIS — E1129 Type 2 diabetes mellitus with other diabetic kidney complication: Secondary | ICD-10-CM

## 2017-12-21 ENCOUNTER — Ambulatory Visit (INDEPENDENT_AMBULATORY_CARE_PROVIDER_SITE_OTHER): Payer: Medicare Other | Admitting: *Deleted

## 2017-12-21 ENCOUNTER — Other Ambulatory Visit: Payer: Self-pay

## 2017-12-21 ENCOUNTER — Encounter: Payer: Self-pay | Admitting: *Deleted

## 2017-12-21 VITALS — BP 166/69 | HR 67 | Ht 60.5 in | Wt 166.0 lb

## 2017-12-21 DIAGNOSIS — Z0000 Encounter for general adult medical examination without abnormal findings: Secondary | ICD-10-CM

## 2017-12-21 DIAGNOSIS — I6523 Occlusion and stenosis of bilateral carotid arteries: Secondary | ICD-10-CM

## 2017-12-21 NOTE — Progress Notes (Addendum)
Subjective:   Paula Watson is a 72 y.o. female who presents for a Medicare Annual Wellness Visit. Kharizma lives at home. One of her sisters lives with her. She is divorced and has 2 adult children and three grandsons. She kept her youngest grandson during the day up until she had a stroke. She didn't feel safe watching him alone after that but she misses not having him there. He was gave her motivation to stay active and now she doesn't feel like she has any. She had polio as a child and wears a brace on her right lower leg because of the damage. 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 So she's not accustomed to this lack of motivation and knows that she would feel better if she pushed herself to do more.  She has considered walking at the park in the afternoon but she doesn't want to walk alone. Since having a stroke she is afraid to do things like that alone. She does have someone that can walk with her now and she intends on starting. She also has resistance bands and exercise sheets that she was given during PT and she would like to start using those again. She enjoys reading but has been watching more television than reading lately.    Review of Systems    Patient reports that her overall health is better compared to last year.  Cardiac Risk Factors include: hypertension;dyslipidemia;diabetes mellitus;microalbuminuria;advanced age (>53men, >66 women)  Neurological: suffered a witnessed stroke in 02/2017. Everyone acted quickly and she was able to receive TPA that broke the clots up. She has had an endarterectomy and carotid stent placement. Follows up with neuro as instructed.  HEENT: problems with receding gums and gingivitis. Feels like it has been worse since the stroke. On doxycycline now for gum infection and scheduled for tooth extraction tomorrow.   All other systems negative      Current Medications (verified) Outpatient Encounter Medications as of 12/21/2017  Medication Sig  . albuterol (PROVENTIL HFA;VENTOLIN HFA) 108 (90 Base) MCG/ACT inhaler Inhale 2 puffs into the lungs every 6 (six) hours as needed for wheezing or shortness of breath.  Marland Kitchen amLODipine (NORVASC) 10 MG tablet Take 1 tablet (10 mg total) by mouth daily.  Marland Kitchen aspirin EC 325 MG EC tablet Take 1 tablet (325 mg total) by mouth daily.  . carvedilol (COREG) 12.5 MG tablet Take 1 tablet (12.5 mg total) by mouth 2 (two) times daily with a meal.  . clopidogrel (PLAVIX) 75 MG tablet Take 1 tablet (75 mg total) by mouth daily.  . clotrimazole-betamethasone (LOTRISONE) lotion Apply topically 2 (two) times daily.  Marland Kitchen doxycycline (VIBRAMYCIN) 100 MG capsule TAKE 1 CAPSULE BY MOUTH EVERY 12 HOURS  . fluticasone (FLONASE) 50 MCG/ACT nasal spray Place 1 spray into both nostrils 2 (two) times daily.  . furosemide (LASIX) 20 MG tablet Take 1 tablet (20 mg total) by mouth daily.  . hydrocortisone (ANUSOL-HC) 2.5 % rectal cream Place 1 application rectally 2 (two) times daily. (Patient taking differently: Place 1 application rectally 2 (two) times daily as needed (IRRITATION). )  . linaclotide (LINZESS) 145 MCG CAPS capsule Take 1 capsule (145 mcg total) by mouth daily before breakfast.  . metFORMIN (GLUCOPHAGE-XR) 500 MG 24 hr tablet Take 1 tablet (500 mg total) by mouth 2 (two) times daily.  . mupirocin cream (BACTROBAN) 2 % Apply  1 application topically 2 (two) times daily. (Patient taking differently: Apply 1 application topically 2 (two) times daily as needed (pressure callus). )  . nicotine polacrilex (NICORETTE) 4 MG gum Take 2 mg by mouth as needed for smoking cessation.   . pantoprazole (PROTONIX) 20 MG tablet Take 1 tablet (20 mg total) by mouth daily.  . pravastatin (PRAVACHOL) 20 MG tablet Take 1 tablet (20 mg total) by mouth daily.  . VESICARE 5 MG tablet Take 5 mg by mouth at bedtime.   No facility-administered  encounter medications on file as of 12/21/2017.     Allergies (verified) Penicillins   History: Past Medical History:  Diagnosis Date  . Anemia   . 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 Blood transfusion   . Bronchitis    "have had a couple of times in my lifetime"  . Cancer ([place]) 1985   cervical  . Cataract   . CKD (chronic kidney disease) stage 1, GFR 90 ml/min or greater was told by her PCP   Intolerant to ACE according to her PCP  . Diabetes mellitus    tyle 2 diabetic  . Dyspnea   . Dysrhythmia    :"SKIPS A BEAT "   SCHEDULED FOR STRESS TEST 2019  . GERD (gastroesophageal reflux disease)   . High cholesterol   . History of hiatal hernia   . History of kidney stones   . Hyperlipidemia   . Hypertension   . Poliomyelitis since 1948   has drop foot on R  . Stroke [facility]) 2007 affected R side  . Vertigo now improved   Past Surgical History:  Procedure Laterality Date  . CAROTID ENDARTERECTOMY Left 2000 L side  . CERVICAL CONIZATION W/BX  1985  . COLONOSCOPY    . CORONARY ANGIOPLASTY  stented 2007  . DILATION AND CURETTAGE OF UTERUS  1980's  . foot reconstructjion  1953   right foot; post polio  . IR ANGIO EXTERNAL CAROTID SEL EXT CAROTID UNI R MOD SED  03/06/2017  . IR ANGIO INTRA EXTRACRAN SEL COM CAROTID INNOMINATE UNI R MOD SED  03/24/2017  . IR ANGIO VERTEBRAL SEL SUBCLAVIAN INNOMINATE BILAT MOD SED  03/09/2017  . IR ANGIO VERTEBRAL SEL VERTEBRAL BILAT MOD SED  03/06/2017  . IR RADIOLOGIST EVAL & MGMT  07/21/2016  . RADIOLOGY WITH ANESTHESIA N/A 03/24/2017   Procedure: STENTING;  Surgeon: Luanne Bras, MD;  Location: [facility];  Service: Radiology;  Laterality: N/A;  . TONSILLECTOMY  1954   Family History  Problem Relation Age of Onset  . Coronary artery disease Mother   . Heart disease Mother   . Stroke Mother   . Diabetes type II Father   . Cancer Father        brain stem  . Brain cancer Father   . Diabetes Father   . Cancer Sister        breast with  recurrance  . Diabetes Sister   . Stroke Sister   . Breast cancer Sister   . Coronary artery disease Brother   . Healthy Daughter   . COPD Maternal Grandmother        breast  . Breast cancer Maternal Grandmother   . COPD Paternal Grandmother        breast  . Breast cancer Paternal Grandmother   . Obesity Sister   . Heart disease Brother   . Heart attack Brother   . COPD Other   . Breast cancer Other   .  Stroke Paternal Grandfather   . Drug abuse Son   . Alcohol abuse Son    Social History   Socioeconomic History  . Marital status: Divorced    Spouse name: Not on file  . Number of children: 2  . Years of education: 42  . Highest education level: 12th grade  Occupational History  . 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 Financial resource strain: Somewhat hard  . Food insecurity:    Worry: Never true    Inability: Never true  . Transportation needs:    Medical: No    Non-medical: No  Tobacco Use  . Smoking status: Former Smoker    Packs/day: 0.25    Years: 50.00    Pack years: 12.50    Types: Cigarettes    Last attempt to quit: 03/03/2017    Years since quitting: 0.8  . Smokeless tobacco: Never Used  Substance and Sexual Activity  . Alcohol use: No  . Drug use: No  . Sexual activity: Not Currently    Birth control/protection: Post-menopausal  Lifestyle  . Physical activity:    Days per week: 0 days    Minutes per session: 0 min  . Stress: Rather much  Relationships  . Social connections:    Talks on phone: More than three times a week    Gets together: More than three times a week    Attends religious service: Never    Active member of club or organization: No    Attends meetings of clubs or organizations: Never    Relationship status: Divorced  Other Topics Concern  . Not on file  Social History Narrative  . Not on file    Tobacco Use No.  Clinical Intake:  Pre-visit preparation  completed: No  Pain : No/denies pain     Nutritional Status: BMI > 30  Obese Diabetes: No  How often do you need to have someone help you when you read instructions, pamphlets, or other written materials from your doctor or pharmacy?: 1 - Never What is the last grade level you completed in school?: 12 grade     Information entered by :: Chong Sicilian, RN   Activities of Daily Living In your present state of health, do you have any difficulty performing the following activities: 12/21/2017 04/11/2017  Hearing? N N  Vision? N N  Difficulty concentrating or making decisions? N N  Walking or climbing stairs? N Y  Dressing or bathing? N N  Doing errands, shopping? N Y  Conservation officer, nature and eating ? N -  Using the Toilet? N -  In the past six months, have you accidently leaked urine? N -  Do you have problems with loss of bowel control? N -  Managing your Medications? N -  Managing your Finances? N -  Housekeeping or managing your Housekeeping? N -  Some recent data might be hidden      Diet Drinks Diet Coke-normally drinks 2 16oz bottles day Drinks some of the carbonated, flavored water now to replace soda Drinks water as well Likes peanut butter. Doesn't have a very good appetite. Snacks on things instead of eating meals. Likes peanut butter.  Eats too much salt  Exercise Current Exercise Habits: The patient does not participate in regular exercise at present, Exercise limited by: orthopedic condition(s);neurologic condition(s)   Depression Screen [place] [place] 2/9 Scores 12/21/2017 12/10/2017 10/14/2017 06/12/2017 03/11/2017 02/19/2017 02/19/2017  PHQ - 2 Score  0 0 2 4 4 4 4   PHQ- 9 Score - - 10 12 - 10 10     Fall Risk Fall Risk  12/21/2017 12/10/2017 10/14/2017 10/01/2017 03/11/2017  Falls in the past year? Yes Yes Yes No Yes  Number falls in past yr: 2 or more 2 or more 2 or more - 2 or more  Injury with Fall? No No No - No  Risk Factor Category  High Fall Risk High Fall Risk High Fall  Risk - -  Risk for fall due to : Impaired balance/gait;Impaired mobility;History of fall(s) - - - -  Follow up Falls prevention discussed - - - -    Safety Is the patient's home free of loose throw rugs in walkways, pet beds, electrical cords, etc?   yes      Handrails on the stairs?   yes      Adequate lighting?   yes  Patient Care Team: Theodoro Clock as PCP - General (General Practice) Kathrynn Ducking, MD as Consulting Physician (Neurology) Rogene Houston, MD as Consulting Physician (Gastroenterology) Luanne Bras, MD as Consulting Physician (Interventional Radiology) Daneil Dolin, MD as Consulting Physician (Gastroenterology) Gala Romney Cristopher Estimable, MD as Consulting Physician (Gastroenterology)  Hospitalizations, surgeries, and ER visits in previous 12 months CVA 02/2017 ED to hospital admission Objective:    Today's Vitals   12/21/17 1516  BP: (!) 166/69  Pulse: 67  Weight: 166 lb (75.3 kg)  Height: 5' 0.5" (1.537 m)   Body mass index is 31.89 kg/m.  Advanced Directives 12/21/2017 04/11/2017 04/10/2017 04/09/2017 03/04/2017 05/05/2016 03/30/2015  Does Patient Have a Medical Advance Directive? No No No No No No No  Would patient like information on creating a medical advance directive? Yes (MAU/Ambulatory/Procedural Areas - Information given) No - Patient declined No - Patient declined No - Patient declined Yes (Inpatient - patient requests chaplain consult to create a medical advance directive) Yes (MAU/Ambulatory/Procedural Areas - Information given) -    Hearing/Vision  No hearing or vision deficits noted during visit.  Cognitive Function: MMSE - Mini Mental State Exam 12/21/2017 05/05/2016  Orientation to time 5 5  Orientation to Place 5 5  Registration 3 3  Attention/ Calculation 5 5  Recall 3 3  Language- name 2 objects 2 2  Language- repeat 1 1  Language- follow 3 step command 3 3  Language- read & follow direction 1 1  Write a sentence 1 1  Copy  design 1 1  Total score 30 30       Normal Cognitive Function Screening: Yes    Immunizations and Health Maintenance Immunization History  Administered Date(s) Administered  . Pneumococcal Conjugate-13 06/09/2016  . Pneumococcal Polysaccharide-23 06/23/2017  . Pneumococcal-Unspecified 06/23/2017  . Zoster Recombinat (Shingrix) 10/16/2017   Health Maintenance Due  Topic Date Due  . OPHTHALMOLOGY EXAM  09/04/2017  . INFLUENZA VACCINE  11/12/2017   Health Maintenance  Topic Date Due  . OPHTHALMOLOGY EXAM  09/04/2017  . INFLUENZA VACCINE  11/12/2017  . TETANUS/TDAP  10/15/2018 (Originally 09/30/1964)  . HEMOGLOBIN A1C  04/16/2018  . FOOT EXAM  10/15/2018  . COLONOSCOPY  10/25/2018  . URINE MICROALBUMIN  12/11/2018  . MAMMOGRAM  11/20/2019  . DEXA SCAN  Completed  . Hepatitis C Screening  Completed  . PNA vac Low Risk Adult  Completed        Assessment:   This is a routine wellness examination for Gara.    Plan:  Goals    . DIET - REDUCE SALT INTAKE TO 2 GRAMS PER DAY OR LESS    . Exercise 150 min/wk Moderate Activity    . Have 3 meals a day        Health Maintenance Recommendations: Nutrition counseling  Advanced Directives given and discussed  Schedule eye exam   Additional Screening Recommendations: Lung: Low Dose CT Chest recommended if Age 8-80 years, 30 pack-year currently smoking OR have quit w/in 15years. Patient does not qualify. Hepatitis C Screening recommended: no  Keep f/u with Terald Sleeper, PA-C and any other specialty appointments you may have  Continue current medications Move carefully to avoid falls. Continue to use cane.  Aim for at least 150 minutes of moderate activity a week. Find a buddy to walk with daily. Start out slowly. Walk for 10 minutes at first and then slowly increase as tolerated.  Read or work on puzzles daily Stay connected with friends and family  I have personally reviewed and noted the following in the  patient's chart:   . Medical and social history . Use of alcohol, tobacco or illicit drugs  . Current medications and supplements . Functional ability and status . Nutritional status . Physical activity . Advanced directives . List of other physicians . Hospitalizations, surgeries, and ER visits in previous 12 months . Vitals . Screenings to include cognitive, depression, and falls . Referrals and appointments  In addition, I have reviewed and discussed with patient certain preventive protocols, quality metrics, and best practice recommendations. A written personalized care plan for preventive services as well as general preventive health recommendations were provided to patient.     Chong Sicilian, RN   12/21/2017    I have reviewed and agree with the above AWV documentation.   Terald Sleeper PA-C [facility] [address]  [place], [place] [address] [phone]

## 2017-12-21 NOTE — Patient Instructions (Signed)
  Paula Watson , Thank you for taking time to come for your Medicare Wellness Visit. I appreciate your ongoing commitment to your health goals. Please review the following plan we discussed and let me know if I can assist you in the future.   These are the goals we discussed: Goals    . DIET - REDUCE SALT INTAKE TO 2 GRAMS PER DAY OR LESS    . Exercise 150 min/wk Moderate Activity    . Have 3 meals a day       This is a list of the screening recommended for you and due dates:  Health Maintenance  Topic Date Due  . Eye exam for diabetics  09/04/2017  . Flu Shot  11/12/2017  . Tetanus Vaccine  10/15/2018*  . Hemoglobin A1C  04/16/2018  . Complete foot exam   10/15/2018  . Colon Cancer Screening  10/25/2018  . Urine Protein Check  12/11/2018  . Mammogram  11/20/2019  . DEXA scan (bone density measurement)  Completed  .  Hepatitis C: One time screening is recommended by Center for Disease Control  (CDC) for  adults born from [birth year] through 1965.   Completed  . Pneumonia vaccines  Completed  *Topic was postponed. The date shown is not the original due date.

## 2018-01-05 DIAGNOSIS — I5020 Unspecified systolic (congestive) heart failure: Secondary | ICD-10-CM | POA: Diagnosis not present

## 2018-01-05 DIAGNOSIS — D649 Anemia, unspecified: Secondary | ICD-10-CM | POA: Diagnosis not present

## 2018-01-05 DIAGNOSIS — I10 Essential (primary) hypertension: Secondary | ICD-10-CM | POA: Diagnosis not present

## 2018-01-05 DIAGNOSIS — N184 Chronic kidney disease, stage 4 (severe): Secondary | ICD-10-CM | POA: Diagnosis not present

## 2018-01-05 DIAGNOSIS — R809 Proteinuria, unspecified: Secondary | ICD-10-CM | POA: Diagnosis not present

## 2018-01-07 ENCOUNTER — Ambulatory Visit (INDEPENDENT_AMBULATORY_CARE_PROVIDER_SITE_OTHER): Payer: Medicare Other | Admitting: Physician Assistant

## 2018-01-07 ENCOUNTER — Encounter: Payer: Self-pay | Admitting: Physician Assistant

## 2018-01-07 VITALS — BP 138/65 | HR 68 | Temp 97.4°F | Ht 60.0 in | Wt 165.0 lb

## 2018-01-07 DIAGNOSIS — N183 Chronic kidney disease, stage 3 unspecified: Secondary | ICD-10-CM

## 2018-01-07 DIAGNOSIS — E1129 Type 2 diabetes mellitus with other diabetic kidney complication: Secondary | ICD-10-CM | POA: Diagnosis not present

## 2018-01-07 DIAGNOSIS — R809 Proteinuria, unspecified: Secondary | ICD-10-CM

## 2018-01-07 MED ORDER — FUROSEMIDE 20 MG PO TABS: 20.0000 mg | ORAL_TABLET | Freq: Every day | ORAL | 3 refills | Status: AC

## 2018-01-07 NOTE — Patient Instructions (Signed)
In a few days you may receive a survey in the mail or online from [employer] regarding your visit with us today. Please take a moment to fill this out. Your feedback is very important to our whole office. It can help us better understand your needs as well as improve your experience and satisfaction. Thank you for taking your time to complete it. We care about you.  Sianne Tejada, PA-C  

## 2018-01-11 NOTE — Progress Notes (Signed)
BP 138/65   Pulse 68   Temp (!) 97.4 F (36.3 C) (Oral)   Ht 5' (1.524 m)   Wt 165 lb (74.8 kg)   BMI 32.22 kg/m    Subjective:    Patient ID: Paula Watson, female    DOB: 13-Oct-1945, 72 y.o.   MRN: [id number]  HPI: Paula Watson is a 72 y.o. female presenting on 01/07/2018 for Hypertension  This patient comes in for chronic recheck on her chronic medical conditions.  She does have hypertension, chronic kidney disease.  She has had a great increase in her proteinuria over the past few months.  She is seeing nephrology at this time for this.  A lot of testing that is can be going on.  She states she has to do a 24-hour collection in the future.  All of her medications are reviewed.  At this time we are going to stop the metformin just in case it is aggravating her kidneys also.  She has taken it for a very long time.  Her diabetes is very well controlled and she does a good job with her dietary choices.  We will just monitor and in 3 months if her A1c has elevated we will use another medication that is not nephrotoxic.  There are no other issues at this time.   Past Medical History:  Diagnosis Date  . Anemia   . Aneurysm of artery ([place])   . Blood transfusion   . Bronchitis    "have had a couple of times in my lifetime"  . Cancer ([place]) 1985   cervical  . Cataract   . CKD (chronic kidney disease) stage 1, GFR 90 ml/min or greater was told by her PCP   Intolerant to ACE according to her PCP  . Diabetes mellitus    tyle 2 diabetic  . Dyspnea   . Dysrhythmia    :"SKIPS A BEAT "   SCHEDULED FOR STRESS TEST 2019  . GERD (gastroesophageal reflux disease)   . High cholesterol   . History of hiatal hernia   . History of kidney stones   . Hyperlipidemia   . Hypertension   . Poliomyelitis since 1948   has drop foot on R  . Stroke [facility]) 2007 affected R side  . Vertigo now improved   Relevant past medical, surgical, family and social history reviewed and updated as indicated.  Interim medical history since our last visit reviewed. Allergies and medications reviewed and updated. DATA REVIEWED: CHART IN EPIC  Family History reviewed for pertinent findings.  Review of Systems  Constitutional: Negative.   HENT: Negative.   Eyes: Negative.   Respiratory: Negative.   Gastrointestinal: Negative.   Genitourinary: Negative.   Musculoskeletal: Positive for arthralgias, joint swelling and myalgias.    Allergies as of 01/07/2018      Reactions   Penicillins Hives, Swelling   PATIENT HAS HAD A PCN REACTION WITH IMMEDIATE RASH, FACIAL/TONGUE/THROAT SWELLING, SOB, OR LIGHTHEADEDNESS WITH HYPOTENSION:  #  #  #  YES  #  #  #   Has patient had a PCN reaction causing severe rash involving mucus membranes or skin necrosis: Unknown Has patient had a PCN reaction that required hospitalization: No Has patient had a PCN reaction occurring within the last 10 years: No      Medication List        Accurate as of 01/07/18 11:59 PM. Always use your most recent med list.  albuterol 108 (90 Base) MCG/ACT inhaler Commonly known as:  PROVENTIL HFA;VENTOLIN HFA Inhale 2 puffs into the lungs every 6 (six) hours as needed for wheezing or shortness of breath.   amLODipine 10 MG tablet Commonly known as:  NORVASC Take 1 tablet (10 mg total) by mouth daily.   aspirin 325 MG EC tablet Take 1 tablet (325 mg total) by mouth daily.   carvedilol 12.5 MG tablet Commonly known as:  COREG Take 1 tablet (12.5 mg total) by mouth 2 (two) times daily with a meal.   clopidogrel 75 MG tablet Commonly known as:  PLAVIX Take 1 tablet (75 mg total) by mouth daily.   clotrimazole-betamethasone lotion Commonly known as:  LOTRISONE Apply topically 2 (two) times daily.   fluticasone 50 MCG/ACT nasal spray Commonly known as:  FLONASE Place 1 spray into both nostrils 2 (two) times daily.   furosemide 20 MG tablet Commonly known as:  LASIX Take 1-2 tablets (20-40 mg total) by mouth  daily.   hydrocortisone 2.5 % rectal cream Commonly known as:  ANUSOL-HC Place 1 application rectally 2 (two) times daily.   linaclotide 145 MCG Caps capsule Commonly known as:  LINZESS Take 1 capsule (145 mcg total) by mouth daily before breakfast.   mupirocin cream 2 % Commonly known as:  BACTROBAN Apply 1 application topically 2 (two) times daily.   nicotine polacrilex 4 MG gum Commonly known as:  NICORETTE Take 2 mg by mouth as needed for smoking cessation.   pantoprazole 20 MG tablet Commonly known as:  PROTONIX Take 1 tablet (20 mg total) by mouth daily.   pravastatin 20 MG tablet Commonly known as:  PRAVACHOL Take 1 tablet (20 mg total) by mouth daily.   VESICARE 5 MG tablet Generic drug:  solifenacin Take 5 mg by mouth at bedtime.          Objective:    BP 138/65   Pulse 68   Temp (!) 97.4 F (36.3 C) (Oral)   Ht 5' (1.524 m)   Wt 165 lb (74.8 kg)   BMI 32.22 kg/m   Allergies  Allergen Reactions  . Penicillins Hives and Swelling    PATIENT HAS HAD A PCN REACTION WITH IMMEDIATE RASH, FACIAL/TONGUE/THROAT SWELLING, SOB, OR LIGHTHEADEDNESS WITH HYPOTENSION:  #  #  #  YES  #  #  #   Has patient had a PCN reaction causing severe rash involving mucus membranes or skin necrosis: Unknown Has patient had a PCN reaction that required hospitalization: No Has patient had a PCN reaction occurring within the last 10 years: No     Wt Readings from Last 3 Encounters:  01/07/18 165 lb (74.8 kg)  12/21/17 166 lb (75.3 kg)  12/10/17 163 lb 9.6 oz (74.2 kg)    Physical Exam  Constitutional: She is oriented to person, place, and time. She appears well-developed and well-nourished.  HENT:  Head: Normocephalic and atraumatic.  Eyes: Pupils are equal, round, and reactive to light. Conjunctivae and EOM are normal.  Cardiovascular: Normal rate, regular rhythm, normal heart sounds and intact distal pulses.  Pulmonary/Chest: Effort normal and breath sounds normal.    Abdominal: Soft. Bowel sounds are normal.  Neurological: She is alert and oriented to person, place, and time. She has normal reflexes.  Skin: Skin is warm and dry. No rash noted.  Psychiatric: She has a normal mood and affect. Her behavior is normal. Judgment and thought content normal.    Results for orders placed or performed in  visit on 12/10/17  CMP14+EGFR  Result Value Ref Range   Glucose 107 (H) 65 - 99 mg/dL   BUN 16 8 - 27 mg/dL   Creatinine, Ser 1.67 (H) 0.57 - 1.00 mg/dL   GFR calc non Af Amer 30 (L) >59 mL/min/1.73   GFR calc Af Amer 35 (L) >59 mL/min/1.73   BUN/Creatinine Ratio 10 (L) 12 - 28   Sodium 143 134 - 144 mmol/L   Potassium 3.3 (L) 3.5 - 5.2 mmol/L   Chloride 102 96 - 106 mmol/L   CO2 24 20 - 29 mmol/L   Calcium 8.3 (L) 8.7 - 10.3 mg/dL   Total Protein 6.0 6.0 - 8.5 g/dL   Albumin 3.6 3.5 - 4.8 g/dL   Globulin, Total 2.4 1.5 - 4.5 g/dL   Albumin/Globulin Ratio 1.5 1.2 - 2.2   Bilirubin Total 0.3 0.0 - 1.2 mg/dL   Alkaline Phosphatase 98 39 - 117 IU/L   AST 17 0 - 40 IU/L   ALT 11 0 - 32 IU/L  CBC with Differential/Platelet  Result Value Ref Range   WBC 10.9 (H) 3.4 - 10.8 x10E3/uL   RBC 4.44 3.77 - 5.28 x10E6/uL   Hemoglobin 11.7 11.1 - 15.9 g/dL   Hematocrit 36.3 34.0 - 46.6 %   MCV 82 79 - 97 fL   MCH 26.4 (L) 26.6 - 33.0 pg   MCHC 32.2 31.5 - 35.7 g/dL   RDW 15.1 12.3 - 15.4 %   Platelets 365 150 - 450 x10E3/uL   Neutrophils 64 Not Estab. %   Lymphs 24 Not Estab. %   Monocytes 9 Not Estab. %   Eos 3 Not Estab. %   Basos 0 Not Estab. %   Neutrophils Absolute 6.9 1.4 - 7.0 x10E3/uL   Lymphocytes Absolute 2.6 0.7 - 3.1 x10E3/uL   Monocytes Absolute 1.0 (H) 0.1 - 0.9 x10E3/uL   EOS (ABSOLUTE) 0.4 0.0 - 0.4 x10E3/uL   Basophils Absolute 0.0 0.0 - 0.2 x10E3/uL   Immature Granulocytes 0 Not Estab. %   Immature Grans (Abs) 0.0 0.0 - 0.1 x10E3/uL  Microalbumin / creatinine urine ratio  Result Value Ref Range   Creatinine, Urine 67.7 Not Estab.  mg/dL   Microalbumin, Urine 2,772.5 Not Estab. ug/mL   Microalb/Creat Ratio 4,095.3 (H) 0.0 - 30.0 mg/g creat      Assessment & Plan:   1. Proteinuria, unspecified type Follow with nephrology Stop metformin  2. Controlled type 2 diabetes mellitus with microalbuminuria, without long-term current use of insulin (HCC) Follow with nephrology Stop metformin Recheck A1c 3 months  3. CKD (chronic kidney disease) stage 3, GFR 30-59 ml/min (HCC) - furosemide (LASIX) 20 MG tablet; Take 1-2 tablets (20-40 mg total) by mouth daily.  Dispense: 180 tablet; Refill: 3   Continue all other maintenance medications as listed above.  Follow up plan: Return in about 3 months (around 04/08/2018) for recheck.  Educational handout given for [place] PA-C [facility] [address]  [place], [place] [address] [phone]   01/11/2018, 3:23 PM

## 2018-01-15 ENCOUNTER — Other Ambulatory Visit: Payer: Self-pay

## 2018-01-15 ENCOUNTER — Other Ambulatory Visit: Payer: Medicare Other

## 2018-01-15 ENCOUNTER — Encounter: Payer: Self-pay | Admitting: Gastroenterology

## 2018-01-15 ENCOUNTER — Ambulatory Visit (INDEPENDENT_AMBULATORY_CARE_PROVIDER_SITE_OTHER): Payer: Medicare Other | Admitting: Gastroenterology

## 2018-01-15 DIAGNOSIS — K219 Gastro-esophageal reflux disease without esophagitis: Secondary | ICD-10-CM

## 2018-01-15 DIAGNOSIS — D509 Iron deficiency anemia, unspecified: Secondary | ICD-10-CM | POA: Diagnosis not present

## 2018-01-15 DIAGNOSIS — Z1159 Encounter for screening for other viral diseases: Secondary | ICD-10-CM | POA: Diagnosis not present

## 2018-01-15 DIAGNOSIS — K625 Hemorrhage of anus and rectum: Secondary | ICD-10-CM

## 2018-01-15 DIAGNOSIS — E559 Vitamin D deficiency, unspecified: Secondary | ICD-10-CM | POA: Diagnosis not present

## 2018-01-15 DIAGNOSIS — Z79899 Other long term (current) drug therapy: Secondary | ICD-10-CM | POA: Diagnosis not present

## 2018-01-15 DIAGNOSIS — Z860101 Personal history of adenomatous and serrated colon polyps: Secondary | ICD-10-CM

## 2018-01-15 DIAGNOSIS — Z86010 Personal history of colonic polyps: Secondary | ICD-10-CM | POA: Diagnosis not present

## 2018-01-15 DIAGNOSIS — I10 Essential (primary) hypertension: Secondary | ICD-10-CM | POA: Diagnosis not present

## 2018-01-15 DIAGNOSIS — R809 Proteinuria, unspecified: Secondary | ICD-10-CM | POA: Diagnosis not present

## 2018-01-15 DIAGNOSIS — N183 Chronic kidney disease, stage 3 (moderate): Secondary | ICD-10-CM | POA: Diagnosis not present

## 2018-01-15 MED ORDER — PEG 3350-KCL-NA BICARB-NACL 420 G PO SOLR: 4000.0000 mL | ORAL | 0 refills | Status: DC

## 2018-01-15 NOTE — Patient Instructions (Signed)
1. Colonoscopy as scheduled. See separate instructions.  2. Continue pantoprazole daily. We will have you follow up in four months.

## 2018-01-15 NOTE — Progress Notes (Signed)
Primary Care Physician:  Paula Sleeper, PA-C  Primary Gastroenterologist:  Paula Cornea, MD   Chief Complaint  Patient presents with  . Consult    TSC-last done 2010  . Gastroesophageal Reflux    currently not having an issue    HPI:  Paula Watson is a 71 y.o. female here at the request of Paula Nearing, PA-C for evaluation of GERD, need for colonoscopy.  Patient has history of chronic GERD, previously well managed on Pepcid.  Recently was transitioned to pantoprazole for refractory symptoms.  Has been doing well for the past 2 to 3 months.  No alarm symptoms such as dysphagia, weight loss.  Her appetite is good.  Bowel movements have been regular over the past 6 months.  After she had her stroke last year and kidney stones, requiring opioids, she developed constipation which took a couple months to get back to normal.  She utilize Linzess at the time.  She has been having regular bowel movements for the past 6 months, last week her metformin was stopped.  She noted some constipation issues.  For 3 to 4 weeks she has had some bright red blood per rectum.  She wonders if it is hemorrhoid related.  Denies rectal pain.  She also has some vague abdominal pain, unrelated to meals.  Comes and goes.  Not necessarily related to BMs.  She notes some improvement since stopping metformin last week.  Patient's last colonoscopy was in 2010 with Dr. Laural Watson.  Patient's sister is established with our office and she preferred to come here for her colonoscopy.  Current Outpatient Medications  Medication Sig Dispense Refill  . albuterol (PROVENTIL HFA;VENTOLIN HFA) 108 (90 Base) MCG/ACT inhaler Inhale 2 puffs into the lungs every 6 (six) hours as needed for wheezing or shortness of breath. 1 Inhaler 6  . amLODipine (NORVASC) 10 MG tablet Take 1 tablet (10 mg total) by mouth daily. 30 tablet 5  . aspirin EC 325 MG EC tablet Take 1 tablet (325 mg total) by mouth daily. 30 tablet 0  . carvedilol (COREG) 12.5 MG  tablet Take 1 tablet (12.5 mg total) by mouth 2 (two) times daily with a meal. 180 tablet 3  . clopidogrel (PLAVIX) 75 MG tablet Take 1 tablet (75 mg total) by mouth daily. 90 tablet 3  . clotrimazole-betamethasone (LOTRISONE) lotion Apply topically 2 (two) times daily. 30 mL 11  . doxazosin (CARDURA) 2 MG tablet Take 1 tablet by mouth daily.  1  . fluticasone (FLONASE) 50 MCG/ACT nasal spray Place 1 spray into both nostrils 2 (two) times daily. 16 g 11  . furosemide (LASIX) 20 MG tablet Take 1-2 tablets (20-40 mg total) by mouth daily. 180 tablet 3  . hydrocortisone (ANUSOL-HC) 2.5 % rectal cream Place 1 application rectally 2 (two) times daily. (Patient taking differently: Place 1 application rectally 2 (two) times daily as needed (IRRITATION). ) 30 g 0  . linaclotide (LINZESS) 145 MCG CAPS capsule Take 1 capsule (145 mcg total) by mouth daily before breakfast. 30 capsule 11  . mupirocin cream (BACTROBAN) 2 % Apply 1 application topically 2 (two) times daily. (Patient taking differently: Apply 1 application topically 2 (two) times daily as needed (pressure callus). ) 15 g 0  . pantoprazole (PROTONIX) 20 MG tablet Take 1 tablet (20 mg total) by mouth daily. 30 tablet 5  . pravastatin (PRAVACHOL) 20 MG tablet Take 1 tablet (20 mg total) by mouth daily. 30 tablet 11  . VESICARE 5 MG  tablet Take 5 mg by mouth at bedtime.  6   No current facility-administered medications for this visit.     Allergies as of 01/15/2018 - Review Complete 01/15/2018  Allergen Reaction Noted  . Penicillins Hives and Swelling 02/19/2011    Past Medical History:  Diagnosis Date  . Anemia   . Aneurysm of artery ([place])   . Blood transfusion   . Bronchitis    "have had a couple of times in my lifetime"  . Cancer ([place]) 1985   cervical  . Cataract   . CKD (chronic kidney disease) stage 1, GFR 90 ml/min or greater was told by her PCP   Intolerant to ACE according to her PCP  . Diabetes mellitus    tyle 2 diabetic   . Dyspnea   . Dysrhythmia    :"SKIPS A BEAT "   SCHEDULED FOR STRESS TEST 2019  . GERD (gastroesophageal reflux disease)   . High cholesterol   . History of hiatal hernia   . History of kidney stones   . Hyperlipidemia   . Hypertension   . Poliomyelitis since 1948   has drop foot on R  . Stroke [facility]) 2007 affected R side  . Vertigo now improved    Past Surgical History:  Procedure Laterality Date  . CAROTID ENDARTERECTOMY Left 2000 L side  . CERVICAL CONIZATION W/BX  1985  . COLONOSCOPY  2010   Dr. Laural Watson: tubular adenoma.   . CORONARY ANGIOPLASTY  stented 2007  . DILATION AND CURETTAGE OF UTERUS  1980's  . foot reconstructjion  1953   right foot; post polio  . IR ANGIO EXTERNAL CAROTID SEL EXT CAROTID UNI R MOD SED  03/06/2017  . IR ANGIO INTRA EXTRACRAN SEL COM CAROTID INNOMINATE UNI R MOD SED  03/24/2017  . IR ANGIO VERTEBRAL SEL SUBCLAVIAN INNOMINATE BILAT MOD SED  03/09/2017  . IR ANGIO VERTEBRAL SEL VERTEBRAL BILAT MOD SED  03/06/2017  . IR RADIOLOGIST EVAL & MGMT  07/21/2016  . RADIOLOGY WITH ANESTHESIA N/A 03/24/2017   Procedure: STENTING;  Surgeon: Luanne Bras, MD;  Location: [place];  Service: Radiology;  Laterality: N/A;  . TONSILLECTOMY  1954    Family History  Problem Relation Age of Onset  . Coronary artery disease Mother   . Heart disease Mother   . Stroke Mother   . Diabetes type II Father   . Cancer Father        brain stem  . Brain cancer Father   . Diabetes Father   . Cancer Sister        breast with recurrance  . Diabetes Sister   . Stroke Sister   . Breast cancer Sister   . Coronary artery disease Brother   . Healthy Daughter   . COPD Maternal Grandmother        breast  . Breast cancer Maternal Grandmother   . COPD Paternal Grandmother        breast  . Breast cancer Paternal Grandmother   . Obesity Sister   . Heart disease Brother   . Heart attack Brother   . Lung cancer Brother   . COPD Other   . Breast cancer Other   . Stroke  Paternal Grandfather   . Drug abuse Son   . Alcohol abuse Son   . Colon cancer Other        passed from colon cancer    Social History   Socioeconomic History  . Marital status: Divorced  Spouse name: Not on file  . Number of children: 2  . Years of education: 75  . Highest education level: 12th grade  Occupational History  . 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 Financial resource strain: Somewhat hard  . Food insecurity:    Worry: Never true    Inability: Never true  . Transportation needs:    Medical: No    Non-medical: No  Tobacco Use  . Smoking status: Former Smoker    Packs/day: 0.25    Years: 50.00    Pack years: 12.50    Types: Cigarettes    Last attempt to quit: 03/03/2017    Years since quitting: 0.8  . Smokeless tobacco: Never Used  Substance and Sexual Activity  . Alcohol use: No  . Drug use: No  . Sexual activity: Not Currently    Birth control/protection: Post-menopausal  Lifestyle  . Physical activity:    Days per week: 0 days    Minutes per session: 0 min  . Stress: Rather much  Relationships  . Social connections:    Talks on phone: More than three times a week    Gets together: More than three times a week    Attends religious service: Never    Active member of club or organization: No    Attends meetings of clubs or organizations: Never    Relationship status: Divorced  . Intimate partner violence:    Fear of current or ex partner: No    Emotionally abused: No    Physically abused: No    Forced sexual activity: No  Other Topics Concern  . Not on file  Social History Narrative  . Not on file      ROS:  General: Negative for anorexia, weight loss, fever, chills, fatigue, weakness. Eyes: Negative for vision changes.  ENT: Negative for hoarseness, difficulty swallowing , nasal congestion. CV: Negative for chest pain, angina, palpitations, dyspnea on exertion, +peripheral  edema.  Respiratory: Negative for dyspnea at rest, dyspnea on exertion, cough, sputum, wheezing.  GI: See history of present illness. GU:  Negative for dysuria, hematuria, urinary incontinence, urinary frequency, nocturnal urination.  MS: Negative for joint pain, low back pain.  Derm: Negative for rash or itching.  Neuro: Negative for weakness, abnormal sensation, seizure, frequent headaches, memory loss, confusion.  Psych: Negative for anxiety, depression, suicidal ideation, hallucinations.  Endo: Negative for unusual weight change.  Heme: Negative for bruising or bleeding. Allergy: Negative for rash or hives.    Physical Examination:  BP (!) 153/65   Pulse 70   Temp (!) 97 F (36.1 C) (Oral)   Ht 5\' 2"  (1.575 m)   Wt 169 lb (76.7 kg)   BMI 30.91 kg/m    General: Well-nourished, well-developed in no acute distress.  Head: Normocephalic, atraumatic.   Eyes: Conjunctiva pink, no icterus. Mouth: Oropharyngeal mucosa moist and pink , no lesions erythema or exudate. Neck: Supple without thyromegaly, masses, or lymphadenopathy.  Lungs: Clear to auscultation bilaterally.  Heart: Regular rate and rhythm, no murmurs rubs or gallops.  Abdomen: Bowel sounds are normal, nontender, nondistended, no hepatosplenomegaly or masses, no abdominal bruits or    hernia , no rebound or guarding.   Rectal: not performed Extremities: No lower extremity edema. Left lower extremity brace.  Neuro: Alert and oriented x 4 , grossly normal neurologically.  Skin: Warm and dry, no rash or jaundice.   Psych: Alert and cooperative, normal  mood and affect.  Labs: Lab Results  Component Value Date   CREATININE 1.67 (H) 12/10/2017   BUN 16 12/10/2017   NA 143 12/10/2017   K 3.3 (L) 12/10/2017   CL 102 12/10/2017   CO2 24 12/10/2017   Lab Results  Component Value Date   ALT 11 12/10/2017   AST 17 12/10/2017   ALKPHOS 98 12/10/2017   BILITOT 0.3 12/10/2017   Lab Results  Component Value Date   WBC  10.9 (H) 12/10/2017   HGB 11.7 12/10/2017   HCT 36.3 12/10/2017   MCV 82 12/10/2017   PLT 365 12/10/2017     Imaging Studies: No results found.

## 2018-01-15 NOTE — Assessment & Plan Note (Addendum)
Chronic GERD with recent flare.  Switch to pantoprazole 2 months ago now with good control of her symptoms.  Taking pantoprazole 40 mg daily.  Reinforced antireflux measures.  Vague abdominal pain with improvement in the past 1 week off metformin.  Return to the office in 4 months to reevaluate abdominal pain and reflux.Marland Kitchen

## 2018-01-15 NOTE — Assessment & Plan Note (Signed)
History of adenomatous colon polyp, 2010.  2 years late for surveillance colonoscopy.  3 to 4-week history of bright red blood per rectum.  Colonoscopy in the near future.  I have discussed the risks, alternatives, benefits with regards to but not limited to the risk of reaction to medication, bleeding, infection, perforation and the patient is agreeable to proceed. Written consent to be obtained.

## 2018-01-18 ENCOUNTER — Other Ambulatory Visit (HOSPITAL_COMMUNITY): Payer: Self-pay | Admitting: Medical

## 2018-01-18 DIAGNOSIS — N183 Chronic kidney disease, stage 3 unspecified: Secondary | ICD-10-CM

## 2018-01-18 NOTE — Progress Notes (Signed)
CC'D TO PCP °

## 2018-01-28 ENCOUNTER — Ambulatory Visit (HOSPITAL_COMMUNITY)
Admission: RE | Admit: 2018-01-28 | Discharge: 2018-01-28 | Disposition: A | Discharge status: Home / Self Care | Payer: Medicare Other | Source: Ambulatory Visit | Attending: Medical | Admitting: Medical

## 2018-01-28 ENCOUNTER — Other Ambulatory Visit (HOSPITAL_COMMUNITY): Payer: Self-pay | Admitting: Medical

## 2018-01-28 DIAGNOSIS — N281 Cyst of kidney, acquired: Secondary | ICD-10-CM | POA: Diagnosis not present

## 2018-01-28 DIAGNOSIS — N183 Chronic kidney disease, stage 3 unspecified: Secondary | ICD-10-CM

## 2018-01-28 DIAGNOSIS — N261 Atrophy of kidney (terminal): Secondary | ICD-10-CM | POA: Diagnosis not present

## 2018-01-29 ENCOUNTER — Encounter: Payer: Self-pay | Admitting: *Deleted

## 2018-02-02 DIAGNOSIS — I10 Essential (primary) hypertension: Secondary | ICD-10-CM | POA: Diagnosis not present

## 2018-02-02 DIAGNOSIS — N200 Calculus of kidney: Secondary | ICD-10-CM | POA: Diagnosis not present

## 2018-02-02 DIAGNOSIS — R809 Proteinuria, unspecified: Secondary | ICD-10-CM | POA: Diagnosis not present

## 2018-02-02 DIAGNOSIS — N184 Chronic kidney disease, stage 4 (severe): Secondary | ICD-10-CM | POA: Diagnosis not present

## 2018-02-02 DIAGNOSIS — E1121 Type 2 diabetes mellitus with diabetic nephropathy: Secondary | ICD-10-CM | POA: Diagnosis not present

## 2018-02-05 ENCOUNTER — Encounter (HOSPITAL_COMMUNITY): Payer: Self-pay

## 2018-02-05 ENCOUNTER — Encounter (HOSPITAL_COMMUNITY)
Admission: RE | Admit: 2018-02-05 | Discharge: 2018-02-05 | Disposition: A | Discharge status: Home / Self Care | Payer: Medicare Other | Source: Ambulatory Visit | Attending: Nephrology | Admitting: Nephrology

## 2018-02-05 DIAGNOSIS — H524 Presbyopia: Secondary | ICD-10-CM | POA: Diagnosis not present

## 2018-02-05 DIAGNOSIS — H52223 Regular astigmatism, bilateral: Secondary | ICD-10-CM | POA: Diagnosis not present

## 2018-02-05 DIAGNOSIS — D509 Iron deficiency anemia, unspecified: Secondary | ICD-10-CM | POA: Diagnosis not present

## 2018-02-05 DIAGNOSIS — H5203 Hypermetropia, bilateral: Secondary | ICD-10-CM | POA: Diagnosis not present

## 2018-02-05 DIAGNOSIS — H25813 Combined forms of age-related cataract, bilateral: Secondary | ICD-10-CM | POA: Diagnosis not present

## 2018-02-05 MED ORDER — SODIUM CHLORIDE 0.9 % IV SOLN
Freq: Once | INTRAVENOUS | Status: AC
  Administered 2018-02-05: 12:00:00 via INTRAVENOUS

## 2018-02-05 MED ORDER — SODIUM CHLORIDE 0.9 % IV SOLN
510.0000 mg | Freq: Once | INTRAVENOUS | Status: AC
  Administered 2018-02-05: 510 mg via INTRAVENOUS
  Filled 2018-02-05: qty 17

## 2018-02-05 NOTE — Discharge Instructions (Signed)

## 2018-02-05 NOTE — Progress Notes (Signed)
Patient tolerated procedure without s/s of reaction. Educational material provided and discussed.  Verbalized understanding.

## 2018-02-09 ENCOUNTER — Telehealth: Payer: Self-pay | Admitting: Physician Assistant

## 2018-02-10 NOTE — Telephone Encounter (Signed)
Patient aware and verbalizes understanding. 

## 2018-02-10 NOTE — Telephone Encounter (Signed)
General side effects, so report to Dr. Lowanda Watson if anything occurs.  If is needed for her kidney and bone disease and stones.

## 2018-02-10 NOTE — Telephone Encounter (Signed)
Pt was placed on [place] by her kidney Dr. Aletha Halim this affect any of her other meds? Or any side effects?

## 2018-02-12 ENCOUNTER — Encounter (HOSPITAL_COMMUNITY): Payer: Self-pay

## 2018-02-12 ENCOUNTER — Encounter (HOSPITAL_COMMUNITY)
Admission: RE | Admit: 2018-02-12 | Discharge: 2018-02-12 | Disposition: A | Discharge status: Home / Self Care | Payer: Medicare Other | Source: Ambulatory Visit | Attending: Nephrology | Admitting: Nephrology

## 2018-02-12 DIAGNOSIS — D509 Iron deficiency anemia, unspecified: Secondary | ICD-10-CM | POA: Insufficient documentation

## 2018-02-12 MED ORDER — SODIUM CHLORIDE 0.9 % IV SOLN
Freq: Once | INTRAVENOUS | Status: AC
  Administered 2018-02-12: 11:00:00 via INTRAVENOUS

## 2018-02-12 MED ORDER — SODIUM CHLORIDE 0.9 % IV SOLN
510.0000 mg | Freq: Once | INTRAVENOUS | Status: AC
  Administered 2018-02-12: 510 mg via INTRAVENOUS
  Filled 2018-02-12: qty 17

## 2018-02-16 ENCOUNTER — Other Ambulatory Visit: Payer: Self-pay

## 2018-02-16 NOTE — Patient Outreach (Signed)
[facility]) Care Management  02/16/2018  BASMA BUCHNER 05-Mar-1946 [id number]   Medication Adherence call to Mrs. Taquilla Burandt left a message for patient to call back patient is due on Metformin ER 500 mg.Mrs. Minahan is showing past due under [facility].   [facility] Direct Dial [phone]  Fax [phone] [email or web address]

## 2018-03-02 ENCOUNTER — Telehealth: Payer: Self-pay

## 2018-03-02 NOTE — Telephone Encounter (Signed)
Agree with advice given

## 2018-03-02 NOTE — Telephone Encounter (Signed)
Pt said she has been reviewing her instructions for her colonoscopy. It said to call if you are on blood thinners. She called to say she is on ASA and Plavix. I told her that we do not HOLD those for procedures. She is aware to follow all of her instructions previously given.

## 2018-03-03 ENCOUNTER — Telehealth (HOSPITAL_COMMUNITY): Payer: Self-pay

## 2018-03-03 ENCOUNTER — Encounter (HOSPITAL_COMMUNITY)
Admission: RE | Disposition: A | Discharge status: Home / Self Care | Payer: Self-pay | Source: Ambulatory Visit | Attending: Internal Medicine

## 2018-03-03 ENCOUNTER — Other Ambulatory Visit: Payer: Self-pay

## 2018-03-03 ENCOUNTER — Encounter (HOSPITAL_COMMUNITY): Payer: Self-pay | Admitting: *Deleted

## 2018-03-03 ENCOUNTER — Ambulatory Visit (HOSPITAL_COMMUNITY)
Admission: RE | Admit: 2018-03-03 | Discharge: 2018-03-03 | Disposition: A | Discharge status: Home / Self Care | Payer: Medicare Other | Source: Ambulatory Visit | Attending: Internal Medicine | Admitting: Internal Medicine

## 2018-03-03 DIAGNOSIS — D122 Benign neoplasm of ascending colon: Secondary | ICD-10-CM | POA: Diagnosis not present

## 2018-03-03 DIAGNOSIS — Z811 Family history of alcohol abuse and dependence: Secondary | ICD-10-CM | POA: Insufficient documentation

## 2018-03-03 DIAGNOSIS — Z8612 Personal history of poliomyelitis: Secondary | ICD-10-CM | POA: Diagnosis not present

## 2018-03-03 DIAGNOSIS — I499 Cardiac arrhythmia, unspecified: Secondary | ICD-10-CM | POA: Insufficient documentation

## 2018-03-03 DIAGNOSIS — D125 Benign neoplasm of sigmoid colon: Secondary | ICD-10-CM | POA: Diagnosis not present

## 2018-03-03 DIAGNOSIS — K449 Diaphragmatic hernia without obstruction or gangrene: Secondary | ICD-10-CM | POA: Insufficient documentation

## 2018-03-03 DIAGNOSIS — Z7951 Long term (current) use of inhaled steroids: Secondary | ICD-10-CM | POA: Diagnosis not present

## 2018-03-03 DIAGNOSIS — Z79899 Other long term (current) drug therapy: Secondary | ICD-10-CM | POA: Diagnosis not present

## 2018-03-03 DIAGNOSIS — K5730 Diverticulosis of large intestine without perforation or abscess without bleeding: Secondary | ICD-10-CM

## 2018-03-03 DIAGNOSIS — Z7982 Long term (current) use of aspirin: Secondary | ICD-10-CM | POA: Diagnosis not present

## 2018-03-03 DIAGNOSIS — Z87442 Personal history of urinary calculi: Secondary | ICD-10-CM | POA: Diagnosis not present

## 2018-03-03 DIAGNOSIS — Z801 Family history of malignant neoplasm of trachea, bronchus and lung: Secondary | ICD-10-CM | POA: Insufficient documentation

## 2018-03-03 DIAGNOSIS — Z803 Family history of malignant neoplasm of breast: Secondary | ICD-10-CM | POA: Insufficient documentation

## 2018-03-03 DIAGNOSIS — Z86010 Personal history of colonic polyps: Secondary | ICD-10-CM

## 2018-03-03 DIAGNOSIS — K635 Polyp of colon: Secondary | ICD-10-CM | POA: Insufficient documentation

## 2018-03-03 DIAGNOSIS — Z8249 Family history of ischemic heart disease and other diseases of the circulatory system: Secondary | ICD-10-CM | POA: Insufficient documentation

## 2018-03-03 DIAGNOSIS — N181 Chronic kidney disease, stage 1: Secondary | ICD-10-CM | POA: Diagnosis not present

## 2018-03-03 DIAGNOSIS — E7800 Pure hypercholesterolemia, unspecified: Secondary | ICD-10-CM | POA: Insufficient documentation

## 2018-03-03 DIAGNOSIS — K219 Gastro-esophageal reflux disease without esophagitis: Secondary | ICD-10-CM | POA: Diagnosis not present

## 2018-03-03 DIAGNOSIS — I129 Hypertensive chronic kidney disease with stage 1 through stage 4 chronic kidney disease, or unspecified chronic kidney disease: Secondary | ICD-10-CM | POA: Diagnosis not present

## 2018-03-03 DIAGNOSIS — K649 Unspecified hemorrhoids: Secondary | ICD-10-CM

## 2018-03-03 DIAGNOSIS — Z87891 Personal history of nicotine dependence: Secondary | ICD-10-CM | POA: Insufficient documentation

## 2018-03-03 DIAGNOSIS — E785 Hyperlipidemia, unspecified: Secondary | ICD-10-CM | POA: Diagnosis not present

## 2018-03-03 DIAGNOSIS — Z1211 Encounter for screening for malignant neoplasm of colon: Secondary | ICD-10-CM | POA: Diagnosis not present

## 2018-03-03 DIAGNOSIS — Z809 Family history of malignant neoplasm, unspecified: Secondary | ICD-10-CM | POA: Insufficient documentation

## 2018-03-03 DIAGNOSIS — K625 Hemorrhage of anus and rectum: Secondary | ICD-10-CM

## 2018-03-03 DIAGNOSIS — M21371 Foot drop, right foot: Secondary | ICD-10-CM | POA: Insufficient documentation

## 2018-03-03 DIAGNOSIS — Z880 Allergy status to penicillin: Secondary | ICD-10-CM | POA: Diagnosis not present

## 2018-03-03 DIAGNOSIS — Z800 Family history of malignant neoplasm of digestive organs: Secondary | ICD-10-CM | POA: Insufficient documentation

## 2018-03-03 DIAGNOSIS — Z823 Family history of stroke: Secondary | ICD-10-CM | POA: Insufficient documentation

## 2018-03-03 DIAGNOSIS — Z8541 Personal history of malignant neoplasm of cervix uteri: Secondary | ICD-10-CM | POA: Diagnosis not present

## 2018-03-03 DIAGNOSIS — Z833 Family history of diabetes mellitus: Secondary | ICD-10-CM | POA: Insufficient documentation

## 2018-03-03 DIAGNOSIS — Z808 Family history of malignant neoplasm of other organs or systems: Secondary | ICD-10-CM | POA: Insufficient documentation

## 2018-03-03 DIAGNOSIS — Z9862 Peripheral vascular angioplasty status: Secondary | ICD-10-CM | POA: Diagnosis not present

## 2018-03-03 DIAGNOSIS — Z8673 Personal history of transient ischemic attack (TIA), and cerebral infarction without residual deficits: Secondary | ICD-10-CM | POA: Diagnosis not present

## 2018-03-03 DIAGNOSIS — D509 Iron deficiency anemia, unspecified: Secondary | ICD-10-CM | POA: Insufficient documentation

## 2018-03-03 DIAGNOSIS — Z825 Family history of asthma and other chronic lower respiratory diseases: Secondary | ICD-10-CM | POA: Insufficient documentation

## 2018-03-03 HISTORY — PX: POLYPECTOMY: SHX5525

## 2018-03-03 HISTORY — PX: COLONOSCOPY: SHX5424

## 2018-03-03 SURGERY — COLONOSCOPY
Anesthesia: Moderate Sedation

## 2018-03-03 MED ORDER — ONDANSETRON HCL 4 MG/2ML IJ SOLN
INTRAMUSCULAR | Status: AC
  Filled 2018-03-03: qty 2

## 2018-03-03 MED ORDER — MIDAZOLAM HCL 5 MG/5ML IJ SOLN
INTRAMUSCULAR | Status: DC | PRN
  Administered 2018-03-03: 2 mg via INTRAVENOUS
  Administered 2018-03-03: 1 mg via INTRAVENOUS

## 2018-03-03 MED ORDER — MEPERIDINE HCL 50 MG/ML IJ SOLN
INTRAMUSCULAR | Status: AC
  Filled 2018-03-03: qty 1

## 2018-03-03 MED ORDER — ONDANSETRON HCL 4 MG/2ML IJ SOLN
INTRAMUSCULAR | Status: DC | PRN
  Administered 2018-03-03: 4 mg via INTRAVENOUS

## 2018-03-03 MED ORDER — STERILE WATER FOR IRRIGATION IR SOLN
Status: DC | PRN
  Administered 2018-03-03: 14:00:00

## 2018-03-03 MED ORDER — MIDAZOLAM HCL 5 MG/5ML IJ SOLN
INTRAMUSCULAR | Status: AC
  Filled 2018-03-03: qty 10

## 2018-03-03 MED ORDER — MEPERIDINE HCL 100 MG/ML IJ SOLN
INTRAMUSCULAR | Status: DC | PRN
  Administered 2018-03-03: 25 mg

## 2018-03-03 MED ORDER — SODIUM CHLORIDE 0.9 % IV SOLN
INTRAVENOUS | Status: DC
  Administered 2018-03-03: 13:00:00 via INTRAVENOUS

## 2018-03-03 NOTE — Telephone Encounter (Signed)
Called to schedule f/u mri, no answer, left vm. AW 

## 2018-03-03 NOTE — Op Note (Addendum)
[facility] Patient Name: Paula Watson Procedure Date: 03/03/2018 12:03 PM MRN: [id number] Date of Birth: February 06, 1946 Attending MD: Norvel Richards , MD CSN: [id number] Age: 72 Admit Type: Outpatient Procedure:                Colonoscopy Indications:              High risk colon cancer surveillance: Personal                            history of colonic polyps Providers:                Norvel Richards, MD, Lurline Del, RN, Aram Candela Referring MD:              Medicines:                Midazolam 3 mg IV, Meperidine 25 mg IV Complications:            No immediate complications. Estimated Blood Loss:     Estimated blood loss was minimal. Procedure:                Pre-Anesthesia Assessment:                           - Prior to the procedure, a History and Physical                            was performed, and patient medications and                            allergies were reviewed. The patient's tolerance of                            previous anesthesia was also reviewed. The risks                            and benefits of the procedure and the sedation                            options and risks were discussed with the patient.                            All questions were answered, and informed consent                            was obtained. Prior Anticoagulants: The patient has                            taken no previous anticoagulant or antiplatelet                            agents. ASA Grade Assessment: II - A patient with  mild systemic disease. After reviewing the risks                            and benefits, the patient was deemed in                            satisfactory condition to undergo the procedure.                           After obtaining informed consent, the colonoscope                            was passed under direct vision. Throughout the                            procedure, the  patient's blood pressure, pulse, and                            oxygen saturations were monitored continuously. The                            CF-HQ190L ([id number]) scope was introduced through                            the anus and advanced to the the cecum, identified                            by appendiceal orifice and ileocecal valve. The                            colonoscopy was performed without difficulty. The                            ileocecal valve, appendiceal orifice, and rectum                            were photographed. Scope In: 1:39:21 PM Scope Out: 1:55:02 PM Scope Withdrawal Time: 0 hours 11 minutes 25 seconds  Total Procedure Duration: 0 hours 15 minutes 41 seconds  Findings:      Hemorrhoids were found on perianal exam.      The perianal and digital rectal examinations were normal.      A few small-mouthed diverticula were found in the sigmoid colon and       ascending colon.      Two sessile polyps were found in the sigmoid colon and ascending colon.       The polyps were 4 to 6 mm in size. These polyps were removed with a cold       snare. Resection and retrieval were complete. Estimated blood loss was       minimal.      The exam was otherwise without abnormality on direct and retroflexion       views. Impression:               - Hemorrhoids found on perianal exam.                           -  Diverticulosis in the sigmoid colon and in the                            ascending colon.                           - Two 4 to 6 mm polyps in the sigmoid colon and in                            the ascending colon, removed with a cold snare.                            Resected and retrieved.                           - The examination was otherwise normal on direct                            and retroflexion views. Moderate Sedation:      Moderate (conscious) sedation was administered by the endoscopy nurse       and supervised by the endoscopist. The following  parameters were       monitored: oxygen saturation, heart rate, blood pressure, and response       to care. Total physician intraservice time was 21 minutes. Recommendation:           - Patient has a contact number available for                            emergencies. The signs and symptoms of potential                            delayed complications were discussed with the                            patient. Return to normal activities tomorrow.                            Written discharge instructions were provided to the                            patient.                           - Advance diet as tolerated.                           - Continue present medications.                           - Repeat colonoscopy date to be determined after                            pending pathology results are reviewed for  surveillance.                           - Return to GI clinic (date not yet determined). Procedure Code(s):        --- Professional ---                           [phone], Colonoscopy, flexible; with removal of                            tumor(s), polyp(s), or other lesion(s) by snare                            technique                           G0500, Moderate sedation services provided by the                            same physician or other qualified health care                            professional performing a gastrointestinal                            endoscopic service that sedation supports,                            requiring the presence of an independent trained                            observer to assist in the monitoring of the                            patient's level of consciousness and physiological                            status; initial 15 minutes of intra-service time;                            patient age 17 years or older (additional time may                            be reported with [phone], as appropriate) Diagnosis  Code(s):        --- Professional ---                           Z86.010, Personal history of colonic polyps                           K64.9, Unspecified hemorrhoids                           D12.5, Benign neoplasm of sigmoid colon  D12.2, Benign neoplasm of ascending colon                           K57.30, Diverticulosis of large intestine without                            perforation or abscess without bleeding CPT copyright 2018 [employer]. All rights reserved. The codes documented in this report are preliminary and upon coder review may  be revised to meet current compliance requirements. Cristopher Estimable. Gerrell Tabet, MD Norvel Richards, MD 03/03/2018 2:11:04 PM This report has been signed electronically. Number of Addenda: 0

## 2018-03-03 NOTE — Discharge Instructions (Signed)
Colonoscopy Discharge Instructions  Read the instructions outlined below and refer to this sheet in the next few weeks. These discharge instructions provide you with general information on caring for yourself after you leave the hospital. Your doctor may also give you specific instructions. While your treatment has been planned according to the most current medical practices available, unavoidable complications occasionally occur. If you have any problems or questions after discharge, call Dr. Gala Romney at [phone]. ACTIVITY  You may resume your regular activity, but move at a slower pace for the next 24 hours.   Take frequent rest periods for the next 24 hours.   Walking will help get rid of the air and reduce the bloated feeling in your belly (abdomen).   No driving for 24 hours (because of the medicine (anesthesia) used during the test).    Do not sign any important legal documents or operate any machinery for 24 hours (because of the anesthesia used during the test).  NUTRITION  Drink plenty of fluids.   You may resume your normal diet as instructed by your doctor.   Begin with a light meal and progress to your normal diet. Heavy or fried foods are harder to digest and may make you feel sick to your stomach (nauseated).   Avoid alcoholic beverages for 24 hours or as instructed.  MEDICATIONS  You may resume your normal medications unless your doctor tells you otherwise.  WHAT YOU CAN EXPECT TODAY  Some feelings of bloating in the abdomen.   Passage of more gas than usual.   Spotting of blood in your stool or on the toilet paper.  IF YOU HAD POLYPS REMOVED DURING THE COLONOSCOPY:  No aspirin products for 7 days or as instructed.   No alcohol for 7 days or as instructed.   Eat a soft diet for the next 24 hours.  FINDING OUT THE RESULTS OF YOUR TEST Not all test results are available during your visit. If your test results are not back during the visit, make an appointment  with your caregiver to find out the results. Do not assume everything is normal if you have not heard from your caregiver or the medical facility. It is important for you to follow up on all of your test results.  SEEK IMMEDIATE MEDICAL ATTENTION IF:  You have more than a spotting of blood in your stool.   Your belly is swollen (abdominal distention).   You are nauseated or vomiting.   You have a temperature over 101.   You have abdominal pain or discomfort that is severe or gets worse throughout the day.     Colon polyp and diverticulosis information provided  Further recommendations to follow pending review of pathology report   Diverticulosis Diverticulosis is a condition that develops when small pouches (diverticula) form in the wall of the large intestine (colon). The colon is where water is absorbed and stool is formed. The pouches form when the inside layer of the colon pushes through weak spots in the outer layers of the colon. You may have a few pouches or many of them. What are the causes? The cause of this condition is not known. What increases the risk? The following factors may make you more likely to develop this condition:  Being older than age 53. Your risk for this condition increases with age. Diverticulosis is rare among people younger than age 20. By age 59, many people have it.  Eating a low-fiber diet.  Having frequent constipation.  Being  overweight.  Not getting enough exercise.  Smoking.  Taking over-the-counter pain medicines, like aspirin and ibuprofen.  Having a family history of diverticulosis.  What are the signs or symptoms? In most people, there are no symptoms of this condition. If you do have symptoms, they may include:  Bloating.  Cramps in the abdomen.  Constipation or diarrhea.  Pain in the lower left side of the abdomen.  How is this diagnosed? This condition is most often diagnosed during an exam for other colon problems.  Because diverticulosis usually has no symptoms, it often cannot be diagnosed independently. This condition may be diagnosed by:  Using a flexible scope to examine the colon (colonoscopy).  Taking an X-ray of the colon after dye has been put into the colon (barium enema).  Doing a CT scan.  How is this treated? You may not need treatment for this condition if you have never developed an infection related to diverticulosis. If you have had an infection before, treatment may include:  Eating a high-fiber diet. This may include eating more fruits, vegetables, and grains.  Taking a fiber supplement.  Taking a live bacteria supplement (probiotic).  Taking medicine to relax your colon.  Taking antibiotic medicines.  Follow these instructions at home:  Drink 6-8 glasses of water or more each day to prevent constipation.  Try not to strain when you have a bowel movement.  If you have had an infection before: ? Eat more fiber as directed by your health care provider or your diet and nutrition specialist (dietitian). ? Take a fiber supplement or probiotic, if your health care provider approves.  Take over-the-counter and prescription medicines only as told by your health care provider.  If you were prescribed an antibiotic, take it as told by your health care provider. Do not stop taking the antibiotic even if you start to feel better.  Keep all follow-up visits as told by your health care provider. This is important. Contact a health care provider if:  You have pain in your abdomen.  You have bloating.  You have cramps.  You have not had a bowel movement in 3 days. Get help right away if:  Your pain gets worse.  Your bloating becomes very bad.  You have a fever or chills, and your symptoms suddenly get worse.  You vomit.  You have bowel movements that are bloody or black.  You have bleeding from your rectum. Summary  Diverticulosis is a condition that develops when  small pouches (diverticula) form in the wall of the large intestine (colon).  You may have a few pouches or many of them.  This condition is most often diagnosed during an exam for other colon problems.  If you have had an infection related to diverticulosis, treatment may include increasing the fiber in your diet, taking supplements, or taking medicines. This information is not intended to replace advice given to you by your health care provider. Make sure you discuss any questions you have with your health care provider. Document Released: 12/27/2003 Document Revised: 02/18/2016 Document Reviewed: 02/18/2016 Elsevier Interactive Patient Education  2017 [place].  Colon Polyps Polyps are tissue growths inside the body. Polyps can grow in many places, including the large intestine (colon). A polyp may be a round bump or a mushroom-shaped growth. You could have one polyp or several. Most colon polyps are noncancerous (benign). However, some colon polyps can become cancerous over time. What are the causes? The exact cause of colon polyps is not  known. What increases the risk? This condition is more likely to develop in people who:  Have a family history of colon cancer or colon polyps.  Are older than 68 or older than 45 if they are African American.  Have inflammatory bowel disease, such as ulcerative colitis or Crohn disease.  Are overweight.  Smoke cigarettes.  Do not get enough exercise.  Drink too much alcohol.  Eat a diet that is: ? High in fat and red meat. ? Low in fiber.  Had childhood cancer that was treated with abdominal radiation.  What are the signs or symptoms? Most polyps do not cause symptoms. If you have symptoms, they may include:  Blood coming from your rectum when having a bowel movement.  Blood in your stool.The stool may look dark red or black.  A change in bowel habits, such as constipation or diarrhea.  How is this diagnosed? This  condition is diagnosed with a colonoscopy. This is a procedure that uses a lighted, flexible scope to look at the inside of your colon. How is this treated? Treatment for this condition involves removing any polyps that are found. Those polyps will then be tested for cancer. If cancer is found, your health care provider will talk to you about options for colon cancer treatment. Follow these instructions at home: Diet  Eat plenty of fiber, such as fruits, vegetables, and whole grains.  Eat foods that are high in calcium and vitamin D, such as milk, cheese, yogurt, eggs, liver, fish, and broccoli.  Limit foods high in fat, red meats, and processed meats, such as hot dogs, sausage, bacon, and lunch meats.  Maintain a healthy weight, or lose weight if recommended by your health care provider. General instructions  Do not smoke cigarettes.  Do not drink alcohol excessively.  Keep all follow-up visits as told by your health care provider. This is important. This includes keeping regularly scheduled colonoscopies. Talk to your health care provider about when you need a colonoscopy.  Exercise every day or as told by your health care provider. Contact a health care provider if:  You have new or worsening bleeding during a bowel movement.  You have new or increased blood in your stool.  You have a change in bowel habits.  You unexpectedly lose weight. This information is not intended to replace advice given to you by your health care provider. Make sure you discuss any questions you have with your health care provider. Document Released: 12/26/2003 Document Revised: 09/06/2015 Document Reviewed: 02/19/2015 Elsevier Interactive Patient Education  [employer].

## 2018-03-03 NOTE — H&P (Signed)
@LOGO @   Primary Care Physician:  Terald Sleeper, PA-C Primary Gastroenterologist:  Dr. Gala Romney Pre-Procedure History & Physical: HPI:  Paula Watson is a 72 y.o. female here for surveillance colonoscopy.  History of colonic adenomas removed in 2010.  No GI symptoms currently.  Past Medical History:  Diagnosis Date  . Anemia    Iron deficiency  . Aneurysm of artery ([place])   . Blood transfusion   . Bronchitis    "have had a couple of times in my lifetime"  . Cancer ([place]) 1985   cervical  . Cataract   . CKD (chronic kidney disease) stage 1, GFR 90 ml/min or greater was told by her PCP   Intolerant to ACE according to her PCP  . Dyspnea   . Dysrhythmia    :"SKIPS A BEAT "   SCHEDULED FOR STRESS TEST 2019  . GERD (gastroesophageal reflux disease)   . High cholesterol   . History of hiatal hernia   . History of kidney stones   . Hyperlipidemia   . Hypertension   . Poliomyelitis since 1948   has drop foot on R  . Stroke [facility]) 2007 affected R side  . Vertigo now improved    Past Surgical History:  Procedure Laterality Date  . CAROTID ENDARTERECTOMY Left 2000 L side  . CERVICAL CONIZATION W/BX  1985  . COLONOSCOPY  2010   Dr. Laural Golden: tubular adenoma.   . CORONARY ANGIOPLASTY  stented 2007  . DILATION AND CURETTAGE OF UTERUS  1980's  . foot reconstructjion  1953   right foot; post polio  . IR ANGIO EXTERNAL CAROTID SEL EXT CAROTID UNI R MOD SED  03/06/2017  . IR ANGIO INTRA EXTRACRAN SEL COM CAROTID INNOMINATE UNI R MOD SED  03/24/2017  . IR ANGIO VERTEBRAL SEL SUBCLAVIAN INNOMINATE BILAT MOD SED  03/09/2017  . IR ANGIO VERTEBRAL SEL VERTEBRAL BILAT MOD SED  03/06/2017  . IR RADIOLOGIST EVAL & MGMT  07/21/2016  . RADIOLOGY WITH ANESTHESIA N/A 03/24/2017   Procedure: STENTING;  Surgeon: Luanne Bras, MD;  Location: [facility];  Service: Radiology;  Laterality: N/A;  . TONSILLECTOMY  1954    Prior to Admission medications   Medication Sig Start Date End Date Taking?  Authorizing Provider  amLODipine (NORVASC) 10 MG tablet Take 1 tablet (10 mg total) by mouth daily. 10/14/17  Yes Terald Sleeper, PA-C  aspirin EC 325 MG EC tablet Take 1 tablet (325 mg total) by mouth daily. 03/09/17  Yes Rinehuls, Early Chars, PA-C  carvedilol (COREG) 12.5 MG tablet Take 1 tablet (12.5 mg total) by mouth 2 (two) times daily with a meal. 06/12/17  Yes Terald Sleeper, PA-C  clopidogrel (PLAVIX) 75 MG tablet Take 1 tablet (75 mg total) by mouth daily. 06/02/17  Yes Rosalin Hawking, MD  doxazosin (CARDURA) 2 MG tablet Take 2 mg by mouth every evening.  01/06/18  Yes [provider]  furosemide (LASIX) 20 MG tablet Take 1-2 tablets (20-40 mg total) by mouth daily. Patient taking differently: Take 20-40 mg by mouth See admin instructions. Take 1 tablet (20 mg) by mouth alternating with 2 tablets (40 mg) by mouth every other day cyclically. 01/07/18  Yes Terald Sleeper, PA-C  hydrocortisone (ANUSOL-HC) 2.5 % rectal cream Place 1 application rectally 2 (two) times daily. Patient taking differently: Place 1 application rectally 2 (two) times daily as needed for hemorrhoids.  06/09/16  Yes Terald Sleeper, PA-C  pantoprazole (PROTONIX) 20 MG tablet Take 1  tablet (20 mg total) by mouth daily. Patient taking differently: Take 20 mg by mouth daily before breakfast.  10/14/17  Yes Terald Sleeper, PA-C  polyethylene glycol-electrolytes (TRILYTE) 420 g solution Take 4,000 mLs by mouth as directed. 01/15/18  Yes Torri Michalski, Cristopher Estimable, MD  pravastatin (PRAVACHOL) 20 MG tablet Take 1 tablet (20 mg total) by mouth daily. Patient taking differently: Take 20 mg by mouth daily at 6 PM.  03/08/17  Yes Rinehuls, Early Chars, PA-C  RAYALDEE 30 MCG CPCR Take 30 mcg by mouth at bedtime.   Yes [provider]  VESICARE 5 MG tablet Take 5 mg by mouth at bedtime. 09/01/17  Yes [provider]  albuterol (PROVENTIL HFA;VENTOLIN HFA) 108 (90 Base) MCG/ACT inhaler Inhale 2 puffs into the lungs every 6 (six) hours as  needed for wheezing or shortness of breath. 06/12/17   Terald Sleeper, PA-C  clotrimazole-betamethasone (LOTRISONE) lotion Apply topically 2 (two) times daily. Patient taking differently: Apply 1 application topically 2 (two) times daily as needed (for skin irritation/rash).  06/12/17   Terald Sleeper, PA-C  linaclotide [facility]) 145 MCG CAPS capsule Take 1 capsule (145 mcg total) by mouth daily before breakfast. Patient taking differently: Take 145 mcg by mouth daily as needed (for constipation.).  06/12/17   Terald Sleeper, PA-C  mupirocin cream (BACTROBAN) 2 % Apply 1 application topically 2 (two) times daily. Patient taking differently: Apply 1 application topically 2 (two) times daily as needed (irritation.).  06/09/16   Terald Sleeper, PA-C    Allergies as of 01/15/2018 - Review Complete 01/15/2018  Allergen Reaction Noted  . Penicillins Hives and Swelling 02/19/2011    Family History  Problem Relation Age of Onset  . Coronary artery disease Mother   . Heart disease Mother   . Stroke Mother   . Diabetes type II Father   . Cancer Father        brain stem  . Brain cancer Father   . Diabetes Father   . Cancer Sister        breast with recurrance  . Diabetes Sister   . Stroke Sister   . Breast cancer Sister   . Coronary artery disease Brother   . Healthy Daughter   . COPD Maternal Grandmother        breast  . Breast cancer Maternal Grandmother   . COPD Paternal Grandmother        breast  . Breast cancer Paternal Grandmother   . Obesity Sister   . Heart disease Brother   . Heart attack Brother   . Lung cancer Brother   . COPD Other   . Breast cancer Other   . Stroke Paternal Grandfather   . Drug abuse Son   . Alcohol abuse Son   . Colon cancer Other        passed from colon cancer    Social History   Socioeconomic History  . Marital status: Divorced    Spouse name: Not on file  . Number of children: 2  . Years of education: 74  . Highest education level: 12th grade   Occupational History  . 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 Financial resource strain: Somewhat hard  . Food insecurity:    Worry: Never true    Inability: Never true  . Transportation needs:    Medical: No    Non-medical: No  Tobacco Use  .  Smoking status: Former Smoker    Packs/day: 0.25    Years: 50.00    Pack years: 12.50    Types: Cigarettes    Last attempt to quit: 03/03/2017    Years since quitting: 1.0  . Smokeless tobacco: Never Used  Substance and Sexual Activity  . Alcohol use: No  . Drug use: No  . Sexual activity: Not Currently    Birth control/protection: Post-menopausal  Lifestyle  . Physical activity:    Days per week: 0 days    Minutes per session: 0 min  . Stress: Rather much  Relationships  . Social connections:    Talks on phone: More than three times a week    Gets together: More than three times a week    Attends religious service: Never    Active member of club or organization: No    Attends meetings of clubs or organizations: Never    Relationship status: Divorced  . Intimate partner violence:    Fear of current or ex partner: No    Emotionally abused: No    Physically abused: No    Forced sexual activity: No  Other Topics Concern  . Not on file  Social History Narrative  . Not on file    Review of Systems: See HPI, otherwise negative ROS  Physical Exam: BP (!) 171/64   Pulse 62   Temp 97.8 F (36.6 C) (Oral)   Resp 17   Ht 5\' 2"  (1.575 m)   Wt 76.7 kg   SpO2 99%   BMI 30.93 kg/m  General:   Alert,  Well-developed, well-nourished, pleasant and cooperative in NAD  Neck:  Supple; no masses or thyromegaly. No significant cervical adenopathy. Lungs:  Clear throughout to auscultation.   No wheezes, crackles, or rhonchi. No acute distress. Heart:  Regular rate and rhythm; no murmurs, clicks, rubs,  or gallops. Abdomen: Non-distended, normal bowel sounds.  Soft and  nontender without appreciable mass or hepatosplenomegaly.  Pulses:  Normal pulses noted. Extremities:  Without clubbing or edema.  Impression/Plan: 72 year old lady with history of colonic adenomas.  Here for surveillance colonoscopy. The risks, benefits, limitations, alternatives and imponderables have been reviewed with the patient. Questions have been answered. All parties are agreeable.     Notice: This dictation was prepared with Dragon dictation along with smaller phrase technology. Any transcriptional errors that result from this process are unintentional and may not be corrected upon review.

## 2018-03-05 ENCOUNTER — Encounter: Payer: Self-pay | Admitting: Internal Medicine

## 2018-03-08 ENCOUNTER — Encounter (HOSPITAL_COMMUNITY): Payer: Self-pay | Admitting: Internal Medicine

## 2018-03-09 IMAGING — XA IR CARO CERE HEAD/NECK UNILAT RIGHT (MS)
1 series · 12 of 24 positions shown · IV contrast (IODINE)
Comparison: Diagnostic catheter arteriogram of 03/06/2017.

CLINICAL DATA: Transient left-sided weakness with slurred speech.
Occluded left subclavian to left common carotid artery bypass graft.

EXAM:
IR ANGIO INTRA EXTRACRAN SEL COM CAROTID INNOMINATE UNI RIGHT MOD
SED
TECHNIQUE: Informed written consent was obtained from the patient after a
thorough discussion of the procedural risks, benefits and
alternatives. All questions were addressed. Maximal Sterile Barrier
Technique was utilized including caps, mask, sterile gowns, sterile
gloves, sterile drape, hand hygiene and skin antiseptic. A timeout
was performed prior to the initiation of the procedure.

[Series 300: dr. (person_name) · 12 of 86 slices shown]
[im 4/86]
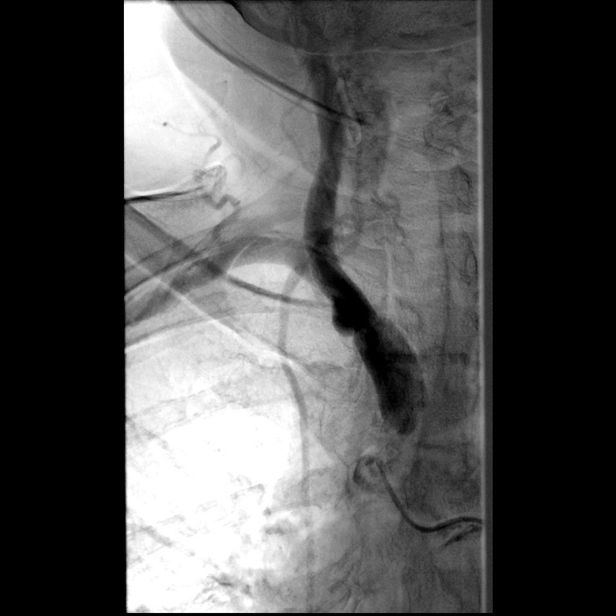
[im 12/86]
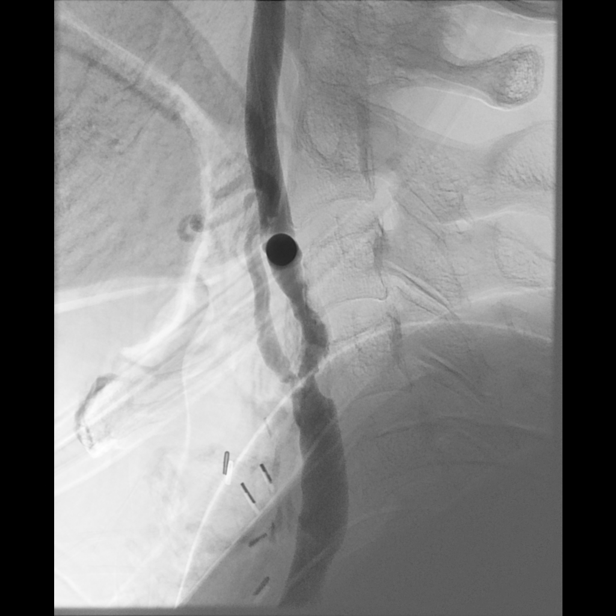
[im 19/86]
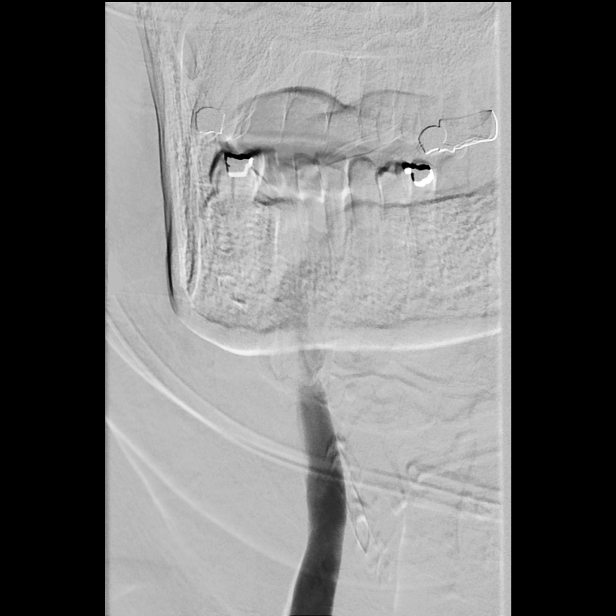
[im 26/86]
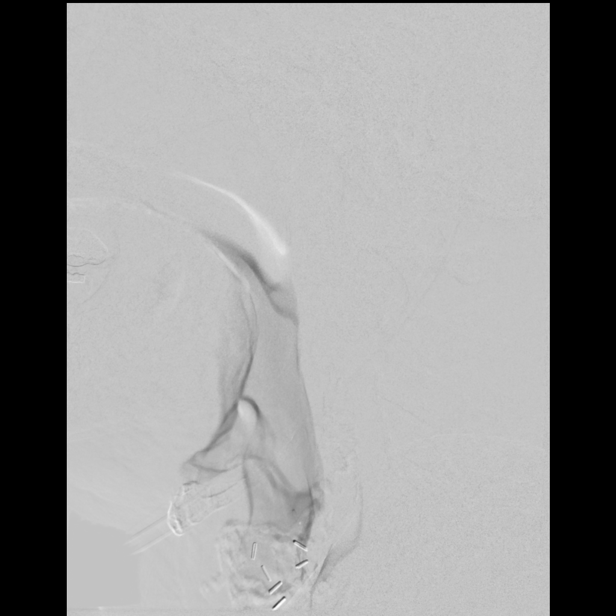
[im 34/86]
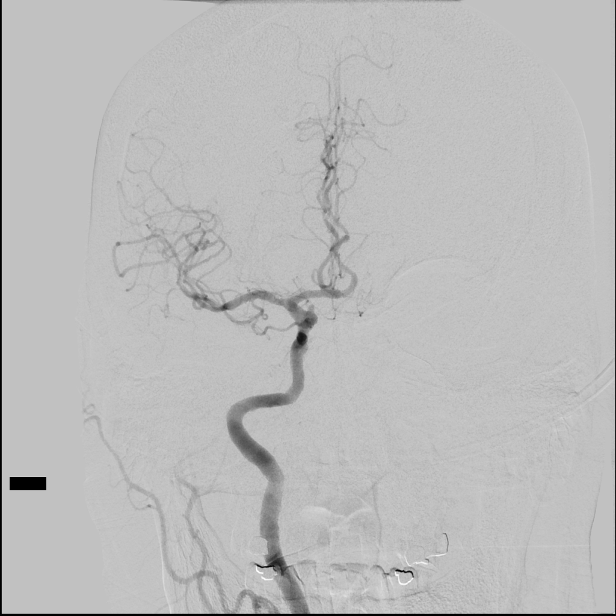
[im 41/86]
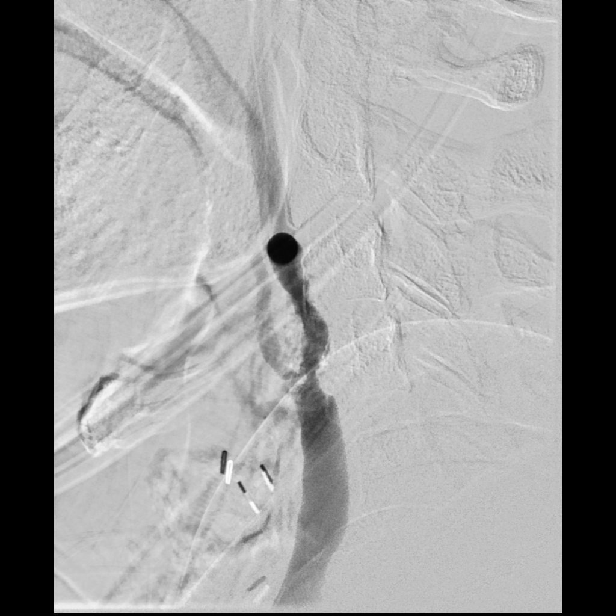
[im 49/86]
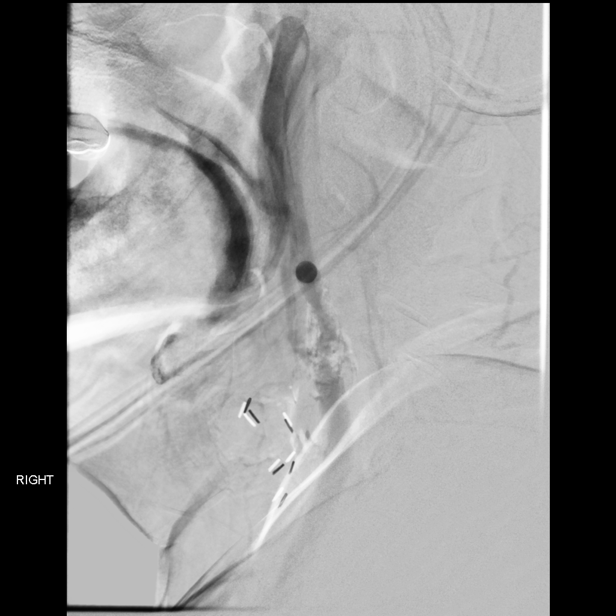
[im 56/86]
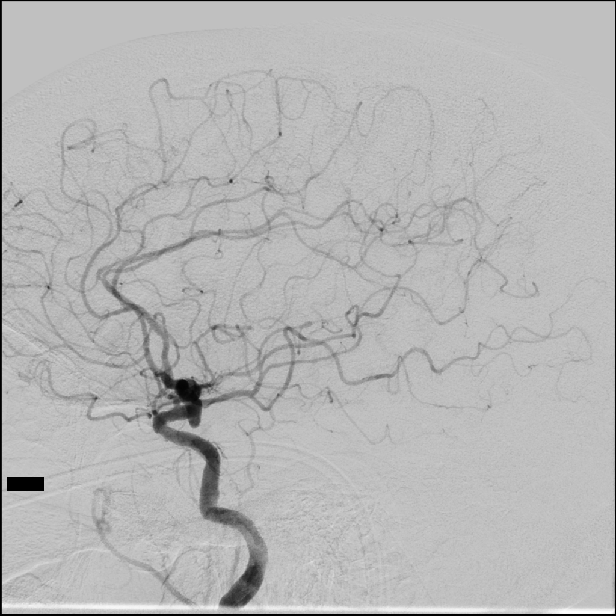
[im 63/86]
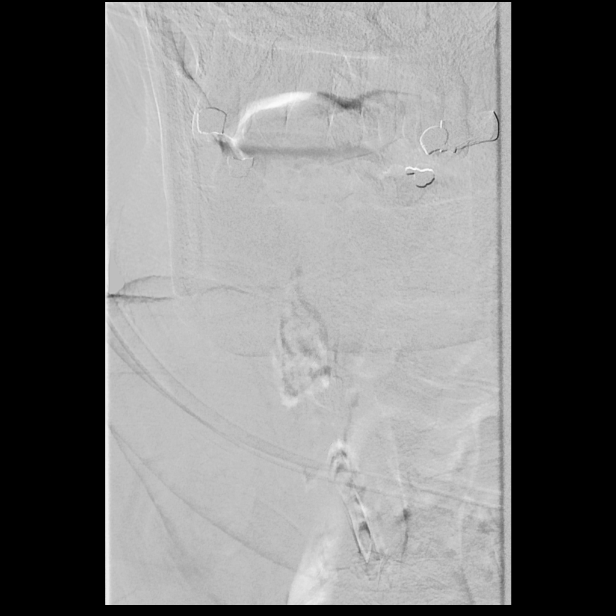
[im 71/86]
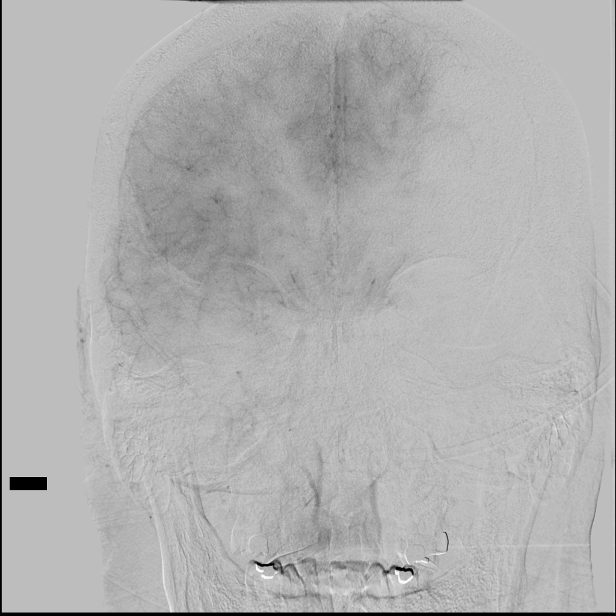
[im 78/86]
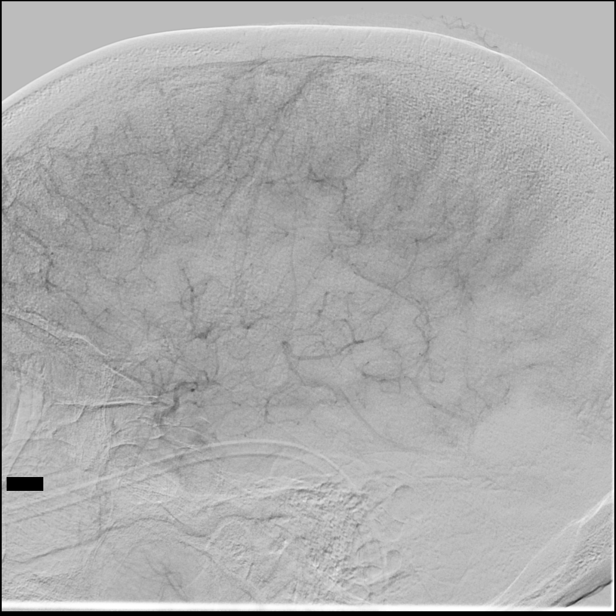
[im 86/86]
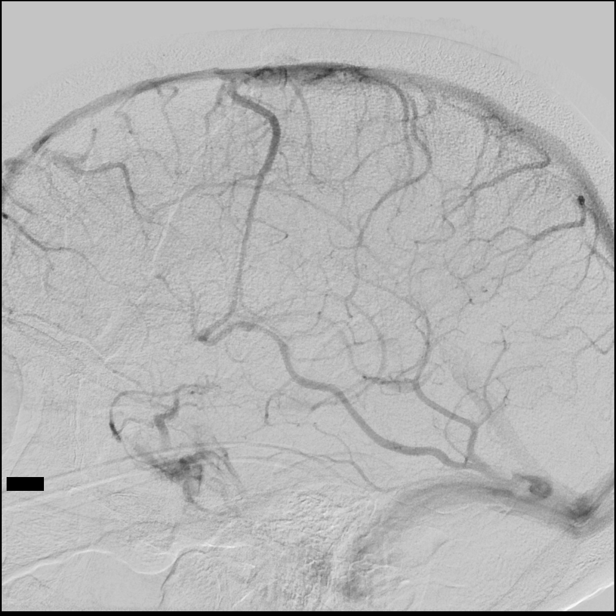

[12 of 24 positions shown; findings below may reference images not displayed]

MEDICATIONS:
Heparin 7888 units IV; no antibiotic was administered within 1 hour
of the procedure.

ANESTHESIA/SEDATION:
Mac anesthesia as per the [REDACTED] at [REDACTED].

CONTRAST:  Isovue 300 40 cc.

FLUOROSCOPY TIME:  Fluoroscopy Time: 30 minutes 40 seconds (239
mGy).

COMPLICATIONS:
None immediate.
The left groin was prepped and draped in the usual sterile fashion.
Thereafter using modified Seldinger technique, transfemoral access
into the right common femoral artery was obtained without
difficulty. Over a 0.035 inch guidewire, a 5 French Pinnacle sheath
was inserted. Through this, and also over 0.035 inch guidewire, a 5
French JB 1 catheter was advanced to the aortic arch region and
selectively positioned in the right common carotid artery, and the
innominate artery.
FINDINGS: The innominate artery injection demonstrates approximately 50%
stenosis at the origin of the innominate artery secondary to a
calcified atherosclerotic plaque which extends into the entirety of
the innominate artery and into the right subclavian artery.

Wide patency of the right subclavian artery is noted. Hypoplastic
right vertebral artery is seen with slow ascent of the vessel to the
level of the suboccipital region on the lateral DSA.

The right common carotid arteriogram demonstrates a severe pre
occlusive stenosis of the origin of the right external carotid
artery associated with a soft plaque along the posterior wall.
Patency of the right external carotid artery branches is noted.

The right internal carotid artery at the bulb and just distal to the
bulb demonstrates severe circumferential stenosis secondary to a
complex soft and partially calcified plaque. The resultant stenosis
is approximately 80-85% by the NASCET criteria.

A discrete soft plaque is noted extending into the right internal
carotid artery just distal to the bulb to the point of the junction
of the proximal [DATE] and the middle [DATE]. There is a sharp kink of the
junction of the proximal [DATE] and the middle [DATE] associated with
approximately 60% stenosis.

Distal to this, the vessel is seen to opacify to the cranial skull
base.

The petrous, the cavernous and the supraclinoid segments are widely
patent.

Again demonstrated is an approximately 2.5-3 mm saccular outpouching
in the posterior communicating artery region most likely
representing an intracranial aneurysm.

The right middle cerebral artery and the right anterior cerebral
artery opacify into the capillary and venous phases.

There is approximately 50% stenosis of the right middle cerebral
artery M1 segment.

Focal areas of mild-to-moderate stenosis are noted of the right MCA
trifurcation branches.

However, free flow of contrast into the capillary and venous phase
is noted.

The right anterior cerebral artery opacifies normally into the
capillary and venous phases. Cross-filling with the anterior
communicating artery of the left anterior cerebral artery A2 segment
and distally is noted.

At the end of the procedure, the patient's right groin pulses, and
the distal pulses remained Dopplerable in both dorsalis pedis and
posterior tibial regions. The left posterior tibial was palpable 1+.
These remained unchanged from the start of the procedure.
IMPRESSION: Approximately 85% stenosis of the right internal carotid artery at
the bulb and just distally secondary to a complex irregular
soft/calcified plaque.

60% stenosis associated with a kink at the junction of the middle
and the proximal [DATE] of the right internal carotid artery.

Approximately 50% stenosis of the right middle cerebral artery M1
segment with mild to moderate arteriosclerotic changes involving the
right MCA trifurcation branches.

PLAN:
MRI MRA of the brain in approximately 4 months time.

## 2018-04-01 ENCOUNTER — Ambulatory Visit: Payer: Medicare Other | Admitting: Adult Health

## 2018-04-12 ENCOUNTER — Other Ambulatory Visit: Payer: Self-pay | Admitting: Physician Assistant

## 2018-04-12 ENCOUNTER — Ambulatory Visit: Payer: Medicare Other | Admitting: Physician Assistant

## 2018-04-19 ENCOUNTER — Ambulatory Visit: Payer: Medicare Other | Admitting: Physician Assistant

## 2018-04-23 ENCOUNTER — Other Ambulatory Visit: Payer: Medicare Other

## 2018-04-23 DIAGNOSIS — Z79899 Other long term (current) drug therapy: Secondary | ICD-10-CM | POA: Diagnosis not present

## 2018-04-23 DIAGNOSIS — I10 Essential (primary) hypertension: Secondary | ICD-10-CM | POA: Diagnosis not present

## 2018-04-23 DIAGNOSIS — E559 Vitamin D deficiency, unspecified: Secondary | ICD-10-CM | POA: Diagnosis not present

## 2018-04-23 DIAGNOSIS — N183 Chronic kidney disease, stage 3 (moderate): Secondary | ICD-10-CM | POA: Diagnosis not present

## 2018-04-23 DIAGNOSIS — D509 Iron deficiency anemia, unspecified: Secondary | ICD-10-CM | POA: Diagnosis not present

## 2018-04-23 DIAGNOSIS — R809 Proteinuria, unspecified: Secondary | ICD-10-CM | POA: Diagnosis not present

## 2018-04-27 DIAGNOSIS — I10 Essential (primary) hypertension: Secondary | ICD-10-CM | POA: Diagnosis not present

## 2018-04-27 DIAGNOSIS — N049 Nephrotic syndrome with unspecified morphologic changes: Secondary | ICD-10-CM | POA: Diagnosis not present

## 2018-04-27 DIAGNOSIS — E1121 Type 2 diabetes mellitus with diabetic nephropathy: Secondary | ICD-10-CM | POA: Diagnosis not present

## 2018-04-27 DIAGNOSIS — N184 Chronic kidney disease, stage 4 (severe): Secondary | ICD-10-CM | POA: Diagnosis not present

## 2018-05-05 ENCOUNTER — Telehealth (HOSPITAL_COMMUNITY): Payer: Self-pay

## 2018-05-05 NOTE — Telephone Encounter (Signed)
Called to schedule f/u mri, no answer, left vm. AW 

## 2018-06-01 ENCOUNTER — Other Ambulatory Visit: Payer: Self-pay | Admitting: Neurology

## 2018-06-02 ENCOUNTER — Other Ambulatory Visit: Payer: Self-pay

## 2018-06-02 MED ORDER — CLOPIDOGREL BISULFATE 75 MG PO TABS: 75.0000 mg | ORAL_TABLET | Freq: Every day | ORAL | 0 refills | Status: DC

## 2018-06-09 ENCOUNTER — Telehealth (HOSPITAL_COMMUNITY): Payer: Self-pay | Admitting: *Deleted

## 2018-06-09 NOTE — Telephone Encounter (Signed)
Left VM to remind of appt

## 2018-06-10 ENCOUNTER — Ambulatory Visit: Payer: Medicare Other | Admitting: Family

## 2018-06-10 ENCOUNTER — Encounter (HOSPITAL_COMMUNITY): Payer: Medicare Other

## 2018-07-07 ENCOUNTER — Ambulatory Visit: Payer: Medicare Other | Admitting: Physician Assistant

## 2018-07-21 ENCOUNTER — Other Ambulatory Visit: Payer: Self-pay | Admitting: Physician Assistant

## 2018-07-26 ENCOUNTER — Other Ambulatory Visit: Payer: Self-pay

## 2018-07-26 MED ORDER — PRAVASTATIN SODIUM 20 MG PO TABS: 20.0000 mg | ORAL_TABLET | Freq: Every day | ORAL | 11 refills | Status: AC

## 2018-07-26 NOTE — Telephone Encounter (Signed)
Last lipid 10/14/17

## 2018-07-27 ENCOUNTER — Other Ambulatory Visit: Payer: Self-pay | Admitting: Physician Assistant

## 2018-07-28 ENCOUNTER — Ambulatory Visit (INDEPENDENT_AMBULATORY_CARE_PROVIDER_SITE_OTHER): Payer: Medicare Other | Admitting: Physician Assistant

## 2018-07-28 ENCOUNTER — Other Ambulatory Visit: Payer: Self-pay

## 2018-07-28 DIAGNOSIS — E1129 Type 2 diabetes mellitus with other diabetic kidney complication: Secondary | ICD-10-CM

## 2018-07-28 DIAGNOSIS — R809 Proteinuria, unspecified: Secondary | ICD-10-CM | POA: Diagnosis not present

## 2018-07-28 DIAGNOSIS — K640 First degree hemorrhoids: Secondary | ICD-10-CM

## 2018-07-28 DIAGNOSIS — I10 Essential (primary) hypertension: Secondary | ICD-10-CM

## 2018-07-28 DIAGNOSIS — J3089 Other allergic rhinitis: Secondary | ICD-10-CM

## 2018-07-28 MED ORDER — PANTOPRAZOLE SODIUM 20 MG PO TBEC: 20.0000 mg | DELAYED_RELEASE_TABLET | Freq: Every day | ORAL | 11 refills | Status: DC

## 2018-07-28 MED ORDER — HYDROCORTISONE 2.5 % RE CREA: 1.0000 "application " | TOPICAL_CREAM | Freq: Two times a day (BID) | RECTAL | 5 refills | Status: DC | PRN

## 2018-07-28 MED ORDER — PANTOPRAZOLE SODIUM 20 MG PO TBEC: 20.0000 mg | DELAYED_RELEASE_TABLET | Freq: Every day | ORAL | 3 refills | Status: AC

## 2018-07-28 MED ORDER — CLOTRIMAZOLE-BETAMETHASONE 1-0.05 % EX LOTN: TOPICAL_LOTION | Freq: Two times a day (BID) | CUTANEOUS | 5 refills | Status: AC

## 2018-08-02 ENCOUNTER — Encounter: Payer: Self-pay | Admitting: Physician Assistant

## 2018-08-02 NOTE — Progress Notes (Signed)
Telephone visit  Subjective: CC: Hypertension, allergic rhinitis, type 2 diabetes controlled, kidney disease, hemorrhoids PCP: Terald Sleeper, PA-C AGT:XMIWO LATRONDA SPINK is a 73 y.o. female calls for telephone consult today. Patient provides verbal consent for consult held via phone.  Patient is identified with 2 separate identifiers.  At this time the entire area is on COVID-19 social distancing and stay home orders are in place.  Patient is of higher risk and therefore we are performing this by a virtual method.  Location of patient: Home Location of provider: [facility] Others present for call: No  This patient is having a recheck on her chronic medical conditions.  They include hypertension, allergic rhinitis, type 2 diabetes with kidney disease, and hemorrhoids related to chronic constipation.  She has been trying to treat the hemorrhoids and they do seem to be a little bit better now.  She has been having good blood pressure readings and good sugar readings.  She has been seen her nephrologist.  She is due to go back in a few weeks.  She does need labs.  She had a colonoscopy in November.   ROS: Per HPI  Allergies  Allergen Reactions  . Penicillins Hives and Swelling    PATIENT HAS HAD A PCN REACTION WITH IMMEDIATE RASH, FACIAL/TONGUE/THROAT SWELLING, SOB, OR LIGHTHEADEDNESS WITH HYPOTENSION:  #  #  #  YES  #  #  #   Has patient had a PCN reaction causing severe rash involving mucus membranes or skin necrosis: Unknown Has patient had a PCN reaction that required hospitalization: No Has patient had a PCN reaction occurring within the last 10 years: No    Past Medical History:  Diagnosis Date  . Anemia    Iron deficiency  . Aneurysm of artery ([place])   . Blood transfusion   . Bronchitis    "have had a couple of times in my lifetime"  . Cancer ([place]) 1985   cervical  . Cataract   . CKD (chronic kidney disease) stage 1, GFR 90 ml/min or greater was told by her PCP   Intolerant to  ACE according to her PCP  . Dyspnea   . Dysrhythmia    :"SKIPS A BEAT "   SCHEDULED FOR STRESS TEST 2019  . GERD (gastroesophageal reflux disease)   . High cholesterol   . History of hiatal hernia   . History of kidney stones   . Hyperlipidemia   . Hypertension   . Poliomyelitis since 1948   has drop foot on R  . Stroke [facility]) 2007 affected R side  . Vertigo now improved    Current Outpatient Medications:  .  albuterol (PROVENTIL HFA;VENTOLIN HFA) 108 (90 Base) MCG/ACT inhaler, Inhale 2 puffs into the lungs every 6 (six) hours as needed for wheezing or shortness of breath., Disp: 1 Inhaler, Rfl: 6 .  amLODipine (NORVASC) 10 MG tablet, Take 1 tablet (10 mg total) by mouth daily., Disp: 30 tablet, Rfl: 5 .  aspirin EC 325 MG EC tablet, Take 1 tablet (325 mg total) by mouth daily., Disp: 30 tablet, Rfl: 0 .  carvedilol (COREG) 12.5 MG tablet, Take 1 tablet (12.5 mg total) by mouth 2 (two) times daily with a meal., Disp: 180 tablet, Rfl: 3 .  clopidogrel (PLAVIX) 75 MG tablet, Take 1 tablet (75 mg total) by mouth daily., Disp: 90 tablet, Rfl: 0 .  clotrimazole-betamethasone (LOTRISONE) lotion, Apply topically 2 (two) times daily., Disp: 30 mL, Rfl: 5 .  doxazosin (CARDURA)  2 MG tablet, Take 2 mg by mouth every evening. , Disp: , Rfl: 1 .  furosemide (LASIX) 20 MG tablet, Take 1-2 tablets (20-40 mg total) by mouth daily. (Patient taking differently: Take 20-40 mg by mouth See admin instructions. Take 1 tablet (20 mg) by mouth alternating with 2 tablets (40 mg) by mouth every other day cyclically.), Disp: 086 tablet, Rfl: 3 .  hydrocortisone (ANUSOL-HC) 2.5 % rectal cream, Place 1 application rectally 2 (two) times daily as needed for hemorrhoids., Disp: 28 g, Rfl: 5 .  linaclotide (LINZESS) 145 MCG CAPS capsule, Take 1 capsule (145 mcg total) by mouth daily before breakfast. (Patient taking differently: Take 145 mcg by mouth daily as needed (for constipation.). ), Disp: 30 capsule, Rfl: 11 .   mupirocin cream (BACTROBAN) 2 %, Apply 1 application topically 2 (two) times daily. (Patient taking differently: Apply 1 application topically 2 (two) times daily as needed (irritation.). ), Disp: 15 g, Rfl: 0 .  pantoprazole (PROTONIX) 20 MG tablet, Take 1 tablet (20 mg total) by mouth daily. (Needs to be seen before next refill), Disp: 90 tablet, Rfl: 3 .  polyethylene glycol-electrolytes (TRILYTE) 420 g solution, Take 4,000 mLs by mouth as directed., Disp: 4000 mL, Rfl: 0 .  pravastatin (PRAVACHOL) 20 MG tablet, Take 1 tablet (20 mg total) by mouth daily., Disp: 30 tablet, Rfl: 11 .  RAYALDEE 30 MCG CPCR, Take 30 mcg by mouth at bedtime., Disp: , Rfl:   Assessment/ Plan: 73 y.o. female   1. Grade I hemorrhoids - hydrocortisone (ANUSOL-HC) 2.5 % rectal cream; Place 1 application rectally 2 (two) times daily as needed for hemorrhoids.  Dispense: 28 g; Refill: 5  2. HTN (hypertension), benign Continue home medications  3. Chronic nonseasonal allergic rhinitis due to pollen Continue medications  4. Controlled type 2 diabetes mellitus with microalbuminuria, without long-term current use of insulin (HCC) Continue medications   Start time: 3:49 PM End time: 4:10 PM  Meds ordered this encounter  Medications  . clotrimazole-betamethasone (LOTRISONE) lotion    Sig: Apply topically 2 (two) times daily.    Dispense:  30 mL    Refill:  5    Order Specific Question:   Supervising Provider    Answer:   Janora Norlander [[id number]]  . DISCONTD: pantoprazole (PROTONIX) 20 MG tablet    Sig: Take 1 tablet (20 mg total) by mouth daily. (Needs to be seen before next refill)    Dispense:  30 tablet    Refill:  11    Order Specific Question:   Supervising Provider    Answer:   Janora Norlander [[id number]]  . pantoprazole (PROTONIX) 20 MG tablet    Sig: Take 1 tablet (20 mg total) by mouth daily. (Needs to be seen before next refill)    Dispense:  90 tablet    Refill:  3    Order Specific  Question:   Supervising Provider    Answer:   Janora Norlander [[id number]]  . hydrocortisone (ANUSOL-HC) 2.5 % rectal cream    Sig: Place 1 application rectally 2 (two) times daily as needed for hemorrhoids.    Dispense:  28 g    Refill:  5    Order Specific Question:   Supervising Provider    Answer:   Janora Norlander [[id number]]    Particia Nearing PA-C [place] [phone]

## 2018-08-23 ENCOUNTER — Other Ambulatory Visit: Payer: Self-pay | Admitting: Physician Assistant

## 2018-09-07 ENCOUNTER — Other Ambulatory Visit: Payer: Self-pay | Admitting: Adult Health

## 2018-09-07 ENCOUNTER — Other Ambulatory Visit: Payer: Self-pay | Admitting: Physician Assistant

## 2018-09-07 DIAGNOSIS — I10 Essential (primary) hypertension: Secondary | ICD-10-CM

## 2018-09-08 ENCOUNTER — Other Ambulatory Visit: Payer: Self-pay | Admitting: Physician Assistant

## 2018-09-08 MED ORDER — CLOPIDOGREL BISULFATE 75 MG PO TABS: 75.0000 mg | ORAL_TABLET | Freq: Every day | ORAL | 3 refills | Status: AC

## 2018-10-11 ENCOUNTER — Telehealth (HOSPITAL_COMMUNITY): Payer: Self-pay

## 2018-10-11 NOTE — Telephone Encounter (Signed)
Called to schedule f/u mri, no answer, vm full. AW

## 2018-11-04 IMAGING — MG DIGITAL SCREENING BILATERAL MAMMOGRAM WITH TOMO AND CAD
6 series · 6 of 18 positions shown · non-contrast
Comparison: Previous exam(s).

CLINICAL DATA: Screening.

EXAM:
DIGITAL SCREENING BILATERAL MAMMOGRAM WITH TOMO AND CAD

[R CC synth-2D]
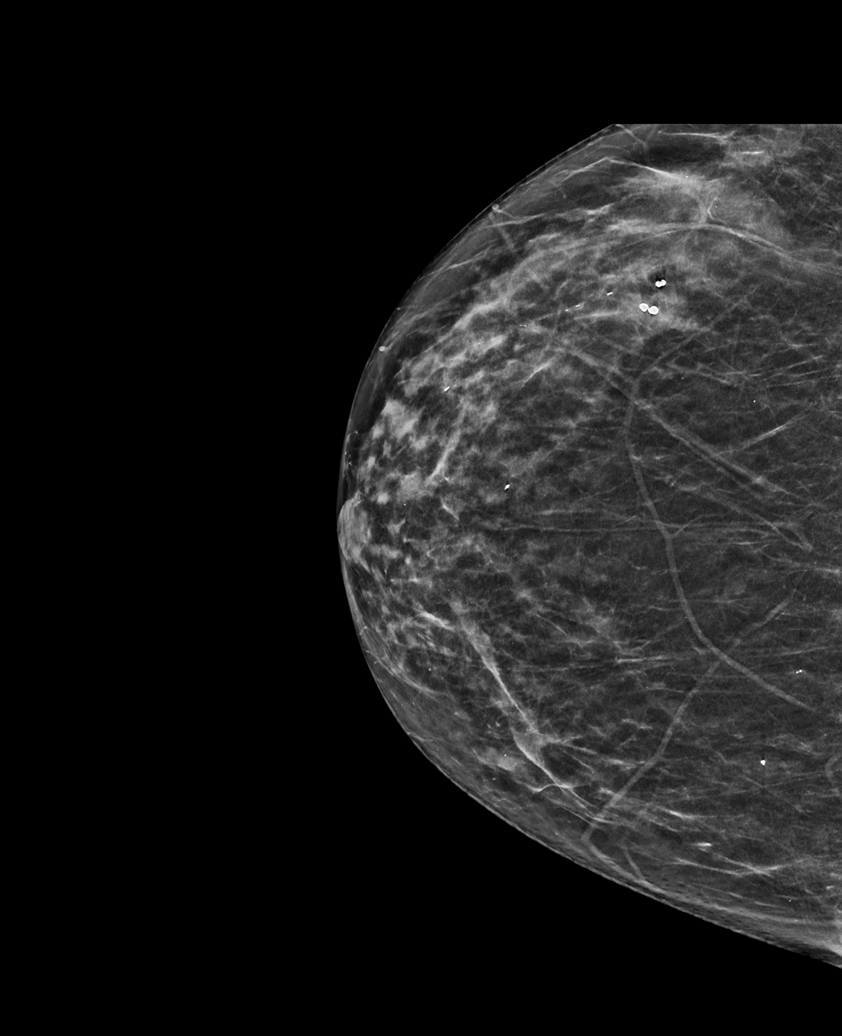

[L CC synth-2D]
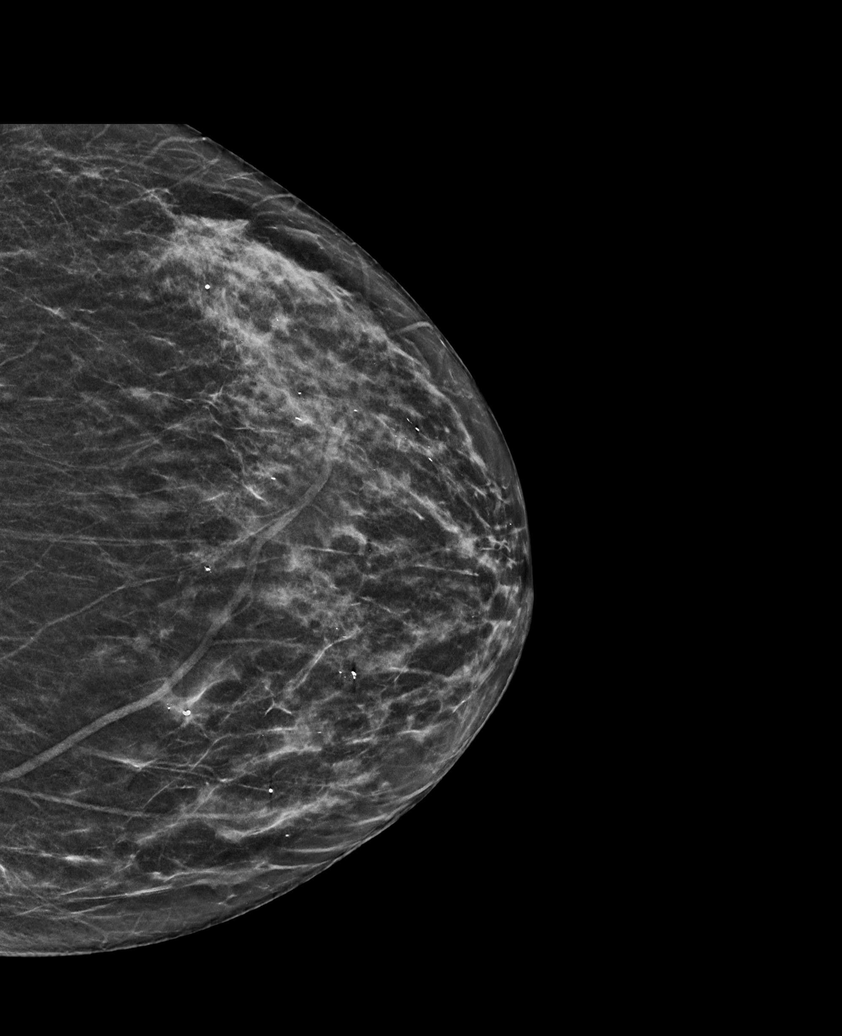

[R MLO synth-2D]
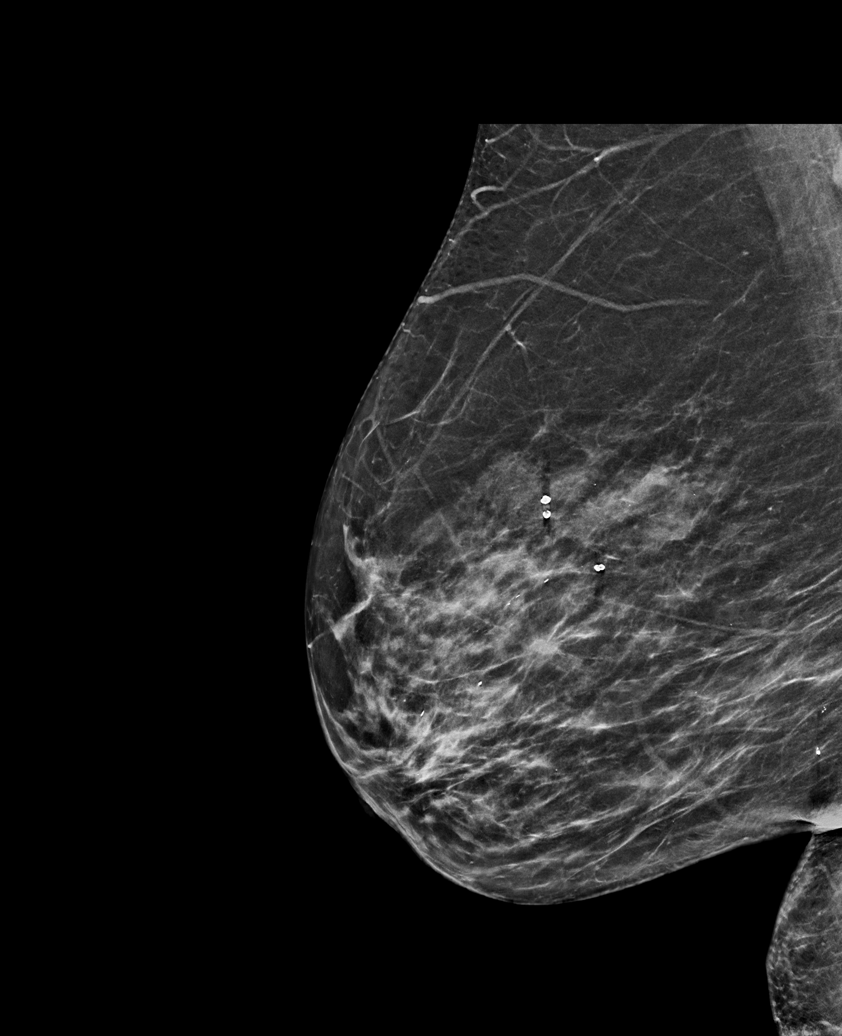

[L CC tomo · tomo slice 29/57.0]
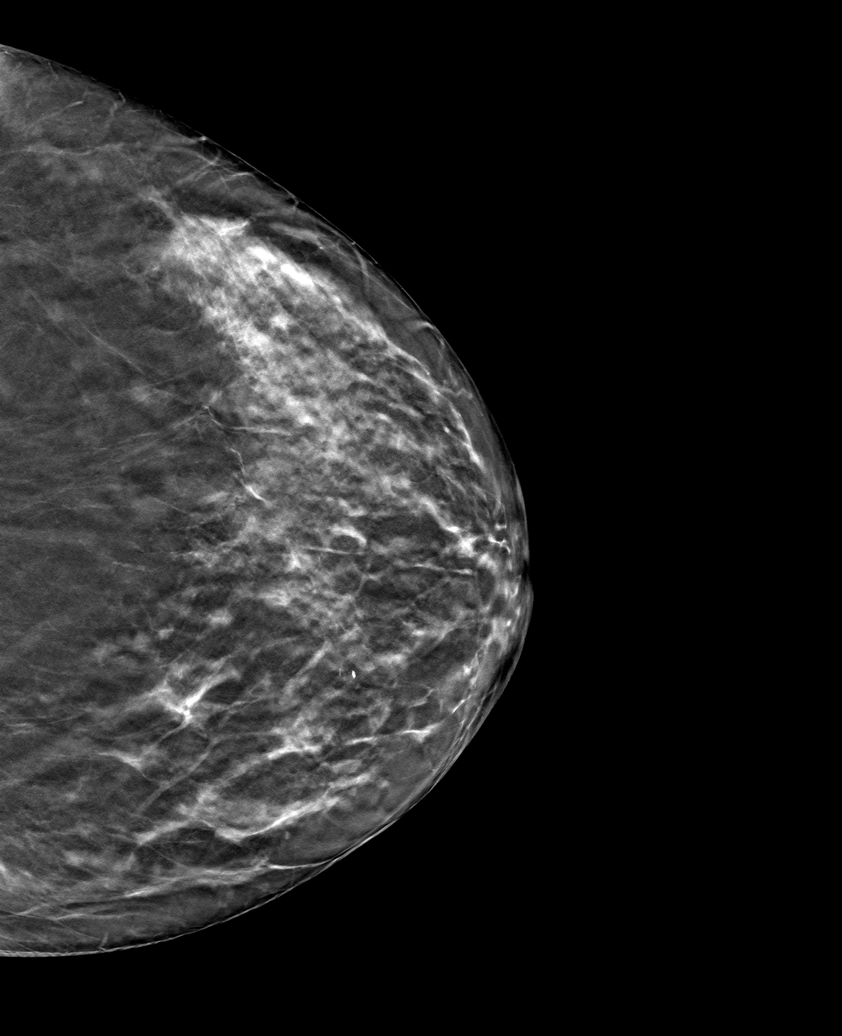

[R MLO tomo · tomo slice 35/68.0]
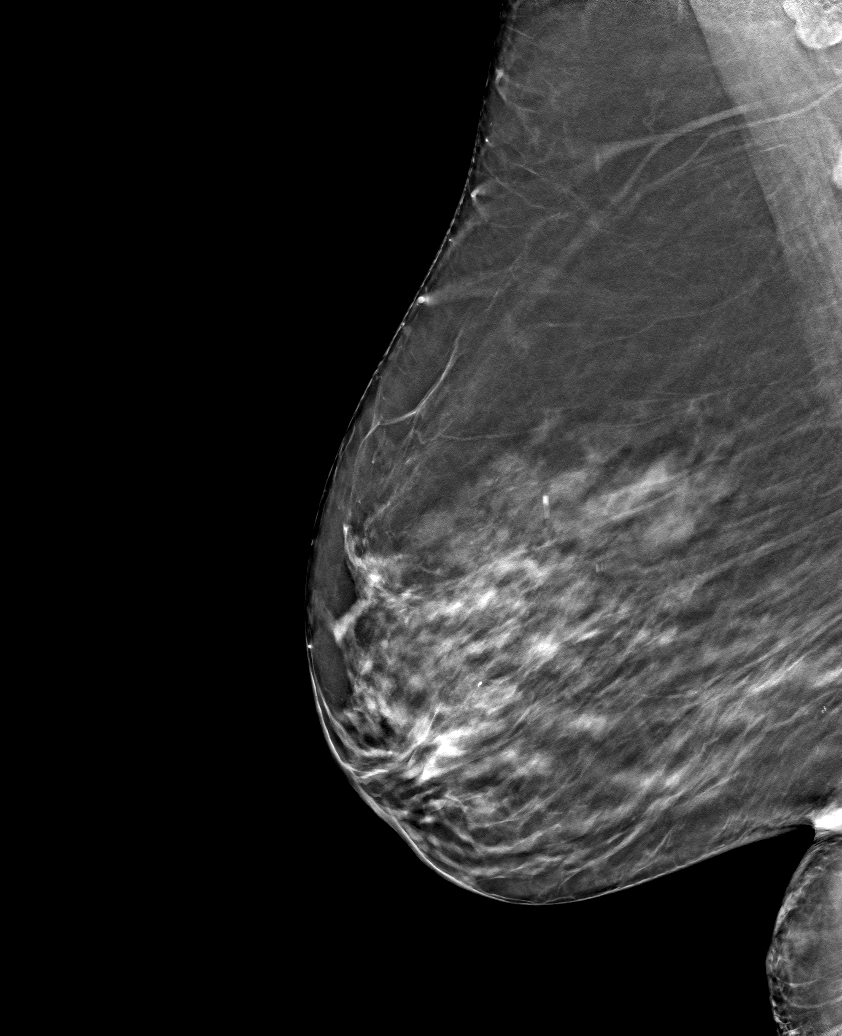

[R CC tomo · tomo slice 31/60.0]
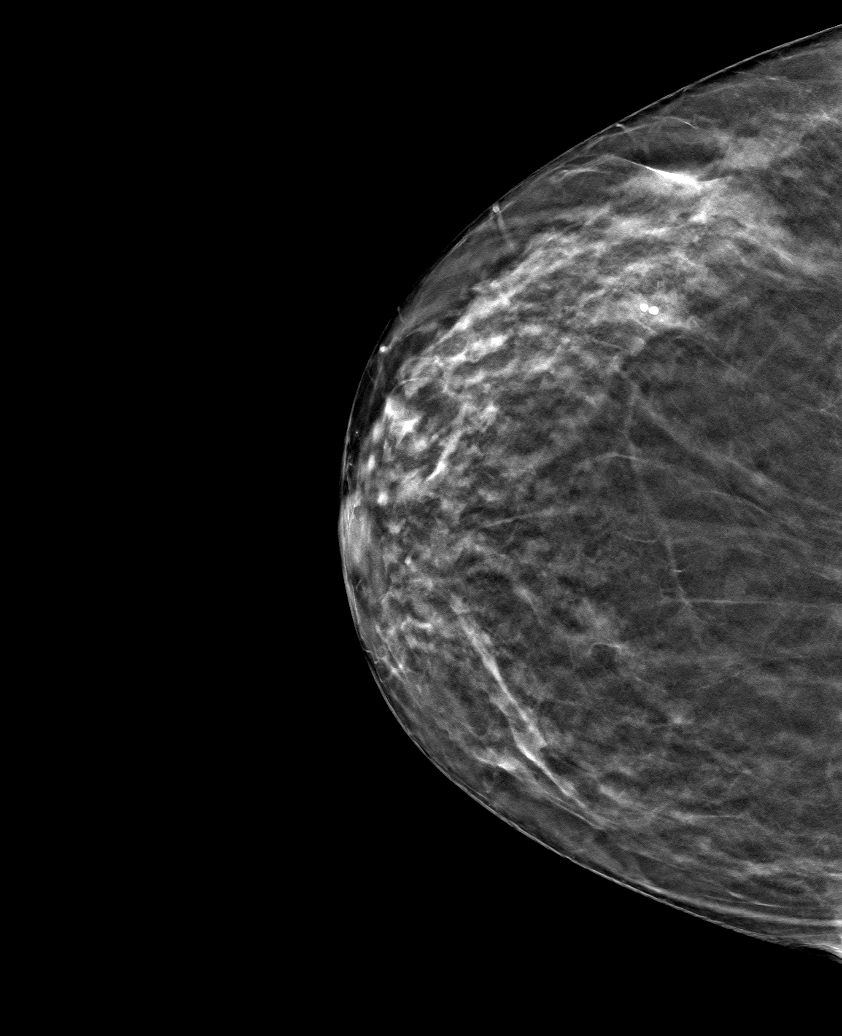

[6 of 18 positions shown; findings below may reference images not displayed]

ACR Breast Density Category c: The breast tissue is heterogeneously
dense, which may obscure small masses.
FINDINGS: In the right breast, a possible asymmetry warrants further
evaluation. In the left breast, no findings suspicious for
malignancy. Images were processed with CAD.
IMPRESSION: Further evaluation is suggested for possible asymmetry in the right
breast.

RECOMMENDATION:
Diagnostic mammogram and possibly ultrasound of the right breast.
(Code:EU-2-NNK)

The patient will be contacted regarding the findings, and additional
imaging will be scheduled.

BI-RADS CATEGORY  0: Incomplete. Need additional imaging evaluation
and/or prior mammograms for comparison.

## 2019-01-31 ENCOUNTER — Telehealth: Payer: Self-pay | Admitting: Physician Assistant

## 2019-01-31 ENCOUNTER — Encounter (HOSPITAL_COMMUNITY): Payer: Self-pay | Admitting: Emergency Medicine

## 2019-01-31 ENCOUNTER — Other Ambulatory Visit: Payer: Self-pay

## 2019-01-31 ENCOUNTER — Ambulatory Visit (INDEPENDENT_AMBULATORY_CARE_PROVIDER_SITE_OTHER): Payer: Medicare Other | Admitting: Physician Assistant

## 2019-01-31 ENCOUNTER — Ambulatory Visit: Payer: Medicare Other | Admitting: Cardiovascular Disease

## 2019-01-31 ENCOUNTER — Inpatient Hospital Stay (HOSPITAL_COMMUNITY)
Admission: EM | Admit: 2019-01-31 | Discharge: 2019-02-13 | DRG: 003 | Disposition: E | Payer: Medicare Other | Attending: Cardiothoracic Surgery | Admitting: Cardiothoracic Surgery

## 2019-01-31 ENCOUNTER — Encounter: Payer: Self-pay | Admitting: Physician Assistant

## 2019-01-31 ENCOUNTER — Emergency Department (HOSPITAL_COMMUNITY): Payer: Medicare Other

## 2019-01-31 VITALS — BP 130/66 | HR 61 | Temp 97.1°F | Ht 62.0 in | Wt 180.6 lb

## 2019-01-31 DIAGNOSIS — R809 Proteinuria, unspecified: Secondary | ICD-10-CM | POA: Diagnosis not present

## 2019-01-31 DIAGNOSIS — E1129 Type 2 diabetes mellitus with other diabetic kidney complication: Secondary | ICD-10-CM | POA: Diagnosis not present

## 2019-01-31 DIAGNOSIS — Z6835 Body mass index (BMI) 35.0-35.9, adult: Secondary | ICD-10-CM

## 2019-01-31 DIAGNOSIS — D689 Coagulation defect, unspecified: Secondary | ICD-10-CM | POA: Diagnosis not present

## 2019-01-31 DIAGNOSIS — D62 Acute posthemorrhagic anemia: Secondary | ICD-10-CM | POA: Diagnosis not present

## 2019-01-31 DIAGNOSIS — I314 Cardiac tamponade: Secondary | ICD-10-CM | POA: Diagnosis not present

## 2019-01-31 DIAGNOSIS — J95821 Acute postprocedural respiratory failure: Secondary | ICD-10-CM | POA: Diagnosis not present

## 2019-01-31 DIAGNOSIS — N189 Chronic kidney disease, unspecified: Secondary | ICD-10-CM | POA: Diagnosis not present

## 2019-01-31 DIAGNOSIS — L89316 Pressure-induced deep tissue damage of right buttock: Secondary | ICD-10-CM | POA: Diagnosis not present

## 2019-01-31 DIAGNOSIS — Z79899 Other long term (current) drug therapy: Secondary | ICD-10-CM | POA: Diagnosis not present

## 2019-01-31 DIAGNOSIS — Z8612 Personal history of poliomyelitis: Secondary | ICD-10-CM

## 2019-01-31 DIAGNOSIS — E1151 Type 2 diabetes mellitus with diabetic peripheral angiopathy without gangrene: Secondary | ICD-10-CM | POA: Diagnosis not present

## 2019-01-31 DIAGNOSIS — R0602 Shortness of breath: Secondary | ICD-10-CM | POA: Diagnosis not present

## 2019-01-31 DIAGNOSIS — K7200 Acute and subacute hepatic failure without coma: Secondary | ICD-10-CM | POA: Diagnosis not present

## 2019-01-31 DIAGNOSIS — I6389 Other cerebral infarction: Secondary | ICD-10-CM | POA: Diagnosis not present

## 2019-01-31 DIAGNOSIS — J90 Pleural effusion, not elsewhere classified: Secondary | ICD-10-CM | POA: Diagnosis not present

## 2019-01-31 DIAGNOSIS — Z515 Encounter for palliative care: Secondary | ICD-10-CM | POA: Diagnosis not present

## 2019-01-31 DIAGNOSIS — Z87442 Personal history of urinary calculi: Secondary | ICD-10-CM

## 2019-01-31 DIAGNOSIS — I214 Non-ST elevation (NSTEMI) myocardial infarction: Secondary | ICD-10-CM | POA: Diagnosis not present

## 2019-01-31 DIAGNOSIS — Z8673 Personal history of transient ischemic attack (TIA), and cerebral infarction without residual deficits: Secondary | ICD-10-CM | POA: Diagnosis not present

## 2019-01-31 DIAGNOSIS — Z86010 Personal history of colonic polyps: Secondary | ICD-10-CM

## 2019-01-31 DIAGNOSIS — J449 Chronic obstructive pulmonary disease, unspecified: Secondary | ICD-10-CM | POA: Diagnosis present

## 2019-01-31 DIAGNOSIS — D696 Thrombocytopenia, unspecified: Secondary | ICD-10-CM | POA: Diagnosis not present

## 2019-01-31 DIAGNOSIS — Z801 Family history of malignant neoplasm of trachea, bronchus and lung: Secondary | ICD-10-CM

## 2019-01-31 DIAGNOSIS — E785 Hyperlipidemia, unspecified: Secondary | ICD-10-CM | POA: Diagnosis present

## 2019-01-31 DIAGNOSIS — Z823 Family history of stroke: Secondary | ICD-10-CM

## 2019-01-31 DIAGNOSIS — E1122 Type 2 diabetes mellitus with diabetic chronic kidney disease: Secondary | ICD-10-CM | POA: Diagnosis not present

## 2019-01-31 DIAGNOSIS — N181 Chronic kidney disease, stage 1: Secondary | ICD-10-CM | POA: Diagnosis not present

## 2019-01-31 DIAGNOSIS — Z01810 Encounter for preprocedural cardiovascular examination: Secondary | ICD-10-CM | POA: Diagnosis not present

## 2019-01-31 DIAGNOSIS — R570 Cardiogenic shock: Secondary | ICD-10-CM | POA: Diagnosis not present

## 2019-01-31 DIAGNOSIS — Z66 Do not resuscitate: Secondary | ICD-10-CM | POA: Diagnosis not present

## 2019-01-31 DIAGNOSIS — E874 Mixed disorder of acid-base balance: Secondary | ICD-10-CM | POA: Diagnosis not present

## 2019-01-31 DIAGNOSIS — E1165 Type 2 diabetes mellitus with hyperglycemia: Secondary | ICD-10-CM | POA: Diagnosis not present

## 2019-01-31 DIAGNOSIS — N184 Chronic kidney disease, stage 4 (severe): Secondary | ICD-10-CM | POA: Diagnosis not present

## 2019-01-31 DIAGNOSIS — Z951 Presence of aortocoronary bypass graft: Secondary | ICD-10-CM

## 2019-01-31 DIAGNOSIS — I249 Acute ischemic heart disease, unspecified: Secondary | ICD-10-CM | POA: Diagnosis not present

## 2019-01-31 DIAGNOSIS — R34 Anuria and oliguria: Secondary | ICD-10-CM | POA: Diagnosis not present

## 2019-01-31 DIAGNOSIS — R680 Hypothermia, not associated with low environmental temperature: Secondary | ICD-10-CM | POA: Diagnosis not present

## 2019-01-31 DIAGNOSIS — E669 Obesity, unspecified: Secondary | ICD-10-CM | POA: Diagnosis present

## 2019-01-31 DIAGNOSIS — Z20828 Contact with and (suspected) exposure to other viral communicable diseases: Secondary | ICD-10-CM | POA: Diagnosis present

## 2019-01-31 DIAGNOSIS — D631 Anemia in chronic kidney disease: Secondary | ICD-10-CM | POA: Diagnosis not present

## 2019-01-31 DIAGNOSIS — R079 Chest pain, unspecified: Secondary | ICD-10-CM

## 2019-01-31 DIAGNOSIS — N170 Acute kidney failure with tubular necrosis: Secondary | ICD-10-CM | POA: Diagnosis not present

## 2019-01-31 DIAGNOSIS — Z4682 Encounter for fitting and adjustment of non-vascular catheter: Secondary | ICD-10-CM | POA: Diagnosis not present

## 2019-01-31 DIAGNOSIS — J439 Emphysema, unspecified: Secondary | ICD-10-CM | POA: Diagnosis not present

## 2019-01-31 DIAGNOSIS — I6522 Occlusion and stenosis of left carotid artery: Secondary | ICD-10-CM | POA: Diagnosis present

## 2019-01-31 DIAGNOSIS — Z880 Allergy status to penicillin: Secondary | ICD-10-CM

## 2019-01-31 DIAGNOSIS — J9811 Atelectasis: Secondary | ICD-10-CM | POA: Diagnosis not present

## 2019-01-31 DIAGNOSIS — R9431 Abnormal electrocardiogram [ECG] [EKG]: Secondary | ICD-10-CM | POA: Diagnosis present

## 2019-01-31 DIAGNOSIS — Z7982 Long term (current) use of aspirin: Secondary | ICD-10-CM

## 2019-01-31 DIAGNOSIS — I25110 Atherosclerotic heart disease of native coronary artery with unstable angina pectoris: Secondary | ICD-10-CM | POA: Diagnosis not present

## 2019-01-31 DIAGNOSIS — K2100 Gastro-esophageal reflux disease with esophagitis, without bleeding: Secondary | ICD-10-CM | POA: Diagnosis present

## 2019-01-31 DIAGNOSIS — Z8249 Family history of ischemic heart disease and other diseases of the circulatory system: Secondary | ICD-10-CM

## 2019-01-31 DIAGNOSIS — I129 Hypertensive chronic kidney disease with stage 1 through stage 4 chronic kidney disease, or unspecified chronic kidney disease: Secondary | ICD-10-CM | POA: Diagnosis not present

## 2019-01-31 DIAGNOSIS — R0789 Other chest pain: Secondary | ICD-10-CM | POA: Diagnosis not present

## 2019-01-31 DIAGNOSIS — I2510 Atherosclerotic heart disease of native coronary artery without angina pectoris: Secondary | ICD-10-CM | POA: Diagnosis not present

## 2019-01-31 DIAGNOSIS — R918 Other nonspecific abnormal finding of lung field: Secondary | ICD-10-CM | POA: Diagnosis not present

## 2019-01-31 DIAGNOSIS — Z452 Encounter for adjustment and management of vascular access device: Secondary | ICD-10-CM | POA: Diagnosis not present

## 2019-01-31 DIAGNOSIS — I959 Hypotension, unspecified: Secondary | ICD-10-CM | POA: Diagnosis not present

## 2019-01-31 DIAGNOSIS — I10 Essential (primary) hypertension: Secondary | ICD-10-CM | POA: Diagnosis not present

## 2019-01-31 DIAGNOSIS — I70201 Unspecified atherosclerosis of native arteries of extremities, right leg: Secondary | ICD-10-CM | POA: Diagnosis present

## 2019-01-31 DIAGNOSIS — Z808 Family history of malignant neoplasm of other organs or systems: Secondary | ICD-10-CM

## 2019-01-31 DIAGNOSIS — K219 Gastro-esophageal reflux disease without esophagitis: Secondary | ICD-10-CM

## 2019-01-31 DIAGNOSIS — Z4659 Encounter for fitting and adjustment of other gastrointestinal appliance and device: Secondary | ICD-10-CM

## 2019-01-31 DIAGNOSIS — I97791 Other intraoperative cardiac functional disturbances during other surgery: Secondary | ICD-10-CM | POA: Diagnosis not present

## 2019-01-31 DIAGNOSIS — L8990 Pressure ulcer of unspecified site, unspecified stage: Secondary | ICD-10-CM | POA: Insufficient documentation

## 2019-01-31 DIAGNOSIS — I4581 Long QT syndrome: Secondary | ICD-10-CM | POA: Diagnosis not present

## 2019-01-31 DIAGNOSIS — I081 Rheumatic disorders of both mitral and tricuspid valves: Secondary | ICD-10-CM | POA: Diagnosis not present

## 2019-01-31 DIAGNOSIS — Z825 Family history of asthma and other chronic lower respiratory diseases: Secondary | ICD-10-CM

## 2019-01-31 DIAGNOSIS — I509 Heart failure, unspecified: Secondary | ICD-10-CM | POA: Diagnosis not present

## 2019-01-31 DIAGNOSIS — Z9889 Other specified postprocedural states: Secondary | ICD-10-CM

## 2019-01-31 DIAGNOSIS — E7800 Pure hypercholesterolemia, unspecified: Secondary | ICD-10-CM | POA: Diagnosis not present

## 2019-01-31 DIAGNOSIS — J441 Chronic obstructive pulmonary disease with (acute) exacerbation: Secondary | ICD-10-CM | POA: Diagnosis not present

## 2019-01-31 DIAGNOSIS — Z87891 Personal history of nicotine dependence: Secondary | ICD-10-CM

## 2019-01-31 DIAGNOSIS — M21371 Foot drop, right foot: Secondary | ICD-10-CM | POA: Diagnosis present

## 2019-01-31 DIAGNOSIS — I998 Other disorder of circulatory system: Secondary | ICD-10-CM | POA: Diagnosis not present

## 2019-01-31 DIAGNOSIS — N183 Chronic kidney disease, stage 3 unspecified: Secondary | ICD-10-CM | POA: Diagnosis not present

## 2019-01-31 DIAGNOSIS — Z811 Family history of alcohol abuse and dependence: Secondary | ICD-10-CM

## 2019-01-31 DIAGNOSIS — Z7902 Long term (current) use of antithrombotics/antiplatelets: Secondary | ICD-10-CM

## 2019-01-31 DIAGNOSIS — Z800 Family history of malignant neoplasm of digestive organs: Secondary | ICD-10-CM

## 2019-01-31 DIAGNOSIS — E876 Hypokalemia: Secondary | ICD-10-CM | POA: Diagnosis not present

## 2019-01-31 DIAGNOSIS — Z803 Family history of malignant neoplasm of breast: Secondary | ICD-10-CM

## 2019-01-31 DIAGNOSIS — I4891 Unspecified atrial fibrillation: Secondary | ICD-10-CM | POA: Diagnosis not present

## 2019-01-31 DIAGNOSIS — D509 Iron deficiency anemia, unspecified: Secondary | ICD-10-CM | POA: Diagnosis not present

## 2019-01-31 DIAGNOSIS — S21109A Unspecified open wound of unspecified front wall of thorax without penetration into thoracic cavity, initial encounter: Secondary | ICD-10-CM | POA: Diagnosis not present

## 2019-01-31 DIAGNOSIS — E559 Vitamin D deficiency, unspecified: Secondary | ICD-10-CM | POA: Diagnosis not present

## 2019-01-31 DIAGNOSIS — Z833 Family history of diabetes mellitus: Secondary | ICD-10-CM

## 2019-01-31 DIAGNOSIS — R4020 Unspecified coma: Secondary | ICD-10-CM | POA: Diagnosis not present

## 2019-01-31 LAB — COMPREHENSIVE METABOLIC PANEL
ALT: 13 U/L (ref 0–44)
AST: 15 U/L (ref 15–41)
Albumin: 3.3 g/dL — ABNORMAL LOW (ref 3.5–5.0)
Alkaline Phosphatase: 82 U/L (ref 38–126)
Anion gap: 11 (ref 5–15)
BUN: 31 mg/dL — ABNORMAL HIGH (ref 8–23)
CO2: 21 mmol/L — ABNORMAL LOW (ref 22–32)
Calcium: 9.6 mg/dL (ref 8.9–10.3)
Chloride: 106 mmol/L (ref 98–111)
Creatinine, Ser: 2.4 mg/dL — ABNORMAL HIGH (ref 0.44–1.00)
GFR calc Af Amer: 22 mL/min — ABNORMAL LOW (ref >60)
GFR calc non Af Amer: 19 mL/min — ABNORMAL LOW (ref >60)
Glucose, Bld: 371 mg/dL — ABNORMAL HIGH (ref 70–99)
Potassium: 3.7 mmol/L (ref 3.5–5.1)
Sodium: 138 mmol/L (ref 135–145)
Total Bilirubin: 0.5 mg/dL (ref 0.3–1.2)
Total Protein: 6.5 g/dL (ref 6.5–8.1)

## 2019-01-31 LAB — CBC WITH DIFFERENTIAL/PLATELET
Abs Immature Granulocytes: 0.03 10*3/uL (ref 0.00–0.07)
Basophils Absolute: 0.1 10*3/uL (ref 0.0–0.1)
Basophils Relative: 1 %
Eosinophils Absolute: 0.3 10*3/uL (ref 0.0–0.5)
Eosinophils Relative: 3 %
HCT: 35.9 % — ABNORMAL LOW (ref 36.0–46.0)
Hemoglobin: 11.2 g/dL — ABNORMAL LOW (ref 12.0–15.0)
Immature Granulocytes: 0 %
Lymphocytes Relative: 16 %
Lymphs Abs: 1.7 10*3/uL (ref 0.7–4.0)
MCH: 27.6 pg (ref 26.0–34.0)
MCHC: 31.2 g/dL (ref 30.0–36.0)
MCV: 88.4 fL (ref 80.0–100.0)
Monocytes Absolute: 0.6 10*3/uL (ref 0.1–1.0)
Monocytes Relative: 6 %
Neutro Abs: 8 10*3/uL — ABNORMAL HIGH (ref 1.7–7.7)
Neutrophils Relative %: 74 %
Platelets: 263 10*3/uL (ref 150–400)
RBC: 4.06 MIL/uL (ref 3.87–5.11)
RDW: 13.1 % (ref 11.5–15.5)
WBC: 10.8 10*3/uL — ABNORMAL HIGH (ref 4.0–10.5)
nRBC: 0 % (ref 0.0–0.2)

## 2019-01-31 LAB — TROPONIN I (HIGH SENSITIVITY)
Troponin I (High Sensitivity): 80 ng/L — ABNORMAL HIGH (ref <18)
Troponin I (High Sensitivity): 546 ng/L (ref <18)

## 2019-01-31 LAB — BAYER DCA HB A1C WAIVED: HB A1C (BAYER DCA - WAIVED): 10.5 % — ABNORMAL HIGH (ref <7.0)

## 2019-01-31 LAB — SARS CORONAVIRUS 2 BY RT PCR (HOSPITAL ORDER, PERFORMED IN ~~LOC~~ HOSPITAL LAB): SARS Coronavirus 2: NEGATIVE

## 2019-01-31 LAB — PROTIME-INR
INR: 1 (ref 0.8–1.2)
Prothrombin Time: 13.4 seconds (ref 11.4–15.2)

## 2019-01-31 LAB — HEPARIN LEVEL (UNFRACTIONATED): Heparin Unfractionated: 0.82 IU/mL — ABNORMAL HIGH (ref 0.30–0.70)

## 2019-01-31 LAB — GLUCOSE, CAPILLARY: Glucose-Capillary: 311 mg/dL — ABNORMAL HIGH (ref 70–99)

## 2019-01-31 MED ORDER — CLOPIDOGREL BISULFATE 75 MG PO TABS
75.0000 mg | ORAL_TABLET | Freq: Every day | ORAL | Status: DC
  Administered 2019-01-31 – 2019-02-02 (×3): 75 mg via ORAL
  Filled 2019-01-31 (×2): qty 1

## 2019-01-31 MED ORDER — DOXAZOSIN MESYLATE 2 MG PO TABS
2.0000 mg | ORAL_TABLET | Freq: Every evening | ORAL | Status: DC
  Filled 2019-01-31 (×2): qty 1

## 2019-01-31 MED ORDER — INSULIN ASPART 100 UNIT/ML ~~LOC~~ SOLN
0.0000 [IU] | Freq: Three times a day (TID) | SUBCUTANEOUS | Status: DC
  Administered 2019-02-01 (×2): 1 [IU] via SUBCUTANEOUS
  Administered 2019-02-01: 3 [IU] via SUBCUTANEOUS
  Administered 2019-02-02 (×2): 2 [IU] via SUBCUTANEOUS
  Administered 2019-02-03: 5 [IU] via SUBCUTANEOUS

## 2019-01-31 MED ORDER — ATORVASTATIN CALCIUM 40 MG PO TABS
40.0000 mg | ORAL_TABLET | Freq: Every day | ORAL | Status: DC
  Administered 2019-02-01 – 2019-02-04 (×3): 40 mg via ORAL
  Filled 2019-01-31 (×3): qty 1

## 2019-01-31 MED ORDER — HEPARIN BOLUS VIA INFUSION
4000.0000 [IU] | Freq: Once | INTRAVENOUS | Status: AC
  Administered 2019-01-31: 4000 [IU] via INTRAVENOUS

## 2019-01-31 MED ORDER — ACETAMINOPHEN 325 MG PO TABS
650.0000 mg | ORAL_TABLET | ORAL | Status: DC | PRN
  Administered 2019-02-02 (×2): 650 mg via ORAL
  Filled 2019-01-31 (×2): qty 2

## 2019-01-31 MED ORDER — MORPHINE SULFATE (PF) 2 MG/ML IV SOLN
2.0000 mg | Freq: Once | INTRAVENOUS | Status: AC
  Administered 2019-01-31: 2 mg via INTRAVENOUS

## 2019-01-31 MED ORDER — ONDANSETRON HCL 4 MG/2ML IJ SOLN
4.0000 mg | Freq: Four times a day (QID) | INTRAMUSCULAR | Status: DC | PRN
  Administered 2019-02-01 – 2019-02-02 (×3): 4 mg via INTRAVENOUS
  Filled 2019-01-31 (×3): qty 2

## 2019-01-31 MED ORDER — PANTOPRAZOLE SODIUM 20 MG PO TBEC
20.0000 mg | DELAYED_RELEASE_TABLET | Freq: Every day | ORAL | Status: DC
  Administered 2019-02-01 – 2019-02-02 (×2): 20 mg via ORAL
  Filled 2019-01-31 (×3): qty 1

## 2019-01-31 MED ORDER — NITROGLYCERIN 0.4 MG SL SUBL: 0.4000 mg | SUBLINGUAL_TABLET | SUBLINGUAL | Status: DC | PRN

## 2019-01-31 MED ORDER — NITROGLYCERIN IN D5W 200-5 MCG/ML-% IV SOLN
5.0000 ug/min | INTRAVENOUS | Status: DC
  Administered 2019-01-31: 5 ug/min via INTRAVENOUS
  Administered 2019-02-02: 30 ug/min via INTRAVENOUS
  Administered 2019-02-02: 80 ug/min via INTRAVENOUS
  Administered 2019-02-03: 160 ug/min via INTRAVENOUS
  Filled 2019-01-31 (×5): qty 250

## 2019-01-31 MED ORDER — CARVEDILOL 12.5 MG PO TABS
12.5000 mg | ORAL_TABLET | Freq: Two times a day (BID) | ORAL | Status: DC
  Administered 2019-01-31 – 2019-02-03 (×4): 12.5 mg via ORAL
  Filled 2019-01-31 (×4): qty 1

## 2019-01-31 MED ORDER — LINACLOTIDE 145 MCG PO CAPS
145.0000 ug | ORAL_CAPSULE | Freq: Every day | ORAL | Status: DC | PRN
  Filled 2019-01-31: qty 1

## 2019-01-31 MED ORDER — AMLODIPINE BESYLATE 10 MG PO TABS
10.0000 mg | ORAL_TABLET | Freq: Every day | ORAL | Status: DC
  Administered 2019-02-01: 10 mg via ORAL
  Filled 2019-01-31: qty 1

## 2019-01-31 MED ORDER — ONDANSETRON HCL 4 MG/2ML IJ SOLN
4.0000 mg | Freq: Once | INTRAMUSCULAR | Status: AC
  Administered 2019-01-31: 4 mg via INTRAVENOUS
  Filled 2019-01-31: qty 2

## 2019-01-31 MED ORDER — INSULIN ASPART 100 UNIT/ML ~~LOC~~ SOLN
0.0000 [IU] | Freq: Every day | SUBCUTANEOUS | Status: DC
  Administered 2019-01-31: 4 [IU] via SUBCUTANEOUS
  Administered 2019-02-01 – 2019-02-02 (×2): 3 [IU] via SUBCUTANEOUS

## 2019-01-31 MED ORDER — MORPHINE SULFATE (PF) 2 MG/ML IV SOLN
INTRAVENOUS | Status: AC
  Filled 2019-01-31: qty 1

## 2019-01-31 MED ORDER — ASPIRIN EC 325 MG PO TBEC: 325.0000 mg | DELAYED_RELEASE_TABLET | Freq: Every day | ORAL | Status: DC

## 2019-01-31 MED ORDER — ALBUTEROL SULFATE (2.5 MG/3ML) 0.083% IN NEBU: 2.5000 mg | INHALATION_SOLUTION | Freq: Four times a day (QID) | RESPIRATORY_TRACT | Status: DC | PRN

## 2019-01-31 MED ORDER — BLOOD GLUCOSE METER KIT: PACK | 0 refills | Status: AC

## 2019-01-31 MED ORDER — LORAZEPAM 2 MG/ML IJ SOLN
1.0000 mg | Freq: Once | INTRAMUSCULAR | Status: AC
  Administered 2019-01-31: 1 mg via INTRAVENOUS
  Filled 2019-01-31: qty 1

## 2019-01-31 MED ORDER — MORPHINE SULFATE (PF) 4 MG/ML IV SOLN
2.0000 mg | Freq: Once | INTRAVENOUS | Status: AC
  Administered 2019-01-31: 2 mg via INTRAVENOUS
  Filled 2019-01-31: qty 1

## 2019-01-31 MED ORDER — ASPIRIN 81 MG PO CHEW
81.0000 mg | CHEWABLE_TABLET | Freq: Once | ORAL | Status: AC
  Administered 2019-01-31: 81 mg via ORAL
  Filled 2019-01-31: qty 1

## 2019-01-31 MED ORDER — SODIUM CHLORIDE 0.9 % IV SOLN: INTRAVENOUS | Status: AC

## 2019-01-31 MED ORDER — CALCIFEDIOL ER 30 MCG PO CPCR: 30.0000 ug | ORAL_CAPSULE | Freq: Every day | ORAL | Status: DC

## 2019-01-31 MED ORDER — SITAGLIPTIN PHOSPHATE 50 MG PO TABS: 50.0000 mg | ORAL_TABLET | Freq: Every day | ORAL | 5 refills | Status: AC

## 2019-01-31 MED ORDER — ALBUTEROL SULFATE HFA 108 (90 BASE) MCG/ACT IN AERS
2.0000 | INHALATION_SPRAY | Freq: Four times a day (QID) | RESPIRATORY_TRACT | Status: DC | PRN
  Filled 2019-01-31: qty 6.7

## 2019-01-31 MED ORDER — HEPARIN (PORCINE) 25000 UT/250ML-% IV SOLN
700.0000 [IU]/h | INTRAVENOUS | Status: DC
  Administered 2019-01-31: 850 [IU]/h via INTRAVENOUS
  Filled 2019-01-31 (×2): qty 250

## 2019-01-31 NOTE — ED Provider Notes (Signed)
This patient is a ill-appearing 73 year old female who has multiple medical problems including prior stroke, diabetes, history of high cholesterol, hypertension, hyperlipidemia who presents with a complaint of chest discomfort which is a heaviness on her chest, she seems to be very nauseated with it and states it has been fluctuating for the last 2 weeks albeit short-lived but today has been worse intense and persistent.  She presents from the office after showing up to have an EKG done.  The EKG here shows that she has ST depressions in the inferior leads which are chronic compared to old EKGs but there are new ST depressions in the anterior leads V2, V3, V4, V5 and V6 which are abnormal and seem to be new.  On my exam the patient has clear lung sounds with mild wheezing, she has clear heart sounds without obvious murmurs, she appears to be uncomfortable and is clutching her chest.  I discussed the patient's care with Dr. Harl Bowie of the cardiology service at 1:25 PM, he will come see the patient, recommends and agrees with the nitroglycerin drip, heparin drip, morphine.  I anticipate the patient will need to be admitted, I am concerned for a posterior myocardial infarction and thus requested cardiology's evaluation.  I appreciate Dr. Harl Bowie and his willingness to come see the patient.  Labs pending Portable chest pending Cardiac Monitoring   Heparin drip  Nitroglycerin drip  Morphine  Aspirin  Dr. Harl Bowie is seen the patient, agreeable that this is likely an acute coronary syndrome, the patient was started on multiple medications, troponin elevated, repeat troponin more elevated, the patient will need to go to the cards service for consideration of cath lab intervention at [place] Performed by: Noemi Chapel, MD Authorized by: Noemi Chapel, MD   Critical care provider statement:    Critical care time (minutes):  35   Critical care time was exclusive of:   Separately billable procedures and treating other patients and teaching time   Critical care was necessary to treat or prevent imminent or life-threatening deterioration of the following conditions:  Cardiac failure   Critical care was time spent personally by me on the following activities:  Blood draw for specimens, development of treatment plan with patient or surrogate, discussions with consultants, evaluation of patient's response to treatment, examination of patient, obtaining history from patient or surrogate, ordering and performing treatments and interventions, ordering and review of laboratory studies, ordering and review of radiographic studies, pulse oximetry, re-evaluation of patient's condition and review of old charts    EKG Interpretation  Date/Time:  Monday January 31 2019 13:09:32 EDT Ventricular Rate:  74 PR Interval:    QRS Duration: 88 QT Interval:  327 QTC Calculation: 363 R Axis:   15 Text Interpretation:  Sinus rhythm Abnormal R-wave progression, early transition Repol abnrm, severe global ischemia (LM/MVD) Baseline wander in lead(s) I aVR ST depression anterior leads of concer and not seen on prior Confirmed by Noemi Chapel [phone]) on 01/17/2019 1:17:17 PM        Medical screening examination/treatment/procedure(s) were conducted as a shared visit with non-physician practitioner(s) and myself.  I personally evaluated the patient during the encounter.  Clinical Impression:   Final diagnoses:  Acute coronary syndrome [facility])         Noemi Chapel, MD 02/01/19 [phone]

## 2019-01-31 NOTE — ED Notes (Signed)
CRITICAL VALUE ALERT  Critical Value:  Troponin 80  Date & Time Notied:  01/29/2019 1648  Provider Notified: Dr. Harl Bowie   Orders Received/Actions taken: Cardiology Consult

## 2019-01-31 NOTE — ED Notes (Signed)
Report given to [facility] 

## 2019-01-31 NOTE — ED Notes (Addendum)
Have asked AC to bring Carvedilol and Plavix

## 2019-01-31 NOTE — H&P (Signed)
Cardiology Admission History and Physical:   Patient ID: Paula Watson MRN: [id number]; DOB: November 23, 1945   Admission date: 02/07/2019  Primary Care Provider: Terald Sleeper, PA-C Primary Cardiologist: Dr Bronson Ing Primary Electrophysiologist:  None   Chief Complaint:  Chest pain  Patient Profile:   Paula Watson is a 73 y.o. female  female with a hx of CVA and carotid stenosis who is being seen today for the evaluation of chest pain   History of Present Illness:   Ms. Paula Watson is a 73 yo female history of CVA s/p stenting to right internal carotid, chronic left common carotid occlusion, history of subclavian to carotid bypass. History of DM2, CKD presents with chest pain.   Chart has mentioned a history of CAD with prior stentint in 2007 however from clinic note  " However, we contacted the cardiac catheterization laboratory at [facility] and it appears the electronic medical record is incorrect and that she has never undergone cardiac catheterization nor has a history of coronary artery stenting or MI."  Presents with chest pain starting 2 weeks ago. Pressure 10/10 mid chest with some assocaited nausea, lasts about 10-15 minutes. Increasing DOE over the last 2 weeks. Nopt positional. Increasing in frequency and severity since onset.     Labs issues crossing over, I have asked they be included in the paper chart  K 3.7 Cr 2.4 GFR 19 Hgb 11.2 Plt 263   hstrop 80-->  EKG SR, diffuse ST depressions  02/2017 echo Study Conclusions  - Left ventricle: The cavity size was normal. Wall thickness was increased in a pattern of moderate LVH. Systolic function was normal. The estimated ejection fraction was in the range of 60% to 65%. Wall motion was normal; there were no regional wall motion abnormalities. Left ventricular diastolic function parameters were normal. - Atrial septum: No defect or patent foramen ovale was identified.       Heart Pathway  Score:     Past Medical History:  Diagnosis Date  . Anemia    Iron deficiency  . Aneurysm of artery ([place])   . Blood transfusion   . Bronchitis    "have had a couple of times in my lifetime"  . Cancer ([place]) 1985   cervical  . Cataract   . CKD (chronic kidney disease) stage 1, GFR 90 ml/min or greater was told by her PCP   Intolerant to ACE according to her PCP  . Diabetes mellitus without complication ([place])   . Dyspnea   . Dysrhythmia    :"SKIPS A BEAT "   SCHEDULED FOR STRESS TEST 2019  . GERD (gastroesophageal reflux disease)   . High cholesterol   . History of hiatal hernia   . History of kidney stones   . Hyperlipidemia   . Hypertension   . Poliomyelitis since 1948   has drop foot on R  . Stroke [facility]) 2007 affected R side  . Vertigo now improved    Past Surgical History:  Procedure Laterality Date  . CAROTID ENDARTERECTOMY Left 2000 L side  . CERVICAL CONIZATION W/BX  1985  . COLONOSCOPY  2010   Dr. Laural Golden: tubular adenoma.   . COLONOSCOPY N/A 03/03/2018   Procedure: COLONOSCOPY;  Surgeon: Daneil Dolin, MD;  Location[facility];  Service: Endoscopy;  Laterality: N/A;  2:00pm  . CORONARY ANGIOPLASTY  stented 2007  . DILATION AND CURETTAGE OF UTERUS  1980's  . foot reconstructjion  1953   right foot; post polio  .  IR ANGIO EXTERNAL CAROTID SEL EXT CAROTID UNI R MOD SED  03/06/2017  . IR ANGIO INTRA EXTRACRAN SEL COM CAROTID INNOMINATE UNI R MOD SED  03/24/2017  . IR ANGIO VERTEBRAL SEL SUBCLAVIAN INNOMINATE BILAT MOD SED  03/09/2017  . IR ANGIO VERTEBRAL SEL VERTEBRAL BILAT MOD SED  03/06/2017  . IR RADIOLOGIST EVAL & MGMT  07/21/2016  . POLYPECTOMY  03/03/2018   Procedure: POLYPECTOMY;  Surgeon: Daneil Dolin, MD;  Location: [facility];  Service: Endoscopy;;  colon  . RADIOLOGY WITH ANESTHESIA N/A 03/24/2017   Procedure: STENTING;  Surgeon: Luanne Bras, MD;  Location: [place];  Service: Radiology;  Laterality: N/A;  . TONSILLECTOMY  1954      Medications Prior to Admission: Prior to Admission medications   Medication Sig Start Date End Date Taking? Authorizing Provider  amLODipine (NORVASC) 10 MG tablet Take 1 tablet by mouth once daily Patient taking differently: Take 10 mg by mouth daily.  08/24/18  Yes Terald Sleeper, PA-C  carvedilol (COREG) 12.5 MG tablet TAKE 1 TABLET BY MOUTH TWICE DAILY WITH MEALS Patient taking differently: Take 12.5 mg by mouth 2 (two) times daily with a meal.  09/08/18  Yes Terald Sleeper, PA-C  clopidogrel (PLAVIX) 75 MG tablet Take 1 tablet (75 mg total) by mouth daily. 09/08/18  Yes Terald Sleeper, PA-C  doxazosin (CARDURA) 2 MG tablet Take 2 mg by mouth every evening.  01/06/18  Yes [provider]  furosemide (LASIX) 20 MG tablet Take 1-2 tablets (20-40 mg total) by mouth daily. Patient taking differently: Take 20-40 mg by mouth See admin instructions. Take 1 tablet (20 mg) by mouth alternating with 2 tablets (40 mg) by mouth every other day cyclically. 01/07/18  Yes Terald Sleeper, PA-C  losartan (COZAAR) 50 MG tablet Take 50 mg by mouth daily. 01/24/19  Yes [provider]  pantoprazole (PROTONIX) 20 MG tablet Take 1 tablet (20 mg total) by mouth daily. (Needs to be seen before next refill) Patient taking differently: Take 20 mg by mouth daily.  07/28/18  Yes Terald Sleeper, PA-C  pravastatin (PRAVACHOL) 20 MG tablet Take 1 tablet (20 mg total) by mouth daily. 07/26/18  Yes Particia Nearing S, PA-C  RAYALDEE 30 MCG CPCR Take 30 mcg by mouth at bedtime.   Yes [provider]  sitaGLIPtin (JANUVIA) 50 MG tablet Take 1 tablet (50 mg total) by mouth daily. 01/22/2019  Yes Terald Sleeper, PA-C  albuterol (PROVENTIL HFA;VENTOLIN HFA) 108 (90 Base) MCG/ACT inhaler Inhale 2 puffs into the lungs every 6 (six) hours as needed for wheezing or shortness of breath. 06/12/17   Terald Sleeper, PA-C  aspirin EC 325 MG EC tablet Take 1 tablet (325 mg total) by mouth daily. 03/09/17   Rinehuls, Early Chars,  PA-C  blood glucose meter kit and supplies Dispense based on patient and insurance preference.Check Blood sugar three times a day. DX E11.65 01/28/2019   Terald Sleeper, PA-C  clotrimazole-betamethasone (LOTRISONE) lotion Apply topically 2 (two) times daily. 07/28/18   Terald Sleeper, PA-C  hydrocortisone (ANUSOL-HC) 2.5 % rectal cream Place 1 application rectally 2 (two) times daily as needed for hemorrhoids. 07/28/18   Terald Sleeper, PA-C  linaclotide [facility]) 145 MCG CAPS capsule Take 1 capsule (145 mcg total) by mouth daily before breakfast. Patient taking differently: Take 145 mcg by mouth daily as needed (for constipation.).  06/12/17   Terald Sleeper, PA-C  mupirocin cream (BACTROBAN) 2 %  Apply 1 application topically 2 (two) times daily. Patient taking differently: Apply 1 application topically 2 (two) times daily as needed (irritation.).  06/09/16   Terald Sleeper, PA-C  polyethylene glycol-electrolytes (TRILYTE) 420 g solution Take 4,000 mLs by mouth as directed. 01/15/18   Rourk, Cristopher Estimable, MD     Allergies:    Allergies  Allergen Reactions  . Penicillins Hives and Swelling    PATIENT HAS HAD A PCN REACTION WITH IMMEDIATE RASH, FACIAL/TONGUE/THROAT SWELLING, SOB, OR LIGHTHEADEDNESS WITH HYPOTENSION:  #  #  #  YES  #  #  #   Has patient had a PCN reaction causing severe rash involving mucus membranes or skin necrosis: Unknown Has patient had a PCN reaction that required hospitalization: No Has patient had a PCN reaction occurring within the last 10 years: No     Social History:   Social History   Socioeconomic History  . Marital status: Divorced    Spouse name: Not on file  . Number of children: 2  . Years of education: 73  . Highest education level: 12th grade  Occupational History  . 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 Financial resource strain: Somewhat hard  . Food insecurity    Worry: Never true    Inability:  Never true  . Transportation needs    Medical: No    Non-medical: No  Tobacco Use  . Smoking status: Former Smoker    Packs/day: 0.25    Years: 50.00    Pack years: 12.50    Types: Cigarettes    Quit date: 03/03/2017    Years since quitting: 1.9  . Smokeless tobacco: Never Used  Substance and Sexual Activity  . Alcohol use: No  . Drug use: No  . Sexual activity: Not Currently    Birth control/protection: Post-menopausal  Lifestyle  . Physical activity    Days per week: 0 days    Minutes per session: 0 min  . Stress: Rather much  Relationships  . Social connections    Talks on phone: More than three times a week    Gets together: More than three times a week    Attends religious service: Never    Active member of club or organization: No    Attends meetings of clubs or organizations: Never    Relationship status: Divorced  . Intimate partner violence    Fear of current or ex partner: No    Emotionally abused: No    Physically abused: No    Forced sexual activity: No  Other Topics Concern  . Not on file  Social History Narrative  . Not on file    Family History:   The patient's family history includes Alcohol abuse in her son; Brain cancer in her father; Breast cancer in her maternal grandmother, paternal grandmother, sister, and another family member; COPD in her maternal grandmother, paternal grandmother, and another family member; Cancer in her father and sister; Colon cancer in an other family member; Coronary artery disease in her brother and mother; Diabetes in her father and sister; Diabetes type II in her father; Drug abuse in her son; Healthy in her daughter; Heart attack in her brother; Heart disease in her brother and mother; Lung cancer in her brother; Obesity in her sister; Stroke in her mother, paternal grandfather, and sister.    ROS:  Please see the history of present illness.  All other ROS reviewed and  negative.     Physical Exam/Data:   Vitals:    02/11/2019 1430 01/26/2019 1500 02/07/2019 1530 02/01/2019 1600  BP: (!) 141/62 (!) 144/71 (!) 139/57 127/67  Pulse: 64 68 63 65  Resp: 16 17 11 13   Temp:      TempSrc:      SpO2: 94% 100% 97% 94%  Weight:      Height:       No intake or output data in the 24 hours ending 02/07/2019 1630 Last 3 Weights 02/06/2019 01/25/2019 03/03/2018  Weight (lbs) 180 lb 180 lb 9.6 oz 169 lb 1.5 oz  Weight (kg) 81.647 kg 81.92 kg 76.7 kg     Body mass index is 32.92 kg/m.  General:  Well nourished, well developed, in no acute distress HEENT: normal Lymph: no adenopathy Neck: no JVD Endocrine:  No thryomegaly Cardiac:  normal S1, S2; RRR; no murmur  Lungs:  clear to auscultation bilaterally, no wheezing, rhonchi or rales  Abd: soft, nontender, no hepatomegaly  Ext: no  edema Musculoskeletal:  No deformities, BUE and BLE strength normal and equal Skin: warm and dry  Neuro:  CNs 2-12 intact, no focal abnormalities noted Psych:  Normal affect      Laboratory Data:  High Sensitivity Troponin:  No results for input(s): TROPONINIHS in the last 720 hours.    ChemistryNo results for input(s): NA, K, CL, CO2, GLUCOSE, BUN, CREATININE, CALCIUM, GFRNONAA, GFRAA, ANIONGAP in the last 168 hours.  No results for input(s): PROT, ALBUMIN, AST, ALT, ALKPHOS, BILITOT in the last 168 hours. HematologyNo results for input(s): WBC, RBC, HGB, HCT, MCV, MCH, MCHC, RDW, PLT in the last 168 hours. BNPNo results for input(s): BNP, PROBNP in the last 168 hours.  DDimer No results for input(s): DDIMER in the last 168 hours.   Radiology/Studies:  Dg Chest Port 1 View  Result Date: 02/01/2019 CLINICAL DATA:  Chest pain.  Hypertension. EXAM: PORTABLE CHEST 1 VIEW COMPARISON:  February 19, 2011 FINDINGS: There is no edema or consolidation. Heart size and pulmonary vascularity are normal. No adenopathy. There is aortic atherosclerosis. There are surgical clips in the left cervicothoracic junction region. IMPRESSION: No edema or  consolidation. Cardiac silhouette within normal limits. Aortic Atherosclerosis (ICD10-I70.0). Electronically Signed   By: Lowella Grip III M.D.   On: 01/25/2019 14:39    Assessment and Plan:   Labs issues crossing over, I have asked they be included in the paper chart. Pertient results thus far K 3.7 Cr 2.4 GFR 19 Hgb 11.2 Plt 263   hstrop 80-->   1. Chest pain/NSTEMI - accelerating chest pain symptoms in patient with known vascular disease, very poorly controlled diabetic with HgbA1c 10.5 -prior EKGs show some chronic ST/T changes, but more prominent on this presentation with diffuse ST depression and aVR elevation concerning for diffuse ischemia. Particularly more prominent changes anteiror and lateral leads - at this time consistent wht NSTEMI - medical therapy initiated with hep gtt, NG gtt. Has become pain free on NG gtt.   - will need to weigh risks vs benefits of cath in setting of her NSTEMI vs her chronic renal dysfunction and risk of contrast neprhopathy as more date comes back regarding risks. Gentle IVFs overnight and follow up Cr, trop trend, echo, and symptoms overnight and into the AM.    2. DM2 HgbA21 10.5 by labs today  3. Vascular disease - history of carotid disease with prior intervention, has been on ASA and plavix. Actually on full dose ASA  from vascular standpoint, not cardiac.    4. CKD IV - gentle IVFs overnight, hold nephrotoxic meds     For questions or updates, please contact [place] Please consult [email or web address] for contact info under        Signed, Carlyle Dolly, MD  01/20/2019 4:30 PM

## 2019-01-31 NOTE — Progress Notes (Addendum)
   Admit orders entered per discussion with Dr. Harl Bowie as below:  - IV heparin gtt - IV NTG gtt - continue home oral meds with exception of a) hold Lasix & losartan given AKI, b) hold oral diabetic meds in lieu of sliding scale insulin, and c) change pravastatin to atorvastatin - IV fluids at 75/hr for 1 L total (about 14 hrs) - spoke with ER nurse who helped verify with patient that she only took Protonix and a full dose ASA by EMS today - therefore carvedilol + Plavix were released to try to get these to her so she doesn't get too off schedule with them. After reconciling orders, I wrote care order with these instructions and d/w nursing secretary to notify nurse - Dr. Harl Bowie recommends continuing full dose ASA given her carotid history - 2D echo tomorrow - hold off scheduling cath until more is known about kidney function - stepdown bed - NPO after midnight in case kidney function improves - sent message to early team tomorrow to see in rounds early to help dispo plan for cath (and provide diet if no cath planned)  [facility] has been notified. Also notified cardmaster pager and spoke with Sande Rives, APP on call. Order placed for rapid covid testing in case situation changes and more expedited cath is needed. Indyah Saulnier PA-C

## 2019-01-31 NOTE — ED Triage Notes (Signed)
Chest "heavyness" that started today cbg 432

## 2019-01-31 NOTE — ED Provider Notes (Signed)
[facility] EMERGENCY DEPARTMENT Provider Note   CSN: [id number] Arrival date & time: 01/23/2019  1258     History   Chief Complaint Chief Complaint  Patient presents with   Chest Pain    HPI Paula Watson is a 73 y.o. female with past medical history significant for diabetes mellitus, CKD, hypertension, TIA, and hx of CVA in 2007 & 2018 who presents to the ED via EMS from PCP due to sudden onset of intermittent, central chest pain that started 2 weeks ago. Patient describes the pain as "heaviness" which typically begins when she is laying down. Pain is alleviated by burping. Chest pain is associated with nausea, shortness of breath, and 1 episode of non bloody, non bilious emesis today. Pain occurs both at rest and with exertion. Patient denies previous chest pain episodes. She was seen by a cardiologist for stroke follow-up in 2018. While patient was at her PCP today, she was found to have elevated glucose in the 400s. Patient denies abdominal pain, headache, numbness/tingling, diarrhea, and changes to vision.   Past Medical History:  Diagnosis Date   Anemia    Iron deficiency   Aneurysm of artery ([place])    Blood transfusion    Bronchitis    "have had a couple of times in my lifetime"   Cancer [facility]) 1985   cervical   Cataract    CKD (chronic kidney disease) stage 1, GFR 90 ml/min or greater was told by her PCP   Intolerant to ACE according to her PCP   Diabetes mellitus without complication ([place])    Dyspnea    Dysrhythmia    :"SKIPS A BEAT "   SCHEDULED FOR STRESS TEST 2019   GERD (gastroesophageal reflux disease)    High cholesterol    History of hiatal hernia    History of kidney stones    Hyperlipidemia    Hypertension    Poliomyelitis since 1948   has drop foot on R   Stroke ([place]) 2007 affected R side   Vertigo now improved    Patient Active Problem List   Diagnosis Date Noted   GERD (gastroesophageal reflux disease) 01/15/2018   H/O  adenomatous polyp of colon 01/15/2018   Rectal bleeding 01/15/2018   Elevated serum creatinine 12/10/2017   Persistent proteinuria 12/10/2017   Polio osteopathy ([place]) 10/18/2017   GERD with esophagitis 10/18/2017   Chronic idiopathic constipation 06/12/2017   Carotid stenosis, symptomatic, with infarction ([place]) 04/10/2017   Left-sided weakness 03/11/2017   At high risk for falls 03/11/2017   Cerebrovascular accident (CVA) due to embolism of cerebral artery ([place]) 03/03/2017   Palpitation 02/19/2017   Frequent falls 02/19/2017   Appetite loss 02/19/2017   Nausea & vomiting 02/19/2017   History of post poliomyelitis muscular atrophy 06/09/2016   Well woman exam 06/09/2016   History of stroke 06/09/2016   Aneurysm of artery ([place]) 06/09/2016   Sciatica of right side 02/04/2016   Chronic right-sided low back pain with right-sided sciatica 02/04/2016   Chronic nonseasonal allergic rhinitis due to pollen 02/04/2016   COPD exacerbation ([place]) 02/04/2016   TIA (transient ischemic attack) 02/19/2011   HTN (hypertension), benign 02/19/2011   DM (diabetes mellitus) type II controlled with renal manifestation ([place]) 02/19/2011   Hypokalemia 02/19/2011   Leukocytosis 02/19/2011   UTI (lower urinary tract infection) 02/19/2011   Tobacco abuse 02/19/2011    Past Surgical History:  Procedure Laterality Date   CAROTID ENDARTERECTOMY Left 2000 L side   CERVICAL CONIZATION W/BX  1985   COLONOSCOPY  2010   Dr. Laural Golden: tubular adenoma.    COLONOSCOPY N/A 03/03/2018   Procedure: COLONOSCOPY;  Surgeon: Daneil Dolin, MD;  Location: [facility] ENDO SU[facility];  Service: Endoscopy;  Laterality: N/A;  2:00pm   CORONARY ANGIOPLASTY  stented 2007   DILATION AND CURETTAGE OF UTERUS  1980's   foot reconstructjion  1953   right foot; post polio   IR ANGIO EXTERNAL CAROTID SEL EXT CAROTID UNI R MOD SED  03/06/2017   IR ANGIO INTRA EXTRACRAN SEL COM CAROTID INNOMINATE UNI R MOD SED   03/24/2017   IR ANGIO VERTEBRAL SEL SUBCLAVIAN INNOMINATE BILAT MOD SED  03/09/2017   IR ANGIO VERTEBRAL SEL VERTEBRAL BILAT MOD SED  03/06/2017   IR RADIOLOGIST EVAL & MGMT  07/21/2016   POLYPECTOMY  03/03/2018   Procedure: POLYPECTOMY;  Surgeon: Daneil Dolin, MD;  Location: [facility] ENDO SUITE;  Service: Endoscopy;;  colon   RADIOLOGY WITH ANESTHESIA N/A 03/24/2017   Procedure: STENTING;  Surgeon: Luanne Bras, MD;  Location: [place];  Service: Radiology;  Laterality: N/A;   TONSILLECTOMY  1954     OB History   No obstetric history on file.      Home Medications    Prior to Admission medications   Medication Sig Start Date End Date Taking? Authorizing Provider  albuterol (PROVENTIL HFA;VENTOLIN HFA) 108 (90 Base) MCG/ACT inhaler Inhale 2 puffs into the lungs every 6 (six) hours as needed for wheezing or shortness of breath. Patient not taking: Reported on 02/07/2019 06/12/17   Terald Sleeper, PA-C  amLODipine (NORVASC) 10 MG tablet Take 1 tablet by mouth once daily 08/24/18   Terald Sleeper, PA-C  aspirin EC 325 MG EC tablet Take 1 tablet (325 mg total) by mouth daily. 03/09/17   Rinehuls, Early Chars, PA-C  blood glucose meter kit and supplies Dispense based on patient and insurance preference.Check Blood sugar three times a day. DX E11.65 02/05/2019   Terald Sleeper, PA-C  carvedilol (COREG) 12.5 MG tablet TAKE 1 TABLET BY MOUTH TWICE DAILY WITH MEALS 09/08/18   Terald Sleeper, PA-C  clopidogrel (PLAVIX) 75 MG tablet Take 1 tablet (75 mg total) by mouth daily. 09/08/18   Terald Sleeper, PA-C  clotrimazole-betamethasone (LOTRISONE) lotion Apply topically 2 (two) times daily. 07/28/18   Terald Sleeper, PA-C  doxazosin (CARDURA) 2 MG tablet Take 2 mg by mouth every evening.  01/06/18   [provider]  furosemide (LASIX) 20 MG tablet Take 1-2 tablets (20-40 mg total) by mouth daily. Patient taking differently: Take 20-40 mg by mouth See admin instructions. Take 1 tablet (20 mg) by  mouth alternating with 2 tablets (40 mg) by mouth every other day cyclically. 01/07/18   Terald Sleeper, PA-C  hydrocortisone (ANUSOL-HC) 2.5 % rectal cream Place 1 application rectally 2 (two) times daily as needed for hemorrhoids. 07/28/18   Terald Sleeper, PA-C  linaclotide [facility]) 145 MCG CAPS capsule Take 1 capsule (145 mcg total) by mouth daily before breakfast. Patient taking differently: Take 145 mcg by mouth daily as needed (for constipation.).  06/12/17   Terald Sleeper, PA-C  losartan (COZAAR) 50 MG tablet Take 50 mg by mouth daily. 01/24/19   [provider]  mupirocin cream (BACTROBAN) 2 % Apply 1 application topically 2 (two) times daily. Patient taking differently: Apply 1 application topically 2 (two) times daily as needed (irritation.).  06/09/16   Terald Sleeper, PA-C  pantoprazole (PROTONIX) 20 MG  tablet Take 1 tablet (20 mg total) by mouth daily. (Needs to be seen before next refill) 07/28/18   Terald Sleeper, PA-C  polyethylene glycol-electrolytes (TRILYTE) 420 g solution Take 4,000 mLs by mouth as directed. 01/15/18   Rourk, Cristopher Estimable, MD  pravastatin (PRAVACHOL) 20 MG tablet Take 1 tablet (20 mg total) by mouth daily. 07/26/18   Terald Sleeper, PA-C  RAYALDEE 30 MCG CPCR Take 30 mcg by mouth at bedtime.    [provider]  sitaGLIPtin (JANUVIA) 50 MG tablet Take 1 tablet (50 mg total) by mouth daily. 02/07/2019   Terald Sleeper, PA-C    Family History Family History  Problem Relation Age of Onset   Coronary artery disease Mother    Heart disease Mother    Stroke Mother    Diabetes type II Father    Cancer Father        brain stem   Brain cancer Father    Diabetes Father    Cancer Sister        breast with recurrance   Diabetes Sister    Stroke Sister    Breast cancer Sister    Coronary artery disease Brother    Healthy Daughter    COPD Maternal Grandmother        breast   Breast cancer Maternal Grandmother    COPD Paternal Grandmother         breast   Breast cancer Paternal Grandmother    Obesity Sister    Heart disease Brother    Heart attack Brother    Lung cancer Brother    COPD Other    Breast cancer Other    Stroke Paternal Grandfather    Drug abuse Son    Alcohol abuse Son    Colon cancer Other        passed from colon cancer    Social History Social History   Tobacco Use   Smoking status: Former Smoker    Packs/day: 0.25    Years: 50.00    Pack years: 12.50    Types: Cigarettes    Quit date: 03/03/2017    Years since quitting: 1.9   Smokeless tobacco: Never Used  Substance Use Topics   Alcohol use: No   Drug use: No     Allergies   Penicillins   Review of Systems Review of Systems  Constitutional: Negative for chills and fever.  Respiratory: Positive for shortness of breath.   Cardiovascular: Positive for chest pain. Negative for palpitations and leg swelling.  Gastrointestinal: Positive for nausea and vomiting. Negative for abdominal pain and diarrhea.  Neurological: Negative for weakness, numbness and headaches.  All other systems reviewed and are negative.    Physical Exam Updated Vital Signs BP (!) 126/59 (BP Location: Right Arm)    Pulse 82    Temp 98.3 F (36.8 C) (Oral)    Resp 20    Ht '5\' 2"'$  (1.575 m)    Wt 81.6 kg    SpO2 96%    BMI 32.92 kg/m   Physical Exam Constitutional:      General: She is not in acute distress.    Appearance: She is ill-appearing. She is not diaphoretic.     Comments: Pallor and clenching central chest  HENT:     Head: Normocephalic.  Eyes:     General: No scleral icterus.    Pupils: Pupils are equal, round, and reactive to light.  Neck:     Musculoskeletal: Normal range of  motion and neck supple.  Cardiovascular:     Rate and Rhythm: Normal rate and regular rhythm.     Pulses: Normal pulses.     Heart sounds: Normal heart sounds. No murmur. No friction rub. No gallop.      Comments: Mild TTP on central chest  wall Pulmonary:     Effort: Pulmonary effort is normal.     Breath sounds: Normal breath sounds.     Comments: Clear to ascultation bilaterally Abdominal:     General: Abdomen is flat. Bowel sounds are normal. There is no distension.     Palpations: Abdomen is soft.     Tenderness: There is no abdominal tenderness.  Musculoskeletal: Normal range of motion.  Skin:    General: Skin is warm.     Capillary Refill: Capillary refill takes less than 2 seconds.  Neurological:     General: No focal deficit present.      ED Treatments / Results  Labs (all labs ordered are listed, but only abnormal results are displayed) Labs Reviewed  CBC WITH DIFFERENTIAL/PLATELET  COMPREHENSIVE METABOLIC PANEL  PROTIME-INR  TROPONIN I (HIGH SENSITIVITY)    EKG EKG Interpretation  Date/Time:  Monday January 31 2019 13:09:32 EDT Ventricular Rate:  74 PR Interval:    QRS Duration: 88 QT Interval:  327 QTC Calculation: 363 R Axis:   15 Text Interpretation:  Sinus rhythm Abnormal R-wave progression, early transition Repol abnrm, severe global ischemia (LM/MVD) Baseline wander in lead(s) I aVR ST depression anterior leads of concer and not seen on prior Confirmed by Noemi Chapel [phone]) on 01/18/2019 1:17:17 PM   Radiology No results found.  Procedures .Critical Care Performed by: Jonette Eva, PA-C Authorized by: Jonette Eva, PA-C   Critical care provider statement:    Critical care time (minutes):  30   Critical care time was exclusive of:  Separately billable procedures and treating other patients and teaching time   Critical care was necessary to treat or prevent imminent or life-threatening deterioration of the following conditions:  Cardiac failure   Critical care was time spent personally by me on the following activities:  Evaluation of patient's response to treatment, examination of patient, ordering and performing treatments and interventions, ordering and review of  laboratory studies, ordering and review of radiographic studies, pulse oximetry, re-evaluation of patient's condition, obtaining history from patient or surrogate and review of old charts   (including critical care time)  Medications Ordered in ED Medications  ondansetron (ZOFRAN) injection 4 mg (has no administration in time range)  aspirin chewable tablet 81 mg (has no administration in time range)  nitroGLYCERIN 50 mg in dextrose 5 % 250 mL (0.2 mg/mL) infusion (has no administration in time range)     Initial Impression / Assessment and Plan / ED Course  I have reviewed the triage vital signs and the nursing notes.  Pertinent labs & imaging results that were available during my care of the patient were reviewed by me and considered in my medical decision making (see chart for details).       Paula Watson is a 73 year old female who presents to the ED via EMS from her PCP due to severe, central chest pain x 2 weeks. At her PCP she was also found to have elevated glucose levels in the 400s. Upon arrival, vitals within normal limits except slight elevation in BP at 126/59. On physical exam, patient appears uncomfortable, pale, and is clenching chest. She is actively dry heaving.  Heart sounds normal. Lungs clear to auscultation bilaterally. Will order INR, CMP, Troponin, CBC, EKG, and CXR. High suspicion for ACS. Will give patient ASA and start nitroglycerin drip.   EKG demonstrated ST depression in numerous leads. When compared to previous EKGs, some changes present. High suspicion for posterior myocardial infarct. Dr. Sabra Heck consulted Dr. Harl Bowie of the cardiology service to admit patient who recommends starting Heparin and agrees with the plan to start the nitroglycerin drip.  Final Clinical Impressions(s) / ED Diagnoses   Final diagnoses:  None    ED Discharge Orders    None       Jonette Eva, PA-C 02/06/2019 1547    Noemi Chapel, MD 02/01/19 [phone]

## 2019-01-31 NOTE — ED Notes (Signed)
Critical troponin reported to Dr. Meda Coffee with cardiology at [facility]. No new orders.

## 2019-01-31 NOTE — ED Notes (Signed)
Message given to [facility] about new orders from Cardiology from [facility].

## 2019-01-31 NOTE — Progress Notes (Signed)
Brief progress note:  [name] a61 yo female with a history of CVA s/p Right ICA stenting, chronic Left CCA occlusion, prior subclavian to carotid bypass, DM2, and CKD who is admitted to the hospital with chest pain.  The high-sensitivity troponins were 80 and 546.  The hemoglobin A1c was 10.5.  The ECG showed diffuse ST depressions and ST elevation in aVR.  She was therefore transferred from [facility] to [facility] for consideration for a cardiac catheterization procedure in the morning.  On arrival to the unit the patient complained of chest pain radiating to the shoulders along with some chest tightness.  Her IV nitroglycerin had been temporarily discontinued during her transfer from the other hospital.  VS: 116/49 mmHg, heart rate 70 bpm Cardiac auscultation: normal Twelve-lead ECG: Diffuse ST depressions unchanged from prior tracings  -IV nitroglycerin infusion restarted and titrated to chest pain -IV heparin continued -2 mg IV morphine x1 -1 mg IV Ativan x1  The patient felt appreciably better after the IV nitroglycerin infusion was resumed.  Melina Schools, MD, [facility] Overnight coverage team

## 2019-01-31 NOTE — ED Notes (Signed)
Dr Harl Bowie called to notify of critical Troponin 546

## 2019-01-31 NOTE — Progress Notes (Signed)
ANTICOAGULATION CONSULT NOTE - Initial Consult  Pharmacy Consult for Heparin Indication: chest pain/ACS  Allergies  Allergen Reactions  . Penicillins Hives and Swelling    PATIENT HAS HAD A PCN REACTION WITH IMMEDIATE RASH, FACIAL/TONGUE/THROAT SWELLING, SOB, OR LIGHTHEADEDNESS WITH HYPOTENSION:  #  #  #  YES  #  #  #   Has patient had a PCN reaction causing severe rash involving mucus membranes or skin necrosis: Unknown Has patient had a PCN reaction that required hospitalization: No Has patient had a PCN reaction occurring within the last 10 years: No     Patient Measurements: Height: 5\' 2"  (157.5 cm) Weight: 180 lb (81.6 kg) IBW/kg (Calculated) : 50.1 HEPARIN DW (KG): 68.3  Vital Signs: Temp: 98.3 F (36.8 C) (10/19 1310) Temp Source: Oral (10/19 1310) BP: 126/59 (10/19 1310) Pulse Rate: 82 (10/19 1315)  Labs: No results for input(s): HGB, HCT, PLT, APTT, LABPROT, INR, HEPARINUNFRC, HEPRLOWMOCWT, CREATININE, CKTOTAL, CKMB, TROPONINIHS in the last 72 hours.  CrCl cannot be calculated (Patient's most recent lab result is older than the maximum 21 days allowed.).   Medical History: Past Medical History:  Diagnosis Date  . Anemia    Iron deficiency  . Aneurysm of artery ([place])   . Blood transfusion   . Bronchitis    "have had a couple of times in my lifetime"  . Cancer ([place]) 1985   cervical  . Cataract   . CKD (chronic kidney disease) stage 1, GFR 90 ml/min or greater was told by her PCP   Intolerant to ACE according to her PCP  . Diabetes mellitus without complication ([place])   . Dyspnea   . Dysrhythmia    :"SKIPS A BEAT "   SCHEDULED FOR STRESS TEST 2019  . GERD (gastroesophageal reflux disease)   . High cholesterol   . History of hiatal hernia   . History of kidney stones   . Hyperlipidemia   . Hypertension   . Poliomyelitis since 1948   has drop foot on R  . Stroke [facility]) 2007 affected R side  . Vertigo now improved    Medications:  See med  rec  Assessment: Patient presented to ED with chest Pain. Reviewed medications and not currently on any oral anticoagulants/DOACs. Pharmacy asked to start heparin.  Goal of Therapy:  Heparin level 0.3-0.7 units/ml Monitor platelets by anticoagulation protocol: Yes   Plan:  Give 4000 units bolus x 1 Start heparin infusion at 850 units/hr Check anti-Xa level in ~8 hours hours and daily while on heparin Continue to monitor H&H and platelets  Isac Sarna, BS Vena Austria, BCPS Clinical Pharmacist Pager [phone] 02/03/2019,1:32 PM

## 2019-01-31 NOTE — Progress Notes (Signed)
Paged Cards to let them know pt has arrived

## 2019-01-31 NOTE — Progress Notes (Signed)
ANTICOAGULATION CONSULT NOTE - Follow Up Consult  Pharmacy Consult for Heparin Indication: chest pain/ACS  Allergies  Allergen Reactions  . Penicillins Hives and Swelling    PATIENT HAS HAD A PCN REACTION WITH IMMEDIATE RASH, FACIAL/TONGUE/THROAT SWELLING, SOB, OR LIGHTHEADEDNESS WITH HYPOTENSION:  #  #  #  YES  #  #  #   Has patient had a PCN reaction causing severe rash involving mucus membranes or skin necrosis: Unknown Has patient had a PCN reaction that required hospitalization: No Has patient had a PCN reaction occurring within the last 10 years: No     Patient Measurements: Height: 5\' 2"  (157.5 cm) Weight: 179 lb 14.3 oz (81.6 kg) IBW/kg (Calculated) : 50.1 Heparin Dosing Weight: 68.3  Vital Signs: Temp: 98.1 F (36.7 C) (10/19 2013) Temp Source: Oral (10/19 2013) BP: 87/62 (10/19 2013) Pulse Rate: 64 (10/19 1830)  Labs: Recent Labs    02/09/2019 1028 01/13/2019 1402 01/27/2019 1607 02/04/2019 2129  HGB 11.0* 11.2*  --   --   HCT 32.9* 35.9*  --   --   PLT 287 263  --   --   LABPROT  --  13.4  --   --   INR  --  1.0  --   --   HEPARINUNFRC  --   --   --  0.82*  CREATININE WILL FOLLOW 2.40*  --   --   TROPONINIHS  --  80* 546*  --     Estimated Creatinine Clearance: 20.7 mL/min (A) (by C-G formula based on SCr of 2.4 mg/dL (H)).  Assessment: 73 yo W admitted for chest pain, which is still ongoing 10hr after admission. No anticoagulants at home but should be on aspirin (med history pending).   First heparin level drawn 7.5hr after bolus and infusion start is supratherapeutic, may still be affected by bolus dose. No bleeding per RN. Will decrease infusion rate.   Goal of Therapy:  Heparin level 0.3-0.7 units/ml Monitor platelets by anticoagulation protocol: Yes   Plan:  -Decrease heparin to 700 unit/hr  -Anti-Xa with AM labs  -Monitor CBC/platelets daily   Benetta Spar, PharmD, BCPS, [facility] Clinical Pharmacist

## 2019-02-01 ENCOUNTER — Ambulatory Visit: Payer: Medicare Other | Admitting: Gastroenterology

## 2019-02-01 ENCOUNTER — Inpatient Hospital Stay (HOSPITAL_COMMUNITY): Payer: Medicare Other

## 2019-02-01 DIAGNOSIS — I214 Non-ST elevation (NSTEMI) myocardial infarction: Secondary | ICD-10-CM

## 2019-02-01 LAB — ECHOCARDIOGRAM COMPLETE
Height: 62 in
Weight: 2910.07 oz

## 2019-02-01 LAB — GLUCOSE, CAPILLARY
Glucose-Capillary: 136 mg/dL — ABNORMAL HIGH (ref 70–99)
Glucose-Capillary: 132 mg/dL — ABNORMAL HIGH (ref 70–99)
Glucose-Capillary: 232 mg/dL — ABNORMAL HIGH (ref 70–99)
Glucose-Capillary: 296 mg/dL — ABNORMAL HIGH (ref 70–99)

## 2019-02-01 LAB — MICROALBUMIN / CREATININE URINE RATIO
Creatinine, Urine: 111.2 mg/dL
Microalb/Creat Ratio: 846 mg/g creat — ABNORMAL HIGH (ref 0–29)
Microalbumin, Urine: 941.3 ug/mL

## 2019-02-01 LAB — CBC WITH DIFFERENTIAL/PLATELET
Basophils Absolute: 0.1 10*3/uL (ref 0.0–0.2)
Basos: 1 %
EOS (ABSOLUTE): 0.3 10*3/uL (ref 0.0–0.4)
Eos: 3 %
Hematocrit: 32.9 % — ABNORMAL LOW (ref 34.0–46.6)
Hemoglobin: 11 g/dL — ABNORMAL LOW (ref 11.1–15.9)
Immature Grans (Abs): 0 10*3/uL (ref 0.0–0.1)
Immature Granulocytes: 0 %
Lymphocytes Absolute: 2.2 10*3/uL (ref 0.7–3.1)
Lymphs: 21 %
MCH: 28.6 pg (ref 26.6–33.0)
MCHC: 33.4 g/dL (ref 31.5–35.7)
MCV: 86 fL (ref 79–97)
Monocytes Absolute: 0.7 10*3/uL (ref 0.1–0.9)
Monocytes: 6 %
Neutrophils Absolute: 7 10*3/uL (ref 1.4–7.0)
Neutrophils: 69 %
Platelets: 287 10*3/uL (ref 150–450)
RBC: 3.84 x10E6/uL (ref 3.77–5.28)
RDW: 12.4 % (ref 11.7–15.4)
WBC: 10.2 10*3/uL (ref 3.4–10.8)

## 2019-02-01 LAB — BASIC METABOLIC PANEL
Anion gap: 11 (ref 5–15)
BUN: 30 mg/dL — ABNORMAL HIGH (ref 8–23)
CO2: 19 mmol/L — ABNORMAL LOW (ref 22–32)
Calcium: 8.9 mg/dL (ref 8.9–10.3)
Chloride: 108 mmol/L (ref 98–111)
Creatinine, Ser: 2.81 mg/dL — ABNORMAL HIGH (ref 0.44–1.00)
GFR calc Af Amer: 19 mL/min — ABNORMAL LOW (ref >60)
GFR calc non Af Amer: 16 mL/min — ABNORMAL LOW (ref >60)
Glucose, Bld: 196 mg/dL — ABNORMAL HIGH (ref 70–99)
Potassium: 3.6 mmol/L (ref 3.5–5.1)
Sodium: 138 mmol/L (ref 135–145)

## 2019-02-01 LAB — T4, FREE: Free T4: 1.37 ng/dL — ABNORMAL HIGH (ref 0.61–1.12)

## 2019-02-01 LAB — CMP14+EGFR
ALT: 10 IU/L (ref 0–32)
AST: 11 IU/L (ref 0–40)
Albumin/Globulin Ratio: 1.6 (ref 1.2–2.2)
Albumin: 3.7 g/dL (ref 3.7–4.7)
Alkaline Phosphatase: 109 IU/L (ref 39–117)
BUN/Creatinine Ratio: 12 (ref 12–28)
BUN: 29 mg/dL — ABNORMAL HIGH (ref 8–27)
Bilirubin Total: <0.2 mg/dL (ref 0.0–1.2)
CO2: 20 mmol/L (ref 20–29)
Calcium: 10.3 mg/dL (ref 8.7–10.3)
Chloride: 104 mmol/L (ref 96–106)
Creatinine, Ser: 2.51 mg/dL — ABNORMAL HIGH (ref 0.57–1.00)
GFR calc Af Amer: 21 mL/min/{1.73_m2} — ABNORMAL LOW (ref >59)
GFR calc non Af Amer: 18 mL/min/{1.73_m2} — ABNORMAL LOW (ref >59)
Globulin, Total: 2.3 g/dL (ref 1.5–4.5)
Glucose: 274 mg/dL — ABNORMAL HIGH (ref 65–99)
Potassium: 3.8 mmol/L (ref 3.5–5.2)
Sodium: 139 mmol/L (ref 134–144)
Total Protein: 6 g/dL (ref 6.0–8.5)

## 2019-02-01 LAB — LIPID PANEL
Chol/HDL Ratio: 3.3 ratio (ref 0.0–4.4)
Cholesterol, Total: 147 mg/dL (ref 100–199)
Cholesterol: 127 mg/dL (ref 0–200)
HDL: 44 mg/dL (ref >39)
HDL: 35 mg/dL — ABNORMAL LOW (ref >40)
LDL Chol Calc (NIH): 82 mg/dL (ref 0–99)
LDL Cholesterol: 74 mg/dL (ref 0–99)
Total CHOL/HDL Ratio: 3.6 RATIO
Triglycerides: 116 mg/dL (ref 0–149)
Triglycerides: 91 mg/dL (ref <150)
VLDL Cholesterol Cal: 21 mg/dL (ref 5–40)
VLDL: 18 mg/dL (ref 0–40)

## 2019-02-01 LAB — URINALYSIS, ROUTINE W REFLEX MICROSCOPIC
Bilirubin Urine: NEGATIVE
Glucose, UA: 150 mg/dL — AB
Hgb urine dipstick: NEGATIVE
Ketones, ur: NEGATIVE mg/dL
Leukocytes,Ua: NEGATIVE
Nitrite: NEGATIVE
Protein, ur: 100 mg/dL — AB
Specific Gravity, Urine: 1.016 (ref 1.005–1.030)
pH: 5 (ref 5.0–8.0)

## 2019-02-01 LAB — CBC
HCT: 29.9 % — ABNORMAL LOW (ref 36.0–46.0)
Hemoglobin: 9.6 g/dL — ABNORMAL LOW (ref 12.0–15.0)
MCH: 28.2 pg (ref 26.0–34.0)
MCHC: 32.1 g/dL (ref 30.0–36.0)
MCV: 87.7 fL (ref 80.0–100.0)
Platelets: 258 10*3/uL (ref 150–400)
RBC: 3.41 MIL/uL — ABNORMAL LOW (ref 3.87–5.11)
RDW: 13 % (ref 11.5–15.5)
WBC: 10.2 10*3/uL (ref 4.0–10.5)
nRBC: 0 % (ref 0.0–0.2)

## 2019-02-01 LAB — TROPONIN I (HIGH SENSITIVITY)
Troponin I (High Sensitivity): 3266 ng/L (ref <18)
Troponin I (High Sensitivity): 2263 ng/L (ref <18)

## 2019-02-01 LAB — MRSA PCR SCREENING: MRSA by PCR: NEGATIVE

## 2019-02-01 LAB — HEPARIN LEVEL (UNFRACTIONATED): Heparin Unfractionated: 0.44 IU/mL (ref 0.30–0.70)

## 2019-02-01 LAB — TSH: TSH: 6.927 u[IU]/mL — ABNORMAL HIGH (ref 0.350–4.500)

## 2019-02-01 MED ORDER — MORPHINE SULFATE (PF) 2 MG/ML IV SOLN
2.0000 mg | Freq: Once | INTRAVENOUS | Status: AC
  Administered 2019-02-01: 21:00:00 2 mg via INTRAVENOUS

## 2019-02-01 MED ORDER — SODIUM CHLORIDE 0.9 % IV SOLN: INTRAVENOUS | Status: DC

## 2019-02-01 MED ORDER — ASPIRIN 81 MG PO CHEW
81.0000 mg | CHEWABLE_TABLET | Freq: Once | ORAL | Status: AC
  Administered 2019-02-01: 81 mg via ORAL
  Filled 2019-02-01: qty 1

## 2019-02-01 MED ORDER — ASPIRIN EC 81 MG PO TBEC
81.0000 mg | DELAYED_RELEASE_TABLET | Freq: Every day | ORAL | Status: DC
  Administered 2019-02-02 – 2019-02-03 (×2): 81 mg via ORAL
  Filled 2019-02-01 (×2): qty 1

## 2019-02-01 MED ORDER — MORPHINE SULFATE (PF) 2 MG/ML IV SOLN
2.0000 mg | Freq: Once | INTRAVENOUS | Status: AC
  Administered 2019-02-01: 2 mg via INTRAVENOUS

## 2019-02-01 MED ORDER — LORAZEPAM 2 MG/ML IJ SOLN
1.0000 mg | Freq: Once | INTRAMUSCULAR | Status: AC
  Administered 2019-02-01: 22:00:00 1 mg via INTRAVENOUS

## 2019-02-01 MED ORDER — MORPHINE SULFATE (PF) 2 MG/ML IV SOLN
INTRAVENOUS | Status: AC
  Filled 2019-02-01: qty 1

## 2019-02-01 MED ORDER — MORPHINE SULFATE (PF) 2 MG/ML IV SOLN
INTRAVENOUS | Status: AC
  Administered 2019-02-01: 2 mg via INTRAVENOUS
  Filled 2019-02-01: qty 1

## 2019-02-01 MED ORDER — LORAZEPAM 2 MG/ML IJ SOLN
INTRAMUSCULAR | Status: AC
  Administered 2019-02-01: 1 mg via INTRAVENOUS
  Filled 2019-02-01: qty 1

## 2019-02-01 NOTE — Progress Notes (Signed)
Patient c/o 10 out 10 chest pain with associated nausea.  Nitro gtt increased x2 with no relief.  12 Lead EKG obtained.  MD notified, orders for 2mg  IV morphine and 1 mg IV ativan obtained.  See MAR for administration.  Patient had almost immediate relief, falling asleep.  Will continue to monitor.

## 2019-02-01 NOTE — Progress Notes (Signed)
ANTICOAGULATION CONSULT NOTE - Follow Up Consult  Pharmacy Consult for Heparin Indication: chest pain/ACS  Allergies  Allergen Reactions  . Penicillins Hives and Swelling    PATIENT HAS HAD A PCN REACTION WITH IMMEDIATE RASH, FACIAL/TONGUE/THROAT SWELLING, SOB, OR LIGHTHEADEDNESS WITH HYPOTENSION:  #  #  #  YES  #  #  #   Has patient had a PCN reaction causing severe rash involving mucus membranes or skin necrosis: Unknown Has patient had a PCN reaction that required hospitalization: No Has patient had a PCN reaction occurring within the last 10 years: No     Patient Measurements: Height: 5\' 2"  (157.5 cm) Weight: 181 lb 14.1 oz (82.5 kg) IBW/kg (Calculated) : 50.1 Heparin Dosing Weight: 68.3  Vital Signs: Temp: 97.7 F (36.5 C) (10/20 0739) Temp Source: Oral (10/20 0739) BP: 91/58 (10/20 0739) Pulse Rate: 54 (10/20 0739)  Labs: Recent Labs    02/01/2019 1028 01/25/2019 1402 01/26/2019 1607 01/18/2019 2129 02/01/19 0249 02/01/19 0259  HGB 11.0* 11.2*  --   --   --  9.6*  HCT 32.9* 35.9*  --   --   --  29.9*  PLT 287 263  --   --   --  258  LABPROT  --  13.4  --   --   --   --   INR  --  1.0  --   --   --   --   HEPARINUNFRC  --   --   --  0.82*  --  0.44  CREATININE 2.51* 2.40*  --   --  2.81*  --   TROPONINIHS  --  80* 546*  --   --  3,266*    Estimated Creatinine Clearance: 17.8 mL/min (A) (by C-G formula based on SCr of 2.81 mg/dL (H)).  Assessment: 73 yo W admitted for chest pain, which is still ongoing 10hr after admission. No anticoagulants at home but should be on aspirin (med history pending).   Heparin level this am is at goal (0.4), hgb down 11>9.6. No bleeding issues noted overnight. Possible cath today.   Goal of Therapy:  Heparin level 0.3-0.7 units/ml Monitor platelets by anticoagulation protocol: Yes   Plan:  -Continue heparin at 700 unit/hr  -Anti-Xa with AM labs  -Monitor CBC/platelets daily   Erin Hearing PharmD., BCPS Clinical  Pharmacist 02/01/2019 8:48 AM

## 2019-02-01 NOTE — Progress Notes (Signed)
  Echocardiogram 2D Echocardiogram has been performed.  Matilde Bash 02/01/2019, 8:58 AM

## 2019-02-01 NOTE — Progress Notes (Signed)
Patient with sudden onset chest pain 10/10 that feels like pressure that radiates to her shoulder. Nitroglycerin gtt increased, O2 increased. EKG done. Harrington Challenger MD as well as rapid response RN notified. Verbal orders from [place] MD to give patient 2 mg morphine and to start NS at 60 mL/hr. Chest pain has resolved mostly, but patient still having some pressure.

## 2019-02-01 NOTE — Progress Notes (Addendum)
Progress Note  Patient Name: Paula Watson Date of Encounter: 02/01/2019  Primary Cardiologist:  Kate Sable, MD  Subjective   No chest pain or SOB now.  Inpatient Medications    Scheduled Meds:  amLODipine  10 mg Oral Daily   aspirin  325 mg Oral Daily   atorvastatin  40 mg Oral q1800   Calcifediol ER  30 mcg Oral QHS   carvedilol  12.5 mg Oral BID WC   clopidogrel  75 mg Oral Daily   doxazosin  2 mg Oral QPM   insulin aspart  0-5 Units Subcutaneous QHS   insulin aspart  0-9 Units Subcutaneous TID WC   morphine       pantoprazole  20 mg Oral Daily   Continuous Infusions:  heparin 700 Units/hr (02/01/19 0600)   nitroGLYCERIN 15 mcg/min (02/01/19 0600)   PRN Meds: acetaminophen, albuterol, linaclotide, nitroGLYCERIN, ondansetron (ZOFRAN) IV   Vital Signs    Vitals:   02/01/19 0452 02/01/19 0500 02/01/19 0638 02/01/19 0739  BP:  (!) 84/46  (!) 91/58  Pulse:    (!) 54  Resp:  12  13  Temp: 97.6 F (36.4 C)   97.7 F (36.5 C)  TempSrc: Oral   Oral  SpO2:  97%  98%  Weight:   82.5 kg   Height:        Intake/Output Summary (Last 24 hours) at 02/01/2019 8756 Last data filed at 02/01/2019 0600 Gross per 24 hour  Intake 224.36 ml  Output --  Net 224.36 ml   Filed Weights   01/19/2019 1302 02/03/2019 2013 02/01/19 0638  Weight: 81.6 kg 81.6 kg 82.5 kg   Last Weight  Most recent update: 02/01/2019  6:39 AM   Weight  82.5 kg (181 lb 14.1 oz)           Weight change:    Telemetry    SR, Sinus brady - Personally Reviewed  ECG    SR, HR 65, Lateral T wave inversions are slightly different from 2018, but similar to presentation,  - Personally Reviewed  Physical Exam   General: Well developed, well nourished, female appearing in no acute distress. Head: Normocephalic, atraumatic.  Neck: Supple without bruits, JVD not elevated. Lungs:  Resp regular and unlabored, CTA. Heart: RRR, S1, S2, no S3, S4, or murmur; no rub. Abdomen: Soft,  non-tender, non-distended with normoactive bowel sounds. No hepatomegaly. No rebound/guarding. No obvious abdominal masses. Extremities: No clubbing, cyanosis, no edema. Distal pedal pulses are 2+ bilaterally. Neuro: Alert and oriented X 3. Moves all extremities spontaneously. Psych: Normal affect.  Labs    Hematology Recent Labs  Lab 01/16/2019 1028 02/11/2019 1402 02/01/19 0259  WBC 10.2 10.8* 10.2  RBC 3.84 4.06 3.41*  HGB 11.0* 11.2* 9.6*  HCT 32.9* 35.9* 29.9*  MCV 86 88.4 87.7  MCH 28.6 27.6 28.2  MCHC 33.4 31.2 32.1  RDW 12.4 13.1 13.0  PLT 287 263 258    Chemistry Recent Labs  Lab 01/21/2019 1028 01/16/2019 1402 02/01/19 0249  NA 139 138 138  K 3.8 3.7 3.6  CL 104 106 108  CO2 20 21* 19*  GLUCOSE 274* 371* 196*  BUN 29* 31* 30*  CREATININE 2.51* 2.40* 2.81*  CALCIUM 10.3 9.6 8.9  PROT 6.0 6.5  --   ALBUMIN 3.7 3.3*  --   AST 11 15  --   ALT 10 13  --   ALKPHOS 109 82  --   BILITOT <0.2 0.5  --  GFRNONAA 18* 19* 16*  GFRAA 21* 22* 19*  ANIONGAP  --  11 11     High Sensitivity Troponin:   Recent Labs  Lab 01/19/2019 1402 02/06/2019 1607 02/01/19 0259  TROPONINIHS 80* 546* 3,266*     Lab Results  Component Value Date   CHOL 127 02/01/2019   HDL 35 (L) 02/01/2019   LDLCALC 74 02/01/2019   TRIG 91 02/01/2019   CHOLHDL 3.6 02/01/2019   Lab Results  Component Value Date   TSH 6.927 (H) 02/01/2019   Lab Results  Component Value Date   HGBA1C 10.5 (H) 02/09/2019    Radiology    Dg Chest Port 1 View  Result Date: 01/14/2019 CLINICAL DATA:  Chest pain.  Hypertension. EXAM: PORTABLE CHEST 1 VIEW COMPARISON:  February 19, 2011 FINDINGS: There is no edema or consolidation. Heart size and pulmonary vascularity are normal. No adenopathy. There is aortic atherosclerosis. There are surgical clips in the left cervicothoracic junction region. IMPRESSION: No edema or consolidation. Cardiac silhouette within normal limits. Aortic Atherosclerosis (ICD10-I70.0).  Electronically Signed   By: Lowella Grip III M.D.   On: 01/24/2019 14:39     Cardiac Studies   ECHO:  Ordered (EF nl 2018)  Patient Profile     73 y.o. female w/ hx CVA s/p Right ICA stenting, chronic Left CCA occlusion, prior subclavian to carotid bypass, DM2, and CKD III was admitted from [facility] on 10/19 for NSTEMI.    Assessment & Plan    1. NSTEMI - HS trop 80>>546>>3266 - w/ elevated Cr, continue medical rx for now  - she is on ASA, Plavix, Lipitor 40 mg, IV Nitro and heparin  - f/u on echo - MD advise on diagnostic cath only  2. CKD III-IV - pt has CKD w/ BUN/Cr 29/2.51 on admit - since then, trending up and BUN/Cr  31/2.40>>30/2.81 - 11/2017 BUN/Cr 16/1.67 - pt has seen Dr Posey Pronto in the past, but not in the last year, records pending -Prior to admission, she was on losartan 50 mg daily, that has been discontinued  3. DM - A1c is elevated, was 6.3 in 2019 - encourage diet and med compliance, f/u with PCP  4. abnl TSH - ck free T4 - f/u with PCP  5. Dyslipidemia - pta on Pravachol 20 mg qd - now on Lipitor 40 mg qd - HDL 35 & LDL 74   Active Problems:   NSTEMI (non-ST elevated myocardial infarction) ([place])   Signed, Rosaria Ferries , PA-C 8:21 AM 02/01/2019 Pager: [phone]  Pt seen and examined   I agree with findings as noted by R Barrett above PT currently with minimal chest tightness   No Pain On exam; Lungs CTA    Cardiac RRR  No S3  No murmurs Abd  Mild RUQ tenderness  Ext  No edema  I have reviewed echo   Images are not optimal    LVEF is overall low normal  Inferior wall may be hypokinetic BUT this is a difficutl call   Optimally should have L heart cath to define WIth renal dysfunction would like to optimize this before doing    WIll recomm contacting renal service   She had been followedin [place] (last by Dr Hinda Lenis) Trial of hydration for now.  Dorris Carnes MD

## 2019-02-01 NOTE — Consult Note (Signed)
[facility] Renal Consultation Note  Requesting MD: Branch Indication for Consultation: A on CRF   HPI:  Paula Watson is a 73 y.o. female with past medical history significant for DM, CVdz- s/p CVA and stenting to right internal carotid.  She also has CKD- followed by Posey Pronto at [facility] but last seen there in May of 2019- seems that baseline crt is in the mid to high 1's.  There is a crt in the system from January of 2020 that was 2.24 associated with a visit from Dr. Seward Grater his note patient has nephrotic syndrome and CKD is felt secondary to diabetes.  She was on an ARB as an outpatient.  Patient presented to medical attention late yesterday afternoon for chest pain of 2 weeks duration- troponin was high and climbing.  Cardiology has been involved-attempting to medically manage.  When I come to see her today she is acutely having chest pressure and dry heaving-she is on a nitroglycerin drip but blood pressures currently too low to titrate it further.  She has had intermittent blood pressures in the 80s since she has been here on a nitro drip.  Urine output is not recorded but patient reports she has been making some.  Creatinine at presentation was 2.5 and is 2.8 today-potassium 3.6 and bicarb of 19.  Hemoglobin initially 11 down to 9.6 this morning.  ARB has been held since admission  Creatinine, Ser  Date/Time Value Ref Range Status  02/01/2019 02:49 AM 2.81 (H) 0.44 - 1.00 mg/dL Final  01/22/2019 02:02 PM 2.40 (H) 0.44 - 1.00 mg/dL Final  02/12/2019 10:28 AM 2.51 (H) 0.57 - 1.00 mg/dL Final  12/10/2017 04:43 PM 1.67 (H) 0.57 - 1.00 mg/dL Final  10/14/2017 11:24 AM 1.89 (H) 0.57 - 1.00 mg/dL Final  09/04/2017 03:36 PM 1.80 (H) 0.44 - 1.00 mg/dL Final  06/12/2017 12:53 PM 1.44 (H) 0.57 - 1.00 mg/dL Final  04/11/2017 03:35 AM 1.40 (H) 0.44 - 1.00 mg/dL Final  04/09/2017 03:39 PM 1.31 (H) 0.44 - 1.00 mg/dL Final  03/24/2017 05:55 AM 1.50 (H) 0.44 - 1.00 mg/dL Final  03/08/2017  02:34 AM 1.41 (H) 0.44 - 1.00 mg/dL Final  03/07/2017 02:50 AM 1.26 (H) 0.44 - 1.00 mg/dL Final  03/06/2017 04:07 AM 1.35 (H) 0.44 - 1.00 mg/dL Final  03/05/2017 04:19 AM 1.20 (H) 0.44 - 1.00 mg/dL Final  03/04/2017 04:54 AM 1.25 (H) 0.44 - 1.00 mg/dL Final  03/03/2017 09:24 PM 1.30 (H) 0.44 - 1.00 mg/dL Final  03/03/2017 09:11 PM 1.35 (H) 0.44 - 1.00 mg/dL Final  02/19/2017 12:28 PM 1.48 (H) 0.57 - 1.00 mg/dL Final  07/11/2016 11:21 AM 1.41 (H) 0.44 - 1.00 mg/dL Final  05/05/2016 03:16 PM 1.29 (H) 0.57 - 1.00 mg/dL Final  03/30/2015 06:40 AM 1.37 (H) 0.44 - 1.00 mg/dL Final  03/12/2015 02:13 PM 1.40 (H) 0.44 - 1.00 mg/dL Final  09/06/2013 09:12 AM 1.50 (H) 0.50 - 1.10 mg/dL Final  07/22/2013 03:25 PM 1.39 (H) 0.50 - 1.10 mg/dL Final  06/15/2013 11:03 PM 1.20 (H) 0.50 - 1.10 mg/dL Final  02/21/2011 05:48 AM 1.25 (H) 0.50 - 1.10 mg/dL Final  02/20/2011 07:00 AM 1.15 (H) 0.50 - 1.10 mg/dL Final  02/19/2011 01:15 PM 1.09 0.50 - 1.10 mg/dL Final     PMHx:   Past Medical History:  Diagnosis Date  . Anemia    Iron deficiency  . Aneurysm of artery ([place])   . Blood transfusion   . Bronchitis    "have had  a couple of times in my lifetime"  . Cancer ([place]) 1985   cervical  . Cataract   . CKD (chronic kidney disease) stage 1, GFR 90 ml/min or greater was told by her PCP   Intolerant to ACE according to her PCP  . Diabetes mellitus without complication ([place])   . Dyspnea   . Dysrhythmia    :"SKIPS A BEAT "   SCHEDULED FOR STRESS TEST 2019  . GERD (gastroesophageal reflux disease)   . High cholesterol   . History of hiatal hernia   . History of kidney stones   . Hyperlipidemia   . Hypertension   . Poliomyelitis since 1948   has drop foot on R  . Stroke [facility]) 2007 affected R side  . Vertigo now improved    Past Surgical History:  Procedure Laterality Date  . CAROTID ENDARTERECTOMY Left 2000 L side  . CERVICAL CONIZATION W/BX  1985  . COLONOSCOPY  2010   Dr. Laural [place]: tubular  adenoma.   . COLONOSCOPY N/A 03/03/2018   Procedure: COLONOSCOPY;  Surgeon: Daneil Dolin, MD;  Location[facility];  Service: Endoscopy;  Laterality: N/A;  2:00pm  . CORONARY ANGIOPLASTY  stented 2007  . DILATION AND CURETTAGE OF UTERUS  1980's  . foot reconstructjion  1953   right foot; post polio  . IR ANGIO EXTERNAL CAROTID SEL EXT CAROTID UNI R MOD SED  03/06/2017  . IR ANGIO INTRA EXTRACRAN SEL COM CAROTID INNOMINATE UNI R MOD SED  03/24/2017  . IR ANGIO VERTEBRAL SEL SUBCLAVIAN INNOMINATE BILAT MOD SED  03/09/2017  . IR ANGIO VERTEBRAL SEL VERTEBRAL BILAT MOD SED  03/06/2017  . IR RADIOLOGIST EVAL & MGMT  07/21/2016  . POLYPECTOMY  03/03/2018   Procedure: POLYPECTOMY;  Surgeon: Daneil Dolin, MD;  Location: [facility];  Service: Endoscopy;;  colon  . RADIOLOGY WITH ANESTHESIA N/A 03/24/2017   Procedure: STENTING;  Surgeon: Luanne Bras, MD;  Location: [place];  Service: Radiology;  Laterality: N/A;  . TONSILLECTOMY  1954    Family Hx:  Family History  Problem Relation Age of Onset  . Coronary artery disease Mother   . Heart disease Mother   . Stroke Mother   . Diabetes type II Father   . Cancer Father        brain stem  . Brain cancer Father   . Diabetes Father   . Cancer Sister        breast with recurrance  . Diabetes Sister   . Stroke Sister   . Breast cancer Sister   . Coronary artery disease Brother   . Healthy Daughter   . COPD Maternal Grandmother        breast  . Breast cancer Maternal Grandmother   . COPD Paternal Grandmother        breast  . Breast cancer Paternal Grandmother   . Obesity Sister   . Heart disease Brother   . Heart attack Brother   . Lung cancer Brother   . COPD Other   . Breast cancer Other   . Stroke Paternal Grandfather   . Drug abuse Son   . Alcohol abuse Son   . Colon cancer Other        passed from colon cancer    Social History:  reports that she quit smoking about 23 months ago. Her smoking use included  cigarettes. She has a 12.50 pack-year smoking history. She has never used smokeless tobacco. She reports that she  does not drink alcohol or use drugs.  Allergies:  Allergies  Allergen Reactions  . Penicillins Hives and Swelling    PATIENT HAS HAD A PCN REACTION WITH IMMEDIATE RASH, FACIAL/TONGUE/THROAT SWELLING, SOB, OR LIGHTHEADEDNESS WITH HYPOTENSION:  #  #  #  YES  #  #  #   Has patient had a PCN reaction causing severe rash involving mucus membranes or skin necrosis: Unknown Has patient had a PCN reaction that required hospitalization: No Has patient had a PCN reaction occurring within the last 10 years: No     Medications: Prior to Admission medications   Medication Sig Start Date End Date Taking? Authorizing Provider  albuterol (PROVENTIL HFA;VENTOLIN HFA) 108 (90 Base) MCG/ACT inhaler Inhale 2 puffs into the lungs every 6 (six) hours as needed for wheezing or shortness of breath. 06/12/17  Yes Terald Sleeper, PA-C  amLODipine (NORVASC) 10 MG tablet Take 1 tablet by mouth once daily Patient taking differently: Take 10 mg by mouth daily.  08/24/18  Yes Terald Sleeper, PA-C  aspirin EC 325 MG EC tablet Take 1 tablet (325 mg total) by mouth daily. 03/09/17  Yes Rinehuls, David L, PA-C  carvedilol (COREG) 12.5 MG tablet TAKE 1 TABLET BY MOUTH TWICE DAILY WITH MEALS Patient taking differently: Take 12.5 mg by mouth 2 (two) times daily with a meal.  09/08/18  Yes Terald Sleeper, PA-C  clopidogrel (PLAVIX) 75 MG tablet Take 1 tablet (75 mg total) by mouth daily. 09/08/18  Yes Terald Sleeper, PA-C  clotrimazole-betamethasone (LOTRISONE) lotion Apply topically 2 (two) times daily. 07/28/18  Yes Terald Sleeper, PA-C  doxazosin (CARDURA) 2 MG tablet Take 2 mg by mouth every evening.  01/06/18  Yes [provider]  furosemide (LASIX) 20 MG tablet Take 1-2 tablets (20-40 mg total) by mouth daily. Patient taking differently: Take 40 mg by mouth daily.  01/07/18  Yes Terald Sleeper, PA-C   hydrocortisone (ANUSOL-HC) 2.5 % rectal cream Place 1 application rectally 2 (two) times daily as needed for hemorrhoids. 07/28/18  Yes Terald Sleeper, PA-C  linaclotide (LINZESS) 145 MCG CAPS capsule Take 1 capsule (145 mcg total) by mouth daily before breakfast. Patient taking differently: Take 145 mcg by mouth daily as needed (for constipation.).  06/12/17  Yes Terald Sleeper, PA-C  losartan (COZAAR) 50 MG tablet Take 50 mg by mouth daily. 01/24/19  Yes [provider]  mupirocin cream (BACTROBAN) 2 % Apply 1 application topically 2 (two) times daily. Patient taking differently: Apply 1 application topically 2 (two) times daily as needed (irritation.).  06/09/16  Yes Terald Sleeper, PA-C  pantoprazole (PROTONIX) 20 MG tablet Take 1 tablet (20 mg total) by mouth daily. (Needs to be seen before next refill) Patient taking differently: Take 20 mg by mouth daily.  07/28/18  Yes Terald Sleeper, PA-C  pravastatin (PRAVACHOL) 20 MG tablet Take 1 tablet (20 mg total) by mouth daily. 07/26/18  Yes Particia Nearing S, PA-C  RAYALDEE 30 MCG CPCR Take 30 mcg by mouth at bedtime.   Yes [provider]  blood glucose meter kit and supplies Dispense based on patient and insurance preference.Check Blood sugar three times a day. DX E11.65 02/12/2019   Terald Sleeper, PA-C  sitaGLIPtin (JANUVIA) 50 MG tablet Take 1 tablet (50 mg total) by mouth daily. 01/25/2019   Terald Sleeper, PA-C    I have reviewed the patient's current medications.  Labs:  Results for orders placed or performed  during the hospital encounter of 01/29/2019 (from the past 48 hour(s))  CBC with Differential     Status: Abnormal   Collection Time: 01/25/2019  2:02 PM  Result Value Ref Range   WBC 10.8 (H) 4.0 - 10.5 K/uL   RBC 4.06 3.87 - 5.11 MIL/uL   Hemoglobin 11.2 (L) 12.0 - 15.0 g/dL   HCT 35.9 (L) 36.0 - 46.0 %   MCV 88.4 80.0 - 100.0 fL   MCH 27.6 26.0 - 34.0 pg   MCHC 31.2 30.0 - 36.0 g/dL   RDW 13.1 11.5 - 15.5 %   Platelets  263 150 - 400 K/uL   nRBC 0.0 0.0 - 0.2 %   Neutrophils Relative % 74 %   Neutro Abs 8.0 (H) 1.7 - 7.7 K/uL   Lymphocytes Relative 16 %   Lymphs Abs 1.7 0.7 - 4.0 K/uL   Monocytes Relative 6 %   Monocytes Absolute 0.6 0.1 - 1.0 K/uL   Eosinophils Relative 3 %   Eosinophils Absolute 0.3 0.0 - 0.5 K/uL   Basophils Relative 1 %   Basophils Absolute 0.1 0.0 - 0.1 K/uL   Immature Granulocytes 0 %   Abs Immature Granulocytes 0.03 0.00 - 0.07 K/uL    Comment: Performed at [facility], [address] [place], [place] [address]  Comprehensive metabolic panel     Status: Abnormal   Collection Time: 02/06/2019  2:02 PM  Result Value Ref Range   Sodium 138 135 - 145 mmol/L   Potassium 3.7 3.5 - 5.1 mmol/L   Chloride 106 98 - 111 mmol/L   CO2 21 (L) 22 - 32 mmol/L   Glucose, Bld 371 (H) 70 - 99 mg/dL   BUN 31 (H) 8 - 23 mg/dL   Creatinine, Ser 2.40 (H) 0.44 - 1.00 mg/dL   Calcium 9.6 8.9 - 10.3 mg/dL   Total Protein 6.5 6.5 - 8.1 g/dL   Albumin 3.3 (L) 3.5 - 5.0 g/dL   AST 15 15 - 41 U/L   ALT 13 0 - 44 U/L   Alkaline Phosphatase 82 38 - 126 U/L   Total Bilirubin 0.5 0.3 - 1.2 mg/dL   GFR calc non Af Amer 19 (L) >60 mL/min   GFR calc Af Amer 22 (L) >60 mL/min   Anion gap 11 5 - 15    Comment: Performed at [facility], [address] [place], [place] [address]  Troponin I (High Sensitivity)     Status: Abnormal   Collection Time: 02/06/2019  2:02 PM  Result Value Ref Range   Troponin I (High Sensitivity) 80 (H) <18 ng/L    Comment: (NOTE) Elevated high sensitivity troponin I (hsTnI) values and significant  changes across serial measurements may suggest ACS but many other  chronic and acute conditions are known to elevate hsTnI results.  Refer to the "Links" section for chest pain algorithms and additional  guidance. Performed at [facility], [address], [place], [place] [address]   Protime-INR     Status: None   Collection Time: 01/20/2019  2:02 PM  Result Value Ref Range    Prothrombin Time 13.4 11.4 - 15.2 seconds   INR 1.0 0.8 - 1.2    Comment: (NOTE) INR goal varies based on device and disease states. Performed at [facility], [address], [place], [place] [address]   Troponin I (High Sensitivity)     Status: Abnormal   Collection Time: 01/27/2019  4:07 PM  Result Value Ref Range  Troponin I (High Sensitivity) 546 (HH) <18 ng/L    Comment: CRITICAL RESULT CALLED TO, READ BACK BY AND VERIFIED WITH: KUEIDER,S ON 01/25/2019 AY 1715 BY LOY,C (NOTE) Elevated high sensitivity troponin I (hsTnI) values and significant  changes across serial measurements may suggest ACS but many other  chronic and acute conditions are known to elevate hsTnI results.  Refer to the Links section for chest pain algorithms and additional  guidance. Performed at [facility], [address] [place], [place] [address]   SARS Coronavirus 2 by RT PCR (hospital order, performed in [facility] hospital lab) Nasopharyngeal Nasopharyngeal Swab     Status: None   Collection Time: 01/25/2019  4:54 PM   Specimen: Nasopharyngeal Swab  Result Value Ref Range   SARS Coronavirus 2 NEGATIVE NEGATIVE    Comment: (NOTE) If result is NEGATIVE SARS-CoV-2 target nucleic acids are NOT DETECTED. The SARS-CoV-2 RNA is generally detectable in upper and lower  respiratory specimens during the acute phase of infection. The lowest  concentration of SARS-CoV-2 viral copies this assay can detect is 250  copies / mL. A negative result does not preclude SARS-CoV-2 infection  and should not be used as the sole basis for treatment or other  patient management decisions.  A negative result may occur with  improper specimen collection / handling, submission of specimen other  than nasopharyngeal swab, presence of viral mutation(s) within the  areas targeted by this assay, and inadequate number of viral copies  (<250 copies / mL). A negative result must be combined with clinical  observations, patient history,  and epidemiological information. If result is POSITIVE SARS-CoV-2 target nucleic acids are DETECTED. The SARS-CoV-2 RNA is generally detectable in upper and lower  respiratory specimens dur ing the acute phase of infection.  Positive  results are indicative of active infection with SARS-CoV-2.  Clinical  correlation with patient history and other diagnostic information is  necessary to determine patient infection status.  Positive results do  not rule out bacterial infection or co-infection with other viruses. If result is PRESUMPTIVE POSTIVE SARS-CoV-2 nucleic acids MAY BE PRESENT.   A presumptive positive result was obtained on the submitted specimen  and confirmed on repeat testing.  While 2019 novel coronavirus  (SARS-CoV-2) nucleic acids may be present in the submitted sample  additional confirmatory testing may be necessary for epidemiological  and / or clinical management purposes  to differentiate between  SARS-CoV-2 and other Sarbecovirus currently known to infect humans.  If clinically indicated additional testing with an alternate test  methodology [phone]) is advised. The SARS-CoV-2 RNA is generally  detectable in upper and lower respiratory sp ecimens during the acute  phase of infection. The expected result is Negative. Fact Sheet for Patients:  StrictlyIdeas.no Fact Sheet for Healthcare Providers: BankingDealers.co.za This test is not yet approved or cleared by the [place] FDA and has been authorized for detection and/or diagnosis of SARS-CoV-2 by FDA under an Emergency Use Authorization (EUA).  This EUA will remain in effect (meaning this test can be used) for the duration of the COVID-19 declaration under Section 564(b)(1) of the Act, 21 U.S.C. section 360bbb-3(b)(1), unless the authorization is terminated or revoked sooner. Performed at [facility] [address] [place], [place] [address]   Heparin level  (unfractionated)     Status: Abnormal   Collection Time: 01/22/2019  9:29 PM  Result Value Ref Range   Heparin Unfractionated 0.82 (H) 0.30 - 0.70 IU/mL    Comment: (NOTE) If heparin  results are below expected values, and patient dosage has  been confirmed, suggest follow up testing of antithrombin III levels. Performed at [facility], [place] [address] [place], [place] [address]   Glucose, capillary     Status: Abnormal   Collection Time: 01/24/2019 11:22 PM  Result Value Ref Range   Glucose-Capillary 311 (H) 70 - 99 mg/dL  MRSA PCR Screening     Status: None   Collection Time: 02/01/19  2:45 AM   Specimen: Nasal Mucosa; Nasopharyngeal  Result Value Ref Range   MRSA by PCR NEGATIVE NEGATIVE    Comment:        The GeneXpert MRSA Assay (FDA approved for NASAL specimens only), is one component of a comprehensive MRSA colonization surveillance program. It is not intended to diagnose MRSA infection nor to guide or monitor treatment for MRSA infections. Performed at [facility], [place] [address] [place], [place] [address]   Basic metabolic panel     Status: Abnormal   Collection Time: 02/01/19  2:49 AM  Result Value Ref Range   Sodium 138 135 - 145 mmol/L   Potassium 3.6 3.5 - 5.1 mmol/L   Chloride 108 98 - 111 mmol/L   CO2 19 (L) 22 - 32 mmol/L   Glucose, Bld 196 (H) 70 - 99 mg/dL   BUN 30 (H) 8 - 23 mg/dL   Creatinine, Ser 2.81 (H) 0.44 - 1.00 mg/dL   Calcium 8.9 8.9 - 10.3 mg/dL   GFR calc non Af Amer 16 (L) >60 mL/min   GFR calc Af Amer 19 (L) >60 mL/min   Anion gap 11 5 - 15    Comment: Performed at [facility], [place] [address] [place], [place] [address]  Heparin level (unfractionated)     Status: None   Collection Time: 02/01/19  2:59 AM  Result Value Ref Range   Heparin Unfractionated 0.44 0.30 - 0.70 IU/mL    Comment: (NOTE) If heparin results are below expected values, and patient dosage has  been confirmed, suggest follow up testing of  antithrombin III levels. Performed at [facility], [place] [address], [place], [place] [address]   CBC     Status: Abnormal   Collection Time: 02/01/19  2:59 AM  Result Value Ref Range   WBC 10.2 4.0 - 10.5 K/uL   RBC 3.41 (L) 3.87 - 5.11 MIL/uL   Hemoglobin 9.6 (L) 12.0 - 15.0 g/dL   HCT 29.9 (L) 36.0 - 46.0 %   MCV 87.7 80.0 - 100.0 fL   MCH 28.2 26.0 - 34.0 pg   MCHC 32.1 30.0 - 36.0 g/dL   RDW 13.0 11.5 - 15.5 %   Platelets 258 150 - 400 K/uL   nRBC 0.0 0.0 - 0.2 %    Comment: Performed at [facility], [place] [address] [place], [place] [address]  Lipid panel     Status: Abnormal   Collection Time: 02/01/19  2:59 AM  Result Value Ref Range   Cholesterol 127 0 - 200 mg/dL   Triglycerides 91 <150 mg/dL   HDL 35 (L) >40 mg/dL   Total CHOL/HDL Ratio 3.6 RATIO   VLDL 18 0 - 40 mg/dL   LDL Cholesterol 74 0 - 99 mg/dL    Comment:        Total Cholesterol/HDL:CHD Risk Coronary Heart Disease Risk Table  Men   Women  1/2 Average Risk   3.4   3.3  Average Risk       5.0   4.4  2 X Average Risk   9.6   7.1  3 X Average Risk  23.4   11.0        Use the calculated Patient Ratio above and the CHD Risk Table to determine the patient's CHD Risk.        ATP III CLASSIFICATION (LDL):  <100     mg/dL   Optimal  100-129  mg/dL   Near or Above                    Optimal  130-159  mg/dL   Borderline  160-189  mg/dL   High  >190     mg/dL   Very High Performed at [place] [address] [place], [place] [address]   TSH     Status: Abnormal   Collection Time: 02/01/19  2:59 AM  Result Value Ref Range   TSH 6.927 (H) 0.350 - 4.500 uIU/mL    Comment: Performed by a 3rd Generation assay with a functional sensitivity of <=0.01 uIU/mL. Performed at [facility], [place] [address], [place], [place] [address]   Troponin I (High Sensitivity)     Status: Abnormal   Collection Time: 02/01/19  2:59 AM  Result Value Ref Range   Troponin I (High  Sensitivity) 3,266 (HH) <18 ng/L    Comment: CRITICAL RESULT CALLED TO, READ BACK BY AND VERIFIED WITH: Louie Casa [id number] WILDERK (NOTE) Elevated high sensitivity troponin I (hsTnI) values and significant  changes across serial measurements may suggest ACS but many other  chronic and acute conditions are known to elevate hsTnI results.  Refer to the Links section for chest pain algorithms and additional  guidance. Performed at [facility], [place] [address], [place], [place] [address]   T4, free     Status: Abnormal   Collection Time: 02/01/19  2:59 AM  Result Value Ref Range   Free T4 1.37 (H) 0.61 - 1.12 ng/dL    Comment: (NOTE) Biotin ingestion may interfere with free T4 tests. If the results are inconsistent with the TSH level, previous test results, or the clinical presentation, then consider biotin interference. If needed, order repeat testing after stopping biotin. Performed at [facility], [place] [address], [place],  [address]   Glucose, capillary     Status: Abnormal   Collection Time: 02/01/19  6:48 AM  Result Value Ref Range   Glucose-Capillary 136 (H) 70 - 99 mg/dL   Comment 1 Notify RN    Comment 2 Document in Chart   Glucose, capillary     Status: Abnormal   Collection Time: 02/01/19 11:37 AM  Result Value Ref Range   Glucose-Capillary 132 (H) 70 - 99 mg/dL     ROS:  Review of systems not obtained due to patient factors.  She is actively having chest pain and being tended to  Physical Exam: Vitals:   02/01/19 1400 02/01/19 1415  BP: (!) 121/56   Pulse: 73 66  Resp: 20 18  Temp:    SpO2: 97% 97%     General: Actively complaining of chest pain and dry heaving-nursing is trying to get her some relief HEENT: Pupils equally round and reactive to light Neck: No JVD Heart: Bradycardic Lungs: Not examined Abdomen: Soft, nontender Extremities: No edema appreciated Skin:  Warm and dry Neuro: Very uncomfortable at present.  Seems  to be alert and moving all extremities spontaneously  Assessment/Plan: 73 year old white female with longstanding diabetes mellitus with complications, likely diabetic neuropathy, diffuse vascular disease now presenting with unstable angina/acute coronary syndrome 1.Renal-patient has known CKD likely thought due to diabetic nephropathy.  Creatinine in the low twos earlier this year.  2.4 is close to her baseline.  There appears to be worsening.  Urine output not recorded.  Will check urinalysis for other clues.  I suspect it is the hypotension that is giving her some acute kidney injury.  I agree with holding the losartan.  I will also discontinue the amlodipine and Cardura while things are little bit more unpredictable in terms of blood pressure.  2. Hypertension/volume  -volume status seems okay.  Blood pressure has been low at times on a nitroglycerin drip.  Patient also with intermittent chest pain.  This possibly seems like an unstable situation and she may get to a point of requiring cardiac catheterization.  She is at risk from a CKD standpoint for worsening renal function after cardiac catheterization but likely will just need to deal with aftermath as unfortunately there is not much preparation that can take place. I would not give her fluids at this time  3. Anemia  -hemoglobin has dropped some.  Will check for iron stores and replete as well as treat with ESA as needed   Louis Meckel 02/01/2019, 2:21 PM

## 2019-02-01 NOTE — Progress Notes (Deleted)
Around 1200 patient noted to be desatting to upper 70s-low 80s. Elsworth Soho MD at bedside. Patient placed on NRB. Sats now 100% and patient appears more comfortable. Around 1330 patient called out to inform this RN that she "felt like she was suffocating". Elsworth Soho MD notified. Ordered to place patient on BiPAP and do chest PT. RRT notified. Kary Kos NP at bedside to assess patient. Patient placed on BiPAP. Will alternate that with HFNC. Will continue to monitor patient closely.

## 2019-02-01 NOTE — Significant Event (Signed)
Rapid Response Event Note  Overview: Time Called: 1450 Arrival Time: 1453 Event Type: Cardiac  Initial Focused Assessment: Pt sitting in bed A&O c/o chest pressure radiating to shoulder. Currently on Heparin and Niro gtt. BP 131/60, HR 81, RR 20, spO2 97% on 3L [place]. Pt had also been c/o nausea and received zofran prior to my arrival.   Interventions: Cardiology notified EKG Increase Nitro gtt Fluids started 2mg  Morphine  Plan of Care (if not transferred): Continue to monitor pt status. Waiting on further recommendations per renal (saw pt just before CP started back). Pain has nearly resolved with nitro and morphine. RN instructed to call back with any changes or concerns. Will follow along and check back with pt today.   Event Summary: Name of Physician Notified: Harrington Challenger at (paged prior to my arrival)   Event ended at  1520  Outcome: Stayed in room and stabalized     Sherilyn Dacosta

## 2019-02-02 ENCOUNTER — Inpatient Hospital Stay (HOSPITAL_COMMUNITY): Payer: Medicare Other

## 2019-02-02 ENCOUNTER — Encounter (HOSPITAL_COMMUNITY): Admission: EM | Disposition: E | Payer: Self-pay | Source: Home / Self Care | Attending: Cardiothoracic Surgery

## 2019-02-02 ENCOUNTER — Other Ambulatory Visit: Payer: Self-pay | Admitting: Cardiology

## 2019-02-02 DIAGNOSIS — I2510 Atherosclerotic heart disease of native coronary artery without angina pectoris: Secondary | ICD-10-CM

## 2019-02-02 DIAGNOSIS — I214 Non-ST elevation (NSTEMI) myocardial infarction: Secondary | ICD-10-CM | POA: Diagnosis not present

## 2019-02-02 DIAGNOSIS — N183 Chronic kidney disease, stage 3 unspecified: Secondary | ICD-10-CM

## 2019-02-02 DIAGNOSIS — E785 Hyperlipidemia, unspecified: Secondary | ICD-10-CM

## 2019-02-02 DIAGNOSIS — Z01810 Encounter for preprocedural cardiovascular examination: Secondary | ICD-10-CM | POA: Diagnosis not present

## 2019-02-02 DIAGNOSIS — I25110 Atherosclerotic heart disease of native coronary artery with unstable angina pectoris: Secondary | ICD-10-CM

## 2019-02-02 HISTORY — PX: LEFT HEART CATH AND CORONARY ANGIOGRAPHY: CATH118249

## 2019-02-02 HISTORY — PX: IABP INSERTION: CATH118242

## 2019-02-02 LAB — CBC
HCT: 30.6 % — ABNORMAL LOW (ref 36.0–46.0)
Hemoglobin: 9.4 g/dL — ABNORMAL LOW (ref 12.0–15.0)
MCH: 28.1 pg (ref 26.0–34.0)
MCHC: 30.7 g/dL (ref 30.0–36.0)
MCV: 91.6 fL (ref 80.0–100.0)
Platelets: 272 10*3/uL (ref 150–400)
RBC: 3.34 MIL/uL — ABNORMAL LOW (ref 3.87–5.11)
RDW: 12.9 % (ref 11.5–15.5)
WBC: 11.7 10*3/uL — ABNORMAL HIGH (ref 4.0–10.5)
nRBC: 0 % (ref 0.0–0.2)

## 2019-02-02 LAB — RENAL FUNCTION PANEL
Albumin: 2.8 g/dL — ABNORMAL LOW (ref 3.5–5.0)
Anion gap: 10 (ref 5–15)
BUN: 33 mg/dL — ABNORMAL HIGH (ref 8–23)
CO2: 18 mmol/L — ABNORMAL LOW (ref 22–32)
Calcium: 8.6 mg/dL — ABNORMAL LOW (ref 8.9–10.3)
Chloride: 107 mmol/L (ref 98–111)
Creatinine, Ser: 3.2 mg/dL — ABNORMAL HIGH (ref 0.44–1.00)
GFR calc Af Amer: 16 mL/min — ABNORMAL LOW (ref >60)
GFR calc non Af Amer: 14 mL/min — ABNORMAL LOW (ref >60)
Glucose, Bld: 210 mg/dL — ABNORMAL HIGH (ref 70–99)
Phosphorus: 4.3 mg/dL (ref 2.5–4.6)
Potassium: 4.1 mmol/L (ref 3.5–5.1)
Sodium: 135 mmol/L (ref 135–145)

## 2019-02-02 LAB — POCT I-STAT 7, (LYTES, BLD GAS, ICA,H+H)
Acid-base deficit: 8 mmol/L — ABNORMAL HIGH (ref 0.0–2.0)
Bicarbonate: 17.1 mmol/L — ABNORMAL LOW (ref 20.0–28.0)
Calcium, Ion: 1.24 mmol/L (ref 1.15–1.40)
HCT: 25 % — ABNORMAL LOW (ref 36.0–46.0)
Hemoglobin: 8.5 g/dL — ABNORMAL LOW (ref 12.0–15.0)
O2 Saturation: 87 %
Potassium: 3.7 mmol/L (ref 3.5–5.1)
Sodium: 137 mmol/L (ref 135–145)
TCO2: 18 mmol/L — ABNORMAL LOW (ref 22–32)
pCO2 arterial: 33.3 mmHg (ref 32.0–48.0)
pH, Arterial: 7.318 — ABNORMAL LOW (ref 7.350–7.450)
pO2, Arterial: 57 mmHg — ABNORMAL LOW (ref 83.0–108.0)

## 2019-02-02 LAB — POCT I-STAT EG7
Acid-base deficit: 7 mmol/L — ABNORMAL HIGH (ref 0.0–2.0)
Bicarbonate: 18.8 mmol/L — ABNORMAL LOW (ref 20.0–28.0)
Calcium, Ion: 1.24 mmol/L (ref 1.15–1.40)
HCT: 26 % — ABNORMAL LOW (ref 36.0–46.0)
Hemoglobin: 8.8 g/dL — ABNORMAL LOW (ref 12.0–15.0)
O2 Saturation: 51 %
Potassium: 3.7 mmol/L (ref 3.5–5.1)
Sodium: 137 mmol/L (ref 135–145)
TCO2: 20 mmol/L — ABNORMAL LOW (ref 22–32)
pCO2, Ven: 39.7 mmHg — ABNORMAL LOW (ref 44.0–60.0)
pH, Ven: 7.284 (ref 7.250–7.430)
pO2, Ven: 30 mmHg — CL (ref 32.0–45.0)

## 2019-02-02 LAB — GLUCOSE, CAPILLARY
Glucose-Capillary: 172 mg/dL — ABNORMAL HIGH (ref 70–99)
Glucose-Capillary: 126 mg/dL — ABNORMAL HIGH (ref 70–99)
Glucose-Capillary: 198 mg/dL — ABNORMAL HIGH (ref 70–99)
Glucose-Capillary: 300 mg/dL — ABNORMAL HIGH (ref 70–99)

## 2019-02-02 LAB — FERRITIN: Ferritin: 54 ng/mL (ref 11–307)

## 2019-02-02 LAB — HEPARIN LEVEL (UNFRACTIONATED): Heparin Unfractionated: 0.32 IU/mL (ref 0.30–0.70)

## 2019-02-02 LAB — SURGICAL PCR SCREEN
MRSA, PCR: NEGATIVE
Staphylococcus aureus: NEGATIVE

## 2019-02-02 LAB — IRON AND TIBC
Iron: 46 ug/dL (ref 28–170)
Saturation Ratios: 19 % (ref 10.4–31.8)
TIBC: 237 ug/dL — ABNORMAL LOW (ref 250–450)
UIBC: 191 ug/dL

## 2019-02-02 SURGERY — LEFT HEART CATH AND CORONARY ANGIOGRAPHY
Anesthesia: LOCAL | Laterality: Right

## 2019-02-02 MED ORDER — SODIUM CHLORIDE 0.9 % IV SOLN
250.0000 mL | INTRAVENOUS | Status: DC | PRN
  Administered 2019-02-03: 19:00:00 via INTRAVENOUS

## 2019-02-02 MED ORDER — DARBEPOETIN ALFA 60 MCG/0.3ML IJ SOSY
60.0000 ug | PREFILLED_SYRINGE | INTRAMUSCULAR | Status: DC
  Administered 2019-02-02 – 2019-02-09 (×2): 60 ug via SUBCUTANEOUS
  Filled 2019-02-02 (×2): qty 0.3

## 2019-02-02 MED ORDER — TRANEXAMIC ACID (OHS) PUMP PRIME SOLUTION
2.0000 mg/kg | INTRAVENOUS | Status: DC
  Filled 2019-02-02: qty 1.73

## 2019-02-02 MED ORDER — SODIUM CHLORIDE 0.9 % IV SOLN
510.0000 mg | Freq: Once | INTRAVENOUS | Status: AC
  Administered 2019-02-02: 510 mg via INTRAVENOUS
  Filled 2019-02-02: qty 17

## 2019-02-02 MED ORDER — PHENYLEPHRINE HCL-NACL 20-0.9 MG/250ML-% IV SOLN: 30.0000 ug/min | INTRAVENOUS | Status: DC

## 2019-02-02 MED ORDER — MILRINONE LACTATE IN DEXTROSE 20-5 MG/100ML-% IV SOLN
0.3000 ug/kg/min | INTRAVENOUS | Status: DC
  Filled 2019-02-02: qty 100

## 2019-02-02 MED ORDER — CHLORHEXIDINE GLUCONATE CLOTH 2 % EX PADS
6.0000 | MEDICATED_PAD | Freq: Every day | CUTANEOUS | Status: DC
  Administered 2019-02-02: 6 via TOPICAL

## 2019-02-02 MED ORDER — INSULIN REGULAR(HUMAN) IN NACL 100-0.9 UT/100ML-% IV SOLN
INTRAVENOUS | Status: AC
  Administered 2019-02-03: 3.5 [IU]/h via INTRAVENOUS
  Filled 2019-02-02: qty 100

## 2019-02-02 MED ORDER — CHLORHEXIDINE GLUCONATE CLOTH 2 % EX PADS
6.0000 | MEDICATED_PAD | Freq: Once | CUTANEOUS | Status: AC
  Administered 2019-02-02: 6 via TOPICAL

## 2019-02-02 MED ORDER — HEPARIN SODIUM (PORCINE) 1000 UNIT/ML IJ SOLN
INTRAMUSCULAR | Status: AC
  Filled 2019-02-02: qty 1

## 2019-02-02 MED ORDER — EPINEPHRINE HCL 5 MG/250ML IV SOLN IN NS
0.0000 ug/min | INTRAVENOUS | Status: DC
  Filled 2019-02-02: qty 250

## 2019-02-02 MED ORDER — VERAPAMIL HCL 2.5 MG/ML IV SOLN
INTRAVENOUS | Status: AC
  Filled 2019-02-02: qty 2

## 2019-02-02 MED ORDER — ASPIRIN 81 MG PO CHEW: 81.0000 mg | CHEWABLE_TABLET | ORAL | Status: AC

## 2019-02-02 MED ORDER — SODIUM CHLORIDE 0.9% FLUSH: 3.0000 mL | INTRAVENOUS | Status: DC | PRN

## 2019-02-02 MED ORDER — SODIUM CHLORIDE 0.9% FLUSH
3.0000 mL | Freq: Two times a day (BID) | INTRAVENOUS | Status: DC
  Administered 2019-02-02: 3 mL via INTRAVENOUS

## 2019-02-02 MED ORDER — FENTANYL CITRATE (PF) 100 MCG/2ML IJ SOLN
INTRAMUSCULAR | Status: DC | PRN
  Administered 2019-02-02 (×3): 25 ug via INTRAVENOUS

## 2019-02-02 MED ORDER — SODIUM CHLORIDE 0.9 % IV SOLN: INTRAVENOUS | Status: DC

## 2019-02-02 MED ORDER — TRANEXAMIC ACID 1000 MG/10ML IV SOLN: 1.5000 mg/kg/h | INTRAVENOUS | Status: DC

## 2019-02-02 MED ORDER — TRANEXAMIC ACID (OHS) BOLUS VIA INFUSION: 15.0000 mg/kg | INTRAVENOUS | Status: DC

## 2019-02-02 MED ORDER — HEPARIN (PORCINE) IN NACL 1000-0.9 UT/500ML-% IV SOLN
INTRAVENOUS | Status: AC
  Filled 2019-02-02: qty 1000

## 2019-02-02 MED ORDER — SODIUM BICARBONATE-DEXTROSE 150-5 MEQ/L-% IV SOLN
150.0000 meq | INTRAVENOUS | Status: DC
  Administered 2019-02-02 – 2019-02-03 (×2): 150 meq via INTRAVENOUS
  Filled 2019-02-02 (×2): qty 1000

## 2019-02-02 MED ORDER — MORPHINE SULFATE (PF) 2 MG/ML IV SOLN
2.0000 mg | Freq: Once | INTRAVENOUS | Status: DC
  Filled 2019-02-02: qty 1

## 2019-02-02 MED ORDER — NITROGLYCERIN IN D5W 200-5 MCG/ML-% IV SOLN: 2.0000 ug/min | INTRAVENOUS | Status: DC

## 2019-02-02 MED ORDER — NITROGLYCERIN IN D5W 200-5 MCG/ML-% IV SOLN
2.0000 ug/min | INTRAVENOUS | Status: DC
  Filled 2019-02-02: qty 250

## 2019-02-02 MED ORDER — TRANEXAMIC ACID (OHS) BOLUS VIA INFUSION
15.0000 mg/kg | INTRAVENOUS | Status: AC
  Administered 2019-02-03: 1296 mg via INTRAVENOUS
  Filled 2019-02-02: qty 1296

## 2019-02-02 MED ORDER — POTASSIUM CHLORIDE 2 MEQ/ML IV SOLN
80.0000 meq | INTRAVENOUS | Status: DC
  Filled 2019-02-02: qty 40

## 2019-02-02 MED ORDER — ONDANSETRON HCL 4 MG/2ML IJ SOLN
INTRAMUSCULAR | Status: AC
  Filled 2019-02-02: qty 2

## 2019-02-02 MED ORDER — SODIUM CHLORIDE 0.9 % IV SOLN
INTRAVENOUS | Status: DC
  Administered 2019-02-02: 09:00:00 via INTRAVENOUS

## 2019-02-02 MED ORDER — TRANEXAMIC ACID (OHS) PUMP PRIME SOLUTION: 2.0000 mg/kg | INTRAVENOUS | Status: DC

## 2019-02-02 MED ORDER — SODIUM CHLORIDE 0.9 % IV SOLN
INTRAVENOUS | Status: DC
  Filled 2019-02-02: qty 30

## 2019-02-02 MED ORDER — DOPAMINE-DEXTROSE 3.2-5 MG/ML-% IV SOLN
0.0000 ug/kg/min | INTRAVENOUS | Status: DC
  Filled 2019-02-02: qty 250

## 2019-02-02 MED ORDER — MIDAZOLAM HCL 2 MG/2ML IJ SOLN
INTRAMUSCULAR | Status: DC | PRN
  Administered 2019-02-02: 1 mg via INTRAVENOUS

## 2019-02-02 MED ORDER — HEPARIN (PORCINE) 25000 UT/250ML-% IV SOLN
700.0000 [IU]/h | INTRAVENOUS | Status: DC
  Administered 2019-02-02: 700 [IU]/h via INTRAVENOUS
  Filled 2019-02-02 (×2): qty 250

## 2019-02-02 MED ORDER — METOPROLOL TARTRATE 12.5 MG HALF TABLET
12.5000 mg | ORAL_TABLET | Freq: Once | ORAL | Status: AC
  Administered 2019-02-03: 12.5 mg via ORAL
  Filled 2019-02-02: qty 1

## 2019-02-02 MED ORDER — PLASMA-LYTE 148 IV SOLN
INTRAVENOUS | Status: DC
  Filled 2019-02-02: qty 2.5

## 2019-02-02 MED ORDER — CHLORHEXIDINE GLUCONATE CLOTH 2 % EX PADS
6.0000 | MEDICATED_PAD | Freq: Once | CUTANEOUS | Status: AC
  Administered 2019-02-03: 6 via TOPICAL

## 2019-02-02 MED ORDER — VANCOMYCIN HCL 10 G IV SOLR
1500.0000 mg | INTRAVENOUS | Status: AC
  Administered 2019-02-03: 1500 mg via INTRAVENOUS
  Filled 2019-02-02: qty 1500

## 2019-02-02 MED ORDER — LEVOFLOXACIN IN D5W 500 MG/100ML IV SOLN
500.0000 mg | INTRAVENOUS | Status: AC
  Administered 2019-02-03: 16:00:00 500 mg via INTRAVENOUS
  Filled 2019-02-02 (×2): qty 100

## 2019-02-02 MED ORDER — PLASMA-LYTE 148 IV SOLN: INTRAVENOUS | Status: DC

## 2019-02-02 MED ORDER — DEXMEDETOMIDINE HCL IN NACL 400 MCG/100ML IV SOLN: 0.1000 ug/kg/h | INTRAVENOUS | Status: DC

## 2019-02-02 MED ORDER — VANCOMYCIN HCL 1000 MG IV SOLR
INTRAVENOUS | Status: DC
  Filled 2019-02-02: qty 1000

## 2019-02-02 MED ORDER — MAGNESIUM SULFATE 50 % IJ SOLN
40.0000 meq | INTRAMUSCULAR | Status: DC
  Filled 2019-02-02: qty 9.85

## 2019-02-02 MED ORDER — DEXMEDETOMIDINE HCL IN NACL 400 MCG/100ML IV SOLN
0.1000 ug/kg/h | INTRAVENOUS | Status: AC
  Administered 2019-02-03: 17:00:00 0.4 ug/kg/h via INTRAVENOUS
  Administered 2019-02-03: 0.7 ug/kg/h via INTRAVENOUS
  Filled 2019-02-02: qty 100

## 2019-02-02 MED ORDER — SODIUM CHLORIDE 0.9 % IV SOLN: 750.0000 mg | INTRAVENOUS | Status: DC

## 2019-02-02 MED ORDER — MIDAZOLAM HCL 2 MG/2ML IJ SOLN
INTRAMUSCULAR | Status: AC
  Filled 2019-02-02: qty 2

## 2019-02-02 MED ORDER — DOPAMINE-DEXTROSE 3.2-5 MG/ML-% IV SOLN: 0.0000 ug/kg/min | INTRAVENOUS | Status: DC

## 2019-02-02 MED ORDER — LIDOCAINE HCL (PF) 1 % IJ SOLN
INTRAMUSCULAR | Status: AC
  Filled 2019-02-02: qty 30

## 2019-02-02 MED ORDER — PHENYLEPHRINE HCL-NACL 20-0.9 MG/250ML-% IV SOLN
30.0000 ug/min | INTRAVENOUS | Status: AC
  Administered 2019-02-03: 25 ug/min via INTRAVENOUS
  Filled 2019-02-02: qty 250

## 2019-02-02 MED ORDER — FENTANYL CITRATE (PF) 100 MCG/2ML IJ SOLN
INTRAMUSCULAR | Status: AC
  Filled 2019-02-02: qty 2

## 2019-02-02 MED ORDER — INSULIN REGULAR(HUMAN) IN NACL 100-0.9 UT/100ML-% IV SOLN: INTRAVENOUS | Status: DC

## 2019-02-02 MED ORDER — TRANEXAMIC ACID 1000 MG/10ML IV SOLN
1.5000 mg/kg/h | INTRAVENOUS | Status: AC
  Administered 2019-02-03: 1.5 mg/kg/h via INTRAVENOUS
  Filled 2019-02-02: qty 25

## 2019-02-02 MED ORDER — CHLORHEXIDINE GLUCONATE CLOTH 2 % EX PADS: 6.0000 | MEDICATED_PAD | Freq: Once | CUTANEOUS | Status: DC

## 2019-02-02 MED ORDER — VERAPAMIL HCL 2.5 MG/ML IV SOLN
INTRAVENOUS | Status: DC | PRN
  Administered 2019-02-02 (×2): 10 mL via INTRA_ARTERIAL

## 2019-02-02 MED ORDER — NOREPINEPHRINE 4 MG/250ML-% IV SOLN
0.0000 ug/min | INTRAVENOUS | Status: DC
  Filled 2019-02-02: qty 250

## 2019-02-02 MED ORDER — EPINEPHRINE HCL 5 MG/250ML IV SOLN IN NS: 0.0000 ug/min | INTRAVENOUS | Status: DC

## 2019-02-02 MED ORDER — HEPARIN SODIUM (PORCINE) 1000 UNIT/ML IJ SOLN
INTRAMUSCULAR | Status: DC | PRN
  Administered 2019-02-02: 4000 [IU] via INTRAVENOUS
  Administered 2019-02-02: 3000 [IU] via INTRAVENOUS

## 2019-02-02 MED ORDER — VANCOMYCIN HCL 10 G IV SOLR: 1500.0000 mg | INTRAVENOUS | Status: DC

## 2019-02-02 MED ORDER — TEMAZEPAM 15 MG PO CAPS
15.0000 mg | ORAL_CAPSULE | Freq: Once | ORAL | Status: AC | PRN
  Administered 2019-02-02: 15 mg via ORAL
  Filled 2019-02-02: qty 1

## 2019-02-02 MED ORDER — FENTANYL CITRATE (PF) 100 MCG/2ML IJ SOLN
50.0000 ug | INTRAMUSCULAR | Status: DC | PRN
  Administered 2019-02-02 (×3): 50 ug via INTRAVENOUS
  Administered 2019-02-03 (×3): 250 ug via INTRAVENOUS
  Administered 2019-02-03: 50 ug via INTRAVENOUS
  Administered 2019-02-03 (×2): 250 ug via INTRAVENOUS
  Administered 2019-02-03: 50 ug via INTRAVENOUS
  Administered 2019-02-03: 250 ug via INTRAVENOUS
  Filled 2019-02-02 (×6): qty 2

## 2019-02-02 MED ORDER — BISACODYL 5 MG PO TBEC: 5.0000 mg | DELAYED_RELEASE_TABLET | Freq: Once | ORAL | Status: DC

## 2019-02-02 MED ORDER — SODIUM CHLORIDE 0.9 % IV SOLN: 250.0000 mL | INTRAVENOUS | Status: DC | PRN

## 2019-02-02 MED ORDER — POTASSIUM CHLORIDE 2 MEQ/ML IV SOLN: 80.0000 meq | INTRAVENOUS | Status: DC

## 2019-02-02 MED ORDER — SODIUM CHLORIDE 0.9 % IV SOLN: 1.5000 g | INTRAVENOUS | Status: DC

## 2019-02-02 MED ORDER — IOHEXOL 350 MG/ML SOLN
INTRAVENOUS | Status: DC | PRN
  Administered 2019-02-02: 14:00:00 50 mL

## 2019-02-02 MED ORDER — MAGNESIUM SULFATE 50 % IJ SOLN: 40.0000 meq | INTRAMUSCULAR | Status: DC

## 2019-02-02 MED ORDER — SODIUM CHLORIDE 0.9% FLUSH: 3.0000 mL | Freq: Two times a day (BID) | INTRAVENOUS | Status: DC

## 2019-02-02 MED ORDER — LORAZEPAM 0.5 MG PO TABS
0.5000 mg | ORAL_TABLET | ORAL | Status: DC | PRN
  Administered 2019-02-02: 0.5 mg via ORAL
  Filled 2019-02-02: qty 1

## 2019-02-02 MED ORDER — CHLORHEXIDINE GLUCONATE 0.12 % MT SOLN
15.0000 mL | Freq: Once | OROMUCOSAL | Status: AC
  Administered 2019-02-03: 15 mL via OROMUCOSAL
  Filled 2019-02-02: qty 15

## 2019-02-02 MED ORDER — LIDOCAINE HCL (PF) 1 % IJ SOLN
INTRAMUSCULAR | Status: DC | PRN
  Administered 2019-02-02: 2 mL
  Administered 2019-02-02: 18 mL

## 2019-02-02 MED ORDER — MILRINONE LACTATE IN DEXTROSE 20-5 MG/100ML-% IV SOLN: 0.3000 ug/kg/min | INTRAVENOUS | Status: DC

## 2019-02-02 MED ORDER — HEPARIN (PORCINE) IN NACL 1000-0.9 UT/500ML-% IV SOLN
INTRAVENOUS | Status: DC | PRN
  Administered 2019-02-02 (×2): 500 mL

## 2019-02-02 SURGICAL SUPPLY — 21 items
BALLN IABP SENSA PLUS 7.5F 40C (BALLOONS) ×3
BALLOON IABP SENS PLUS 7.5F40C (BALLOONS) IMPLANT
CATH INFINITI 5 FR JL3.5 (CATHETERS) ×1 IMPLANT
CATH INFINITI 5FR JK (CATHETERS) ×1 IMPLANT
CATH SWAN GANZ 7F STRAIGHT (CATHETERS) ×1 IMPLANT
CLOSURE MYNX CONTROL 6F/7F (Vascular Products) ×1 IMPLANT
DEVICE RAD COMP TR BAND LRG (VASCULAR PRODUCTS) ×1 IMPLANT
DEVICE SECURE STATLOCK IABP (MISCELLANEOUS) ×2 IMPLANT
ELECT DEFIB PAD ADLT CADENCE (PAD) ×1 IMPLANT
GLIDESHEATH SLEND SS 6F .021 (SHEATH) ×1 IMPLANT
GUIDEWIRE INQWIRE 1.5J.035X260 (WIRE) IMPLANT
INQWIRE 1.5J .035X260CM (WIRE) ×3
KIT HEART LEFT (KITS) ×3 IMPLANT
KIT MICROPUNCTURE NIT STIFF (SHEATH) ×1 IMPLANT
PACK CARDIAC CATHETERIZATION (CUSTOM PROCEDURE TRAY) ×3 IMPLANT
SHEATH PINNACLE 5F 10CM (SHEATH) ×1 IMPLANT
SHEATH PINNACLE 7F 10CM (SHEATH) ×1 IMPLANT
SYR MEDRAD MARK 7 150ML (SYRINGE) ×3 IMPLANT
TRANSDUCER W/STOPCOCK (MISCELLANEOUS) ×3 IMPLANT
TUBING CIL FLEX 10 FLL-RA (TUBING) ×3 IMPLANT
WIRE EMERALD 3MM-J .035X150CM (WIRE) ×1 IMPLANT

## 2019-02-02 NOTE — Interval H&P Note (Signed)
Cath Lab Visit (complete for each Cath Lab visit)  Clinical Evaluation Leading to the Procedure:   ACS: Yes.    Non-ACS:  n/a   History and Physical Interval Note:  02/07/2019 1:09 PM  Paula Watson  has presented today for surgery, with the diagnosis of n stemi.  The various methods of treatment have been discussed with the patient and family. After consideration of risks, benefits and other options for treatment, the patient has consented to  Procedure(s): LEFT HEART CATH AND CORONARY ANGIOGRAPHY (N/A) as a surgical intervention.  The patient's history has been reviewed, patient examined, no change in status, stable for surgery.  I have reviewed the patient's chart and labs.  Questions were answered to the patient's satisfaction.     Kathlyn Sacramento

## 2019-02-02 NOTE — H&P (View-Only) (Signed)
Progress Note  Patient Name: Paula Watson Date of Encounter: 01/20/2019  Primary Cardiologist:  Kate Sable, MD  Subjective   No chest pain or SOB now.  She did have some last night    Inpatient Medications    Scheduled Meds: . aspirin  81 mg Oral Daily  . atorvastatin  40 mg Oral q1800  . Calcifediol ER  30 mcg Oral QHS  . carvedilol  12.5 mg Oral BID WC  . clopidogrel  75 mg Oral Daily  . darbepoetin (ARANESP) injection - NON-DIALYSIS  60 mcg Subcutaneous Q Wed-1800  . insulin aspart  0-5 Units Subcutaneous QHS  . insulin aspart  0-9 Units Subcutaneous TID WC  .  morphine injection  2 mg Intravenous Once  . pantoprazole  20 mg Oral Daily   Continuous Infusions: . sodium chloride 60 mL/hr at 02/09/2019 0500  . ferumoxytol 510 mg (02/04/2019 0805)  . heparin 700 Units/hr (02/07/2019 0500)  . nitroGLYCERIN 30 mcg/min (01/18/2019 0500)   PRN Meds: acetaminophen, albuterol, linaclotide, nitroGLYCERIN, ondansetron (ZOFRAN) IV   Vital Signs    Vitals:   01/26/2019 0300 02/01/2019 0330 02/08/2019 0500 02/12/2019 0757  BP: (!) 107/47 110/62 (!) 109/57 121/70  Pulse: 67 71 64 63  Resp: 13 13 11 14   Temp:   98 F (36.7 C) 98.4 F (36.9 C)  TempSrc:   Tympanic Oral  SpO2: 95% 96% 97% 98%  Weight:   86.4 kg   Height:        Intake/Output Summary (Last 24 hours) at 02/06/2019 0810 Last data filed at 02/04/2019 0500 Gross per 24 hour  Intake 1145.34 ml  Output 150 ml  Net 995.34 ml   Filed Weights   01/23/2019 2013 02/01/19 0638 02/03/2019 0500  Weight: 81.6 kg 82.5 kg 86.4 kg   Last Weight  Most recent update: 02/09/2019  5:17 AM   Weight  86.4 kg (190 lb 7.6 oz)           Weight change: 4.753 kg   Telemetry    SR, Sinus brady - Personally Reviewed  ECG     SR 70   ST dDepression (downsloping) I, II, V3 to V6)   Personally Reviewed  Physical Exam   General: Obese 73 yo , female appearing in no acute distress. Head: Normocephalic, atraumatic.  Neck: Neck  is full   Lungs:  Resp regular and unlabored, CTA.  No wheezing  Heart: RRR, S1, S2, no S3, S4, or murmur; no rub. Abdomen: Soft, non-tender, non-distended with normoactive bowel sounds. No hepatomegaly. No rebound/guarding. No obvious abdominal masses. Extremities: No clubbing, cyanosis, no edema. Distal pedal pulses are 2+ bilaterally. Neuro: Alert and oriented X 3. Moves all extremities spontaneously. Psych: Normal affect.  Labs    Hematology Recent Labs  Lab 01/16/2019 1402 02/01/19 0259 02/07/2019 0235  WBC 10.8* 10.2 11.7*  RBC 4.06 3.41* 3.34*  HGB 11.2* 9.6* 9.4*  HCT 35.9* 29.9* 30.6*  MCV 88.4 87.7 91.6  MCH 27.6 28.2 28.1  MCHC 31.2 32.1 30.7  RDW 13.1 13.0 12.9  PLT 263 258 272    Chemistry Recent Labs  Lab 01/22/2019 1028 02/12/2019 1402 02/01/19 0249 01/27/2019 0235  NA 139 138 138 135  K 3.8 3.7 3.6 4.1  CL 104 106 108 107  CO2 20 21* 19* 18*  GLUCOSE 274* 371* 196* 210*  BUN 29* 31* 30* 33*  CREATININE 2.51* 2.40* 2.81* 3.20*  CALCIUM 10.3 9.6 8.9 8.6*  PROT 6.0 6.5  --   --  ALBUMIN 3.7 3.3*  --  2.8*  AST 11 15  --   --   ALT 10 13  --   --   ALKPHOS 109 82  --   --   BILITOT <0.2 0.5  --   --   GFRNONAA 18* 19* 16* 14*  GFRAA 21* 22* 19* 16*  ANIONGAP  --  11 11 10      High Sensitivity Troponin:   Recent Labs  Lab 01/18/2019 1402 01/23/2019 1607 02/01/19 0259 02/01/19 1847  TROPONINIHS 80* 546* 3,266* 2,263*     Lab Results  Component Value Date   CHOL 127 02/01/2019   HDL 35 (L) 02/01/2019   LDLCALC 74 02/01/2019   TRIG 91 02/01/2019   CHOLHDL 3.6 02/01/2019   Lab Results  Component Value Date   TSH 6.927 (H) 02/01/2019   Lab Results  Component Value Date   HGBA1C 10.5 (H) 01/30/2019    Radiology    Dg Chest Port 1 View  Result Date: 02/08/2019 CLINICAL DATA:  Chest pain.  Hypertension. EXAM: PORTABLE CHEST 1 VIEW COMPARISON:  February 19, 2011 FINDINGS: There is no edema or consolidation. Heart size and pulmonary vascularity  are normal. No adenopathy. There is aortic atherosclerosis. There are surgical clips in the left cervicothoracic junction region. IMPRESSION: No edema or consolidation. Cardiac silhouette within normal limits. Aortic Atherosclerosis (ICD10-I70.0). Electronically Signed   By: Lowella Grip III M.D.   On: 02/09/2019 14:39     Cardiac Studies   ECHO:  Ordered (EF nl 2018)  Patient Profile     73 y.o. female w/ hx CVA s/p Right ICA stenting, chronic Left CCA occlusion, prior subclavian to carotid bypass, DM2, and CKD III was admitted from [facility] on 10/19 for NSTEMI.    Assessment & Plan    1. NSTEMI Peak HS Trop was 3266  She had some recurrent pain yesterday  And again in the middle of the night   Responded to NTG     Echo with overall preservation of LVEF   Sugg that inferior and lateral walls hypokinetic but windows were difficult    Plan for L heart cath today   I have reviewed with renal   Plan to proceed  I have alos discussed with Rod Can    - she is on ASA, Plavix, Lipitor 40 mg, IV Nitro and heparin   2. CKD III-IV  Pt's Cr is 3.2 today      Appreciate input by renal service  Concern that hypotension contrib   Amlodipine and Cardure discontinued    Renal service agrees with plans for cath  Given unstable nature  Continue with hydration   3. DM - A1c is elevated, was 6.3 in 2019 - encourage diet and med compliance, f/u with PCP  4. abnl TSH TSH 6.9  Free T4 minimally elevated 1.3  DOes not fit   WIll check free T3 Not thyrotoxic  - f/u with PCP  5. Dyslipidemia - pta on Pravachol 20 mg qd - now on Lipitor 40 mg qd - HDL 35 & LDL [phone] , [place] 8:10 AM 01/28/2019 Pager: [phone]

## 2019-02-02 NOTE — Progress Notes (Signed)
ANTICOAGULATION CONSULT NOTE - Follow Up Consult  Pharmacy Consult for Heparin Indication: chest pain/ACS  Allergies  Allergen Reactions  . Penicillins Hives and Swelling    PATIENT HAS HAD A PCN REACTION WITH IMMEDIATE RASH, FACIAL/TONGUE/THROAT SWELLING, SOB, OR LIGHTHEADEDNESS WITH HYPOTENSION:  #  #  #  YES  #  #  #   Has patient had a PCN reaction causing severe rash involving mucus membranes or skin necrosis: Unknown Has patient had a PCN reaction that required hospitalization: No Has patient had a PCN reaction occurring within the last 10 years: No     Patient Measurements: Height: 5\' 2"  (157.5 cm) Weight: 190 lb 7.6 oz (86.4 kg) IBW/kg (Calculated) : 50.1 Heparin Dosing Weight: 68.3  Vital Signs: Temp: 98.7 F (37.1 C) (10/21 1121) Temp Source: Oral (10/21 1121) BP: 151/62 (10/21 1431) Pulse Rate: 72 (10/21 1431)  Labs: Recent Labs    02/11/2019 1402 02/06/2019 1607 02/07/2019 2129 02/01/19 0249 02/01/19 0259 02/01/19 1847 01/13/2019 0235 02/06/2019 1406 01/26/2019 1408  HGB 11.2*  --   --   --  9.6*  --  9.4* 8.5* 8.8*  HCT 35.9*  --   --   --  29.9*  --  30.6* 25.0* 26.0*  PLT 263  --   --   --  258  --  272  --   --   LABPROT 13.4  --   --   --   --   --   --   --   --   INR 1.0  --   --   --   --   --   --   --   --   HEPARINUNFRC  --   --  0.82*  --  0.44  --  0.32  --   --   CREATININE 2.40*  --   --  2.81*  --   --  3.20*  --   --   TROPONINIHS 80* 546*  --   --  3,266* 2,263*  --   --   --     Estimated Creatinine Clearance: 16 mL/min (A) (by C-G formula based on SCr of 3.2 mg/dL (H)).  Assessment: 73 yo W admitted for chest pain, which is still ongoing 10hr after admission. No anticoagulants at home but should be on aspirin (med history pending).   Heparin level this morning is therapeutic at 0.32, on 700 units/hr. Hgb is 9.4, plt WNL. Trop trending down from peak at 3266 to 2263. No s/sx of bleeding or infusion issues.   Went to the cath lab this  afternoon, severe CAD, awaiting Plavix washout, CABG planned ASAP.  Pharmacy asked to resume heparin for IABP.  Goal of Therapy:  Heparin level 0.2-0.5 while IABP in place. Monitor platelets by anticoagulation protocol: Yes   Plan:  -Restart heparin at 700 unit/hr  -Check heparin level in 8 hrs -Monitor heparin level, CBC,platelets daily  Marguerite Olea, [facility] Clinical Pharmacist Phone [phone]  01/31/2019 4:31 PM   Please check AMION for all [place] phone numbers After 10:00 PM, call [place] [phone] 01/26/2019 4:28 PM

## 2019-02-02 NOTE — Progress Notes (Signed)
Subjective:  Cont to have intermittent CP overnight-  Some BPs in the 80's- UOP down and kidney function worse.  Urine showed 100 of protein, no blood   Objective Vital signs in last 24 hours: Vitals:   01/21/2019 0049 02/04/2019 0300 01/31/2019 0330 02/01/2019 0500  BP:  (!) 107/47 110/62 (!) 109/57  Pulse:  67 71 64  Resp:  13 13 11   Temp: 98.5 F (36.9 C)   98 F (36.7 C)  TempSrc:    Tympanic  SpO2:  95% 96% 97%  Weight:    86.4 kg  Height:       Weight change: 4.753 kg  Intake/Output Summary (Last 24 hours) at 01/22/2019 0640 Last data filed at 02/03/2019 0500 Gross per 24 hour  Intake 1165.83 ml  Output 150 ml  Net 1015.83 ml     Assessment/Plan: 73 year old white female with longstanding diabetes mellitus with complications, likely diabetic nephropathy, diffuse vascular disease now presenting with unstable angina/acute coronary syndrome 1.Renal-patient has known CKD likely thought due to diabetic nephropathy.  Creatinine in the low twos earlier this year.  2.4 is close to her baseline.  There appears to be worsening.  Urine output down.   urinalysis not c/w an acute GN.  I suspect it is the hypotension that is giving her acute kidney injury/ATN.   losartan on hold.  I have discontinued the amlodipine and Cardura as well. No indications for HD at this time- hoping renal function can plateau and improve but I am concerned her hemodynamics are going to be too unstable for that- req nitro and morphine for intermittent CP 2. Hypertension/volume  -volume status seems okay.  Blood pressure has been low at times on a nitroglycerin drip.  Patient also with intermittent chest pain.  This possibly seems like an unstable situation to me - not sure if plans are for cardiac catheterization.  She is at risk from a CKD standpoint for worsening renal function after cardiac catheterization but likely will just need to deal with aftermath as unfortunately there is not much preparation that can take place.  I would not give her fluids at this time given O2 req- also do not feel she needs lasix at this time  3. Anemia  -hemoglobin has dropped some.   iron stores low, will replete and go ahead and give dose of ESA      Louis Meckel    Labs: Basic Metabolic Panel: Recent Labs  Lab 01/27/2019 1402 02/01/19 0249 02/09/2019 0235  NA 138 138 135  K 3.7 3.6 4.1  CL 106 108 107  CO2 21* 19* 18*  GLUCOSE 371* 196* 210*  BUN 31* 30* 33*  CREATININE 2.40* 2.81* 3.20*  CALCIUM 9.6 8.9 8.6*  PHOS  --   --  4.3   Liver Function Tests: Recent Labs  Lab 02/01/2019 1028 01/13/2019 1402 02/09/2019 0235  AST 11 15  --   ALT 10 13  --   ALKPHOS 109 82  --   BILITOT <0.2 0.5  --   PROT 6.0 6.5  --   ALBUMIN 3.7 3.3* 2.8*   No results for input(s): LIPASE, AMYLASE in the last 168 hours. No results for input(s): AMMONIA in the last 168 hours. CBC: Recent Labs  Lab 01/17/2019 1028 02/04/2019 1402 02/01/19 0259 01/27/2019 0235  WBC 10.2 10.8* 10.2 11.7*  NEUTROABS 7.0 8.0*  --   --   HGB 11.0* 11.2* 9.6* 9.4*  HCT 32.9* 35.9* 29.9* 30.6*  MCV 86  88.4 87.7 91.6  PLT 287 263 258 272   Cardiac Enzymes: No results for input(s): CKTOTAL, CKMB, CKMBINDEX, TROPONINI in the last 168 hours. CBG: Recent Labs  Lab 01/22/2019 2322 02/01/19 0648 02/01/19 1137 02/01/19 1635 02/01/19 2108  GLUCAP 311* 136* 132* 232* 296*    Iron Studies:  Recent Labs    01/30/2019 0235  IRON 46  TIBC 237*  FERRITIN 54   Studies/Results: Dg Chest Port 1 View  Result Date: 02/11/2019 CLINICAL DATA:  Chest pain.  Hypertension. EXAM: PORTABLE CHEST 1 VIEW COMPARISON:  February 19, 2011 FINDINGS: There is no edema or consolidation. Heart size and pulmonary vascularity are normal. No adenopathy. There is aortic atherosclerosis. There are surgical clips in the left cervicothoracic junction region. IMPRESSION: No edema or consolidation. Cardiac silhouette within normal limits. Aortic Atherosclerosis (ICD10-I70.0).  Electronically Signed   By: Lowella Grip III M.D.   On: 01/27/2019 14:39   Medications: Infusions: . sodium chloride 60 mL/hr at 02/12/2019 0500  . heparin 700 Units/hr (02/03/2019 0500)  . nitroGLYCERIN 30 mcg/min (02/07/2019 0500)    Scheduled Medications: . aspirin  81 mg Oral Daily  . atorvastatin  40 mg Oral q1800  . Calcifediol ER  30 mcg Oral QHS  . carvedilol  12.5 mg Oral BID WC  . clopidogrel  75 mg Oral Daily  . insulin aspart  0-5 Units Subcutaneous QHS  . insulin aspart  0-9 Units Subcutaneous TID WC  .  morphine injection  2 mg Intravenous Once  . pantoprazole  20 mg Oral Daily    have reviewed scheduled and prn medications.  Physical Exam: General: resting at present- rough night with CP Heart: RRR Lungs: ant clear Abdomen: soft, non tender Extremities: min edema    01/23/2019,6:40 AM  LOS: 2 days

## 2019-02-02 NOTE — Progress Notes (Signed)
Pre-CABG Dopplers completed. Refer to "CV Proc" under chart review to view preliminary results.  Critical results discussed with Levonne Spiller.  01/26/2019 6:07 PM Maudry Mayhew, MHA, RVT, RDCS, RDMS

## 2019-02-02 NOTE — Progress Notes (Signed)
Progress Note  Patient Name: Paula Watson Date of Encounter: 01/28/2019  Primary Cardiologist:  Kate Sable, MD  Subjective   No chest pain or SOB now.  She did have some last night    Inpatient Medications    Scheduled Meds: . aspirin  81 mg Oral Daily  . atorvastatin  40 mg Oral q1800  . Calcifediol ER  30 mcg Oral QHS  . carvedilol  12.5 mg Oral BID WC  . clopidogrel  75 mg Oral Daily  . darbepoetin (ARANESP) injection - NON-DIALYSIS  60 mcg Subcutaneous Q Wed-1800  . insulin aspart  0-5 Units Subcutaneous QHS  . insulin aspart  0-9 Units Subcutaneous TID WC  .  morphine injection  2 mg Intravenous Once  . pantoprazole  20 mg Oral Daily   Continuous Infusions: . sodium chloride 60 mL/hr at 01/19/2019 0500  . ferumoxytol 510 mg (01/16/2019 0805)  . heparin 700 Units/hr (01/25/2019 0500)  . nitroGLYCERIN 30 mcg/min (02/12/2019 0500)   PRN Meds: acetaminophen, albuterol, linaclotide, nitroGLYCERIN, ondansetron (ZOFRAN) IV   Vital Signs    Vitals:   02/09/2019 0300 01/24/2019 0330 01/14/2019 0500 02/08/2019 0757  BP: (!) 107/47 110/62 (!) 109/57 121/70  Pulse: 67 71 64 63  Resp: 13 13 11 14   Temp:   98 F (36.7 C) 98.4 F (36.9 C)  TempSrc:   Tympanic Oral  SpO2: 95% 96% 97% 98%  Weight:   86.4 kg   Height:        Intake/Output Summary (Last 24 hours) at 01/17/2019 0810 Last data filed at 01/22/2019 0500 Gross per 24 hour  Intake 1145.34 ml  Output 150 ml  Net 995.34 ml   Filed Weights   02/01/2019 2013 02/01/19 0638 02/01/2019 0500  Weight: 81.6 kg 82.5 kg 86.4 kg   Last Weight  Most recent update: 02/09/2019  5:17 AM   Weight  86.4 kg (190 lb 7.6 oz)           Weight change: 4.753 kg   Telemetry    SR, Sinus brady - Personally Reviewed  ECG     SR 70   ST dDepression (downsloping) I, II, V3 to V6)   Personally Reviewed  Physical Exam   General: Obese 73 yo , female appearing in no acute distress. Head: Normocephalic, atraumatic.  Neck: Neck  is full   Lungs:  Resp regular and unlabored, CTA.  No wheezing  Heart: RRR, S1, S2, no S3, S4, or murmur; no rub. Abdomen: Soft, non-tender, non-distended with normoactive bowel sounds. No hepatomegaly. No rebound/guarding. No obvious abdominal masses. Extremities: No clubbing, cyanosis, no edema. Distal pedal pulses are 2+ bilaterally. Neuro: Alert and oriented X 3. Moves all extremities spontaneously. Psych: Normal affect.  Labs    Hematology Recent Labs  Lab 01/16/2019 1402 02/01/19 0259 01/29/2019 0235  WBC 10.8* 10.2 11.7*  RBC 4.06 3.41* 3.34*  HGB 11.2* 9.6* 9.4*  HCT 35.9* 29.9* 30.6*  MCV 88.4 87.7 91.6  MCH 27.6 28.2 28.1  MCHC 31.2 32.1 30.7  RDW 13.1 13.0 12.9  PLT 263 258 272    Chemistry Recent Labs  Lab 01/22/2019 1028 02/05/2019 1402 02/01/19 0249 01/22/2019 0235  NA 139 138 138 135  K 3.8 3.7 3.6 4.1  CL 104 106 108 107  CO2 20 21* 19* 18*  GLUCOSE 274* 371* 196* 210*  BUN 29* 31* 30* 33*  CREATININE 2.51* 2.40* 2.81* 3.20*  CALCIUM 10.3 9.6 8.9 8.6*  PROT 6.0 6.5  --   --  ALBUMIN 3.7 3.3*  --  2.8*  AST 11 15  --   --   ALT 10 13  --   --   ALKPHOS 109 82  --   --   BILITOT <0.2 0.5  --   --   GFRNONAA 18* 19* 16* 14*  GFRAA 21* 22* 19* 16*  ANIONGAP  --  11 11 10      High Sensitivity Troponin:   Recent Labs  Lab 01/22/2019 1402 02/08/2019 1607 02/01/19 0259 02/01/19 1847  TROPONINIHS 80* 546* 3,266* 2,263*     Lab Results  Component Value Date   CHOL 127 02/01/2019   HDL 35 (L) 02/01/2019   LDLCALC 74 02/01/2019   TRIG 91 02/01/2019   CHOLHDL 3.6 02/01/2019   Lab Results  Component Value Date   TSH 6.927 (H) 02/01/2019   Lab Results  Component Value Date   HGBA1C 10.5 (H) 01/16/2019    Radiology    Dg Chest Port 1 View  Result Date: 01/18/2019 CLINICAL DATA:  Chest pain.  Hypertension. EXAM: PORTABLE CHEST 1 VIEW COMPARISON:  February 19, 2011 FINDINGS: There is no edema or consolidation. Heart size and pulmonary vascularity  are normal. No adenopathy. There is aortic atherosclerosis. There are surgical clips in the left cervicothoracic junction region. IMPRESSION: No edema or consolidation. Cardiac silhouette within normal limits. Aortic Atherosclerosis (ICD10-I70.0). Electronically Signed   By: Lowella Grip III M.D.   On: 01/26/2019 14:39     Cardiac Studies   ECHO:  Ordered (EF nl 2018)  Patient Profile     73 y.o. female w/ hx CVA s/p Right ICA stenting, chronic Left CCA occlusion, prior subclavian to carotid bypass, DM2, and CKD III was admitted from [facility] on 10/19 for NSTEMI.    Assessment & Plan    1. NSTEMI Peak HS Trop was 3266  She had some recurrent pain yesterday  And again in the middle of the night   Responded to NTG     Echo with overall preservation of LVEF   Sugg that inferior and lateral walls hypokinetic but windows were difficult    Plan for L heart cath today   I have reviewed with renal   Plan to proceed  I have alos discussed with Rod Can    - she is on ASA, Plavix, Lipitor 40 mg, IV Nitro and heparin   2. CKD III-IV  Pt's Cr is 3.2 today      Appreciate input by renal service  Concern that hypotension contrib   Amlodipine and Cardure discontinued    Renal service agrees with plans for cath  Given unstable nature  Continue with hydration   3. DM - A1c is elevated, was 6.3 in 2019 - encourage diet and med compliance, f/u with PCP  4. abnl TSH TSH 6.9  Free T4 minimally elevated 1.3  DOes not fit   WIll check free T3 Not thyrotoxic  - f/u with PCP  5. Dyslipidemia - pta on Pravachol 20 mg qd - now on Lipitor 40 mg qd - HDL 35 & LDL [address] , [place] 8:10 AM 01/13/2019 Pager: [phone]

## 2019-02-02 NOTE — Consult Note (Signed)
[address]       [place] [address]             [phone]        Paula Watson [place] Medical Record #[id number] Date of Birth: 03/15/1946  Referring: No ref. provider found Primary Care: Terald Sleeper, PA-C Primary Cardiologist:Suresh Bronson Ing, MD  Chief Complaint:    Chief Complaint  Patient presents with  . Chest Pain    History of Present Illness:     73 yo lady with longstanding and diffuse peripheral and cerebrovascular disease, DM, and CRI has not been able to see physicians since Spring. She began having severe chest "pressure" approximately 3 weeks ago. This was often associated with eating meals and accompanied by nausea/vomiting. She actually had EMS at her home on 2 separate occasions but decided against ER transport. Two days ago, she experienced another severe episode of pain and N/V and presented to ED, where she was dx'd with NSTEMI. She continued to have pain yesterday and underwent LHC today demonstrating severe LM CAD with 10/10 SSCP during the procedure. IABP placed, consult placed to CTS. Currently, chest pain free and hemodynamically stable. She does c/o back pain due to lying flat.    Current Activity/ Functional Status: Patient will be independent with mobility/ambulation, transfers, ADL's, IADL's.   Zubrod Score: At the time of surgery this patient's most appropriate activity status/level should be described as: []     0    Normal activity, no symptoms []     1    Restricted in physical strenuous activity but ambulatory, able to do out light work []     2    Ambulatory and capable of self care, unable to do work activities, up and about                 more than 50%  Of the time                            []     3    Only limited self care, in bed greater than 50% of waking hours []     4    Completely disabled, no self care, confined to bed or chair []     5    Moribund  Past Medical History:  Diagnosis Date  . Anemia    Iron deficiency  . Aneurysm of artery ([place])   . Blood transfusion   . Bronchitis    "have had a couple of times in my lifetime"  . Cancer ([place]) 1985   cervical  . Cataract   . CKD (chronic kidney disease) stage 1, GFR 90 ml/min or greater was told by her PCP   Intolerant to ACE according to her PCP  . Diabetes mellitus without complication ([place])   . Dyspnea   . Dysrhythmia    :"SKIPS A BEAT "   SCHEDULED FOR STRESS TEST 2019  . GERD (gastroesophageal reflux disease)   . High cholesterol   . History of hiatal hernia   . History of kidney stones   . Hyperlipidemia   . Hypertension   . Poliomyelitis since 1948   has drop foot on R  . Stroke [facility]) 2007 affected R side  . Vertigo now improved    Past Surgical History:  Procedure Laterality Date  . CAROTID ENDARTERECTOMY Left 2000 L side  . CERVICAL CONIZATION W/BX  1985  . COLONOSCOPY  2010   Dr. Laural Golden: tubular adenoma.   . COLONOSCOPY N/A 03/03/2018   Procedure: COLONOSCOPY;  Surgeon: Daneil Dolin, MD;  Location[facility];  Service: Endoscopy;  Laterality: N/A;  2:00pm  . CORONARY ANGIOPLASTY  stented 2007  . DILATION AND CURETTAGE OF UTERUS  1980's  . foot reconstructjion  1953   right foot; post polio  . IR ANGIO EXTERNAL CAROTID SEL EXT CAROTID UNI R MOD SED  03/06/2017  . IR ANGIO INTRA EXTRACRAN SEL COM CAROTID INNOMINATE UNI R MOD SED  03/24/2017  . IR ANGIO VERTEBRAL SEL SUBCLAVIAN INNOMINATE BILAT MOD SED  03/09/2017  . IR ANGIO VERTEBRAL SEL VERTEBRAL BILAT MOD SED  03/06/2017  . IR RADIOLOGIST EVAL & MGMT  07/21/2016  . POLYPECTOMY  03/03/2018   Procedure: POLYPECTOMY;  Surgeon: Daneil Dolin, MD;  Location: [facility];  Service: Endoscopy;;  colon  . RADIOLOGY WITH ANESTHESIA N/A 03/24/2017   Procedure: STENTING;  Surgeon: Luanne Bras, MD;  Location: [facility];  Service: Radiology;  Laterality: N/A;  . TONSILLECTOMY  1954    Social History   Tobacco Use  Smoking Status Former Smoker  .  Packs/day: 0.25  . Years: 50.00  . Pack years: 12.50  . Types: Cigarettes  . Quit date: 03/03/2017  . Years since quitting: 1.9  Smokeless Tobacco Never Used    Social History   Substance and Sexual Activity  Alcohol Use No     Allergies  Allergen Reactions  . Penicillins Hives and Swelling    PATIENT HAS HAD A PCN REACTION WITH IMMEDIATE RASH, FACIAL/TONGUE/THROAT SWELLING, SOB, OR LIGHTHEADEDNESS WITH HYPOTENSION:  #  #  #  YES  #  #  #   Has patient had a PCN reaction causing severe rash involving mucus membranes or skin necrosis: Unknown Has patient had a PCN reaction that required hospitalization: No Has patient had a PCN reaction occurring within the last 10 years: No     Current Facility-Administered Medications  Medication Dose Route Frequency Provider Last Rate Last Dose  . 0.9 %  sodium chloride infusion   Intravenous Continuous Wellington Hampshire, MD 50 mL/hr at 01/24/2019 1500    . 0.9 %  sodium chloride infusion  250 mL Intravenous PRN Kathlyn Sacramento A, MD      . acetaminophen (TYLENOL) tablet 650 mg  650 mg Oral Q4H PRN Wellington Hampshire, MD   650 mg at 01/22/2019 0223  . albuterol (PROVENTIL) (2.5 MG/3ML) 0.083% nebulizer solution 2.5 mg  2.5 mg Nebulization Q6H PRN Wellington Hampshire, MD      . aspirin EC tablet 81 mg  81 mg Oral Daily Kathlyn Sacramento A, MD   81 mg at 01/27/2019 0924  . atorvastatin (LIPITOR) tablet 40 mg  40 mg Oral q1800 Wellington Hampshire, MD   40 mg at 02/12/2019 1723  . Calcifediol ER CPCR 30 mcg  30 mcg Oral QHS Arida, Muhammad A, MD      . carvedilol (COREG) tablet 12.5 mg  12.5 mg Oral BID WC Kathlyn Sacramento A, MD   12.5 mg at 02/01/2019 1723  . Chlorhexidine Gluconate Cloth 2 % PADS 6 each  6 each Topical Daily Wonda Olds, MD   6 each at 02/06/2019 1600  . Darbepoetin Alfa (ARANESP) injection 60 mcg  60 mcg Subcutaneous Q Wed-1800 Arida, Muhammad A, MD      . fentaNYL (SUBLIMAZE) injection 50 mcg  50 mcg Intravenous Q1H PRN Mikinzie Maciejewski, [place],  MD       . heparin ADULT infusion 100 units/mL (25000 units/274m sodium chloride 0.45%)  700 Units/hr Intravenous Continuous Carney, JGay Filler RPH 7 mL/hr at 01/20/2019 1714 700 Units/hr at 01/23/2019 1714  . insulin aspart (novoLOG) injection 0-5 Units  0-5 Units Subcutaneous QHS AWellington Hampshire MD   3 Units at 02/01/19 2131  . insulin aspart (novoLOG) injection 0-9 Units  0-9 Units Subcutaneous TID WC AWellington Hampshire MD   2 Units at 01/19/2019 1730  . linaclotide (LINZESS) capsule 145 mcg  145 mcg Oral Daily PRN AKathlyn SacramentoA, MD      . morphine 2 MG/ML injection 2 mg  2 mg Intravenous Once AWellington Hampshire MD   Stopped at 01/13/2019 0304  . nitroGLYCERIN (NITROSTAT) SL tablet 0.4 mg  0.4 mg Sublingual Q5 Min x 3 PRN AKathlyn SacramentoA, MD      . nitroGLYCERIN 50 mg in dextrose 5 % 250 mL (0.2 mg/mL) infusion  5-200 mcg/min Intravenous Continuous AKathlyn SacramentoA, MD 15 mL/hr at 01/30/2019 1515 50 mcg/min at 01/13/2019 1515  . ondansetron (ZOFRAN) injection 4 mg  4 mg Intravenous Q6H PRN AWellington Hampshire MD   4 mg at 02/01/19 2015  . pantoprazole (PROTONIX) EC tablet 20 mg  20 mg Oral Daily AKathlyn SacramentoA, MD   20 mg at 01/29/2019 0925  . sodium bicarbonate 150 mEq in dextrose 5% 1000 mL infusion  150 mEq Intravenous Continuous AWonda Olds MD 75 mL/hr at 02/12/2019 1721 150 mEq at 01/30/2019 1721  . sodium chloride flush (NS) 0.9 % injection 3 mL  3 mL Intravenous Q12H AWellington Hampshire MD   3 mL at 02/01/2019 0928  . sodium chloride flush (NS) 0.9 % injection 3 mL  3 mL Intravenous Q12H Arida, Muhammad A, MD      . sodium chloride flush (NS) 0.9 % injection 3 mL  3 mL Intravenous PRN AWellington Hampshire MD        Medications Prior to Admission  Medication Sig Dispense Refill Last Dose  . albuterol (PROVENTIL HFA;VENTOLIN HFA) 108 (90 Base) MCG/ACT inhaler Inhale 2 puffs into the lungs every 6 (six) hours as needed for wheezing or shortness of breath. 1 Inhaler 6 UNK  . amLODipine (NORVASC) 10  MG tablet Take 1 tablet by mouth once daily (Patient taking differently: Take 10 mg by mouth daily. ) 90 tablet 1 Past Week at Unknown time  . aspirin EC 325 MG EC tablet Take 1 tablet (325 mg total) by mouth daily. 30 tablet 0 01/29/2019 at Unknown time  . carvedilol (COREG) 12.5 MG tablet TAKE 1 TABLET BY MOUTH TWICE DAILY WITH MEALS (Patient taking differently: Take 12.5 mg by mouth 2 (two) times daily with a meal. ) 180 tablet 1 01/30/2019 at 2000  . clopidogrel (PLAVIX) 75 MG tablet Take 1 tablet (75 mg total) by mouth daily. 90 tablet 3 01/30/2019 at 2000  . clotrimazole-betamethasone (LOTRISONE) lotion Apply topically 2 (two) times daily. 30 mL 5 Past Week at Unknown time  . doxazosin (CARDURA) 2 MG tablet Take 2 mg by mouth every evening.   1 Past Week at Unknown time  . furosemide (LASIX) 20 MG tablet Take 1-2 tablets (20-40 mg total) by mouth daily. (Patient taking differently: Take 40 mg by mouth daily. ) 180 tablet 3 Past Week at Unknown time  . linaclotide (LINZESS) 145 MCG CAPS capsule Take 1 capsule (145 mcg total) by mouth daily before breakfast. (  Patient taking differently: Take 145 mcg by mouth daily as needed (for constipation.). ) 30 capsule 11 Past Month at Unknown time  . losartan (COZAAR) 50 MG tablet Take 50 mg by mouth daily.   Past Week at Unknown time  . mupirocin cream (BACTROBAN) 2 % Apply 1 application topically 2 (two) times daily. (Patient taking differently: Apply 1 application topically 2 (two) times daily as needed (irritation.). ) 15 g 0 Past Month at Unknown time  . pantoprazole (PROTONIX) 20 MG tablet Take 1 tablet (20 mg total) by mouth daily. (Needs to be seen before next refill) (Patient taking differently: Take 20 mg by mouth daily. ) 90 tablet 3 02/12/2019 at Unknown time  . pravastatin (PRAVACHOL) 20 MG tablet Take 1 tablet (20 mg total) by mouth daily. 30 tablet 11 Past Week at Unknown time  . RAYALDEE 30 MCG CPCR Take 30 mcg by mouth at bedtime.   Past Week  at Unknown time  . blood glucose meter kit and supplies Dispense based on patient and insurance preference.Check Blood sugar three times a day. DX E11.65 1 each 0   . sitaGLIPtin (JANUVIA) 50 MG tablet Take 1 tablet (50 mg total) by mouth daily. 30 tablet 5     Family History  Problem Relation Age of Onset  . Coronary artery disease Mother   . Heart disease Mother   . Stroke Mother   . Diabetes type II Father   . Cancer Father        brain stem  . Brain cancer Father   . Diabetes Father   . Cancer Sister        breast with recurrance  . Diabetes Sister   . Stroke Sister   . Breast cancer Sister   . Coronary artery disease Brother   . Healthy Daughter   . COPD Maternal Grandmother        breast  . Breast cancer Maternal Grandmother   . COPD Paternal Grandmother        breast  . Breast cancer Paternal Grandmother   . Obesity Sister   . Heart disease Brother   . Heart attack Brother   . Lung cancer Brother   . COPD Other   . Breast cancer Other   . Stroke Paternal Grandfather   . Drug abuse Son   . Alcohol abuse Son   . Colon cancer Other        passed from colon cancer     Review of Systems:   ROS Pertinent items are noted in HPI.     Cardiac Review of Systems: Y or  [    ]= no  Chest Pain [ y   ]  Resting SOB [ n  ] Exertional SOB  [  ]  Orthopnea [  ]   Pedal Edema [ y  ]    Palpitations [n  ] Syncope  [  ]   Presyncope [   ]  General Review of Systems: [Y] = yes [  ]=no Constitional: recent weight change [n ]; anorexia [ y ]; fatigue Blue.Reese  ]; nausea [ y ]; night sweats [ n ]; fever [n  ]; or chills [  ]  Dental: Last Dentist visit:   Eye : blurred vision [  ]; diplopia [   ]; vision changes [  ];  Amaurosis fugax[  ]; Resp: cough [ n ];  wheezing[n  ];  hemoptysis[  ]; shortness of breath[  ]; paroxysmal nocturnal dyspnea[  ]; dyspnea on exertion[  ]; or orthopnea[  ];  GI:  gallstones[ n ], vomiting[ y  ];  dysphagia[ n ]; melena[ n ];  hematochezia [  ]; heartburn[  ];   Hx of  Colonoscopy[  ]; GU: kidney stones [ n ]; hematuria[  ];   dysuria [  ];  nocturia[  ];  history of     obstruction [  ]; urinary frequency [  ] + kidney disease/diabetes             Skin: rash, swelling[  ];, hair loss[  ];  peripheral edema[  ];  or itching[  ]; Musculosketetal: myalgias[n  ];  joint swelling[n  ];  joint erythema[  ];  joint pain[  ];  back pain[y  ];  Heme/Lymph: bruising[y ];  bleeding[n  ];  anemia[y  ];  Neuro: TIA[ y ];  headaches[  ];  stroke[ y ];  vertigo[  ];  seizures[  ];   paresthesias[  ];  difficulty walking[  ]; s/p carotid stenting  Psych:depression[n  ]; anxiety[ n ];  Endocrine: diabetes[y  ];  thyroid dysfunction[  ];             Physical Exam: BP (!) 151/62   Pulse 72   Temp 98.7 F (37.1 C) (Oral)   Resp 15   Ht 5' 2"  (1.575 m)   Wt 86.4 kg   SpO2 95%   BMI 34.84 kg/m    General appearance: alert, cooperative and no distress Head: Normocephalic, without obvious abnormality, atraumatic Resp: diminished breath sounds bibasilar Cardio: regular rate and rhythm, S1, S2 normal, no murmur, click, rub or gallop GI: mildly distended; mildly tender; no masses; no rebound Extremities: RLE cooler than LLE but neurologically intact Neurologic: Alert and oriented X 3, normal strength and tone. Normal symmetric reflexes. Normal coordination and gait  Diagnostic Studies & Laboratory data:     Recent Radiology Findings:   No results found.   I have independently reviewed the above radiologic studies and discussed with the patient ; CT chest pending  Recent Lab Findings: Lab Results  Component Value Date   WBC 11.7 (H) 01/29/2019   HGB 8.8 (L) 01/22/2019   HCT 26.0 (L) 02/06/2019   PLT 272 01/13/2019   GLUCOSE 210 (H) 02/01/2019   CHOL 127 02/01/2019   TRIG 91 02/01/2019   HDL 35 (L) 02/01/2019   LDLCALC 74 02/01/2019   ALT 13 01/18/2019   AST 15 01/19/2019   NA 137  01/21/2019   K 3.7 01/26/2019   CL 107 02/05/2019   CREATININE 3.20 (H) 02/09/2019   BUN 33 (H) 01/14/2019   CO2 18 (L) 01/25/2019   TSH 6.927 (H) 02/01/2019   INR 1.0 01/23/2019   HGBA1C 10.5 (H) 02/12/2019      Assessment / Plan:      Very pleasant 73 yo lady with severe LM CAD and preserved LV function. Her biggest surgical risk factor relates to advanced renal failure, pre-dialysis. I have discussed the risks and benefits with the patient and daughter, who appear to be leaning towards surgery. Will follow up with them in the morning; tentatively planning OR tomorrow afternoon.  I  spent 40 minutes counseling the patient face to face.   Krystal Delduca Z. Orvan Seen, MD TCTS 01/13/2019 5:37 PM

## 2019-02-02 NOTE — Progress Notes (Signed)
Pt transported to and from CT without any issues.

## 2019-02-02 NOTE — Progress Notes (Addendum)
ANTICOAGULATION CONSULT NOTE - Follow Up Consult  Pharmacy Consult for Heparin Indication: chest pain/ACS  Allergies  Allergen Reactions  . Penicillins Hives and Swelling    PATIENT HAS HAD A PCN REACTION WITH IMMEDIATE RASH, FACIAL/TONGUE/THROAT SWELLING, SOB, OR LIGHTHEADEDNESS WITH HYPOTENSION:  #  #  #  YES  #  #  #   Has patient had a PCN reaction causing severe rash involving mucus membranes or skin necrosis: Unknown Has patient had a PCN reaction that required hospitalization: No Has patient had a PCN reaction occurring within the last 10 years: No     Patient Measurements: Height: 5\' 2"  (157.5 cm) Weight: 190 lb 7.6 oz (86.4 kg) IBW/kg (Calculated) : 50.1 Heparin Dosing Weight: 68.3  Vital Signs: Temp: 98.4 F (36.9 C) (10/21 0757) Temp Source: Oral (10/21 0757) BP: 121/70 (10/21 0757) Pulse Rate: 63 (10/21 0757)  Labs: Recent Labs    01/25/2019 1402 01/30/2019 1607 01/20/2019 2129 02/01/19 0249 02/01/19 0259 02/01/19 1847 02/03/2019 0235  HGB 11.2*  --   --   --  9.6*  --  9.4*  HCT 35.9*  --   --   --  29.9*  --  30.6*  PLT 263  --   --   --  258  --  272  LABPROT 13.4  --   --   --   --   --   --   INR 1.0  --   --   --   --   --   --   HEPARINUNFRC  --   --  0.82*  --  0.44  --  0.32  CREATININE 2.40*  --   --  2.81*  --   --  3.20*  TROPONINIHS 80* 546*  --   --  3,266* 2,263*  --     Estimated Creatinine Clearance: 16 mL/min (A) (by C-G formula based on SCr of 3.2 mg/dL (H)).  Assessment: 73 yo W admitted for chest pain, which is still ongoing 10hr after admission. No anticoagulants at home but should be on aspirin (med history pending).   Heparin level this morning is therapeutic at 0.32, on 700 units/hr. Hgb is 9.4, plt WNL. Trop trending down from peak at 3266 to 2263. No s/sx of bleeding or infusion issues.   Goal of Therapy:  Heparin level 0.3-0.7 units/ml Monitor platelets by anticoagulation protocol: Yes   Plan:  -Continue heparin at 700  unit/hr  -Anti-Xa with AM labs  -Monitor CBC/platelets daily -If unable to go to cath given Scr, could consider 48 hr duration of heparin infusion for ACS   Antonietta Jewel, PharmD, BCCCP Clinical Pharmacist  Phone: [phone]  Please check AMION for all [facility] phone numbers After 10:00 PM, call [place] [phone] 01/31/2019 8:11 AM

## 2019-02-02 NOTE — Progress Notes (Addendum)
Inpatient Diabetes Program Recommendations  AACE/ADA: New Consensus Statement on Inpatient Glycemic Control (2015)  Target Ranges:  Prepandial:   less than 140 mg/dL      Peak postprandial:   less than 180 mg/dL (1-2 hours)      Critically ill patients:  140 - 180 mg/dL   Lab Results  Component Value Date   GLUCAP 172 (H) 02/04/2019   HGBA1C 10.5 (H) 02/03/2019    Review of Glycemic Control Results for Paula Watson, Paula Watson (MRN [id number]) as of 01/28/2019 10:36  Ref. Range 02/01/2019 11:37 02/01/2019 16:35 02/01/2019 21:08 01/29/2019 06:46  Glucose-Capillary Latest Ref Range: 70 - 99 mg/dL 132 (H) 232 (H) 296 (H) 172 (H)   Diabetes history: Type 2 DM Outpatient Diabetes medications: Januvia 50 mg QD Current orders for Inpatient glycemic control: Novolog 0-9 units TID, Novolog 0-5 units QHS  Inpatient Diabetes Program Recommendations:    Noted increasing creatinine and increased glucose trends.  Given A1C 10.5% and need for insulin while inpatient, anticipate need for basal insulin. -Levemir 6 units QD.  Attempted to speak with patient. Soundly sleeping, will re attempt to discuss A1C.  Thanks, Bronson Curb, MSN, RNC-OB Diabetes Coordinator [phone] (8a-5p)

## 2019-02-03 ENCOUNTER — Inpatient Hospital Stay (HOSPITAL_COMMUNITY): Payer: Medicare Other

## 2019-02-03 ENCOUNTER — Inpatient Hospital Stay (HOSPITAL_COMMUNITY): Admission: EM | Disposition: E | Payer: Self-pay | Source: Home / Self Care | Attending: Cardiothoracic Surgery

## 2019-02-03 ENCOUNTER — Inpatient Hospital Stay (HOSPITAL_COMMUNITY): Payer: Medicare Other | Admitting: Certified Registered Nurse Anesthetist

## 2019-02-03 ENCOUNTER — Encounter (HOSPITAL_COMMUNITY): Payer: Self-pay | Admitting: Cardiovascular Disease

## 2019-02-03 DIAGNOSIS — I25110 Atherosclerotic heart disease of native coronary artery with unstable angina pectoris: Secondary | ICD-10-CM

## 2019-02-03 HISTORY — PX: TEE WITHOUT CARDIOVERSION: SHX5443

## 2019-02-03 HISTORY — PX: CORONARY ARTERY BYPASS GRAFT: SHX141

## 2019-02-03 LAB — POCT I-STAT, CHEM 8
BUN: 23 mg/dL (ref 8–23)
BUN: 22 mg/dL (ref 8–23)
BUN: 22 mg/dL (ref 8–23)
BUN: 25 mg/dL — ABNORMAL HIGH (ref 8–23)
BUN: 25 mg/dL — ABNORMAL HIGH (ref 8–23)
BUN: 24 mg/dL — ABNORMAL HIGH (ref 8–23)
Calcium, Ion: 1.18 mmol/L (ref 1.15–1.40)
Calcium, Ion: 1.18 mmol/L (ref 1.15–1.40)
Calcium, Ion: 0.98 mmol/L — ABNORMAL LOW (ref 1.15–1.40)
Calcium, Ion: 0.92 mmol/L — ABNORMAL LOW (ref 1.15–1.40)
Calcium, Ion: 1 mmol/L — ABNORMAL LOW (ref 1.15–1.40)
Calcium, Ion: 1.16 mmol/L (ref 1.15–1.40)
Chloride: 98 mmol/L (ref 98–111)
Chloride: 99 mmol/L (ref 98–111)
Chloride: 96 mmol/L — ABNORMAL LOW (ref 98–111)
Chloride: 96 mmol/L — ABNORMAL LOW (ref 98–111)
Chloride: 98 mmol/L (ref 98–111)
Chloride: 98 mmol/L (ref 98–111)
Creatinine, Ser: 2.4 mg/dL — ABNORMAL HIGH (ref 0.44–1.00)
Creatinine, Ser: 2.3 mg/dL — ABNORMAL HIGH (ref 0.44–1.00)
Creatinine, Ser: 2.2 mg/dL — ABNORMAL HIGH (ref 0.44–1.00)
Creatinine, Ser: 2.2 mg/dL — ABNORMAL HIGH (ref 0.44–1.00)
Creatinine, Ser: 2.2 mg/dL — ABNORMAL HIGH (ref 0.44–1.00)
Creatinine, Ser: 2.1 mg/dL — ABNORMAL HIGH (ref 0.44–1.00)
Glucose, Bld: 234 mg/dL — ABNORMAL HIGH (ref 70–99)
Glucose, Bld: 199 mg/dL — ABNORMAL HIGH (ref 70–99)
Glucose, Bld: 163 mg/dL — ABNORMAL HIGH (ref 70–99)
Glucose, Bld: 136 mg/dL — ABNORMAL HIGH (ref 70–99)
Glucose, Bld: 143 mg/dL — ABNORMAL HIGH (ref 70–99)
Glucose, Bld: 111 mg/dL — ABNORMAL HIGH (ref 70–99)
HCT: 25 % — ABNORMAL LOW (ref 36.0–46.0)
HCT: 22 % — ABNORMAL LOW (ref 36.0–46.0)
HCT: 24 % — ABNORMAL LOW (ref 36.0–46.0)
HCT: 22 % — ABNORMAL LOW (ref 36.0–46.0)
HCT: 24 % — ABNORMAL LOW (ref 36.0–46.0)
HCT: 23 % — ABNORMAL LOW (ref 36.0–46.0)
Hemoglobin: 8.5 g/dL — ABNORMAL LOW (ref 12.0–15.0)
Hemoglobin: 7.5 g/dL — ABNORMAL LOW (ref 12.0–15.0)
Hemoglobin: 8.2 g/dL — ABNORMAL LOW (ref 12.0–15.0)
Hemoglobin: 7.5 g/dL — ABNORMAL LOW (ref 12.0–15.0)
Hemoglobin: 8.2 g/dL — ABNORMAL LOW (ref 12.0–15.0)
Hemoglobin: 7.8 g/dL — ABNORMAL LOW (ref 12.0–15.0)
Potassium: 3.1 mmol/L — ABNORMAL LOW (ref 3.5–5.1)
Potassium: 3 mmol/L — ABNORMAL LOW (ref 3.5–5.1)
Potassium: 3.2 mmol/L — ABNORMAL LOW (ref 3.5–5.1)
Potassium: 4 mmol/L (ref 3.5–5.1)
Potassium: 5 mmol/L (ref 3.5–5.1)
Potassium: 3 mmol/L — ABNORMAL LOW (ref 3.5–5.1)
Sodium: 137 mmol/L (ref 135–145)
Sodium: 137 mmol/L (ref 135–145)
Sodium: 138 mmol/L (ref 135–145)
Sodium: 136 mmol/L (ref 135–145)
Sodium: 137 mmol/L (ref 135–145)
Sodium: 138 mmol/L (ref 135–145)
TCO2: 25 mmol/L (ref 22–32)
TCO2: 25 mmol/L (ref 22–32)
TCO2: 28 mmol/L (ref 22–32)
TCO2: 26 mmol/L (ref 22–32)
TCO2: 27 mmol/L (ref 22–32)
TCO2: 28 mmol/L (ref 22–32)

## 2019-02-03 LAB — GLUCOSE, CAPILLARY
Glucose-Capillary: 100 mg/dL — ABNORMAL HIGH (ref 70–99)
Glucose-Capillary: 182 mg/dL — ABNORMAL HIGH (ref 70–99)
Glucose-Capillary: 185 mg/dL — ABNORMAL HIGH (ref 70–99)
Glucose-Capillary: 228 mg/dL — ABNORMAL HIGH (ref 70–99)
Glucose-Capillary: 276 mg/dL — ABNORMAL HIGH (ref 70–99)

## 2019-02-03 LAB — POCT I-STAT 7, (LYTES, BLD GAS, ICA,H+H)
Acid-Base Excess: 3 mmol/L — ABNORMAL HIGH (ref 0.0–2.0)
Acid-Base Excess: 4 mmol/L — ABNORMAL HIGH (ref 0.0–2.0)
Bicarbonate: 27 mmol/L (ref 20.0–28.0)
Bicarbonate: 28 mmol/L (ref 20.0–28.0)
Calcium, Ion: 0.87 mmol/L — CL (ref 1.15–1.40)
Calcium, Ion: 1.18 mmol/L (ref 1.15–1.40)
HCT: 22 % — ABNORMAL LOW (ref 36.0–46.0)
HCT: 23 % — ABNORMAL LOW (ref 36.0–46.0)
Hemoglobin: 7.5 g/dL — ABNORMAL LOW (ref 12.0–15.0)
Hemoglobin: 7.8 g/dL — ABNORMAL LOW (ref 12.0–15.0)
O2 Saturation: 100 %
O2 Saturation: 100 %
Potassium: 3.4 mmol/L — ABNORMAL LOW (ref 3.5–5.1)
Potassium: 3 mmol/L — ABNORMAL LOW (ref 3.5–5.1)
Sodium: 139 mmol/L (ref 135–145)
Sodium: 139 mmol/L (ref 135–145)
TCO2: 28 mmol/L (ref 22–32)
TCO2: 29 mmol/L (ref 22–32)
pCO2 arterial: 40.2 mmHg (ref 32.0–48.0)
pCO2 arterial: 40.7 mmHg (ref 32.0–48.0)
pH, Arterial: 7.435 (ref 7.350–7.450)
pH, Arterial: 7.446 (ref 7.350–7.450)
pO2, Arterial: 385 mmHg — ABNORMAL HIGH (ref 83.0–108.0)
pO2, Arterial: 461 mmHg — ABNORMAL HIGH (ref 83.0–108.0)

## 2019-02-03 LAB — DIC (DISSEMINATED INTRAVASCULAR COAGULATION)PANEL
D-Dimer, Quant: 0.55 ug/mL-FEU — ABNORMAL HIGH (ref 0.00–0.50)
Fibrinogen: 232 mg/dL (ref 210–475)
INR: 1.8 — ABNORMAL HIGH (ref 0.8–1.2)
Platelets: 105 10*3/uL — ABNORMAL LOW (ref 150–400)
Prothrombin Time: 20.7 seconds — ABNORMAL HIGH (ref 11.4–15.2)
Smear Review: NONE SEEN
aPTT: 44 seconds — ABNORMAL HIGH (ref 24–36)

## 2019-02-03 LAB — PROTIME-INR
INR: 1.8 — ABNORMAL HIGH (ref 0.8–1.2)
Prothrombin Time: 20.4 seconds — ABNORMAL HIGH (ref 11.4–15.2)

## 2019-02-03 LAB — RENAL FUNCTION PANEL
Albumin: 2.6 g/dL — ABNORMAL LOW (ref 3.5–5.0)
Anion gap: 9 (ref 5–15)
BUN: 31 mg/dL — ABNORMAL HIGH (ref 8–23)
CO2: 21 mmol/L — ABNORMAL LOW (ref 22–32)
Calcium: 8.1 mg/dL — ABNORMAL LOW (ref 8.9–10.3)
Chloride: 105 mmol/L (ref 98–111)
Creatinine, Ser: 2.98 mg/dL — ABNORMAL HIGH (ref 0.44–1.00)
GFR calc Af Amer: 17 mL/min — ABNORMAL LOW (ref >60)
GFR calc non Af Amer: 15 mL/min — ABNORMAL LOW (ref >60)
Glucose, Bld: 244 mg/dL — ABNORMAL HIGH (ref 70–99)
Phosphorus: 3.5 mg/dL (ref 2.5–4.6)
Potassium: 3.6 mmol/L (ref 3.5–5.1)
Sodium: 135 mmol/L (ref 135–145)

## 2019-02-03 LAB — CBC
HCT: 26.1 % — ABNORMAL LOW (ref 36.0–46.0)
HCT: 28.6 % — ABNORMAL LOW (ref 36.0–46.0)
Hemoglobin: 8.7 g/dL — ABNORMAL LOW (ref 12.0–15.0)
Hemoglobin: 9.6 g/dL — ABNORMAL LOW (ref 12.0–15.0)
MCH: 28.8 pg (ref 26.0–34.0)
MCH: 29.4 pg (ref 26.0–34.0)
MCHC: 33.3 g/dL (ref 30.0–36.0)
MCHC: 33.6 g/dL (ref 30.0–36.0)
MCV: 86.4 fL (ref 80.0–100.0)
MCV: 87.7 fL (ref 80.0–100.0)
Platelets: 224 10*3/uL (ref 150–400)
Platelets: 111 10*3/uL — ABNORMAL LOW (ref 150–400)
RBC: 3.02 MIL/uL — ABNORMAL LOW (ref 3.87–5.11)
RBC: 3.26 MIL/uL — ABNORMAL LOW (ref 3.87–5.11)
RDW: 12.8 % (ref 11.5–15.5)
RDW: 13.6 % (ref 11.5–15.5)
WBC: 10.1 10*3/uL (ref 4.0–10.5)
WBC: 12.2 10*3/uL — ABNORMAL HIGH (ref 4.0–10.5)
nRBC: 0 % (ref 0.0–0.2)
nRBC: 0 % (ref 0.0–0.2)

## 2019-02-03 LAB — ECHO INTRAOPERATIVE TEE
Height: 62 in
Weight: 2846.58 oz

## 2019-02-03 LAB — HEPARIN LEVEL (UNFRACTIONATED): Heparin Unfractionated: 0.36 IU/mL (ref 0.30–0.70)

## 2019-02-03 LAB — PREPARE RBC (CROSSMATCH)

## 2019-02-03 LAB — PLATELET COUNT: Platelets: 108 10*3/uL — ABNORMAL LOW (ref 150–400)

## 2019-02-03 LAB — HEMOGLOBIN AND HEMATOCRIT, BLOOD
HCT: 23.4 % — ABNORMAL LOW (ref 36.0–46.0)
Hemoglobin: 7.9 g/dL — ABNORMAL LOW (ref 12.0–15.0)

## 2019-02-03 LAB — APTT: aPTT: 42 seconds — ABNORMAL HIGH (ref 24–36)

## 2019-02-03 SURGERY — CORONARY ARTERY BYPASS GRAFTING (CABG)
Anesthesia: General | Site: Chest

## 2019-02-03 MED ORDER — PROPOFOL 10 MG/ML IV BOLUS
INTRAVENOUS | Status: DC | PRN
  Administered 2019-02-03: 50 mg via INTRAVENOUS

## 2019-02-03 MED ORDER — PROTAMINE SULFATE 10 MG/ML IV SOLN
INTRAVENOUS | Status: DC | PRN
  Administered 2019-02-03 (×2): 10 mg via INTRAVENOUS
  Administered 2019-02-03: 230 mg via INTRAVENOUS

## 2019-02-03 MED ORDER — ONDANSETRON HCL 4 MG/2ML IJ SOLN: 4.0000 mg | Freq: Four times a day (QID) | INTRAMUSCULAR | Status: DC | PRN

## 2019-02-03 MED ORDER — PROPOFOL 10 MG/ML IV BOLUS
INTRAVENOUS | Status: AC
  Filled 2019-02-03: qty 20

## 2019-02-03 MED ORDER — ROCURONIUM BROMIDE 10 MG/ML (PF) SYRINGE
PREFILLED_SYRINGE | INTRAVENOUS | Status: AC
  Filled 2019-02-03: qty 10

## 2019-02-03 MED ORDER — MILRINONE LACTATE IN DEXTROSE 20-5 MG/100ML-% IV SOLN
0.3750 ug/kg/min | INTRAVENOUS | Status: DC
  Administered 2019-02-03: 0.25 ug/kg/min via INTRAVENOUS
  Administered 2019-02-04 – 2019-02-05 (×2): 0.375 ug/kg/min via INTRAVENOUS
  Filled 2019-02-03 (×4): qty 100

## 2019-02-03 MED ORDER — HEMOSTATIC AGENTS (NO CHARGE) OPTIME
TOPICAL | Status: DC | PRN
  Administered 2019-02-03 (×6): 1 via TOPICAL

## 2019-02-03 MED ORDER — ROCURONIUM BROMIDE 10 MG/ML (PF) SYRINGE
PREFILLED_SYRINGE | INTRAVENOUS | Status: DC | PRN
  Administered 2019-02-03: 50 mg via INTRAVENOUS
  Administered 2019-02-03: 60 mg via INTRAVENOUS

## 2019-02-03 MED ORDER — BISACODYL 5 MG PO TBEC
10.0000 mg | DELAYED_RELEASE_TABLET | Freq: Every day | ORAL | Status: DC
  Administered 2019-02-07 – 2019-02-08 (×2): 10 mg via ORAL
  Filled 2019-02-03 (×2): qty 2

## 2019-02-03 MED ORDER — METOPROLOL TARTRATE 5 MG/5ML IV SOLN: 2.5000 mg | INTRAVENOUS | Status: DC | PRN

## 2019-02-03 MED ORDER — SODIUM CHLORIDE 0.9% IV SOLUTION: Freq: Once | INTRAVENOUS | Status: DC

## 2019-02-03 MED ORDER — SODIUM CHLORIDE 0.9 % IV SOLN
INTRAVENOUS | Status: DC
  Administered 2019-02-05: 10 mL/h via INTRAVENOUS
  Administered 2019-02-06: 08:00:00 via INTRAVENOUS

## 2019-02-03 MED ORDER — MIDAZOLAM HCL 5 MG/5ML IJ SOLN
INTRAMUSCULAR | Status: DC | PRN
  Administered 2019-02-03: 3 mg via INTRAVENOUS
  Administered 2019-02-03: 2 mg via INTRAVENOUS
  Administered 2019-02-03: 3 mg via INTRAVENOUS

## 2019-02-03 MED ORDER — INSULIN REGULAR BOLUS VIA INFUSION
0.0000 [IU] | Freq: Three times a day (TID) | INTRAVENOUS | Status: DC
  Filled 2019-02-03: qty 10

## 2019-02-03 MED ORDER — DOCUSATE SODIUM 100 MG PO CAPS: 200.0000 mg | ORAL_CAPSULE | Freq: Every day | ORAL | Status: DC

## 2019-02-03 MED ORDER — FENTANYL CITRATE (PF) 250 MCG/5ML IJ SOLN
INTRAMUSCULAR | Status: AC
  Filled 2019-02-03: qty 5

## 2019-02-03 MED ORDER — LEVOFLOXACIN IN D5W 250 MG/50ML IV SOLN
250.0000 mg | INTRAVENOUS | Status: AC
  Administered 2019-02-04: 250 mg via INTRAVENOUS
  Filled 2019-02-03: qty 50

## 2019-02-03 MED ORDER — STERILE WATER FOR INJECTION IJ SOLN
INTRAMUSCULAR | Status: AC
  Filled 2019-02-03: qty 10

## 2019-02-03 MED ORDER — VANCOMYCIN HCL 1000 MG IV SOLR
INTRAVENOUS | Status: DC | PRN
  Administered 2019-02-03: 3000 mg

## 2019-02-03 MED ORDER — SODIUM CHLORIDE 0.9 % IV SOLN
250.0000 mL | INTRAVENOUS | Status: DC
  Administered 2019-02-08 (×2): via INTRAVENOUS

## 2019-02-03 MED ORDER — PLASMA-LYTE 148 IV SOLN
INTRAVENOUS | Status: AC
  Administered 2019-02-03: 500 mL
  Filled 2019-02-03: qty 2.5

## 2019-02-03 MED ORDER — MORPHINE SULFATE (PF) 2 MG/ML IV SOLN
1.0000 mg | INTRAVENOUS | Status: DC | PRN
  Administered 2019-02-04 (×4): 2 mg via INTRAVENOUS
  Filled 2019-02-03: qty 1
  Filled 2019-02-03: qty 2
  Filled 2019-02-03: qty 1

## 2019-02-03 MED ORDER — LACTATED RINGERS IV SOLN
INTRAVENOUS | Status: DC | PRN
  Administered 2019-02-03: 14:00:00 via INTRAVENOUS

## 2019-02-03 MED ORDER — METOPROLOL TARTRATE 25 MG/10 ML ORAL SUSPENSION: 12.5000 mg | Freq: Two times a day (BID) | ORAL | Status: DC

## 2019-02-03 MED ORDER — LIDOCAINE 2% (20 MG/ML) 5 ML SYRINGE
INTRAMUSCULAR | Status: DC | PRN
  Administered 2019-02-03: 60 mg via INTRAVENOUS

## 2019-02-03 MED ORDER — VANCOMYCIN HCL 1000 MG IV SOLR
INTRAVENOUS | Status: AC
  Filled 2019-02-03: qty 3000

## 2019-02-03 MED ORDER — PLASMA-LYTE 148 IV SOLN
INTRAVENOUS | Status: DC | PRN
  Administered 2019-02-03: 500 mL

## 2019-02-03 MED ORDER — ASPIRIN 81 MG PO CHEW
324.0000 mg | CHEWABLE_TABLET | Freq: Every day | ORAL | Status: DC
  Administered 2019-02-05 – 2019-02-10 (×6): 324 mg
  Filled 2019-02-03 (×6): qty 4

## 2019-02-03 MED ORDER — CALCIUM CHLORIDE 10 % IV SOLN
INTRAVENOUS | Status: DC | PRN
  Administered 2019-02-03 (×2): 100 mg via INTRAVENOUS

## 2019-02-03 MED ORDER — PHENYLEPHRINE HCL-NACL 20-0.9 MG/250ML-% IV SOLN
0.0000 ug/min | INTRAVENOUS | Status: DC
  Filled 2019-02-03: qty 250

## 2019-02-03 MED ORDER — LACTATED RINGERS IV SOLN: INTRAVENOUS | Status: DC

## 2019-02-03 MED ORDER — ALBUMIN HUMAN 5 % IV SOLN
250.0000 mL | INTRAVENOUS | Status: AC | PRN
  Administered 2019-02-03 (×2): 12.5 g via INTRAVENOUS
  Filled 2019-02-03: qty 1000
  Filled 2019-02-03 (×2): qty 250

## 2019-02-03 MED ORDER — CHLORHEXIDINE GLUCONATE 0.12 % MT SOLN: 15.0000 mL | OROMUCOSAL | Status: AC

## 2019-02-03 MED ORDER — LACTATED RINGERS IV SOLN
INTRAVENOUS | Status: DC | PRN
  Administered 2019-02-03 (×2): via INTRAVENOUS

## 2019-02-03 MED ORDER — LIDOCAINE 2% (20 MG/ML) 5 ML SYRINGE
INTRAMUSCULAR | Status: AC
  Filled 2019-02-03: qty 15

## 2019-02-03 MED ORDER — SODIUM CHLORIDE 0.9% FLUSH: 3.0000 mL | INTRAVENOUS | Status: DC | PRN

## 2019-02-03 MED ORDER — METOPROLOL TARTRATE 12.5 MG HALF TABLET: 12.5000 mg | ORAL_TABLET | Freq: Two times a day (BID) | ORAL | Status: DC

## 2019-02-03 MED ORDER — PANTOPRAZOLE SODIUM 40 MG PO TBEC: 40.0000 mg | DELAYED_RELEASE_TABLET | Freq: Every day | ORAL | Status: DC

## 2019-02-03 MED ORDER — ACETAMINOPHEN 160 MG/5ML PO SOLN
1000.0000 mg | Freq: Four times a day (QID) | ORAL | Status: AC
  Administered 2019-02-04 – 2019-02-07 (×3): 1000 mg
  Filled 2019-02-03 (×4): qty 40.6

## 2019-02-03 MED ORDER — LACTATED RINGERS IV SOLN: 500.0000 mL | Freq: Once | INTRAVENOUS | Status: DC | PRN

## 2019-02-03 MED ORDER — ACETAMINOPHEN 160 MG/5ML PO SOLN: 650.0000 mg | Freq: Once | ORAL | Status: AC

## 2019-02-03 MED ORDER — FAMOTIDINE IN NACL 20-0.9 MG/50ML-% IV SOLN
20.0000 mg | Freq: Two times a day (BID) | INTRAVENOUS | Status: AC
  Administered 2019-02-04: 20 mg via INTRAVENOUS
  Filled 2019-02-03: qty 50

## 2019-02-03 MED ORDER — PHENYLEPHRINE 40 MCG/ML (10ML) SYRINGE FOR IV PUSH (FOR BLOOD PRESSURE SUPPORT)
PREFILLED_SYRINGE | INTRAVENOUS | Status: AC
  Filled 2019-02-03: qty 30

## 2019-02-03 MED ORDER — ACETAMINOPHEN 500 MG PO TABS: 1000.0000 mg | ORAL_TABLET | Freq: Four times a day (QID) | ORAL | Status: AC

## 2019-02-03 MED ORDER — DEXMEDETOMIDINE HCL IN NACL 400 MCG/100ML IV SOLN
0.0000 ug/kg/h | INTRAVENOUS | Status: DC
  Administered 2019-02-04: 0.6 ug/kg/h via INTRAVENOUS
  Filled 2019-02-03: qty 100
  Filled 2019-02-03: qty 200
  Filled 2019-02-03: qty 100

## 2019-02-03 MED ORDER — NOREPINEPHRINE 4 MG/250ML-% IV SOLN
INTRAVENOUS | Status: AC
  Administered 2019-02-03: 4 mg
  Filled 2019-02-03: qty 250

## 2019-02-03 MED ORDER — ACETAMINOPHEN 650 MG RE SUPP
650.0000 mg | Freq: Once | RECTAL | Status: AC
  Administered 2019-02-03: 650 mg via RECTAL

## 2019-02-03 MED ORDER — ALBUMIN HUMAN 5 % IV SOLN
INTRAVENOUS | Status: DC | PRN
  Administered 2019-02-03 (×2): via INTRAVENOUS

## 2019-02-03 MED ORDER — BISACODYL 10 MG RE SUPP
10.0000 mg | Freq: Every day | RECTAL | Status: DC
  Administered 2019-02-09: 10 mg via RECTAL
  Filled 2019-02-03: qty 1

## 2019-02-03 MED ORDER — SODIUM CHLORIDE 0.9 % IV SOLN
0.1500 ug/kg | Freq: Once | INTRAVENOUS | Status: AC
  Administered 2019-02-03: 24.21 ug via INTRAVENOUS
  Filled 2019-02-03: qty 3

## 2019-02-03 MED ORDER — MIDAZOLAM HCL 2 MG/2ML IJ SOLN
2.0000 mg | INTRAMUSCULAR | Status: DC | PRN
  Administered 2019-02-04 (×3): 2 mg via INTRAVENOUS
  Filled 2019-02-03 (×4): qty 2

## 2019-02-03 MED ORDER — HEPARIN SODIUM (PORCINE) 1000 UNIT/ML IJ SOLN
INTRAMUSCULAR | Status: DC | PRN
  Administered 2019-02-03: 5000 [IU] via INTRAVENOUS
  Administered 2019-02-03: 28000 [IU] via INTRAVENOUS

## 2019-02-03 MED ORDER — FENTANYL CITRATE (PF) 250 MCG/5ML IJ SOLN
INTRAMUSCULAR | Status: AC
  Filled 2019-02-03: qty 20

## 2019-02-03 MED ORDER — NITROGLYCERIN IN D5W 200-5 MCG/ML-% IV SOLN
0.0000 ug/min | INTRAVENOUS | Status: DC
  Administered 2019-02-04: 15 ug/min via INTRAVENOUS
  Administered 2019-02-08: 5 ug/min via INTRAVENOUS
  Filled 2019-02-03: qty 250

## 2019-02-03 MED ORDER — VANCOMYCIN HCL IN DEXTROSE 1-5 GM/200ML-% IV SOLN: 1000.0000 mg | Freq: Once | INTRAVENOUS | Status: DC

## 2019-02-03 MED ORDER — NOREPINEPHRINE 4 MG/250ML-% IV SOLN
INTRAVENOUS | Status: AC
  Filled 2019-02-03: qty 250

## 2019-02-03 MED ORDER — MAGNESIUM SULFATE 4 GM/100ML IV SOLN: 4.0000 g | Freq: Once | INTRAVENOUS | Status: DC

## 2019-02-03 MED ORDER — ASPIRIN EC 325 MG PO TBEC: 325.0000 mg | DELAYED_RELEASE_TABLET | Freq: Every day | ORAL | Status: DC

## 2019-02-03 MED ORDER — SODIUM BICARBONATE-DEXTROSE 150-5 MEQ/L-% IV SOLN
150.0000 meq | INTRAVENOUS | Status: DC
  Administered 2019-02-03: 150 meq via INTRAVENOUS
  Filled 2019-02-03 (×2): qty 1000

## 2019-02-03 MED ORDER — OXYCODONE HCL 5 MG PO TABS: 5.0000 mg | ORAL_TABLET | ORAL | Status: DC | PRN

## 2019-02-03 MED ORDER — MIDAZOLAM HCL (PF) 10 MG/2ML IJ SOLN
INTRAMUSCULAR | Status: AC
  Filled 2019-02-03: qty 2

## 2019-02-03 MED ORDER — LACTATED RINGERS IV SOLN
INTRAVENOUS | Status: DC
  Administered 2019-02-05: 11:00:00 via INTRAVENOUS

## 2019-02-03 MED ORDER — 0.9 % SODIUM CHLORIDE (POUR BTL) OPTIME
TOPICAL | Status: DC | PRN
  Administered 2019-02-03: 5000 mL
  Administered 2019-02-03: 1000 mL

## 2019-02-03 MED ORDER — VANCOMYCIN HCL 1000 MG IV SOLR
INTRAVENOUS | Status: DC | PRN
  Administered 2019-02-03: 1000 mL

## 2019-02-03 MED ORDER — SODIUM CHLORIDE 0.45 % IV SOLN: INTRAVENOUS | Status: DC | PRN

## 2019-02-03 MED ORDER — POTASSIUM CHLORIDE 10 MEQ/50ML IV SOLN
10.0000 meq | INTRAVENOUS | Status: AC
  Administered 2019-02-03 – 2019-02-04 (×3): 10 meq via INTRAVENOUS

## 2019-02-03 MED ORDER — SODIUM CHLORIDE 0.9% FLUSH
3.0000 mL | Freq: Two times a day (BID) | INTRAVENOUS | Status: DC
  Administered 2019-02-04 – 2019-02-09 (×10): 3 mL via INTRAVENOUS

## 2019-02-03 MED ORDER — INSULIN REGULAR(HUMAN) IN NACL 100-0.9 UT/100ML-% IV SOLN
INTRAVENOUS | Status: DC
  Administered 2019-02-04: 4.2 [IU]/h via INTRAVENOUS
  Filled 2019-02-03 (×2): qty 100

## 2019-02-03 SURGICAL SUPPLY — 122 items
ADAPTER CARDIO PERF ANTE/RETRO (ADAPTER) ×3 IMPLANT
ADH SKN CLS APL DERMABOND .7 (GAUZE/BANDAGES/DRESSINGS) ×2
ADPR PRFSN 84XANTGRD RTRGD (ADAPTER) ×2
APL SKNCLS STERI-STRIP NONHPOA (GAUZE/BANDAGES/DRESSINGS) ×2
APL SWBSTK 6 STRL LF DISP (MISCELLANEOUS) ×2
APPLICATOR COTTON TIP 6 STRL (MISCELLANEOUS) IMPLANT
APPLICATOR COTTON TIP 6IN STRL (MISCELLANEOUS) ×3
BAG DECANTER FOR FLEXI CONT (MISCELLANEOUS) ×4 IMPLANT
BASKET HEART (ORDER IN 25'S) (MISCELLANEOUS) ×1
BASKET HEART (ORDER IN 25S) (MISCELLANEOUS) ×2 IMPLANT
BENZOIN TINCTURE PRP APPL 2/3 (GAUZE/BANDAGES/DRESSINGS) ×1 IMPLANT
BLADE 11 SAFETY STRL DISP (BLADE) ×1 IMPLANT
BLADE CLIPPER SURG (BLADE) ×2 IMPLANT
BLADE STERNUM SYSTEM 6 (BLADE) ×3 IMPLANT
BLOWER MISTER CAL-MED (MISCELLANEOUS) ×1 IMPLANT
BNDG ELASTIC 4X5.8 VLCR STR LF (GAUZE/BANDAGES/DRESSINGS) ×3 IMPLANT
BNDG ELASTIC 6X5.8 VLCR STR LF (GAUZE/BANDAGES/DRESSINGS) ×3 IMPLANT
BNDG GAUZE ELAST 4 BULKY (GAUZE/BANDAGES/DRESSINGS) ×3 IMPLANT
CANISTER SUCT 3000ML PPV (MISCELLANEOUS) ×3 IMPLANT
CANISTER WOUNDNEG PRESSURE 500 (CANNISTER) ×1 IMPLANT
CANNULA NON VENT 18FR 12 (CANNULA) ×1 IMPLANT
CANNULA SUMP PERICARDIAL (CANNULA) ×1 IMPLANT
CATH CPB KIT HENDRICKSON (MISCELLANEOUS) ×3 IMPLANT
CATH HEART VENT LEFT (CATHETERS) IMPLANT
CATH ROBINSON RED A/P 18FR (CATHETERS) ×7 IMPLANT
CLIP RETRACTION 3.0MM CORONARY (MISCELLANEOUS) ×3 IMPLANT
CLIP VESOCCLUDE SM WIDE 24/CT (CLIP) ×2 IMPLANT
CONN ST 1/4X3/8  BEN (MISCELLANEOUS) ×1
CONN ST 1/4X3/8 BEN (MISCELLANEOUS) IMPLANT
COVER PROBE W GEL 5X96 (DRAPES) ×1 IMPLANT
COVER WAND RF STERILE (DRAPES) ×2 IMPLANT
DEFOGGER ANTIFOG KIT (MISCELLANEOUS) ×1 IMPLANT
DERMABOND ADVANCED (GAUZE/BANDAGES/DRESSINGS) ×1
DERMABOND ADVANCED .7 DNX12 (GAUZE/BANDAGES/DRESSINGS) ×2 IMPLANT
DRAIN CHANNEL 28F RND 3/8 FF (WOUND CARE) ×9 IMPLANT
DRAPE CARDIOVASCULAR INCISE (DRAPES) ×3
DRAPE SLUSH/WARMER DISC (DRAPES) ×3 IMPLANT
DRAPE SRG 135X102X78XABS (DRAPES) ×2 IMPLANT
DRSG AQUACEL AG ADV 3.5X14 (GAUZE/BANDAGES/DRESSINGS) ×2 IMPLANT
DRSG VAC ATS LRG SENSATRAC (GAUZE/BANDAGES/DRESSINGS) ×1 IMPLANT
ELECT BLADE 4.0 EZ CLEAN MEGAD (MISCELLANEOUS) ×3
ELECT CAUTERY BLADE 6.4 (BLADE) ×3 IMPLANT
ELECT REM PT RETURN 9FT ADLT (ELECTROSURGICAL) ×6
ELECTRODE BLDE 4.0 EZ CLN MEGD (MISCELLANEOUS) IMPLANT
ELECTRODE REM PT RTRN 9FT ADLT (ELECTROSURGICAL) ×4 IMPLANT
FELT TEFLON 1X6 (MISCELLANEOUS) ×5 IMPLANT
GAUZE SPONGE 4X4 12PLY STRL (GAUZE/BANDAGES/DRESSINGS) ×5 IMPLANT
GAUZE SPONGE 4X4 12PLY STRL LF (GAUZE/BANDAGES/DRESSINGS) ×1 IMPLANT
GAUZE XEROFORM 5X9 LF (GAUZE/BANDAGES/DRESSINGS) ×1 IMPLANT
GLOVE BIO SURGEON STRL SZ 6 (GLOVE) ×2 IMPLANT
GLOVE BIO SURGEON STRL SZ 6.5 (GLOVE) ×3 IMPLANT
GLOVE BIOGEL PI IND STRL 6 (GLOVE) IMPLANT
GLOVE BIOGEL PI IND STRL 6.5 (GLOVE) IMPLANT
GLOVE BIOGEL PI INDICATOR 6 (GLOVE) ×1
GLOVE BIOGEL PI INDICATOR 6.5 (GLOVE) ×2
GLOVE NEODERM STRL 7.5 LF PF (GLOVE) ×6 IMPLANT
GLOVE SURG NEODERM 7.5  LF PF (GLOVE) ×2
GOWN STRL REUS W/ TWL LRG LVL3 (GOWN DISPOSABLE) ×8 IMPLANT
GOWN STRL REUS W/TWL LRG LVL3 (GOWN DISPOSABLE) ×30
HEMOSTAT POWDER SURGIFOAM 1G (HEMOSTASIS) ×9 IMPLANT
HEMOSTAT SURGICEL 2X14 (HEMOSTASIS) ×2 IMPLANT
KIT BASIN OR (CUSTOM PROCEDURE TRAY) ×3 IMPLANT
KIT SUCTION CATH 14FR (SUCTIONS) ×3 IMPLANT
KIT TURNOVER KIT B (KITS) ×3 IMPLANT
KIT VASOVIEW HEMOPRO 2 VH 4000 (KITS) ×3 IMPLANT
LEAD PACING MYOCARDI (MISCELLANEOUS) ×2 IMPLANT
LINE VENT (MISCELLANEOUS) ×1 IMPLANT
MARKER GRAFT CORONARY BYPASS (MISCELLANEOUS) ×8 IMPLANT
NDL 18GX1X1/2 (RX/OR ONLY) (NEEDLE) ×2 IMPLANT
NEEDLE 18GX1X1/2 (RX/OR ONLY) (NEEDLE) ×6 IMPLANT
NS IRRIG 1000ML POUR BTL (IV SOLUTION) ×15 IMPLANT
PACK E OPEN HEART (SUTURE) ×3 IMPLANT
PACK OPEN HEART (CUSTOM PROCEDURE TRAY) ×3 IMPLANT
PACK SPY-PHI (KITS) ×1 IMPLANT
PAD ARMBOARD 7.5X6 YLW CONV (MISCELLANEOUS) ×6 IMPLANT
PAD ELECT DEFIB RADIOL ZOLL (MISCELLANEOUS) ×3 IMPLANT
PENCIL BUTTON HOLSTER BLD 10FT (ELECTRODE) ×3 IMPLANT
POSITIONER HEAD DONUT 9IN (MISCELLANEOUS) ×3 IMPLANT
POWDER SURGICEL 3.0 GRAM (HEMOSTASIS) ×1 IMPLANT
SET CARDIOPLEGIA MPS 5001102 (MISCELLANEOUS) ×1 IMPLANT
SUT BONE WAX W31G (SUTURE) ×3 IMPLANT
SUT ETHIBOND X763 2 0 SH 1 (SUTURE) ×2 IMPLANT
SUT MNCRL AB 3-0 PS2 18 (SUTURE) ×6 IMPLANT
SUT MNCRL AB 4-0 PS2 18 (SUTURE) ×1 IMPLANT
SUT PDS AB 1 CTX 36 (SUTURE) ×6 IMPLANT
SUT PROLENE 3 0 SH DA (SUTURE) ×3 IMPLANT
SUT PROLENE 4 0 RB 1 (SUTURE) ×6
SUT PROLENE 4 0 SH DA (SUTURE) ×4 IMPLANT
SUT PROLENE 4-0 RB1 .5 CRCL 36 (SUTURE) IMPLANT
SUT PROLENE 5 0 C 1 36 (SUTURE) IMPLANT
SUT PROLENE 6 0 C 1 30 (SUTURE) ×12 IMPLANT
SUT PROLENE 7 0 BV 1 (SUTURE) ×2 IMPLANT
SUT PROLENE 7 0 BV1 MDA (SUTURE) ×1 IMPLANT
SUT PROLENE 8 0 BV175 6 (SUTURE) ×2 IMPLANT
SUT PROLENE BLUE 7 0 (SUTURE) ×3 IMPLANT
SUT SILK  1 MH (SUTURE) ×2
SUT SILK 1 MH (SUTURE) IMPLANT
SUT SILK 2 0 SH CR/8 (SUTURE) IMPLANT
SUT SILK 3 0 SH CR/8 (SUTURE) IMPLANT
SUT STEEL 6MS V (SUTURE) ×3 IMPLANT
SUT STEEL SZ 6 DBL 3X14 BALL (SUTURE) ×3 IMPLANT
SUT TEM PAC WIRE 2 0 SH (SUTURE) ×2 IMPLANT
SUT VIC AB 2-0 CT1 27 (SUTURE) ×3
SUT VIC AB 2-0 CT1 TAPERPNT 27 (SUTURE) IMPLANT
SUT VIC AB 2-0 CTX 27 (SUTURE) IMPLANT
SUT VIC AB 3-0 X1 27 (SUTURE) IMPLANT
SYR 10ML LL (SYRINGE) ×2 IMPLANT
SYR 30ML LL (SYRINGE) ×4 IMPLANT
SYR 3ML LL SCALE MARK (SYRINGE) ×3 IMPLANT
SYRINGE 3CC LL L/F (MISCELLANEOUS) ×1 IMPLANT
SYSTEM HEARTSTRING SEAL 3.8 (VASCULAR PRODUCTS) IMPLANT
SYSTEM HEARTSTRING SEAL 3.8MM (VASCULAR PRODUCTS) ×6
SYSTEM SAHARA CHEST DRAIN ATS (WOUND CARE) ×3 IMPLANT
TAPE CLOTH SURG 4X10 WHT LF (GAUZE/BANDAGES/DRESSINGS) ×1 IMPLANT
TAPE PAPER 2X10 WHT MICROPORE (GAUZE/BANDAGES/DRESSINGS) ×1 IMPLANT
TOWEL GREEN STERILE (TOWEL DISPOSABLE) ×3 IMPLANT
TOWEL GREEN STERILE FF (TOWEL DISPOSABLE) ×3 IMPLANT
TRAY FOLEY SLVR 16FR TEMP STAT (SET/KITS/TRAYS/PACK) ×3 IMPLANT
TUBING LAP HI FLOW INSUFFLATIO (TUBING) ×3 IMPLANT
UNDERPAD 30X30 (UNDERPADS AND DIAPERS) ×3 IMPLANT
VENT LEFT HEART 12002 (CATHETERS) ×3
WATER STERILE IRR 1000ML POUR (IV SOLUTION) ×6 IMPLANT

## 2019-02-03 NOTE — Brief Op Note (Signed)
02/08/2019 - 01/28/2019  1:07 PM  PATIENT:  Paula Watson  74 y.o. female  PRE-OPERATIVE DIAGNOSIS:  Coronary Artery Disease  POST-OPERATIVE DIAGNOSIS:  Coronary Artery Disease  PROCEDURE:  Procedure(s):  CORONARY ARTERY BYPASS GRAFTING x 3  -FREE LIMA to LAD, as T-Graft off SVG -SVG to OM -SVG to PLB  ENDOSCOPIC HARVEST GREATER SAPHENOUS VEIN -Left Leg  TRANSESOPHAGEAL ECHOCARDIOGRAM (TEE) (N/A)  SURGEON:  Surgeon(s) and Role:    * Wonda Olds, MD - Primary  PHYSICIAN ASSISTANT: Ellwood Handler PA-C  ANESTHESIA:   general  EBL:  900 mL   BLOOD ADMINISTERED:3U PRBC, CELLSAVER and 2 pack PLTS  DRAINS: Left Pleural Chest Tube, Mediastinal Chest drain   LOCAL MEDICATIONS USED:  NONE  SPECIMEN:  No Specimen  DISPOSITION OF SPECIMEN:  N/A  COUNTS:  YES  TOURNIQUET:  * No tourniquets in log *  DICTATION: .Dragon Dictation  PLAN OF CARE: Admit to inpatient   PATIENT DISPOSITION:  ICU - intubated and hemodynamically stable.   Delay start of Pharmacological VTE agent (>24hrs) due to surgical blood loss or risk of bleeding: yes

## 2019-02-03 NOTE — Progress Notes (Signed)
Progress Note  Patient Name: Paula Watson Date of Encounter: 01/20/2019  Primary Cardiologist:  Kate Sable, MD  Subjective   Pt sleeping   Sedated     Inpatient Medications    Scheduled Meds:  aspirin  81 mg Oral Daily   atorvastatin  40 mg Oral q1800   bisacodyl  5 mg Oral Once   Calcifediol ER  30 mcg Oral QHS   carvedilol  12.5 mg Oral BID WC   Chlorhexidine Gluconate Cloth  6 each Topical Daily   darbepoetin (ARANESP) injection - NON-DIALYSIS  60 mcg Subcutaneous Q Wed-1800   epinephrine  0-10 mcg/min Intravenous To OR   heparin-papaverine-plasmalyte irrigation   Irrigation To OR   insulin aspart  0-5 Units Subcutaneous QHS   insulin aspart  0-9 Units Subcutaneous TID WC   insulin   Intravenous To OR   magnesium sulfate  40 mEq Other To OR    morphine injection  2 mg Intravenous Once   pantoprazole  20 mg Oral Daily   phenylephrine  30-200 mcg/min Intravenous To OR   potassium chloride  80 mEq Other To OR   sodium chloride flush  3 mL Intravenous Q12H   sodium chloride flush  3 mL Intravenous Q12H   tranexamic acid  15 mg/kg Intravenous To OR   tranexamic acid  2 mg/kg Intracatheter To OR   vancomycin 1000 mg in NS (1000 ml) irrigation for Dr. Roxy Manns case   Irrigation To OR   Continuous Infusions:  sodium chloride     dexmedetomidine     DOPamine     heparin 30,000 units/NS 1000 mL solution for CELLSAVER     heparin 700 Units/hr (02/05/2019 0700)   levofloxacin (LEVAQUIN) IV     milrinone     nitroGLYCERIN 160 mcg/min (02/12/2019 0700)   nitroGLYCERIN     norepinephrine (LEVOPHED) Adult infusion     sodium bicarbonate 150 mEq in dextrose 5% 1000 mL 75 mL/hr at 01/17/2019 0700   tranexamic acid (CYKLOKAPRON) infusion ([facility])     vancomycin     PRN Meds: sodium chloride, acetaminophen, albuterol, fentaNYL (SUBLIMAZE) injection, linaclotide, LORazepam, nitroGLYCERIN, ondansetron (ZOFRAN) IV, sodium chloride flush   Vital  Signs    Vitals:   01/15/2019 0630 01/24/2019 0645 01/13/2019 0700 01/13/2019 0715  BP:   (!) 147/52   Pulse: (!) 105 87 79 82  Resp: 16 18 14 15   Temp:      TempSrc:      SpO2: 97% 96% 96% 96%  Weight:      Height:        Intake/Output Summary (Last 24 hours) at 02/02/2019 0736 Last data filed at 01/23/2019 0700 Gross per 24 hour  Intake 2321.31 ml  Output 900 ml  Net 1421.31 ml   Filed Weights   02/01/19 0638 02/09/2019 0500 02/04/2019 0500  Weight: 82.5 kg 86.4 kg 80.7 kg   Last Weight  Most recent update: 01/23/2019  6:36 AM   Weight  80.7 kg (177 lb 14.6 oz)           Weight change: -5.7 kg   Telemetry     SR/ST- Personally Reviewed  ECG      Physical Exam   General: Obese 73 yo , female appearing in no acute distress.    Sleeping   Exam deferred  Labs    Hematology Recent Labs  Lab 02/01/19 0259 01/22/2019 0235 02/09/2019 1406 01/13/2019 1408 01/17/2019 0052  WBC 10.2 11.7*  --   --  10.1  RBC 3.41* 3.34*  --   --  3.02*  HGB 9.6* 9.4* 8.5* 8.8* 8.7*  HCT 29.9* 30.6* 25.0* 26.0* 26.1*  MCV 87.7 91.6  --   --  86.4  MCH 28.2 28.1  --   --  28.8  MCHC 32.1 30.7  --   --  33.3  RDW 13.0 12.9  --   --  12.8  PLT 258 272  --   --  224    Chemistry Recent Labs  Lab 01/25/2019 1028  01/27/2019 1402 02/01/19 0249 02/05/2019 0235 02/01/2019 1406 01/28/2019 1408 01/31/2019 0052  NA 139   < > 138 138 135 137 137 135  K 3.8  --  3.7 3.6 4.1 3.7 3.7 3.6  CL 104  --  106 108 107  --   --  105  CO2 20  --  21* 19* 18*  --   --  21*  GLUCOSE 274*   < > 371* 196* 210*  --   --  244*  BUN 29*   < > 31* 30* 33*  --   --  31*  CREATININE 2.51*  --  2.40* 2.81* 3.20*  --   --  2.98*  CALCIUM 10.3  --  9.6 8.9 8.6*  --   --  8.1*  PROT 6.0  --  6.5  --   --   --   --   --   ALBUMIN 3.7  --  3.3*  --  2.8*  --   --  2.6*  AST 11  --  15  --   --   --   --   --   ALT 10  --  13  --   --   --   --   --   ALKPHOS 109  --  82  --   --   --   --   --   BILITOT <0.2  --  0.5  --    --   --   --   --   GFRNONAA 18*  --  19* 16* 14*  --   --  15*  GFRAA 21*  --  22* 19* 16*  --   --  17*  ANIONGAP  --    < > 11 11 10   --   --  9   < > = values in this interval not displayed.     High Sensitivity Troponin:   Recent Labs  Lab 01/19/2019 1402 02/09/2019 1607 02/01/19 0259 02/01/19 1847  TROPONINIHS 80* 546* 3,266* 2,263*     Lab Results  Component Value Date   CHOL 127 02/01/2019   HDL 35 (L) 02/01/2019   LDLCALC 74 02/01/2019   TRIG 91 02/01/2019   CHOLHDL 3.6 02/01/2019   Lab Results  Component Value Date   TSH 6.927 (H) 02/01/2019   Lab Results  Component Value Date   HGBA1C 10.5 (H) 01/21/2019    Radiology    Ct Chest Wo Contrast  Result Date: 02/11/2019 CLINICAL DATA:  Aortic disease, nontraumatic. Preop CABG. Chest pain. EXAM: CT CHEST WITHOUT CONTRAST TECHNIQUE: Multidetector CT imaging of the chest was performed following the standard protocol without IV contrast. COMPARISON:  Chest radiograph 01/19/2019 FINDINGS: Cardiovascular: Intra-aortic balloon pump in place with tip in the proximal descending thoracic aorta. No aortic aneurysm. There is dense diffuse aortic atherosclerosis throughout the thoracic aorta and branch vessels. Chunky calcification in the proximal left subclavian causes some  degree of luminal narrowing dense coronary artery calcifications. Heart is normal in size. No pericardial effusion. A left subclavian to carotid stent is visualized, however patency not assessed on noncontrast exam. Mediastinum/Nodes: Small mediastinal lymph nodes are not enlarged by size criteria. No evidence of hilar adenopathy. Small hiatal hernia. Lungs/Pleura: Mild emphysema. Central bronchial thickening. Dependent atelectasis within both lung bases and perifissural left upper lobe. Small amount of fluid in the right major fissure. Trace pleural thickening. No pulmonary edema. No focal consolidation. No pulmonary mass. Trachea and mainstem bronchi are patent.  Upper Abdomen: Fatty atrophy of the pancreas. Left renal atrophy and sister partially included. There is excreted IV contrast in the right renal collecting system. Atherosclerosis of upper abdominal vasculature. Musculoskeletal: There are no acute or suspicious osseous abnormalities. Bones are under mineralized. Scattered Schmorl's nodes in the spine. Mild disc calcification in the mid and lower thoracic spine. IMPRESSION: 1. Normal caliber thoracic aorta with dense atherosclerosis throughout. Intra-aortic balloon pump in place. 2. Dense coronary artery calcifications versus stents. Normal heart size. 3. Mild emphysema and bronchial thickening. Dependent atelectasis in the lung bases with trace pleural thickening. Aortic Atherosclerosis (ICD10-I70.0) and Emphysema (ICD10-J43.9). Electronically Signed   By: Keith Rake M.D.   On: 02/09/2019 22:54   Dg Chest Port 1 View  Result Date: 01/25/2019 CLINICAL DATA:  Chest pain.  Hypertension. EXAM: PORTABLE CHEST 1 VIEW COMPARISON:  February 19, 2011 FINDINGS: There is no edema or consolidation. Heart size and pulmonary vascularity are normal. No adenopathy. There is aortic atherosclerosis. There are surgical clips in the left cervicothoracic junction region. IMPRESSION: No edema or consolidation. Cardiac silhouette within normal limits. Aortic Atherosclerosis (ICD10-I70.0). Electronically Signed   By: Lowella Grip III M.D.   On: 02/12/2019 14:39   Vas US Doppler Pre Cabg  Result Date: 02/02/2019 PREOPERATIVE VASCULAR EVALUATION  Indications:      Pre-surgical evaluation. Risk Factors:     Hypertension, hyperlipidemia, Diabetes, coronary artery                   disease. Limitations:      Balloon pump. Comparison Study: No prior study. Performing Technologist: Maudry Mayhew MHA, RVT, RDCS, RDMS  Examination Guidelines: A complete evaluation includes B-mode imaging, spectral Doppler, color Doppler, and power Doppler as needed of all accessible  portions of each vessel. Bilateral testing is considered an integral part of a complete examination. Limited examinations for reoccurring indications may be performed as noted.  Right Carotid Findings: +----------+-------+-------+--------+---------------------------------+--------+             PSV     EDV     Stenosis Describe                          Comments              cm/s    cm/s                                                         +----------+-------+-------+--------+---------------------------------+--------+  CCA Prox                            heterogenous and irregular        patent    +----------+-------+-------+--------+---------------------------------+--------+  CCA Distal  heterogenous, irregular and       patent                                         calcific                                    +----------+-------+-------+--------+---------------------------------+--------+ +----------+--------+-------+--------+------------+             PSV cm/s EDV cms Describe Arm Pressure  +----------+--------+-------+--------+------------+  Subclavian                  patent                 +----------+--------+-------+--------+------------+ +---------+--------+--------+--------------------+  Vertebral PSV cm/s EDV cm/s patent and Antegrade  +---------+--------+--------+--------------------+ Right ICA stent is patent. Unable to assess any grade of stenosis by velocity due to balloon pump. Left Carotid Findings: +----------+--------+--------+--------+--------+--------+             PSV cm/s EDV cm/s Stenosis Describe Comments  +----------+--------+--------+--------+--------+--------+  CCA Prox                     Occluded                    +----------+--------+--------+--------+--------+--------+  CCA Distal                   Occluded                    +----------+--------+--------+--------+--------+--------+  ICA Prox                     Occluded                     +----------+--------+--------+--------+--------+--------+  ECA                                            patent    +----------+--------+--------+--------+--------+--------+ +----------+--------+--------+--------+------------+  Subclavian PSV cm/s EDV cm/s Describe Arm Pressure  +----------+--------+--------+--------+------------+                               patent   143           +----------+--------+--------+--------+------------+ +---------+--------+--------+--------------------+  Vertebral PSV cm/s EDV cm/s Antegrade and patent  +---------+--------+--------+--------------------+  ABI Findings: +--------+------------------+-----+--------+-----------------------------------+  Right    Rt Pressure (mmHg) Index Waveform Comment                              +--------+------------------+-----+--------+-----------------------------------+  Brachial                          patent   Unable to obtain pressure due to TR                                              band.                                +--------+------------------+-----+--------+-----------------------------------+  PTA                               absent                                        +--------+------------------+-----+--------+-----------------------------------+  DP                                absent                                        +--------+------------------+-----+--------+-----------------------------------+ +--------+------------------+-----+--------+-------+  Left     Lt Pressure (mmHg) Index Waveform Comment  +--------+------------------+-----+--------+-------+  Brachial 143                      patent            +--------+------------------+-----+--------+-------+  PTA      111                0.78           patent   +--------+------------------+-----+--------+-------+  DP                                absent            +--------+------------------+-----+--------+-------+ +-------+---------------+----------------+   ABI/TBI Today's ABI/TBI Previous ABI/TBI  +-------+---------------+----------------+  Right   -                                 +-------+---------------+----------------+  Left    0.78                              +-------+---------------+----------------+  Right Doppler Findings: +-----------+--------+-----+-------+-----------------------------------------+  Site        Pressure Index Doppler Comments                                   +-----------+--------+-----+-------+-----------------------------------------+  Brachial                   patent  Unable to obtain pressure due to TR band.  +-----------+--------+-----+-------+-----------------------------------------+  Radial                     patent                                             +-----------+--------+-----+-------+-----------------------------------------+  Ulnar                      patent                                             +-----------+--------+-----+-------+-----------------------------------------+  Palmar Arch  Unable to assess due to TR band            +-----------+--------+-----+-------+-----------------------------------------+  Left Doppler Findings: +-----------+--------+-----+-------+---------------------+  Site        Pressure Index Doppler Comments               +-----------+--------+-----+-------+---------------------+  Brachial    143            patent                         +-----------+--------+-----+-------+---------------------+  Radial                     patent                         +-----------+--------+-----+-------+---------------------+  Ulnar                      patent                         +-----------+--------+-----+-------+---------------------+  Palmar Arch                        Within normal limits.  +-----------+--------+-----+-------+---------------------+  Summary: Right Carotid: Right ICA stent is patent. Unable to assess any grade of stenosis                by velocity due to  balloon pump. Left Carotid: Left CCA and ICA appear to be occluded by B mode, color Doppler,               and pulsed wave Doppler. Vertebrals:  Bilateral vertebral arteries demonstrate antegrade flow. Subclavians: Bilateral subclavian arteries are patent with antegrade flow. Right ABI: Unable to calculate ABI due to absent pulses. Left ABI: Resting left ankle-brachial index indicates moderate left lower extremity arterial disease.     Preliminary      Cardiac Studies   ECHO:   1. Left ventricular ejection fraction, by visual estimation, is 50 to 55%. Normal left ventricular size. Left ventricular septal wall thickness was severely increased. Severely increased left ventricular posterior wall thickness. Inadequate for Regional wall motion assessment. 2. Global right ventricle has normal systolic function.The right ventricular size is normal. No increase in right ventricular wall thickness. 3. Left atrial size was normal. 4. Right atrial size was normal. 5. The mitral valve is normal in structure. Trace mitral valve regurgitation. No evidence of mitral stenosis. 6. The tricuspid valve is normal in structure. Tricuspid valve regurgitation is trivial. 7. The aortic valve is tricuspid Aortic valve regurgitation was not visualized by color flow Doppler. Moderate aortic valve sclerosis/calcification without any evidence of aortic stenosis. 8. The pulmonic valve was normal in structure. Pulmonic valve regurgitation is not visualized by color flow Doppler. 9. Normal pulmonary artery systolic pressure. 10. The inferior vena cava is normal in size with greater than 50% respiratory variability, suggesting right atrial pressure of 3 mmHg. 11. Left ventricular diastolic Doppler parameters are consistent with impaired relaxation pattern of LV diastolic filling. 12. Recommend limited study with Definity contrast. CATH:  Ost LM to Dist LM lesion is 99% stenosed.  Mid LAD lesion is 40%  stenosed.  Prox Cx to Mid Cx lesion is 80% stenosed.  Prox RCA lesion is 80% stenosed.  Mid RCA lesion is 80% stenosed.  RPDA lesion is 60% stenosed.   1.  Critical left  main and significant RCA disease.  The coronary arteries are heavily calcified. 2.  Left ventricular angiography was not performed.  EF was 50% by echo. 3.  LVEDP was significantly elevated at 32 mmHg.  However, by right heart cath, pulmonary capillary wedge pressure was only 11 mmHg with PA pressure of 43/12.  Cardiac output was 5.32 with a cardiac index of 2.89.  Right heart catheterization was done after placing intra-aortic balloon pump which might have improved the hemodynamics. 4.  Successful intra-aortic balloon pump placement via the right common femoral artery.  CAROTID USN Right Carotid: Right ICA stent is patent. Unable to assess any grade of stenosis                by velocity due to balloon pump.  Left Carotid: Left CCA and ICA appear to be occluded by B mode, color Doppler,               and pulsed wave Doppler. Vertebrals:  Bilateral vertebral arteries demonstrate antegrade flow. Subclavians: Bilateral subclavian arteries are patent with antegrade flow.   Patient Profile     73 y.o. female w/ hx CVA s/p Right ICA stenting, chronic Left CCA occlusion, prior subclavian to carotid bypass, DM2, and CKD III was admitted from [facility] on 10/19 for NSTEMI.    Assessment & Plan    1.CAD   PT presented with [facility]/NSTEMI Cath yesteday as noted above  With 99% LM   Currently with IABP 1:1   Plan for CABG later today    2. CKD III-IV    REnal service following  Getting HCO3 now    3. DM - A1c is elevated, was 6.3 in 2019 DOse as needed with insulin  4. abnl TSH TSH 6.9  Free T4 minimally elevated 1.3  DOes not fit   WIll check free T3 Not thyrotoxic  - f/u with PCP  5. Dyslipidemia  - now on Lipitor 40 mg qd - 6.  CV dz   Severe dz as noted above      Had been on ASA and PLavix prior   Plavix stopped  with CABG    7  Anemia   Hgb 8.7 today      Richrd Humbles , [place] 7:36 AM 01/26/2019 Pager: [phone]

## 2019-02-03 NOTE — Op Note (Signed)
CARDIOTHORACIC SURGERY OPERATIVE NOTE  Date of Procedure: 01/23/2019  Preoperative Diagnosis: Severe 3-vessel Coronary Artery Disease including LM CAD, s/p NSTEMI  Postoperative Diagnosis: Same  Procedure:    Coronary Artery Bypass Grafting x 3   Free Left Internal Mammary Artery to Distal Left Anterior Descending Coronary Artery; Saphenous Vein Graft to right  Posterolateral Coronary Artery; Saphenous Vein Graft to Obtuse Marginal Branch of Left Circumflex Coronary Artery; Endoscopic Vein Harvest from left Thigh and Lower Leg; completion graft surveillance with indocyanine green fluorescence imaging (SPY); epiaortic ultrasonography; wound vac placement to the open chest  Surgeon: B. Murvin Natal, MD  Assistant: Ahmed Prima, E. PA-C  Anesthesia: get  Operative Findings:  preserved left ventricular systolic function  good quality left internal mammary artery conduit  good quality saphenous vein conduit  fair quality target vessels for grafting--small    BRIEF CLINICAL NOTE AND INDICATIONS FOR SURGERY  73 yo lady with DM, CRI, and severe PVD has experienced 2-3 weeks of crescendo angina accompanied by nausea and vomiting. Finally presented to ED this week where NSTEMI diagnosed. LHC shows ostial LM ulcerated plaque with 10/10 SSCP on cath table. IABP placed, consult to CT surgery. Now presenting for high-risk CABG.    DETAILS OF THE OPERATIVE PROCEDURE  Preparation:  The patient is brought to the operating room on the above mentioned date and central monitoring was established by the anesthesia team including placement of Swan-Ganz catheter and radial arterial line. The patient is placed in the supine position on the operating table.  Intravenous antibiotics are administered. General endotracheal anesthesia is induced uneventfully. A Foley catheter is placed.  Baseline transesophageal echocardiogram was performed.  Findings were notable for preserved LV function and no significant  valve disease.   The patient's chest, abdomen, both groins, and both lower extremities are prepared and draped in a sterile manner. A time out procedure is performed.   Surgical Approach and Conduit Harvest:  A median sternotomy incision was performed and the left internal mammary artery is dissected from the chest wall and prepared for bypass grafting. The left internal mammary artery is notably good quality conduit. Simultaneously, the greater saphenous vein is obtained from the patient's left thigh using endoscopic vein harvest technique. The saphenous vein is notably good quality conduit. After removal of the saphenous vein, the small surgical incisions in the lower extremity are closed with absorbable suture. Following systemic heparinization, the left internal mammary artery was transected proximally and distally due to patient's history of subclavian stenosis and renal insufficiency.   Extracorporeal Cardiopulmonary Bypass and Myocardial Protection:  The pericardium is opened. The ascending aorta is normal in appearance, but epiaortic ultrasonography shows heterogeneous plaque that is considered too diseased for clamping. The ascending aorta and the right atrium are cannulated for cardiopulmonary bypass.  Adequate heparinization is verified.   The entire pre-bypass portion of the operation was notable for stable hemodynamics augmented by the IABP>  Cardiopulmonary bypass was begun and the surface of the heart is inspected. Distal target vessels are selected for coronary artery bypass grafting.  The patient is kept warm. A vent is placed into the LV through the RSPV. The distal bypasses are done with the heart beating on bypass.   Coronary Artery Bypass Grafting:   The 2nd obtuse marginal branch of the left circumflex coronary artery was grafted using a reversed saphenous vein graft in an end-to-side fashion.  At the site of distal anastomosis the target vessel was fair quality and  measured approximately 1.5 mm in  diameter. Anastomotic patency and runoff was confirmed with indocyanine green fluorescence imaging (SPY).  The posterolateral branch of the right coronary artery was grafted using a reversed saphenous vein graft in an end-to-side fashion.  At the site of distal anastomosis the target vessel was fair quality and measured approximately 1.5 mm in diameter. Anastomotic patency and runoff was confirmed with indocyanine green fluorescence imaging (SPY).   The distal left anterior coronary artery was grafted with the left internal mammary artery in an end-to-side fashion.  At the site of distal anastomosis the target vessel was fair quality and measured approximately 1.5 mm in diameter. Anastomotic patency and runoff was confirmed with indocyanine green fluorescence imaging (SPY).  The proximal vein graft anastomoses were placed directly to the ascending aorta using Heart String devices: partial occlusion clamping of the aorta was not performed.  The free LIMA was anastomosed to the hood of the OM vein graft   Procedure Completion:  All proximal and distal coronary anastomoses were inspected for hemostasis and appropriate graft orientation. Epicardial pacing wires are fixed to the right ventricular outflow tract and to the right atrial appendage. The patient is rewarmed to 37C temperature. The patient is weaned and disconnected from cardiopulmonary bypass.  The patient's rhythm at separation from bypass was sinus bradycardia.  The patient was weaned from cardiopulmonary bypass without any inotropic support. Total cardiopulmonary bypass time for the operation was 128 minutes.  Followup transesophageal echocardiogram performed after separation from bypass revealed preserved LV function and no changes from the preoperative exam.  The aortic and venous cannula were removed uneventfully. Protamine was administered to reverse the anticoagulation. The mediastinum and pleural space  were inspected for hemostasis and irrigated with saline solution. The mediastinum and bilateral pleural spaces were drained using fluted chest tubes placed through separate stab incisions inferiorly.  The soft tissues anterior to the aorta were reapproximated loosely. Attempts to close the chest were met with hemodynamic decline, so the decision was made to keep the chest open overnight, particularly given her increased risk for bleeding. A wound vac was placed between the sternal halves. The post-bypass portion of the operation was otherwise notable for stable rhythm and hemodynamics.   Disposition:  The patient tolerated the procedure well and is transported to the surgical intensive care in critical but stable condition. There are no intraoperative complications. All sponge instrument and needle counts are verified correct at completion of the operation.   Jayme Cloud,  MD 02/01/2019 10:00 PM

## 2019-02-03 NOTE — Progress Notes (Signed)
  Echocardiogram Echocardiogram Transesophageal has been performed.  Burnett Kanaris 01/19/2019, 3:46 PM

## 2019-02-03 NOTE — OR Nursing (Signed)
SPY-PHI kit used for CABG procedure containing indocyanine green diluted with sterile water injectable (( 20 mLs) expires 02/12/2021 and 05/15/2021, then 0.5 mL removed and placed in 1000 mLs sterile water IV. Removal of 30 mLs and placed on sterile field to be mixed with 10 mLs of patient heparinized blood and will bel used 10 mLs per graft by Dr. Glenice Bow. Orvan Seen, MD.

## 2019-02-03 NOTE — Progress Notes (Signed)
[place] for Heparin Indication: chest pain/ACS  Allergies  Allergen Reactions  . Penicillins Hives and Swelling    PATIENT HAS HAD A PCN REACTION WITH IMMEDIATE RASH, FACIAL/TONGUE/THROAT SWELLING, SOB, OR LIGHTHEADEDNESS WITH HYPOTENSION:  #  #  #  YES  #  #  #   Has patient had a PCN reaction causing severe rash involving mucus membranes or skin necrosis: Unknown Has patient had a PCN reaction that required hospitalization: No Has patient had a PCN reaction occurring within the last 10 years: No     Patient Measurements: Height: 5\' 2"  (157.5 cm) Weight: 190 lb 7.6 oz (86.4 kg) IBW/kg (Calculated) : 50.1 Heparin Dosing Weight: 68.3  Vital Signs: Temp: 98.3 F (36.8 C) (10/21 2300) Temp Source: Oral (10/21 2300) BP: 142/94 (10/22 0100) Pulse Rate: 67 (10/22 0145)  Labs: Recent Labs    01/23/2019 1402 01/17/2019 1607  02/01/19 0249 02/01/19 0259 02/01/19 1847 01/24/2019 0235 02/07/2019 1406 01/26/2019 1408 01/22/2019 0052  HGB 11.2*  --   --   --  9.6*  --  9.4* 8.5* 8.8* 8.7*  HCT 35.9*  --   --   --  29.9*  --  30.6* 25.0* 26.0* 26.1*  PLT 263  --   --   --  258  --  272  --   --  224  LABPROT 13.4  --   --   --   --   --   --   --   --   --   INR 1.0  --   --   --   --   --   --   --   --   --   HEPARINUNFRC  --   --    < >  --  0.44  --  0.32  --   --  0.36  CREATININE 2.40*  --   --  2.81*  --   --  3.20*  --   --  2.98*  TROPONINIHS 80* 546*  --   --  3,266* 2,263*  --   --   --   --    < > = values in this interval not displayed.    Estimated Creatinine Clearance: 17.1 mL/min (A) (by C-G formula based on SCr of 2.98 mg/dL (H)).  Assessment: 73 y.o. female with CAD s/p cath, IABP placed, and awaiting CABG for heparin  Goal of Therapy:  Heparin level 0.2-0.5 while IABP in place. Monitor platelets by anticoagulation protocol: Yes   Plan:  Continue Heparin at current rate   Phillis Knack, PharmD, BCPS  01/22/2019 2:04 AM

## 2019-02-03 NOTE — Transfer of Care (Signed)
Immediate Anesthesia Transfer of Care Note  Patient: Paula Watson  Procedure(s) Performed: CORONARY ARTERY BYPASS GRAFTING (CABG) times three. Free IMA to LAD. Endoscopic harvesting of left greater saphenous vein to OM1 and Posterior Lateral (PLB). (N/A Chest) TRANSESOPHAGEAL ECHOCARDIOGRAM (TEE) (N/A )  Patient Location: ICU  Anesthesia Type:General  Level of Consciousness: sedated and Patient remains intubated per anesthesia plan  Airway & Oxygen Therapy: Patient remains intubated per anesthesia plan and Patient placed on Ventilator (see vital sign flow sheet for setting)  Post-op Assessment: Report given to RN and Post -op Vital signs reviewed and stable  Post vital signs: Reviewed and stable  Last Vitals:  Vitals Value Taken Time  BP    Temp 35.6 C 01/21/2019 2109  Pulse 85 02/09/2019 2109  Resp 12 02/12/2019 2109  SpO2 98 % 01/28/2019 2109  Vitals shown include unvalidated device data.  Last Pain:  Vitals:   02/05/2019 1100  TempSrc: Oral  PainSc:       Patients Stated Pain Goal: 0 (79/89/21 1941)  Complications: No apparent anesthesia complications

## 2019-02-03 NOTE — Progress Notes (Signed)
PHARMACY NOTE:  ANTIMICROBIAL RENAL DOSAGE ADJUSTMENT  Current antimicrobial regimen includes a mismatch between antimicrobial dosage and estimated renal function.  As per policy approved by the Pharmacy & Therapeutics and Medical Executive Committees, the antimicrobial dosage will be adjusted accordingly.  Current antimicrobial dosage:  Levofloxacin 750mg  x1   Indication: surgical prophylaxis  Renal Function:  Estimated Creatinine Clearance: 23.5 mL/min (A) (by C-G formula based on SCr of 2.1 mg/dL (H)).  Antimicrobial dosage has been changed to:  250mg  x1 and timed closer to 24hr from intraop dose.  Thank you for allowing pharmacy to be a part of this patient's care.  Benetta Spar, PharmD, BCPS, [facility] Clinical Pharmacist 01/29/2019 9:13 PM

## 2019-02-03 NOTE — Anesthesia Postprocedure Evaluation (Signed)
Anesthesia Post Note  Patient: Paula Watson  Procedure(s) Performed: CORONARY ARTERY BYPASS GRAFTING (CABG) times three. Free IMA to LAD. Endoscopic harvesting of left greater saphenous vein to OM1 and Posterior Lateral (PLB). (N/A Chest) TRANSESOPHAGEAL ECHOCARDIOGRAM (TEE) (N/A )     Patient location during evaluation: ICU Anesthesia Type: General Level of consciousness: sedated and patient remains intubated per anesthesia plan Pain management: pain level controlled Vital Signs Assessment: post-procedure vital signs reviewed and stable Respiratory status: patient remains intubated per anesthesia plan Cardiovascular status: stable Postop Assessment: no apparent nausea or vomiting Anesthetic complications: no    Last Vitals:  Vitals:   02/07/2019 2240 02/11/2019 2245  BP: 131/68 138/74  Pulse: (!) 44 (!) 44  Resp: 15 15  Temp: (!) 35.3 C (!) 35.4 C  SpO2: (!) 88% 91%    Last Pain:  Vitals:   02/08/2019 2216  TempSrc: Core  PainSc:                  Audry Pili

## 2019-02-03 NOTE — Progress Notes (Signed)
The chaplain visited at the request of the nurse.  The patient is very emotional and worried about the impending emergency operation.  The chaplain prayed with the patient and provided emotional support.  The patient seemed to have a better disposition.  The family will alert the chaplain if further support is needed.  Brion Aliment Chaplain Resident For questions concerning this note please contact me by pager [phone]

## 2019-02-03 NOTE — H&P (Signed)
History and Physical Interval Note:  01/26/2019 8:29 AM  Paula Watson  has presented today for surgery, with the diagnosis of n stemi.  The various methods of treatment have been discussed with the patient and family. After consideration of risks, benefits and other options for treatment, the patient has consented to  Procedure(s) with comments: LEFT HEART CATH AND CORONARY ANGIOGRAPHY (N/A) - RIGHT AND LEFT IABP Insertion (Right) as a surgical intervention.  The patient's history has been reviewed, patient examined, no change in status, stable for surgery.  I have reviewed the patient's chart and labs.  Questions were answered to the patient's satisfaction.     Wonda Olds

## 2019-02-03 NOTE — Progress Notes (Signed)
Pt ABG results as follows post vent placement w/the following changes per MD Atkins. VT to 9cc (450), increase Rate to 14, PEEP increased to 8. RT will continue to monitor.  Ph  7.386 PCO2 48.5 PaO2 197 HCO3  29.6

## 2019-02-03 NOTE — Brief Op Note (Signed)
01/29/2019  9:53 PM  PATIENT:  Paula Watson  73 y.o. female  PRE-OPERATIVE DIAGNOSIS:  Left main Coronary Artery Disease  POST-OPERATIVE DIAGNOSIS:  Left main Coronary Artery Disease  PROCEDURE:  Procedure(s): CORONARY ARTERY BYPASS GRAFTING (CABG) times three. Free IMA to LAD. Endoscopic harvesting of left greater saphenous vein to OM1 and Posterior Lateral (PLB). (N/A) TRANSESOPHAGEAL ECHOCARDIOGRAM (TEE) (N/A); completion indocyanine green fluorescence imaging (SPY)  SURGEON:  Surgeon(s) and Role:    * Wonda Olds, MD - Primary  PHYSICIAN ASSISTANT: Barrett, Erin  ASSISTANTS: staff   ANESTHESIA:   general  EBL:  900 mL   BLOOD ADMINISTERED:850 CC CELLSAVER  DRAINS: 3 Chest Tube(s) in the mediastinum and left pleural space   LOCAL MEDICATIONS USED:  NONE  SPECIMEN:  No Specimen  DISPOSITION OF SPECIMEN:  N/A  COUNTS:  YES  TOURNIQUET:  * No tourniquets in log *  DICTATION: .Note written in EPIC  PLAN OF CARE: Admit to inpatient   PATIENT DISPOSITION:  ICU - intubated and critically ill.   Delay start of Pharmacological VTE agent (>24hrs) due to surgical blood loss or risk of bleeding: yes  Paula Watson, Paula Watson

## 2019-02-03 NOTE — Progress Notes (Signed)
BP 130/66   Pulse 61   Temp (!) 97.1 F (36.2 C) (Temporal)   Ht 5' 2"  (1.575 m)   Wt 180 lb 9.6 oz (81.9 kg)   SpO2 100%   BMI 33.03 kg/m    Subjective:    Patient ID: Paula Watson, female    DOB: March 12, 1946, 73 y.o.   MRN: [id number]  HPI: Paula Watson is a 73 y.o. female presenting on 01/21/2019 for Diabetes (6 month follow up ), Medical Management of Chronic Issues, and Nausea  This patient comes in today feeling very sick.  She has avoided coming into any of the doctors office because of the COVID-19 restrictions.  She does let her glucose get very out of control.  A year ago it was very well controlled with her A1c is around 6.  She was stopped on Metformin by her nephrologist because of her kidney disease.  She had been trying to just do diet and exercise to control it but knows that her readings can be quite bad.  And it was 10.5.  She has had recurrent episodes of pain in her chest whenever she is at rest and moving around.  She called EMS twice.  They sent EKG readings and there was no significant abnormality seen.  Her glucose was elevated when they came.  She states it has gotten worse.  The pain will hurt through to the back.  I am very concerned about cardiac evaluation and we will have her see cardiology soon as possible. Last also her GERD is much worse.  She states she is having pain up into the back of her chest, which could be related to her heart.  We will go ahead and make an appointment with gastroenterology for soon as possible.  Past Medical History:  Diagnosis Date  . Anemia    Iron deficiency  . Aneurysm of artery ([place])   . Blood transfusion   . Bronchitis    "have had a couple of times in my lifetime"  . Cancer ([place]) 1985   cervical  . Cataract   . CKD (chronic kidney disease) stage 1, GFR 90 ml/min or greater was told by her PCP   Intolerant to ACE according to her PCP  . Diabetes mellitus without complication ([place])   . Dyspnea   . Dysrhythmia     :"SKIPS A BEAT "   SCHEDULED FOR STRESS TEST 2019  . GERD (gastroesophageal reflux disease)   . High cholesterol   . History of hiatal hernia   . History of kidney stones   . Hyperlipidemia   . Hypertension   . Poliomyelitis since 1948   has drop foot on R  . Stroke [facility]) 2007 affected R side  . Vertigo now improved   Relevant past medical, surgical, family and social history reviewed and updated as indicated. Interim medical history since our last visit reviewed. Allergies and medications reviewed and updated. DATA REVIEWED: CHART IN EPIC  Family History reviewed for pertinent findings.  Review of Systems  Constitutional: Positive for diaphoresis and fatigue. Negative for activity change and fever.  HENT: Negative.   Eyes: Negative.   Respiratory: Positive for shortness of breath. Negative for cough and wheezing.   Cardiovascular: Positive for chest pain. Negative for palpitations and leg swelling.  Gastrointestinal: Positive for abdominal pain.  Endocrine: Negative.   Genitourinary: Negative.  Negative for dysuria.  Musculoskeletal: Negative.   Skin: Negative.   Neurological: Positive for weakness.  Allergies as of 02/07/2019      Reactions   Penicillins Hives, Swelling   PATIENT HAS HAD A PCN REACTION WITH IMMEDIATE RASH, FACIAL/TONGUE/THROAT SWELLING, SOB, OR LIGHTHEADEDNESS WITH HYPOTENSION:  #  #  #  YES  #  #  #   Has patient had a PCN reaction causing severe rash involving mucus membranes or skin necrosis: Unknown Has patient had a PCN reaction that required hospitalization: No Has patient had a PCN reaction occurring within the last 10 years: No      Medication List       Accurate as of January 31, 2019  1:01 PM. If you have any questions, ask your nurse or doctor.        albuterol 108 (90 Base) MCG/ACT inhaler Commonly known as: VENTOLIN HFA Inhale 2 puffs into the lungs every 6 (six) hours as needed for wheezing or shortness of breath.   amLODipine  10 MG tablet Commonly known as: NORVASC Take 1 tablet by mouth once daily   aspirin 325 MG EC tablet Take 1 tablet (325 mg total) by mouth daily.   blood glucose meter kit and supplies Dispense based on patient and insurance preference.Check Blood sugar three times a day. DX E11.65   carvedilol 12.5 MG tablet Commonly known as: COREG TAKE 1 TABLET BY MOUTH TWICE DAILY WITH MEALS   clopidogrel 75 MG tablet Commonly known as: PLAVIX Take 1 tablet (75 mg total) by mouth daily.   clotrimazole-betamethasone lotion Commonly known as: LOTRISONE Apply topically 2 (two) times daily.   doxazosin 2 MG tablet Commonly known as: CARDURA Take 2 mg by mouth every evening.   furosemide 20 MG tablet Commonly known as: LASIX Take 1-2 tablets (20-40 mg total) by mouth daily. What changed:   when to take this  additional instructions   hydrocortisone 2.5 % rectal cream Commonly known as: ANUSOL-HC Place 1 application rectally 2 (two) times daily as needed for hemorrhoids.   linaclotide 145 MCG Caps capsule Commonly known as: Linzess Take 1 capsule (145 mcg total) by mouth daily before breakfast. What changed:   when to take this  reasons to take this   losartan 50 MG tablet Commonly known as: COZAAR Take 50 mg by mouth daily.   mupirocin cream 2 % Commonly known as: Bactroban Apply 1 application topically 2 (two) times daily. What changed:   when to take this  reasons to take this   pantoprazole 20 MG tablet Commonly known as: PROTONIX Take 1 tablet (20 mg total) by mouth daily. (Needs to be seen before next refill)   polyethylene glycol-electrolytes 420 g solution Commonly known as: TriLyte Take 4,000 mLs by mouth as directed.   pravastatin 20 MG tablet Commonly known as: PRAVACHOL Take 1 tablet (20 mg total) by mouth daily.   Rayaldee 30 MCG Cpcr Generic drug: Calcifediol ER Take 30 mcg by mouth at bedtime.   sitaGLIPtin 50 MG tablet Commonly known as:  Januvia Take 1 tablet (50 mg total) by mouth daily.          Objective:    BP 130/66   Pulse 61   Temp (!) 97.1 F (36.2 C) (Temporal)   Ht 5' 2"  (1.575 m)   Wt 180 lb 9.6 oz (81.9 kg)   SpO2 100%   BMI 33.03 kg/m   Allergies  Allergen Reactions  . Penicillins Hives and Swelling    PATIENT HAS HAD A PCN REACTION WITH IMMEDIATE RASH, FACIAL/TONGUE/THROAT SWELLING, SOB, OR LIGHTHEADEDNESS WITH  HYPOTENSION:  #  #  #  YES  #  #  #   Has patient had a PCN reaction causing severe rash involving mucus membranes or skin necrosis: Unknown Has patient had a PCN reaction that required hospitalization: No Has patient had a PCN reaction occurring within the last 10 years: No     Wt Readings from Last 3 Encounters:  02/11/2019 177 lb 14.6 oz (80.7 kg)  01/23/2019 180 lb 9.6 oz (81.9 kg)  03/03/18 169 lb 1.5 oz (76.7 kg)    Physical Exam Constitutional:      General: She is not in acute distress.    Appearance: Normal appearance. She is well-developed. She is ill-appearing. She is not diaphoretic.  HENT:     Head: Normocephalic and atraumatic.  Cardiovascular:     Rate and Rhythm: Normal rate.  Pulmonary:     Effort: Pulmonary effort is normal.  Skin:    General: Skin is warm and dry.     Findings: No rash.  Neurological:     Mental Status: She is alert and oriented to person, place, and time.     Deep Tendon Reflexes: Reflexes are normal and symmetric.     Results for orders placed or performed in visit on 02/06/2019  Bayer DCA Hb A1c Waived  Result Value Ref Range   HB A1C (BAYER DCA - WAIVED) 10.5 (H) <7.0 %  Lipid panel  Result Value Ref Range   Cholesterol, Total 147 100 - 199 mg/dL   Triglycerides 116 0 - 149 mg/dL   HDL 44 >39 mg/dL   VLDL Cholesterol Cal 21 5 - 40 mg/dL   LDL Chol Calc (NIH) 82 0 - 99 mg/dL   Chol/HDL Ratio 3.3 0.0 - 4.4 ratio  CMP14+EGFR  Result Value Ref Range   Glucose 274 (H) 65 - 99 mg/dL   BUN 29 (H) 8 - 27 mg/dL   Creatinine, Ser 2.51 (H)  0.57 - 1.00 mg/dL   GFR calc non Af Amer 18 (L) >59 mL/min/1.73   GFR calc Af Amer 21 (L) >59 mL/min/1.73   BUN/Creatinine Ratio 12 12 - 28   Sodium 139 134 - 144 mmol/L   Potassium 3.8 3.5 - 5.2 mmol/L   Chloride 104 96 - 106 mmol/L   CO2 20 20 - 29 mmol/L   Calcium 10.3 8.7 - 10.3 mg/dL   Total Protein 6.0 6.0 - 8.5 g/dL   Albumin 3.7 3.7 - 4.7 g/dL   Globulin, Total 2.3 1.5 - 4.5 g/dL   Albumin/Globulin Ratio 1.6 1.2 - 2.2   Bilirubin Total <0.2 0.0 - 1.2 mg/dL   Alkaline Phosphatase 109 39 - 117 IU/L   AST 11 0 - 40 IU/L   ALT 10 0 - 32 IU/L  CBC with Differential/Platelet  Result Value Ref Range   WBC 10.2 3.4 - 10.8 x10E3/uL   RBC 3.84 3.77 - 5.28 x10E6/uL   Hemoglobin 11.0 (L) 11.1 - 15.9 g/dL   Hematocrit 32.9 (L) 34.0 - 46.6 %   MCV 86 79 - 97 fL   MCH 28.6 26.6 - 33.0 pg   MCHC 33.4 31.5 - 35.7 g/dL   RDW 12.4 11.7 - 15.4 %   Platelets 287 150 - 450 x10E3/uL   Neutrophils 69 Not Estab. %   Lymphs 21 Not Estab. %   Monocytes 6 Not Estab. %   Eos 3 Not Estab. %   Basos 1 Not Estab. %   Neutrophils Absolute 7.0 1.4 -  7.0 x10E3/uL   Lymphocytes Absolute 2.2 0.7 - 3.1 x10E3/uL   Monocytes Absolute 0.7 0.1 - 0.9 x10E3/uL   EOS (ABSOLUTE) 0.3 0.0 - 0.4 x10E3/uL   Basophils Absolute 0.1 0.0 - 0.2 x10E3/uL   Immature Granulocytes 0 Not Estab. %   Immature Grans (Abs) 0.0 0.0 - 0.1 x10E3/uL  Microalbumin / creatinine urine ratio  Result Value Ref Range   Creatinine, Urine 111.2 Not Estab. mg/dL   Microalbumin, Urine 941.3 Not Estab. ug/mL   Microalb/Creat Ratio 846 (H) 0 - 29 mg/g creat      Assessment & Plan:   1. HTN (hypertension), benign - CMP14+EGFR - CBC with Differential/Platelet - Ambulatory referral to Cardiology - DG Chest 2 View; Future  2. Uncontrolled type 2 diabetes mellitus with hyperglycemia (HCC) - Bayer DCA Hb A1c Waived - Lipid panel - Microalbumin / creatinine urine ratio - blood glucose meter kit and supplies; Dispense based on patient  and insurance preference.Check Blood sugar three times a day. DX E11.65  Dispense: 1 each; Refill: 0 - sitaGLIPtin (JANUVIA) 50 MG tablet; Take 1 tablet (50 mg total) by mouth daily.  Dispense: 30 tablet; Refill: 5  3. Chest pain at rest - Ambulatory referral to Cardiology - DG Chest 2 View; Future  4. Chest pain, unspecified type - Ambulatory referral to Gastroenterology  5. Gastroesophageal reflux disease, unspecified whether esophagitis present - Ambulatory referral to Gastroenterology   Continue all other maintenance medications as listed above.  Follow up plan: Return in about 4 weeks (around 02/28/2019) for recheck medications and labs.  Educational handout given for [place] PA-C [facility] [address]  [place], [place] [address] [phone]   01/16/2019, 11:01 PM

## 2019-02-03 NOTE — Progress Notes (Signed)
Pt set up on Nitric per MD order of 20 ppm. RT will continue to monitor.

## 2019-02-03 NOTE — Anesthesia Procedure Notes (Signed)
Procedure Name: Intubation Date/Time: 02/01/2019 2:48 PM Performed by: Oletta Lamas, CRNA Pre-anesthesia Checklist: Patient identified, Emergency Drugs available, Suction available and Patient being monitored Patient Re-evaluated:Patient Re-evaluated prior to induction Oxygen Delivery Method: Circle System Utilized Preoxygenation: Pre-oxygenation with 100% oxygen Induction Type: IV induction Ventilation: Mask ventilation without difficulty Laryngoscope Size: Miller and 2 Grade View: Grade I Tube type: Oral Tube size: 7.0 mm Number of attempts: 1 Airway Equipment and Method: Stylet and Oral airway Placement Confirmation: ETT inserted through vocal cords under direct vision,  positive ETCO2 and breath sounds checked- equal and bilateral Secured at: 22 cm Tube secured with: Tape Dental Injury: Teeth and Oropharynx as per pre-operative assessment  Comments: Left upper incisor loose prior to induction per patient.  No changes or damage post induction/intubation

## 2019-02-03 NOTE — Anesthesia Procedure Notes (Signed)
Arterial Line Insertion Start/End10/04/2018 2:42 PM, 02/03/2019 2:46 PM Performed by: Josephine Igo, CRNA, CRNA  Preanesthetic checklist: patient identified, IV checked, site marked, risks and benefits discussed, surgical consent, monitors and equipment checked, pre-op evaluation, timeout performed and anesthesia consent radial was placed Catheter size: 20 G Hand hygiene performed , maximum sterile barriers used  and Seldinger technique used Allen's test indicative of satisfactory collateral circulation Attempts: 1 Procedure performed without using ultrasound guided technique. Following insertion, dressing applied and Biopatch. Post procedure assessment: normal  Patient tolerated the procedure well with no immediate complications.

## 2019-02-03 NOTE — Anesthesia Procedure Notes (Signed)
Central Venous Catheter Insertion Performed by: Murvin Natal, MD, anesthesiologist Start/End10/24/2020 2:50 PM, 02/07/2019 3:10 PM Patient location: OR. Preanesthetic checklist: patient identified, IV checked, site marked, risks and benefits discussed, surgical consent, monitors and equipment checked, pre-op evaluation, timeout performed and anesthesia consent Position: Trendelenburg Hand hygiene performed , maximum sterile barriers used  and Seldinger technique used Catheter size: 9 Fr Total catheter length 12. PA cath was placed.MAC introducer Swan type:thermodilution PA Cath depth:45 Procedure performed using ultrasound guided technique. Ultrasound Notes:anatomy identified, needle tip was noted to be adjacent to the nerve/plexus identified and no ultrasound evidence of intravascular and/or intraneural injection Attempts: 2 Following insertion, line sutured, dressing applied and Biopatch. Post procedure assessment: blood return through all ports, free fluid flow and no air  Patient tolerated the procedure well with no immediate complications.

## 2019-02-03 NOTE — Anesthesia Preprocedure Evaluation (Addendum)
Anesthesia Evaluation  Patient identified by MRN, date of birth, ID band Patient awake    Reviewed: Allergy & Precautions, NPO status , Patient's Chart, lab work & pertinent test results  Airway Mallampati: IV  TM Distance: >3 FB Neck ROM: Full    Dental  (+) Loose,    Pulmonary COPD, former smoker,    Pulmonary exam normal breath sounds clear to auscultation       Cardiovascular hypertension, Pt. on medications and Pt. on home beta blockers + CAD, + Past MI and + Peripheral Vascular Disease  Normal cardiovascular exam Rhythm:Regular Rate:Normal  CATH: Ost LM to Dist LM lesion is 99% stenosed. Mid LAD lesion is 40% stenosed. Prox Cx to Mid Cx lesion is 80% stenosed. Prox RCA lesion is 80% stenosed. Mid RCA lesion is 80% stenosed. RPDA lesion is 60% stenosed.  1. Critical left main and significant RCA disease. The coronary arteries  are heavily calcified. 2. Left ventricular angiography was not performed. EF was 50% by echo. 3. LVEDP was significantly elevated at 32 mmHg. However, by right heart  cath, pulmonary capillary wedge pressure was only 11 mmHg with PA pressure  of 43/12. Cardiac output was 5.32 with a cardiac index of 2.89. Right  heart catheterization was done after placing intra-aortic balloon pump  which might have improved the hemodynamics. 4. Successful intra-aortic balloon pump placement via the right common femoral artery.  ECHO:  1. Left ventricular ejection fraction, by visual estimation, is 50 to 55%. Normal left ventricular size. Left ventricular septal wall thickness was severely increased. Severely increased left ventricular posterior wall thickness. Inadequate for Regional  wall motion assessment.  2. Global right ventricle has normal systolic function.The right ventricular size is normal. No increase in right ventricular wall thickness.  3. Left atrial size was normal.  4. Right atrial size was  normal.  5. The mitral valve is normal in structure. Trace mitral valve regurgitation. No evidence of mitral stenosis.  6. The tricuspid valve is normal in structure. Tricuspid valve regurgitation is trivial.  7. The aortic valve is tricuspid Aortic valve regurgitation was not visualized by color flow Doppler. Moderate aortic valve sclerosis/calcification without any evidence of aortic stenosis.  8. The pulmonic valve was normal in structure. Pulmonic valve regurgitation is not visualized by color flow Doppler.  9. Normal pulmonary artery systolic pressure. 10. The inferior vena cava is normal in size with greater than 50% respiratory variability, suggesting right atrial pressure of 3 mmHg. 11. Left ventricular diastolic Doppler parameters are consistent with impaired relaxation pattern of LV diastolic filling. 12. Recommend limited study with Definity contrast.    Neuro/Psych Vertigo Poliomyelitis Left sided weakness Right foot drop TIA Neuromuscular disease CVA, Residual Symptoms negative psych ROS   GI/Hepatic Neg liver ROS, hiatal hernia, GERD  Medicated and Controlled,  Endo/Other  diabetes, Oral Hypoglycemic Agents  Renal/GU CRFRenal disease     Musculoskeletal negative musculoskeletal ROS (+)   Abdominal (+) + obese,   Peds  Hematology  (+) anemia , HLD   Anesthesia Other Findings CAD  Reproductive/Obstetrics                            Anesthesia Physical Anesthesia Plan  ASA: IV  Anesthesia Plan: General   Post-op Pain Management:    Induction: Intravenous  PONV Risk Score and Plan: 3 and Ondansetron, Dexamethasone, Midazolam and Treatment may vary due to age or medical condition  Airway Management Planned: Oral  ETT  Additional Equipment: Arterial line, CVP, PA Cath, Ultrasound Guidance Line Placement and Precordial Doppler Monitoring  Intra-op Plan:   Post-operative Plan: Post-operative intubation/ventilation  Informed  Consent: I have reviewed the patients History and Physical, chart, labs and discussed the procedure including the risks, benefits and alternatives for the proposed anesthesia with the patient or authorized representative who has indicated his/her understanding and acceptance.    Discussed DNR with patient.   Dental advisory given  Plan Discussed with: CRNA  Anesthesia Plan Comments: (IABP 1:1 High probability of left maxillary dental damage discussed )       Anesthesia Quick Evaluation

## 2019-02-03 NOTE — Progress Notes (Signed)
Subjective:  Went to cath lab- found diffuse CAD- then had recurrence of CP- IABP placed-  CTS saw- tentatively scheduled for CABG later today.  400 of UOP recorded pre procedure, then nursing reports at least 300 plus one incont episode- amazingly BUN and crt about the same as yesterday - she is alert   Objective Vital signs in last 24 hours: Vitals:   01/22/2019 0445 02/02/2019 0500 02/09/2019 0515 01/25/2019 0530  BP:  (!) 155/83    Pulse: 76 78 75 77  Resp: 15 14 16 16   Temp:      TempSrc:      SpO2: 95% 97% 97% 97%  Weight:      Height:       Weight change:   Intake/Output Summary (Last 24 hours) at 02/05/2019 3818 Last data filed at 02/09/2019 0500 Gross per 24 hour  Intake 2085.82 ml  Output 400 ml  Net 1685.82 ml     Assessment/Plan: 73 year old white female with longstanding diabetes mellitus with complications, likely diabetic nephropathy, diffuse vascular disease now presenting with unstable angina/acute coronary syndrome 1.Renal-patient has known CKD likely thought due to diabetic nephropathy.  Creatinine in the low twos earlier this year.   There has been worsening in the setting of [place]/now s/p cath.  Urine output down some.   urinalysis not c/w an acute GN.  I suspect it is the hypotension that is giving her acute kidney injury/ATN.   losartan on hold.  I have discontinued the amlodipine and Cardura as well. BP is better with the IABP.  So far renal function has maintained. No indications for HD at this time- hoping renal function can maintain but with another insult of CABG coming not sure.  I have told her that she is at significant risk for requiring dialysis post procedure, she seems to understand- will continue to follow UOP and labs closely 2. Hypertension/volume  -volume status seems okay.  Blood pressure had been low at times on a nitroglycerin drip with intermittent chest pain.  Better with IABP- BP meds on hold.   Getting IVF at 75 per hour right now, is OK 3. Anemia   -hemoglobin has dropped some.   iron stores low, gave dose of iron yesterday and  dose of ESA      Louis Meckel    Labs: Basic Metabolic Panel: Recent Labs  Lab 02/01/19 0249 02/01/2019 0235 01/21/2019 1406 01/20/2019 1408 01/31/2019 0052  NA 138 135 137 137 135  K 3.6 4.1 3.7 3.7 3.6  CL 108 107  --   --  105  CO2 19* 18*  --   --  21*  GLUCOSE 196* 210*  --   --  244*  BUN 30* 33*  --   --  31*  CREATININE 2.81* 3.20*  --   --  2.98*  CALCIUM 8.9 8.6*  --   --  8.1*  PHOS  --  4.3  --   --  3.5   Liver Function Tests: Recent Labs  Lab 01/25/2019 1028 01/25/2019 1402 01/21/2019 0235 02/09/2019 0052  AST 11 15  --   --   ALT 10 13  --   --   ALKPHOS 109 82  --   --   BILITOT <0.2 0.5  --   --   PROT 6.0 6.5  --   --   ALBUMIN 3.7 3.3* 2.8* 2.6*   No results for input(s): LIPASE, AMYLASE in the last 168 hours. No results for  input(s): AMMONIA in the last 168 hours. CBC: Recent Labs  Lab 02/12/2019 1028  01/21/2019 1402 02/01/19 0259 01/13/2019 0235 01/14/2019 1406 02/04/2019 1408 02/08/2019 0052  WBC 10.2   < > 10.8* 10.2 11.7*  --   --  10.1  NEUTROABS 7.0  --  8.0*  --   --   --   --   --   HGB 11.0*   < > 11.2* 9.6* 9.4* 8.5* 8.8* 8.7*  HCT 32.9*   < > 35.9* 29.9* 30.6* 25.0* 26.0* 26.1*  MCV 86  --  88.4 87.7 91.6  --   --  86.4  PLT 287   < > 263 258 272  --   --  224   < > = values in this interval not displayed.   Cardiac Enzymes: No results for input(s): CKTOTAL, CKMB, CKMBINDEX, TROPONINI in the last 168 hours. CBG: Recent Labs  Lab 02/08/2019 0646 01/20/2019 1127 01/25/2019 1703 01/13/2019 2132 01/24/2019 0629  GLUCAP 172* 126* 198* 300* 276*    Iron Studies:  Recent Labs    02/06/2019 0235  IRON 46  TIBC 237*  FERRITIN 54   Studies/Results: Ct Chest Wo Contrast  Result Date: 02/01/2019 CLINICAL DATA:  Aortic disease, nontraumatic. Preop CABG. Chest pain. EXAM: CT CHEST WITHOUT CONTRAST TECHNIQUE: Multidetector CT imaging of the chest was performed  following the standard protocol without IV contrast. COMPARISON:  Chest radiograph 01/25/2019 FINDINGS: Cardiovascular: Intra-aortic balloon pump in place with tip in the proximal descending thoracic aorta. No aortic aneurysm. There is dense diffuse aortic atherosclerosis throughout the thoracic aorta and branch vessels. Chunky calcification in the proximal left subclavian causes some degree of luminal narrowing dense coronary artery calcifications. Heart is normal in size. No pericardial effusion. A left subclavian to carotid stent is visualized, however patency not assessed on noncontrast exam. Mediastinum/Nodes: Small mediastinal lymph nodes are not enlarged by size criteria. No evidence of hilar adenopathy. Small hiatal hernia. Lungs/Pleura: Mild emphysema. Central bronchial thickening. Dependent atelectasis within both lung bases and perifissural left upper lobe. Small amount of fluid in the right major fissure. Trace pleural thickening. No pulmonary edema. No focal consolidation. No pulmonary mass. Trachea and mainstem bronchi are patent. Upper Abdomen: Fatty atrophy of the pancreas. Left renal atrophy and sister partially included. There is excreted IV contrast in the right renal collecting system. Atherosclerosis of upper abdominal vasculature. Musculoskeletal: There are no acute or suspicious osseous abnormalities. Bones are under mineralized. Scattered Schmorl's nodes in the spine. Mild disc calcification in the mid and lower thoracic spine. IMPRESSION: 1. Normal caliber thoracic aorta with dense atherosclerosis throughout. Intra-aortic balloon pump in place. 2. Dense coronary artery calcifications versus stents. Normal heart size. 3. Mild emphysema and bronchial thickening. Dependent atelectasis in the lung bases with trace pleural thickening. Aortic Atherosclerosis (ICD10-I70.0) and Emphysema (ICD10-J43.9). Electronically Signed   By: Keith Rake M.D.   On: 01/15/2019 22:54   Vas US Doppler Pre  Cabg  Result Date: 01/29/2019 PREOPERATIVE VASCULAR EVALUATION  Indications:      Pre-surgical evaluation. Risk Factors:     Hypertension, hyperlipidemia, Diabetes, coronary artery                   disease. Limitations:      Balloon pump. Comparison Study: No prior study. Performing Technologist: Maudry Mayhew MHA, RVT, RDCS, RDMS  Examination Guidelines: A complete evaluation includes B-mode imaging, spectral Doppler, color Doppler, and power Doppler as needed of all accessible portions of each vessel. Bilateral  testing is considered an integral part of a complete examination. Limited examinations for reoccurring indications may be performed as noted.  Right Carotid Findings: +----------+-------+-------+--------+---------------------------------+--------+           PSV    EDV    StenosisDescribe                         Comments           cm/s   cm/s                                                     +----------+-------+-------+--------+---------------------------------+--------+ CCA Prox                        heterogenous and irregular       patent   +----------+-------+-------+--------+---------------------------------+--------+ CCA Distal                      heterogenous, irregular and      patent                                   calcific                                  +----------+-------+-------+--------+---------------------------------+--------+ +----------+--------+-------+--------+------------+           PSV cm/sEDV cmsDescribeArm Pressure +----------+--------+-------+--------+------------+ Subclavian               patent               +----------+--------+-------+--------+------------+ +---------+--------+--------+--------------------+ VertebralPSV cm/sEDV cm/spatent and Antegrade +---------+--------+--------+--------------------+ Right ICA stent is patent. Unable to assess any grade of stenosis by velocity due to balloon pump. Left Carotid  Findings: +----------+--------+--------+--------+--------+--------+           PSV cm/sEDV cm/sStenosisDescribeComments +----------+--------+--------+--------+--------+--------+ CCA Prox                  Occluded                 +----------+--------+--------+--------+--------+--------+ CCA Distal                Occluded                 +----------+--------+--------+--------+--------+--------+ ICA Prox                  Occluded                 +----------+--------+--------+--------+--------+--------+ ECA                                       patent   +----------+--------+--------+--------+--------+--------+ +----------+--------+--------+--------+------------+ SubclavianPSV cm/sEDV cm/sDescribeArm Pressure +----------+--------+--------+--------+------------+                           patent  143          +----------+--------+--------+--------+------------+ +---------+--------+--------+--------------------+ VertebralPSV cm/sEDV cm/sAntegrade and patent +---------+--------+--------+--------------------+  ABI Findings: +--------+------------------+-----+--------+-----------------------------------+ Right   Rt Pressure (mmHg)IndexWaveformComment                             +--------+------------------+-----+--------+-----------------------------------+  Brachial                       patent  Unable to obtain pressure due to TR                                        band.                               +--------+------------------+-----+--------+-----------------------------------+ PTA                            absent                                      +--------+------------------+-----+--------+-----------------------------------+ DP                             absent                                      +--------+------------------+-----+--------+-----------------------------------+ +--------+------------------+-----+--------+-------+ Left     Lt Pressure (mmHg)IndexWaveformComment +--------+------------------+-----+--------+-------+ [id number]                    patent          +--------+------------------+-----+--------+-------+ PTA     111               0.78         patent  +--------+------------------+-----+--------+-------+ DP                             absent          +--------+------------------+-----+--------+-------+ +-------+---------------+----------------+ ABI/TBIToday's ABI/TBIPrevious ABI/TBI +-------+---------------+----------------+ Right  -                               +-------+---------------+----------------+ Left   0.78                            +-------+---------------+----------------+  Right Doppler Findings: +-----------+--------+-----+-------+-----------------------------------------+ Site       PressureIndexDopplerComments                                  +-----------+--------+-----+-------+-----------------------------------------+ Brachial                patent Unable to obtain pressure due to TR band. +-----------+--------+-----+-------+-----------------------------------------+ Radial                  patent                                           +-----------+--------+-----+-------+-----------------------------------------+ Ulnar                   patent                                           +-----------+--------+-----+-------+-----------------------------------------+  Palmar Arch                    Unable to assess due to TR band           +-----------+--------+-----+-------+-----------------------------------------+  Left Doppler Findings: +-----------+--------+-----+-------+---------------------+ Site       PressureIndexDopplerComments              +-----------+--------+-----+-------+---------------------+ Brachial   143          patent                       +-----------+--------+-----+-------+---------------------+ Radial                   patent                       +-----------+--------+-----+-------+---------------------+ Ulnar                   patent                       +-----------+--------+-----+-------+---------------------+ Palmar Arch                    Within normal limits. +-----------+--------+-----+-------+---------------------+  Summary: Right Carotid: Right ICA stent is patent. Unable to assess any grade of stenosis                by velocity due to balloon pump. Left Carotid: Left CCA and ICA appear to be occluded by B mode, color Doppler,               and pulsed wave Doppler. Vertebrals:  Bilateral vertebral arteries demonstrate antegrade flow. Subclavians: Bilateral subclavian arteries are patent with antegrade flow. Right ABI: Unable to calculate ABI due to absent pulses. Left ABI: Resting left ankle-brachial index indicates moderate left lower extremity arterial disease.     Preliminary    Medications: Infusions: . sodium chloride    . dexmedetomidine    . DOPamine    . heparin 30,000 units/NS 1000 mL solution for CELLSAVER    . heparin 700 Units/hr (01/22/2019 0500)  . levofloxacin (LEVAQUIN) IV    . milrinone    . nitroGLYCERIN 115 mcg/min (01/15/2019 0500)  . nitroGLYCERIN    . norepinephrine (LEVOPHED) Adult infusion    . sodium bicarbonate 150 mEq in dextrose 5% 1000 mL 75 mL/hr at 01/13/2019 0500  . tranexamic acid (CYKLOKAPRON) infusion ([facility])    . vancomycin      Scheduled Medications: . aspirin  81 mg Oral Daily  . atorvastatin  40 mg Oral q1800  . bisacodyl  5 mg Oral Once  . Calcifediol ER  30 mcg Oral QHS  . carvedilol  12.5 mg Oral BID WC  . Chlorhexidine Gluconate Cloth  6 each Topical Daily  . darbepoetin (ARANESP) injection - NON-DIALYSIS  60 mcg Subcutaneous Q Wed-1800  . epinephrine  0-10 mcg/min Intravenous To OR  . heparin-papaverine-plasmalyte irrigation   Irrigation To OR  . insulin aspart  0-5 Units Subcutaneous QHS  . insulin aspart  0-9 Units Subcutaneous  TID WC  . insulin   Intravenous To OR  . magnesium sulfate  40 mEq Other To OR  .  morphine injection  2 mg Intravenous Once  . pantoprazole  20 mg Oral Daily  . phenylephrine  30-200 mcg/min Intravenous To OR  . potassium chloride  80 mEq Other To OR  . sodium chloride flush  3 mL  Intravenous Q12H  . sodium chloride flush  3 mL Intravenous Q12H  . tranexamic acid  15 mg/kg Intravenous To OR  . tranexamic acid  2 mg/kg Intracatheter To OR  . vancomycin 1000 mg in NS (1000 ml) irrigation for Dr. Roxy Manns case   Irrigation To OR    have reviewed scheduled and prn medications.  Physical Exam: General: resting at present- c/o back pain with lying flat Heart: RRR Lungs: ant clear Abdomen: soft, non tender Extremities: min edema-  Has IABP in place     02/09/2019,6:35 AM  LOS: 3 days

## 2019-02-04 ENCOUNTER — Inpatient Hospital Stay (HOSPITAL_COMMUNITY): Payer: Medicare Other

## 2019-02-04 ENCOUNTER — Encounter (HOSPITAL_COMMUNITY): Payer: Self-pay | Admitting: Cardiothoracic Surgery

## 2019-02-04 DIAGNOSIS — R570 Cardiogenic shock: Secondary | ICD-10-CM

## 2019-02-04 LAB — POCT I-STAT 7, (LYTES, BLD GAS, ICA,H+H)
Acid-Base Excess: 4 mmol/L — ABNORMAL HIGH (ref 0.0–2.0)
Acid-Base Excess: 8 mmol/L — ABNORMAL HIGH (ref 0.0–2.0)
Acid-Base Excess: 9 mmol/L — ABNORMAL HIGH (ref 0.0–2.0)
Acid-Base Excess: 8 mmol/L — ABNORMAL HIGH (ref 0.0–2.0)
Acid-Base Excess: 10 mmol/L — ABNORMAL HIGH (ref 0.0–2.0)
Acid-Base Excess: 7 mmol/L — ABNORMAL HIGH (ref 0.0–2.0)
Acid-base deficit: 1 mmol/L (ref 0.0–2.0)
Acid-base deficit: 2 mmol/L (ref 0.0–2.0)
Acid-base deficit: 4 mmol/L — ABNORMAL HIGH (ref 0.0–2.0)
Bicarbonate: 24.6 mmol/L (ref 20.0–28.0)
Bicarbonate: 29.6 mmol/L — ABNORMAL HIGH (ref 20.0–28.0)
Bicarbonate: 23.4 mmol/L (ref 20.0–28.0)
Bicarbonate: 24.1 mmol/L (ref 20.0–28.0)
Bicarbonate: 31.2 mmol/L — ABNORMAL HIGH (ref 20.0–28.0)
Bicarbonate: 31.9 mmol/L — ABNORMAL HIGH (ref 20.0–28.0)
Bicarbonate: 30.4 mmol/L — ABNORMAL HIGH (ref 20.0–28.0)
Bicarbonate: 32.9 mmol/L — ABNORMAL HIGH (ref 20.0–28.0)
Bicarbonate: 30 mmol/L — ABNORMAL HIGH (ref 20.0–28.0)
Calcium, Ion: 1.2 mmol/L (ref 1.15–1.40)
Calcium, Ion: 0.93 mmol/L — ABNORMAL LOW (ref 1.15–1.40)
Calcium, Ion: 1.01 mmol/L — ABNORMAL LOW (ref 1.15–1.40)
Calcium, Ion: 0.86 mmol/L — CL (ref 1.15–1.40)
Calcium, Ion: 1.03 mmol/L — ABNORMAL LOW (ref 1.15–1.40)
Calcium, Ion: 0.99 mmol/L — ABNORMAL LOW (ref 1.15–1.40)
Calcium, Ion: 1.04 mmol/L — ABNORMAL LOW (ref 1.15–1.40)
Calcium, Ion: 1.05 mmol/L — ABNORMAL LOW (ref 1.15–1.40)
Calcium, Ion: 1.05 mmol/L — ABNORMAL LOW (ref 1.15–1.40)
HCT: 27 % — ABNORMAL LOW (ref 36.0–46.0)
HCT: 20 % — ABNORMAL LOW (ref 36.0–46.0)
HCT: 23 % — ABNORMAL LOW (ref 36.0–46.0)
HCT: 26 % — ABNORMAL LOW (ref 36.0–46.0)
HCT: 26 % — ABNORMAL LOW (ref 36.0–46.0)
HCT: 25 % — ABNORMAL LOW (ref 36.0–46.0)
HCT: 23 % — ABNORMAL LOW (ref 36.0–46.0)
HCT: 23 % — ABNORMAL LOW (ref 36.0–46.0)
HCT: 22 % — ABNORMAL LOW (ref 36.0–46.0)
Hemoglobin: 9.2 g/dL — ABNORMAL LOW (ref 12.0–15.0)
Hemoglobin: 6.8 g/dL — CL (ref 12.0–15.0)
Hemoglobin: 7.8 g/dL — ABNORMAL LOW (ref 12.0–15.0)
Hemoglobin: 8.8 g/dL — ABNORMAL LOW (ref 12.0–15.0)
Hemoglobin: 8.8 g/dL — ABNORMAL LOW (ref 12.0–15.0)
Hemoglobin: 8.5 g/dL — ABNORMAL LOW (ref 12.0–15.0)
Hemoglobin: 7.8 g/dL — ABNORMAL LOW (ref 12.0–15.0)
Hemoglobin: 7.8 g/dL — ABNORMAL LOW (ref 12.0–15.0)
Hemoglobin: 7.5 g/dL — ABNORMAL LOW (ref 12.0–15.0)
O2 Saturation: 97 %
O2 Saturation: 100 %
O2 Saturation: 100 %
O2 Saturation: 100 %
O2 Saturation: 100 %
O2 Saturation: 100 %
O2 Saturation: 97 %
O2 Saturation: 100 %
O2 Saturation: 99 %
Patient temperature: 35.6
Patient temperature: 35.3
Patient temperature: 36.9
Patient temperature: 36.1
Patient temperature: 34.5
Patient temperature: 34.6
Patient temperature: 36.6
Patient temperature: 36.8
Potassium: 3.4 mmol/L — ABNORMAL LOW (ref 3.5–5.1)
Potassium: 3.8 mmol/L (ref 3.5–5.1)
Potassium: 4.4 mmol/L (ref 3.5–5.1)
Potassium: 3.8 mmol/L (ref 3.5–5.1)
Potassium: 3.2 mmol/L — ABNORMAL LOW (ref 3.5–5.1)
Potassium: 2.9 mmol/L — ABNORMAL LOW (ref 3.5–5.1)
Potassium: 2.9 mmol/L — ABNORMAL LOW (ref 3.5–5.1)
Potassium: 3.3 mmol/L — ABNORMAL LOW (ref 3.5–5.1)
Potassium: 4 mmol/L (ref 3.5–5.1)
Sodium: 139 mmol/L (ref 135–145)
Sodium: 142 mmol/L (ref 135–145)
Sodium: 139 mmol/L (ref 135–145)
Sodium: 145 mmol/L (ref 135–145)
Sodium: 144 mmol/L (ref 135–145)
Sodium: 145 mmol/L (ref 135–145)
Sodium: 144 mmol/L (ref 135–145)
Sodium: 143 mmol/L (ref 135–145)
Sodium: 141 mmol/L (ref 135–145)
TCO2: 26 mmol/L (ref 22–32)
TCO2: 31 mmol/L (ref 22–32)
TCO2: 25 mmol/L (ref 22–32)
TCO2: 26 mmol/L (ref 22–32)
TCO2: 32 mmol/L (ref 22–32)
TCO2: 33 mmol/L — ABNORMAL HIGH (ref 22–32)
TCO2: 31 mmol/L (ref 22–32)
TCO2: 34 mmol/L — ABNORMAL HIGH (ref 22–32)
TCO2: 31 mmol/L (ref 22–32)
pCO2 arterial: 40.5 mmHg (ref 32.0–48.0)
pCO2 arterial: 48.5 mmHg — ABNORMAL HIGH (ref 32.0–48.0)
pCO2 arterial: 39.9 mmHg (ref 32.0–48.0)
pCO2 arterial: 57.2 mmHg — ABNORMAL HIGH (ref 32.0–48.0)
pCO2 arterial: 32.9 mmHg (ref 32.0–48.0)
pCO2 arterial: 31.8 mmHg — ABNORMAL LOW (ref 32.0–48.0)
pCO2 arterial: 32.4 mmHg (ref 32.0–48.0)
pCO2 arterial: 34.5 mmHg (ref 32.0–48.0)
pCO2 arterial: 35 mmHg (ref 32.0–48.0)
pH, Arterial: 7.386 (ref 7.350–7.450)
pH, Arterial: 7.386 (ref 7.350–7.450)
pH, Arterial: 7.376 (ref 7.350–7.450)
pH, Arterial: 7.228 — ABNORMAL LOW (ref 7.350–7.450)
pH, Arterial: 7.577 — ABNORMAL HIGH (ref 7.350–7.450)
pH, Arterial: 7.601 (ref 7.350–7.450)
pH, Arterial: 7.579 — ABNORMAL HIGH (ref 7.350–7.450)
pH, Arterial: 7.586 — ABNORMAL HIGH (ref 7.350–7.450)
pH, Arterial: 7.541 — ABNORMAL HIGH (ref 7.350–7.450)
pO2, Arterial: 84 mmHg (ref 83.0–108.0)
pO2, Arterial: 197 mmHg — ABNORMAL HIGH (ref 83.0–108.0)
pO2, Arterial: 253 mmHg — ABNORMAL HIGH (ref 83.0–108.0)
pO2, Arterial: 291 mmHg — ABNORMAL HIGH (ref 83.0–108.0)
pO2, Arterial: 289 mmHg — ABNORMAL HIGH (ref 83.0–108.0)
pO2, Arterial: 217 mmHg — ABNORMAL HIGH (ref 83.0–108.0)
pO2, Arterial: 71 mmHg — ABNORMAL LOW (ref 83.0–108.0)
pO2, Arterial: 141 mmHg — ABNORMAL HIGH (ref 83.0–108.0)
pO2, Arterial: 129 mmHg — ABNORMAL HIGH (ref 83.0–108.0)

## 2019-02-04 LAB — BLOOD GAS, ARTERIAL
Acid-base deficit: 7.6 mmol/L — ABNORMAL HIGH (ref 0.0–2.0)
Bicarbonate: 18.5 mmol/L — ABNORMAL LOW (ref 20.0–28.0)
Drawn by: 347191
FIO2: 1
MECHVT: 450 mL
Nitric Oxide: 20
O2 Saturation: 99.1 %
PEEP: 8 cmH2O
Patient temperature: 98.6
Pressure support: 10 cmH2O
RATE: 14 resp/min
pCO2 arterial: 44.6 mmHg (ref 32.0–48.0)
pH, Arterial: 7.24 — ABNORMAL LOW (ref 7.350–7.450)
pO2, Arterial: 230 mmHg — ABNORMAL HIGH (ref 83.0–108.0)

## 2019-02-04 LAB — GLUCOSE, CAPILLARY
Glucose-Capillary: 101 mg/dL — ABNORMAL HIGH (ref 70–99)
Glucose-Capillary: 102 mg/dL — ABNORMAL HIGH (ref 70–99)
Glucose-Capillary: 108 mg/dL — ABNORMAL HIGH (ref 70–99)
Glucose-Capillary: 111 mg/dL — ABNORMAL HIGH (ref 70–99)
Glucose-Capillary: 113 mg/dL — ABNORMAL HIGH (ref 70–99)
Glucose-Capillary: 121 mg/dL — ABNORMAL HIGH (ref 70–99)
Glucose-Capillary: 124 mg/dL — ABNORMAL HIGH (ref 70–99)
Glucose-Capillary: 145 mg/dL — ABNORMAL HIGH (ref 70–99)
Glucose-Capillary: 145 mg/dL — ABNORMAL HIGH (ref 70–99)
Glucose-Capillary: 176 mg/dL — ABNORMAL HIGH (ref 70–99)
Glucose-Capillary: 85 mg/dL (ref 70–99)
Glucose-Capillary: 91 mg/dL (ref 70–99)
Glucose-Capillary: 93 mg/dL (ref 70–99)
Glucose-Capillary: 97 mg/dL (ref 70–99)
Glucose-Capillary: 99 mg/dL (ref 70–99)
Glucose-Capillary: 99 mg/dL (ref 70–99)

## 2019-02-04 LAB — COMPREHENSIVE METABOLIC PANEL
ALT: 1425 U/L — ABNORMAL HIGH (ref 0–44)
ALT: 2916 U/L — ABNORMAL HIGH (ref 0–44)
AST: 1763 U/L — ABNORMAL HIGH (ref 15–41)
AST: 5358 U/L — ABNORMAL HIGH (ref 15–41)
Albumin: 2.8 g/dL — ABNORMAL LOW (ref 3.5–5.0)
Albumin: 2.7 g/dL — ABNORMAL LOW (ref 3.5–5.0)
Alkaline Phosphatase: 32 U/L — ABNORMAL LOW (ref 38–126)
Alkaline Phosphatase: 46 U/L (ref 38–126)
Anion gap: 14 (ref 5–15)
Anion gap: 12 (ref 5–15)
BUN: 19 mg/dL (ref 8–23)
BUN: 18 mg/dL (ref 8–23)
CO2: 25 mmol/L (ref 22–32)
CO2: 25 mmol/L (ref 22–32)
Calcium: 8.3 mg/dL — ABNORMAL LOW (ref 8.9–10.3)
Calcium: 7.9 mg/dL — ABNORMAL LOW (ref 8.9–10.3)
Chloride: 106 mmol/L (ref 98–111)
Chloride: 104 mmol/L (ref 98–111)
Creatinine, Ser: 2.4 mg/dL — ABNORMAL HIGH (ref 0.44–1.00)
Creatinine, Ser: 2.1 mg/dL — ABNORMAL HIGH (ref 0.44–1.00)
GFR calc Af Amer: 22 mL/min — ABNORMAL LOW (ref >60)
GFR calc Af Amer: 26 mL/min — ABNORMAL LOW (ref >60)
GFR calc non Af Amer: 19 mL/min — ABNORMAL LOW (ref >60)
GFR calc non Af Amer: 23 mL/min — ABNORMAL LOW (ref >60)
Glucose, Bld: 94 mg/dL (ref 70–99)
Glucose, Bld: 108 mg/dL — ABNORMAL HIGH (ref 70–99)
Potassium: 3.2 mmol/L — ABNORMAL LOW (ref 3.5–5.1)
Potassium: 3.9 mmol/L (ref 3.5–5.1)
Sodium: 145 mmol/L (ref 135–145)
Sodium: 141 mmol/L (ref 135–145)
Total Bilirubin: 2.6 mg/dL — ABNORMAL HIGH (ref 0.3–1.2)
Total Bilirubin: 2.6 mg/dL — ABNORMAL HIGH (ref 0.3–1.2)
Total Protein: 4.2 g/dL — ABNORMAL LOW (ref 6.5–8.1)
Total Protein: 4.4 g/dL — ABNORMAL LOW (ref 6.5–8.1)

## 2019-02-04 LAB — PREPARE FRESH FROZEN PLASMA
Unit division: 0
Unit division: 0

## 2019-02-04 LAB — CBC
HCT: 26.2 % — ABNORMAL LOW (ref 36.0–46.0)
Hemoglobin: 8.9 g/dL — ABNORMAL LOW (ref 12.0–15.0)
MCH: 30 pg (ref 26.0–34.0)
MCHC: 34 g/dL (ref 30.0–36.0)
MCV: 88.2 fL (ref 80.0–100.0)
Platelets: 92 10*3/uL — ABNORMAL LOW (ref 150–400)
RBC: 2.97 MIL/uL — ABNORMAL LOW (ref 3.87–5.11)
RDW: 13.9 % (ref 11.5–15.5)
WBC: 12.9 10*3/uL — ABNORMAL HIGH (ref 4.0–10.5)
nRBC: 0.4 % — ABNORMAL HIGH (ref 0.0–0.2)

## 2019-02-04 LAB — PREPARE PLATELET PHERESIS
Unit division: 0
Unit division: 0

## 2019-02-04 LAB — BASIC METABOLIC PANEL
Anion gap: 11 (ref 5–15)
BUN: 20 mg/dL (ref 8–23)
CO2: 22 mmol/L (ref 22–32)
Calcium: 7 mg/dL — ABNORMAL LOW (ref 8.9–10.3)
Chloride: 106 mmol/L (ref 98–111)
Creatinine, Ser: 2.3 mg/dL — ABNORMAL HIGH (ref 0.44–1.00)
GFR calc Af Amer: 24 mL/min — ABNORMAL LOW (ref >60)
GFR calc non Af Amer: 20 mL/min — ABNORMAL LOW (ref >60)
Glucose, Bld: 171 mg/dL — ABNORMAL HIGH (ref 70–99)
Potassium: 4.4 mmol/L (ref 3.5–5.1)
Sodium: 139 mmol/L (ref 135–145)

## 2019-02-04 LAB — CBC WITH DIFFERENTIAL/PLATELET
Abs Immature Granulocytes: 0.11 10*3/uL — ABNORMAL HIGH (ref 0.00–0.07)
Basophils Absolute: 0 10*3/uL (ref 0.0–0.1)
Basophils Relative: 0 %
Eosinophils Absolute: 0 10*3/uL (ref 0.0–0.5)
Eosinophils Relative: 0 %
HCT: 24.1 % — ABNORMAL LOW (ref 36.0–46.0)
Hemoglobin: 8.6 g/dL — ABNORMAL LOW (ref 12.0–15.0)
Immature Granulocytes: 1 %
Lymphocytes Relative: 24 %
Lymphs Abs: 2.6 10*3/uL (ref 0.7–4.0)
MCH: 31 pg (ref 26.0–34.0)
MCHC: 35.7 g/dL (ref 30.0–36.0)
MCV: 87 fL (ref 80.0–100.0)
Monocytes Absolute: 0.7 10*3/uL (ref 0.1–1.0)
Monocytes Relative: 7 %
Neutro Abs: 7.3 10*3/uL (ref 1.7–7.7)
Neutrophils Relative %: 68 %
Platelets: 119 10*3/uL — ABNORMAL LOW (ref 150–400)
RBC: 2.77 MIL/uL — ABNORMAL LOW (ref 3.87–5.11)
RDW: 14.6 % (ref 11.5–15.5)
WBC: 10.8 10*3/uL — ABNORMAL HIGH (ref 4.0–10.5)
nRBC: 0.4 % — ABNORMAL HIGH (ref 0.0–0.2)

## 2019-02-04 LAB — PROTIME-INR
INR: 2.1 — ABNORMAL HIGH (ref 0.8–1.2)
INR: 2.5 — ABNORMAL HIGH (ref 0.8–1.2)
INR: 1.6 — ABNORMAL HIGH (ref 0.8–1.2)
INR: 1.7 — ABNORMAL HIGH (ref 0.8–1.2)
Prothrombin Time: 23.6 seconds — ABNORMAL HIGH (ref 11.4–15.2)
Prothrombin Time: 26.8 seconds — ABNORMAL HIGH (ref 11.4–15.2)
Prothrombin Time: 18.8 s — ABNORMAL HIGH (ref 11.4–15.2)
Prothrombin Time: 19.4 s — ABNORMAL HIGH (ref 11.4–15.2)

## 2019-02-04 LAB — BPAM FFP
Blood Product Expiration Date: 202010242359
Blood Product Expiration Date: 202010242359
ISSUE DATE / TIME: 202010222136
ISSUE DATE / TIME: 202010222136
Unit Type and Rh: 5100
Unit Type and Rh: 5100

## 2019-02-04 LAB — BPAM PLATELET PHERESIS
Blood Product Expiration Date: 202010242359
Blood Product Expiration Date: 202010242359
ISSUE DATE / TIME: 202010221838
ISSUE DATE / TIME: 202010221838
Unit Type and Rh: 2800
Unit Type and Rh: 6200

## 2019-02-04 LAB — RENAL FUNCTION PANEL
Albumin: 2.7 g/dL — ABNORMAL LOW (ref 3.5–5.0)
Anion gap: 13 (ref 5–15)
BUN: 20 mg/dL (ref 8–23)
CO2: 26 mmol/L (ref 22–32)
Calcium: 8 mg/dL — ABNORMAL LOW (ref 8.9–10.3)
Chloride: 105 mmol/L (ref 98–111)
Creatinine, Ser: 2.57 mg/dL — ABNORMAL HIGH (ref 0.44–1.00)
GFR calc Af Amer: 21 mL/min — ABNORMAL LOW (ref >60)
GFR calc non Af Amer: 18 mL/min — ABNORMAL LOW (ref >60)
Glucose, Bld: 98 mg/dL (ref 70–99)
Phosphorus: 4.6 mg/dL (ref 2.5–4.6)
Potassium: 3.9 mmol/L (ref 3.5–5.1)
Sodium: 144 mmol/L (ref 135–145)

## 2019-02-04 LAB — COOXEMETRY PANEL
Carboxyhemoglobin: 1.6 % — ABNORMAL HIGH (ref 0.5–1.5)
Carboxyhemoglobin: 1.5 % (ref 0.5–1.5)
Methemoglobin: 1.7 % — ABNORMAL HIGH (ref 0.0–1.5)
Methemoglobin: 1.9 % — ABNORMAL HIGH (ref 0.0–1.5)
O2 Saturation: 85.1 %
O2 Saturation: 69.4 %
Total hemoglobin: 10.3 g/dL — ABNORMAL LOW (ref 12.0–16.0)
Total hemoglobin: 9.3 g/dL — ABNORMAL LOW (ref 12.0–16.0)

## 2019-02-04 LAB — MAGNESIUM
Magnesium: 1.4 mg/dL — ABNORMAL LOW (ref 1.7–2.4)
Magnesium: 1.7 mg/dL (ref 1.7–2.4)

## 2019-02-04 LAB — T3, FREE: T3, Free: 2.6 pg/mL (ref 2.0–4.4)

## 2019-02-04 LAB — APTT: aPTT: 40 seconds — ABNORMAL HIGH (ref 24–36)

## 2019-02-04 LAB — FIBRINOGEN: Fibrinogen: 316 mg/dL (ref 210–475)

## 2019-02-04 LAB — PREPARE RBC (CROSSMATCH)

## 2019-02-04 MED ORDER — SODIUM CHLORIDE 0.9 % FOR CRRT
INTRAVENOUS_CENTRAL | Status: DC | PRN
  Filled 2019-02-04: qty 1000

## 2019-02-04 MED ORDER — MAGNESIUM SULFATE 4 GM/100ML IV SOLN
4.0000 g | Freq: Once | INTRAVENOUS | Status: AC
  Administered 2019-02-04: 4 g via INTRAVENOUS
  Filled 2019-02-04: qty 100

## 2019-02-04 MED ORDER — VITAMIN K1 10 MG/ML IJ SOLN
5.0000 mg | Freq: Once | INTRAVENOUS | Status: AC
  Administered 2019-02-04: 5 mg via INTRAVENOUS
  Filled 2019-02-04: qty 0.5

## 2019-02-04 MED ORDER — EPINEPHRINE HCL 5 MG/250ML IV SOLN IN NS
0.5000 ug/min | INTRAVENOUS | Status: DC
  Administered 2019-02-04 – 2019-02-05 (×2): 5 ug/min via INTRAVENOUS
  Administered 2019-02-05: 7 ug/min via INTRAVENOUS
  Administered 2019-02-06 – 2019-02-09 (×8): 5 ug/min via INTRAVENOUS
  Filled 2019-02-04 (×13): qty 250

## 2019-02-04 MED ORDER — PRISMASOL BGK 4/2.5 32-4-2.5 MEQ/L IV SOLN
INTRAVENOUS | Status: DC
  Administered 2019-02-04 – 2019-02-05 (×7): via INTRAVENOUS_CENTRAL

## 2019-02-04 MED ORDER — LACTATED RINGERS IV SOLN
INTRAVENOUS | Status: DC
  Administered 2019-02-04: 08:00:00 via INTRAVENOUS

## 2019-02-04 MED ORDER — VASOPRESSIN 20 UNIT/ML IV SOLN
0.0300 [IU]/min | INTRAVENOUS | Status: DC
  Filled 2019-02-04: qty 2

## 2019-02-04 MED ORDER — SODIUM BICARBONATE 8.4 % IV SOLN
INTRAVENOUS | Status: AC
  Filled 2019-02-04: qty 50

## 2019-02-04 MED ORDER — FENTANYL CITRATE (PF) 100 MCG/2ML IJ SOLN: 25.0000 ug | Freq: Once | INTRAMUSCULAR | Status: DC

## 2019-02-04 MED ORDER — FENTANYL 2500MCG IN NS 250ML (10MCG/ML) PREMIX INFUSION
25.0000 ug/h | INTRAVENOUS | Status: DC
  Administered 2019-02-04: 25 ug/h via INTRAVENOUS
  Administered 2019-02-06 – 2019-02-08 (×2): 50 ug/h via INTRAVENOUS
  Filled 2019-02-04 (×3): qty 250

## 2019-02-04 MED ORDER — PRISMASOL BGK 4/2.5 32-4-2.5 MEQ/L REPLACEMENT SOLN
Status: DC
  Administered 2019-02-04 – 2019-02-09 (×5): via INTRAVENOUS_CENTRAL

## 2019-02-04 MED ORDER — SODIUM CHLORIDE 0.9 % IV SOLN
20.0000 ug | Freq: Once | INTRAVENOUS | Status: AC
  Administered 2019-02-04: 20 ug via INTRAVENOUS
  Filled 2019-02-04: qty 5

## 2019-02-04 MED ORDER — VECURONIUM BROMIDE 10 MG IV SOLR
INTRAVENOUS | Status: AC
  Administered 2019-02-04: 10 mg
  Filled 2019-02-04: qty 10

## 2019-02-04 MED ORDER — SODIUM CHLORIDE 0.9% IV SOLUTION
Freq: Once | INTRAVENOUS | Status: AC
  Administered 2019-02-04: 23:00:00 via INTRAVENOUS

## 2019-02-04 MED ORDER — SODIUM CHLORIDE 0.9% IV SOLUTION: Freq: Once | INTRAVENOUS | Status: DC

## 2019-02-04 MED ORDER — MIDAZOLAM 50MG/50ML (1MG/ML) PREMIX INFUSION
2.0000 mg/h | INTRAVENOUS | Status: DC
  Administered 2019-02-04: 2 mg/h via INTRAVENOUS
  Administered 2019-02-05 – 2019-02-06 (×4): 4 mg/h via INTRAVENOUS
  Administered 2019-02-07: 2 mg/h via INTRAVENOUS
  Administered 2019-02-07: 4 mg/h via INTRAVENOUS
  Administered 2019-02-08 (×2): 2 mg/h via INTRAVENOUS
  Filled 2019-02-04 (×9): qty 50

## 2019-02-04 MED ORDER — SODIUM CHLORIDE 0.9 % IV SOLN
0.0000 ug/kg/min | INTRAVENOUS | Status: DC
  Administered 2019-02-04: 1 ug/kg/min via INTRAVENOUS
  Administered 2019-02-05 – 2019-02-06 (×2): 2 ug/kg/min via INTRAVENOUS
  Filled 2019-02-04 (×4): qty 20

## 2019-02-04 MED ORDER — VECURONIUM BOLUS VIA INFUSION
0.0800 mg/kg | Freq: Once | INTRAVENOUS | Status: DC
  Filled 2019-02-04: qty 7

## 2019-02-04 MED ORDER — SODIUM CHLORIDE 0.9 % IV SOLN: 0.5000 ug/kg/min | INTRAVENOUS | Status: DC

## 2019-02-04 MED ORDER — VASOPRESSIN 20 UNIT/ML IV SOLN
0.0400 [IU]/min | INTRAVENOUS | Status: DC
  Administered 2019-02-05 – 2019-02-10 (×8): 0.04 [IU]/min via INTRAVENOUS
  Filled 2019-02-04 (×10): qty 2

## 2019-02-04 MED ORDER — POTASSIUM CHLORIDE 10 MEQ/50ML IV SOLN
10.0000 meq | INTRAVENOUS | Status: AC
  Administered 2019-02-04 (×4): 10 meq via INTRAVENOUS
  Filled 2019-02-04 (×5): qty 50

## 2019-02-04 MED ORDER — SODIUM CHLORIDE 0.9% FLUSH
10.0000 mL | Freq: Two times a day (BID) | INTRAVENOUS | Status: DC
  Administered 2019-02-05 – 2019-02-10 (×8): 10 mL

## 2019-02-04 MED ORDER — SODIUM CHLORIDE 0.9% FLUSH: 10.0000 mL | INTRAVENOUS | Status: DC | PRN

## 2019-02-04 MED ORDER — PRISMASOL BGK 4/2.5 32-4-2.5 MEQ/L REPLACEMENT SOLN
Status: DC
  Administered 2019-02-04 – 2019-02-10 (×4): via INTRAVENOUS_CENTRAL

## 2019-02-04 MED ORDER — FENTANYL BOLUS VIA INFUSION
25.0000 ug | INTRAVENOUS | Status: DC | PRN
  Administered 2019-02-10: 50 ug via INTRAVENOUS
  Filled 2019-02-04: qty 25

## 2019-02-04 MED ORDER — CHLORHEXIDINE GLUCONATE CLOTH 2 % EX PADS
6.0000 | MEDICATED_PAD | Freq: Every day | CUTANEOUS | Status: DC
  Administered 2019-02-04 – 2019-02-08 (×4): 6 via TOPICAL

## 2019-02-04 MED ORDER — ARTIFICIAL TEARS OPHTHALMIC OINT
1.0000 "application " | TOPICAL_OINTMENT | Freq: Three times a day (TID) | OPHTHALMIC | Status: DC
  Administered 2019-02-04 – 2019-02-08 (×12): 1 via OPHTHALMIC
  Filled 2019-02-04 (×2): qty 3.5

## 2019-02-04 MED ORDER — VECURONIUM BROMIDE 10 MG IV SOLR
6.5000 mg | Freq: Once | INTRAVENOUS | Status: AC
  Administered 2019-02-04: 6.5 mg via INTRAVENOUS

## 2019-02-04 MED ORDER — MIDAZOLAM BOLUS VIA INFUSION
1.0000 mg | INTRAVENOUS | Status: DC | PRN
  Administered 2019-02-10: 2 mg via INTRAVENOUS
  Filled 2019-02-04: qty 2

## 2019-02-04 MED ORDER — NICARDIPINE HCL IN NACL 20-0.86 MG/200ML-% IV SOLN
3.0000 mg/h | INTRAVENOUS | Status: DC
  Administered 2019-02-04: 5 mg/h via INTRAVENOUS
  Filled 2019-02-04: qty 200

## 2019-02-04 MED ORDER — PROTHROMBIN COMPLEX CONC HUMAN 500 UNITS IV KIT
2150.0000 [IU] | PACK | Status: AC
  Administered 2019-02-04: 2150 [IU] via INTRAVENOUS
  Filled 2019-02-04: qty 2150

## 2019-02-04 MED ORDER — NOREPINEPHRINE 4 MG/250ML-% IV SOLN
INTRAVENOUS | Status: AC
  Filled 2019-02-04: qty 250

## 2019-02-04 MED ORDER — HEPARIN SODIUM (PORCINE) 1000 UNIT/ML DIALYSIS
1000.0000 [IU] | INTRAMUSCULAR | Status: DC | PRN
  Administered 2019-02-08 – 2019-02-10 (×3): 3200 [IU] via INTRAVENOUS_CENTRAL
  Filled 2019-02-04 (×2): qty 6
  Filled 2019-02-04: qty 3
  Filled 2019-02-04 (×2): qty 6
  Filled 2019-02-04: qty 2
  Filled 2019-02-04 (×2): qty 6

## 2019-02-04 MED FILL — Magnesium Sulfate Inj 50%: INTRAMUSCULAR | Qty: 10 | Status: AC

## 2019-02-04 MED FILL — Heparin Sodium (Porcine) Inj 1000 Unit/ML: INTRAMUSCULAR | Qty: 20 | Status: AC

## 2019-02-04 MED FILL — Sodium Chloride IV Soln 0.9%: INTRAVENOUS | Qty: 2000 | Status: AC

## 2019-02-04 MED FILL — Heparin Sodium (Porcine) Inj 1000 Unit/ML: INTRAMUSCULAR | Qty: 30 | Status: AC

## 2019-02-04 MED FILL — Sodium Bicarbonate IV Soln 8.4%: INTRAVENOUS | Qty: 50 | Status: AC

## 2019-02-04 MED FILL — Potassium Chloride Inj 2 mEq/ML: INTRAVENOUS | Qty: 40 | Status: AC

## 2019-02-04 MED FILL — Calcium Chloride Inj 10%: INTRAVENOUS | Qty: 10 | Status: AC

## 2019-02-04 MED FILL — Mannitol IV Soln 20%: INTRAVENOUS | Qty: 500 | Status: AC

## 2019-02-04 MED FILL — Electrolyte-R (PH 7.4) Solution: INTRAVENOUS | Qty: 5000 | Status: AC

## 2019-02-04 NOTE — Progress Notes (Signed)
Initial Nutrition Assessment  DOCUMENTATION CODES:   Obesity unspecified  INTERVENTION:   Tube feeding recommendations: - Vital High Protein @ 50 ml/hr (1200 ml/day)   Recommended tube feeding regimen provides 1200 kcal, 105 grams of protein, and 1003 ml of H2O.   NUTRITION DIAGNOSIS:   Inadequate oral intake related to inability to eat as evidenced by NPO status.  GOAL:   Provide needs based on ASPEN/SCCM guidelines  MONITOR:   Vent status, Labs, Weight trends, Skin, I & O's  REASON FOR ASSESSMENT:   Ventilator    ASSESSMENT:   73 year old female who presented to the ED on 10/19 with chest pain and nausea. PMH of DM, CKD stage III-IV, HTN, TIA, CVA s/p right ICA stenting, anemia, GERD, HLD.   10/21 - s/p left heart cath 10/22 - s/p CABG x 3 10/23 - s/p chest exploration  Discussed pt with RN and during ICU rounds. Pt remains intubated post-op.  RD to leave TF recommendations.  Weight trending up over the last year.  Patient is currently intubated on ventilator support MV: 8.2 L/min Temp (24hrs), Avg:96 F (35.6 C), Min:92.7 F (33.7 C), Max:99.1 F (37.3 C) BP (a-line): 138/41 MAP (a-line): 72 BP (a-line 2): 170/39 MAP (a-line 2): 84  Drips: Precedex: 8 ml/hr Epinephrine: 18.7 ml/hr Insulin: 2.3 ml/hr Milrinone: 9.1 ml/hr Cardene: 50 ml/hr Nitroglycerin: 30 ml/hr LR: 75 ml/hr Albumin: 60 ml/hr  Medications reviewed and include: Dulcolax, Colace, insulin TID, Protonix, IV Pepcid, IV magnesium sulfate 4 grams once, IV KCl 10 mEq x 4 runs, IV abx  Labs reviewed: potassium 3.2, elevated LFTs, hemoglobin 9.8 CBG's: 91-176 x 12 hours  UOP: 690 ml x 24 hours VAC: 680 ml x 24 hours CT: 1340 ml x 24 hours I/O's: +6.7 L since admit  NUTRITION - FOCUSED PHYSICAL EXAM:  Deferred.  Diet Order:   Diet Order    None      EDUCATION NEEDS:   No education needs have been identified at this time  Skin:  Skin Assessment: Skin Integrity  Issues: Skin Integrity Issues: Wound Vac: chest Incisions: left leg  Last BM:  no documented BM  Height:   Ht Readings from Last 1 Encounters:  01/23/2019 5\' 2"  (1.575 m)    Weight:   Wt Readings from Last 1 Encounters:  01/24/2019 80.7 kg    Ideal Body Weight:  50 kg  BMI:  Body mass index is 32.54 kg/m.  Estimated Nutritional Needs:   Kcal:  [phone]  Protein:  100-120 grams  Fluid:  >/= 1.4 L    Gaynell Face, MS, RD, LDN Inpatient Clinical Dietitian Pager: [phone] Weekend/After Hours: [phone]

## 2019-02-04 NOTE — Progress Notes (Signed)
Pt received crypool and plasma emergently at bedside. Documented administration on bloodbank green sheet

## 2019-02-04 NOTE — Progress Notes (Signed)
Patient's chest tube output was 150 every hour x3, Cardiac outputs were 1.3, and systolic BP was dropping to the mid 80's. Albumin x4 was given in addition to 2 PRBC's and 2 FFP's. Repeat CBC showed a Hgb of 8.9. MD was paged because patient's chest tube output was a total of 800 in 4 hrs, drainage from the wound vac was a total of 500cc's, no urine output for 3 hrs, and patient's lower extremities were cyanotic and dusky. Unable to obtain doppler pulses. Verbal orders were to give 2 more PRBC's, 2 Platelets, and start DDAVP. Blood gas was obtained and were within normal limits. Patient's blood pressure dropped to 93'Y systolic and patient was bradycardic with HR in the 50's. Pacer was not capturing. Precedex was paused and LR bolus was given in addition with 2 Albumin's and calcium gluconate. MD was paged and at the bedside. 2 amps of bicarb was given and 1mg  of Epinephrine. 2 plasmas and 3 Crypools were given emergently. 8mg  of Vecuronoium was given by other RN per MD order. Chest was opened at bedside. Ioban dressing was applied by MD and was told to report any leaking around the dressing. Dressing is currently clean, dry, and intact. pt showed improvement in BP but Cardiac Output was still low. Verbal orders were to restart Milrinone at 0.135mcg, Vasopressin at 0.80mcg and start Epinephrine at 5mg . Patient is now hypertensive, verbal orders were to wean patient off of Vasopressin. Will continue to monitor for any changes.

## 2019-02-04 NOTE — Consult Note (Addendum)
Advanced Heart Failure Team Consult Note   Primary Physician: Terald Sleeper, PA-C PCP-Cardiologist:  Kate Sable, MD  Requesting: Dr. Orvan Seen  Reason for Consultation: Cardiogenic shock  HPI:    Paula Watson is seen today for evaluation of post cardiotomy cardiogenic shock at the request of Dr. Orvan Seen.   [name] a21 yo female history of CVA, carotid artery disease, DM2, COPD and HTN,  She was admitted with CP. Cardiac cath showed high-grade 99% distal LM lesion/. She developed CP post-cath and an IABP was inserted and she was taken for emergent CABG by Dr. Orvan Seen on 10/22.  Intra-op TEE showed EF 50-55% with normal RV. Her post-op course was complicated by persistent bleeding, cardiogenic shock and inability to close her chest.   She is now in the ICU paralyzed on the vent with her chest open. She in on CVVHD and iNO. Vent currently ay 70% FiO2. She is on epi at 5 and milrinone at 0.375. She has gotten an extensive amount of blood products.     Review of Systems: Unavailable as patient intubated and sedated  Home Medications Prior to Admission medications   Medication Sig Start Date End Date Taking? Authorizing Provider  albuterol (PROVENTIL HFA;VENTOLIN HFA) 108 (90 Base) MCG/ACT inhaler Inhale 2 puffs into the lungs every 6 (six) hours as needed for wheezing or shortness of breath. 06/12/17  Yes Terald Sleeper, PA-C  amLODipine (NORVASC) 10 MG tablet Take 1 tablet by mouth once daily Patient taking differently: Take 10 mg by mouth daily.  08/24/18  Yes Terald Sleeper, PA-C  aspirin EC 325 MG EC tablet Take 1 tablet (325 mg total) by mouth daily. 03/09/17  Yes Rinehuls, David L, PA-C  carvedilol (COREG) 12.5 MG tablet TAKE 1 TABLET BY MOUTH TWICE DAILY WITH MEALS Patient taking differently: Take 12.5 mg by mouth 2 (two) times daily with a meal.  09/08/18  Yes Terald Sleeper, PA-C  clopidogrel (PLAVIX) 75 MG tablet Take 1 tablet (75 mg total) by mouth daily. 09/08/18   Yes Terald Sleeper, PA-C  clotrimazole-betamethasone (LOTRISONE) lotion Apply topically 2 (two) times daily. 07/28/18  Yes Terald Sleeper, PA-C  doxazosin (CARDURA) 2 MG tablet Take 2 mg by mouth every evening.  01/06/18  Yes [provider]  furosemide (LASIX) 20 MG tablet Take 1-2 tablets (20-40 mg total) by mouth daily. Patient taking differently: Take 40 mg by mouth daily.  01/07/18  Yes Terald Sleeper, PA-C  linaclotide (LINZESS) 145 MCG CAPS capsule Take 1 capsule (145 mcg total) by mouth daily before breakfast. Patient taking differently: Take 145 mcg by mouth daily as needed (for constipation.).  06/12/17  Yes Terald Sleeper, PA-C  losartan (COZAAR) 50 MG tablet Take 50 mg by mouth daily. 01/24/19  Yes [provider]  mupirocin cream (BACTROBAN) 2 % Apply 1 application topically 2 (two) times daily. Patient taking differently: Apply 1 application topically 2 (two) times daily as needed (irritation.).  06/09/16  Yes Terald Sleeper, PA-C  pantoprazole (PROTONIX) 20 MG tablet Take 1 tablet (20 mg total) by mouth daily. (Needs to be seen before next refill) Patient taking differently: Take 20 mg by mouth daily.  07/28/18  Yes Terald Sleeper, PA-C  pravastatin (PRAVACHOL) 20 MG tablet Take 1 tablet (20 mg total) by mouth daily. 07/26/18  Yes Particia Nearing S, PA-C  RAYALDEE 30 MCG CPCR Take 30 mcg by mouth at bedtime.   Yes [provider]  blood  glucose meter kit and supplies Dispense based on patient and insurance preference.Check Blood sugar three times a day. DX E11.65 01/20/2019   Terald Sleeper, PA-C  sitaGLIPtin (JANUVIA) 50 MG tablet Take 1 tablet (50 mg total) by mouth daily. 01/14/2019   Terald Sleeper, PA-C    Past Medical History: Past Medical History:  Diagnosis Date  . Anemia    Iron deficiency  . 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 Blood transfusion   . Bronchitis    "have had a couple of times in my lifetime"  . Cancer ([place]) 1985   cervical  . Cataract   . CKD  (chronic kidney disease) stage 1, GFR 90 ml/min or greater was told by her PCP   Intolerant to ACE according to her PCP  . Diabetes mellitus without complication ([place])   . Dyspnea   . Dysrhythmia    :"SKIPS A BEAT "   SCHEDULED FOR STRESS TEST 2019  . GERD (gastroesophageal reflux disease)   . High cholesterol   . History of hiatal hernia   . History of kidney stones   . Hyperlipidemia   . Hypertension   . Poliomyelitis since 1948   has drop foot on R  . Stroke [facility]) 2007 affected R side  . Vertigo now improved    Past Surgical History: Past Surgical History:  Procedure Laterality Date  . CAROTID ENDARTERECTOMY Left 2000 L side  . CERVICAL CONIZATION W/BX  1985  . COLONOSCOPY  2010   Dr. Laural Golden: tubular adenoma.   . COLONOSCOPY N/A 03/03/2018   Procedure: COLONOSCOPY;  Surgeon: Daneil Dolin, MD;  [facility];  Service: Endoscopy;  Laterality: N/A;  2:00pm  . CORONARY ANGIOPLASTY  stented 2007  . CORONARY ARTERY BYPASS GRAFT N/A 01/20/2019   Procedure: CORONARY ARTERY BYPASS GRAFTING (CABG) times three. Free IMA to LAD. Endoscopic harvesting of left greater saphenous vein to OM1 and Posterior Lateral (PLB).;  Surgeon: Wonda Olds, MD;  Location: [place];  Service: Open Heart Surgery;  Laterality: N/A;  . DILATION AND CURETTAGE OF UTERUS  1980's  . foot reconstructjion  1953   right foot; post polio  . IABP INSERTION Right 01/16/2019   Procedure: IABP Insertion;  Surgeon: Wellington Hampshire, MD;  Location: [place] [facility];  Service: Cardiovascular;  Laterality: Right;  . IR ANGIO EXTERNAL CAROTID SEL EXT CAROTID UNI R MOD SED  03/06/2017  . IR ANGIO INTRA EXTRACRAN SEL COM CAROTID INNOMINATE UNI R MOD SED  03/24/2017  . IR ANGIO VERTEBRAL SEL SUBCLAVIAN INNOMINATE BILAT MOD SED  03/09/2017  . IR ANGIO VERTEBRAL SEL VERTEBRAL BILAT MOD SED  03/06/2017  . IR RADIOLOGIST EVAL & MGMT  07/21/2016  . LEFT HEART CATH AND CORONARY ANGIOGRAPHY N/A 01/27/2019    Procedure: LEFT HEART CATH AND CORONARY ANGIOGRAPHY;  Surgeon: Wellington Hampshire, MD;  Location: [place] [facility];  Service: Cardiovascular;  Laterality: N/A;  RIGHT AND LEFT  . POLYPECTOMY  03/03/2018   Procedure: POLYPECTOMY;  Surgeon: Daneil Dolin, MD;  [facility];  Service: Endoscopy;;  colon  . RADIOLOGY WITH ANESTHESIA N/A 03/24/2017   Procedure: STENTING;  Surgeon: Luanne Bras, MD;  Location: [place];  Service: Radiology;  Laterality: N/A;  . TEE WITHOUT CARDIOVERSION N/A 01/16/2019   Procedure: TRANSESOPHAGEAL ECHOCARDIOGRAM (TEE);  Surgeon: Wonda Olds, MD;  Location: [place];  Service: Open Heart Surgery;  Laterality: N/A;  . TONSILLECTOMY  1954    Family  History: Family History  Problem Relation Age of Onset  . Coronary artery disease Mother   . Heart disease Mother   . Stroke Mother   . Diabetes type II Father   . Cancer Father        brain stem  . Brain cancer Father   . Diabetes Father   . Cancer Sister        breast with recurrance  . Diabetes Sister   . Stroke Sister   . Breast cancer Sister   . Coronary artery disease Brother   . Healthy Daughter   . COPD Maternal Grandmother        breast  . Breast cancer Maternal Grandmother   . COPD Paternal Grandmother        breast  . Breast cancer Paternal Grandmother   . Obesity Sister   . Heart disease Brother   . Heart attack Brother   . Lung cancer Brother   . COPD Other   . Breast cancer Other   . Stroke Paternal Grandfather   . Drug abuse Son   . Alcohol abuse Son   . Colon cancer Other        passed from colon cancer    Social History: Social History   Socioeconomic History  . Marital status: Divorced    Spouse name: Not on file  . Number of children: 2  . Years of education: 57  . Highest education level: 12th grade  Occupational History  . 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 Financial resource strain:  Somewhat hard  . Food insecurity    Worry: Never true    Inability: Never true  . Transportation needs    Medical: No    Non-medical: No  Tobacco Use  . Smoking status: Former Smoker    Packs/day: 0.25    Years: 50.00    Pack years: 12.50    Types: Cigarettes    Quit date: 03/03/2017    Years since quitting: 1.9  . Smokeless tobacco: Never Used  Substance and Sexual Activity  . Alcohol use: No  . Drug use: No  . Sexual activity: Not Currently    Birth control/protection: Post-menopausal  Lifestyle  . Physical activity    Days per week: 0 days    Minutes per session: 0 min  . Stress: Rather much  Relationships  . Social connections    Talks on phone: More than three times a week    Gets together: More than three times a week    Attends religious service: Never    Active member of club or organization: No    Attends meetings of clubs or organizations: Never    Relationship status: Divorced  Other Topics Concern  . Not on file  Social History Narrative  . Not on file    Allergies:  Allergies  Allergen Reactions  . Penicillins Hives and Swelling    PATIENT HAS HAD A PCN REACTION WITH IMMEDIATE RASH, FACIAL/TONGUE/THROAT SWELLING, SOB, OR LIGHTHEADEDNESS WITH HYPOTENSION:  #  #  #  YES  #  #  #   Has patient had a PCN reaction causing severe rash involving mucus membranes or skin necrosis: Unknown Has patient had a PCN reaction that required hospitalization: No Has patient had a PCN reaction occurring within the last 10 years: No     Objective:    Vital Signs:   Temp:  [92.7 F (33.7 C)-99.1  F (37.3 C)] 95 F (35 C) (10/23 2045) Pulse Rate:  [25-90] 85 (10/23 2045) Resp:  [12-28] 18 (10/23 2045) BP: (61-163)/(25-114) 86/55 (10/23 2030) SpO2:  [53 %-100 %] 100 % (10/23 2045) Arterial Line BP: (63-179)/(34-95) 98/46 (10/23 2045) FiO2 (%):  [50 %-100 %] 70 % (10/23 2011) Weight:  [102.2 kg] 102.2 kg (10/23 1500) Last BM Date: (PTA)  Weight change: Filed  Weights   01/16/2019 0500 01/17/2019 0500 02/04/19 1500  Weight: 86.4 kg 80.7 kg 102.2 kg    Intake/Output:   Intake/Output Summary (Last 24 hours) at 02/04/2019 2058 Last data filed at 02/04/2019 2000 Gross per 24 hour  Intake 6679.46 ml  Output 3298 ml  Net 3381.46 ml      Physical Exam    General:  Intubated sedated on vent HEENT: +ETT Neck: supple. JVP elevated . Carotids 2+ bilat; no bruits. No lymphadenopathy or thyromegaly appreciated. Cor: Chest open with spacer in place. +CTs  Lungs: coarse Abdomen: obese soft, nontender, + distended. No hepatosplenomegaly. No bruits or masses. Hypoactive bowel sounds. Extremities: no cyanosis, clubbing, rash, + femoral dialysis cath Neuro: sedated and paralyzec   Telemetry   Sinus 80s Personally reviewed   Labs   Basic Metabolic Panel: Recent Labs  Lab 02/09/2019 0235  02/09/2019 0052  01/13/2019 1848 01/24/2019 1937  02/04/19 9735  02/04/19 0750 02/04/19 0756 02/04/19 0922 02/04/19 1208 02/04/19 1328 02/04/19 1645 02/04/19 1825  NA 135   < > 135   < > 137 139  138   < > 139   < > 145 144 145 144 143 144  --   K 4.1   < > 3.6   < > 4.0 3.0*  3.0*   < > 4.4   < > 3.2* 3.2* 2.9* 2.9* 3.3* 3.9  --   CL 107  --  105   < > 96* 98  --  106  --  106  --   --   --   --  105  --   CO2 18*  --  21*  --   --   --   --  22  --  25  --   --   --   --  26  --   GLUCOSE 210*  --  244*   < > 143* 111*  --  171*  --  94  --   --   --   --  98  --   BUN 33*  --  31*   < > 25* 24*  --  20  --  19  --   --   --   --  20  --   CREATININE 3.20*  --  2.98*   < > 2.20* 2.10*  --  2.30*  --  2.40*  --   --   --   --  2.57*  --   CALCIUM 8.6*  --  8.1*  --   --   --   --  7.0*  --  8.3*  --   --   --   --  8.0*  --   MG  --   --   --   --   --   --   --  1.4*  --   --   --   --   --   --   --  1.7  PHOS 4.3  --  3.5  --   --   --   --   --   --   --   --   --   --   --  4.6  --    < > = values in this interval not displayed.    Liver Function  Tests: Recent Labs  Lab 01/22/2019 1028 02/01/2019 1402 01/20/2019 0235 02/09/2019 0052 02/04/19 0750 02/04/19 1645  AST 11 15  --   --  1,763*  --   ALT 10 13  --   --  1,425*  --   ALKPHOS 109 82  --   --  32*  --   BILITOT <0.2 0.5  --   --  2.6*  --   PROT 6.0 6.5  --   --  4.2*  --   ALBUMIN 3.7 3.3* 2.8* 2.6* 2.8* 2.7*   No results for input(s): LIPASE, AMYLASE in the last 168 hours. No results for input(s): AMMONIA in the last 168 hours.  CBC: Recent Labs  Lab 01/26/2019 1028 02/12/2019 1402  02/09/2019 0052  01/13/2019 06-23-33 01/20/2019 06/23/56  02/04/19 0204  02/04/19 0616 02/04/19 0756 02/04/19 0922 02/04/19 1208 02/04/19 1328 02/04/19 1640  WBC 10.2 10.8*   < > 10.1  --   --  12.2*  --  12.9*  --  7.3  --   --   --   --  10.8*  NEUTROABS 7.0 8.0*  --   --   --   --   --   --   --   --  5.6  --   --   --   --  7.3  HGB 11.0* 11.2*   < > 8.7*   < >  --  9.6*   < > 8.9*   < > 9.8* 8.8* 8.5* 7.8* 7.8* 8.6*  HCT 32.9* 35.9*   < > 26.1*   < >  --  28.6*   < > 26.2*   < > 28.9* 26.0* 25.0* 23.0* 23.0* 24.1*  MCV 86 88.4   < > 86.4  --   --  87.7  --  88.2  --  91.5  --   --   --   --  87.0  PLT 287 263   < > 224   < > 105* 111*  --  92*  --  99*  --   --   --   --  119*   < > = values in this interval not displayed.    Cardiac Enzymes: No results for input(s): CKTOTAL, CKMB, CKMBINDEX, TROPONINI in the last 168 hours.  BNP: BNP (last 3 results) No results for input(s): BNP in the last 8760 hours.  ProBNP (last 3 results) No results for input(s): PROBNP in the last 8760 hours.   CBG: Recent Labs  Lab 02/04/19 1308 02/04/19 1412 02/04/19 1517 02/04/19 1612 02/04/19 1836  GLUCAP 101* 102* 97 93 121*    Coagulation Studies: Recent Labs    02/06/2019 Jun 23, 2033 02/06/2019 2056/06/23 02/04/19 0300 02/04/19 0616 02/04/19 1640  LABPROT 20.7* 20.4* 23.6* 26.8* 18.8*  INR 1.8* 1.8* 2.1* 2.5* 1.6*     Imaging   Dg Chest Port 1 View  Result Date: 02/07/2019 CLINICAL DATA:   Provided history of status post CABG x3. technologist notes state second image was after Dr. advanced a line. EXAM: PORTABLE CHEST 1 VIEW COMPARISON:  None. FINDINGS: 2108-06-23 hour: Endotracheal tube tip at the thoracic inlet. Enteric tube tip below the diaphragm, side-port in the region of the distal esophagus. Right internal jugular Swan-Ganz catheter tip in the region of the main pulmonary outflow tract. Mediastinal drain and bilateral chest tubes in place.  Intra-aortic balloon pump tip at the level of T8. No visualized pneumothorax. Low lung volumes. Mild cardiomegaly likely accentuated by technique. No pulmonary edema or large pleural effusion. Mild retrocardiac atelectasis. No sternal wires are visualized. 2111 hr: The intra-aortic balloon pump tip is now at the level of T7. No other interval change. IMPRESSION: 1. Support apparatus and drainage catheters as described. 2. Low lung volumes.  Mild retrocardiac atelectasis. Electronically Signed   By: Keith Rake M.D.   On: 02/07/2019 21:23      Medications:     Current Medications: . sodium chloride   Intravenous Once  . sodium chloride   Intravenous Once  . sodium chloride   Intravenous Once  . sodium chloride   Intravenous Once  . sodium chloride   Intravenous Once  . sodium chloride   Intravenous Once  . acetaminophen  1,000 mg Oral Q6H   Or  . acetaminophen (TYLENOL) oral liquid 160 mg/5 mL  1,000 mg Per Tube Q6H  . artificial tears  1 application Both Eyes H4R  . aspirin EC  325 mg Oral Daily   Or  . aspirin  324 mg Per Tube Daily  . atorvastatin  40 mg Oral q1800  . bisacodyl  10 mg Oral Daily   Or  . bisacodyl  10 mg Rectal Daily  . chlorhexidine  15 mL Mouth/Throat NOW  . Chlorhexidine Gluconate Cloth  6 each Topical Daily  . darbepoetin (ARANESP) injection - NON-DIALYSIS  60 mcg Subcutaneous Q Wed-1800  . docusate sodium  200 mg Oral Daily  . fentaNYL (SUBLIMAZE) injection  25 mcg Intravenous Once  . insulin regular  0-10  Units Intravenous TID WC  . metoprolol tartrate  12.5 mg Oral BID   Or  . metoprolol tartrate  12.5 mg Per Tube BID  . [START ON 02/05/2019] pantoprazole  40 mg Oral Daily  . sodium chloride flush  3 mL Intravenous Q12H     Infusions: .  prismasol BGK 4/2.5 200 mL/hr at 02/04/19 1716  .  prismasol BGK 4/2.5 200 mL/hr at 02/04/19 1715  . sodium chloride    . sodium chloride    . sodium chloride    . cisatracurium (NIMBEX) infusion 1 mcg/kg/min (02/04/19 2006)  . epinephrine 5 mcg/min (02/04/19 2000)  . fentaNYL infusion INTRAVENOUS    . insulin 1.6 mL/hr at 02/04/19 2000  . lactated ringers    . lactated ringers    . lactated ringers    . lactated ringers 20 mL/hr at 02/04/19 2000  . midazolam 2 mg/hr (02/04/19 2005)  . milrinone 0.375 mcg/kg/min (02/04/19 2000)  . niCARDipine Stopped (02/04/19 1706)  . nitroGLYCERIN Stopped (02/04/19 1239)  . phenylephrine (NEO-SYNEPHRINE) Adult infusion    . prismasol BGK 4/2.5 2,000 mL/hr at 02/04/19 2008  . vasopressin (PITRESSIN) infusion - *FOR SHOCK*         Assessment/Plan   1. Post-cardiotomy cardiogenic shock  - now beginning to stabilize - co-ox 69% on epi 5 and milrinone 0.375. however output on swan remains low at 3.2/1.7 - suspect RV failure playing significant role. continue inotrope support  - fluid removal with CVVHD - hopefully chest can be closed early next week - intra-op TEE 50-55%. Can consider repeat TEE in the next day or two as needed  2. Acute post-op respiratory failure - on FiO2 80% currently.  - continue fluid removal and diuresis - watch for ARDS with amount of blood products transfused - wean vent as tolerated  3.  CAD with NSTEMI - cath with 99% distal LM -  s/p CABG 10/22 - continue ASA. Eventual statin - no b-blocker with shock  4. AKI - due to shock/ATN - continue CVVHD  5. Shock liver - continue supportive care  6. Coagulopathy and ABL anemia - in setting of DAPT - replace products as  needed - bleeding seems to have slowed.    CRITICAL CARE Performed by: Glori Bickers  Total critical care time: 45 minutes  Critical care time was exclusive of separately billable procedures and treating other patients.  Critical care was necessary to treat or prevent imminent or life-threatening deterioration.  Critical care was time spent personally by me (independent of midlevel providers or residents) on the following activities: development of treatment plan with patient and/or surrogate as well as nursing, discussions with consultants, evaluation of patient's response to treatment, examination of patient, obtaining history from patient or surrogate, ordering and performing treatments and interventions, ordering and review of laboratory studies, ordering and review of radiographic studies, pulse oximetry and re-evaluation of patient's condition.    Length of Stay: [place], MD  02/04/2019, 8:58 PM  Advanced Heart Failure Team Pager [phone] (M-F; 7a - 4p)  Please contact [facility] for night-coverage after hours (4p -7a ) and weekends on [email or web address]

## 2019-02-04 NOTE — Progress Notes (Signed)
[facility]    NEPHROLOGY PROGRESS NOTE  SUBJECTIVE: Patient seen and examined.  Patient sedated on ventilator.  Events noted.  Is approximately 20 kg over admission weight.  Unable to provide review of systems.  OBJECTIVE:  Vitals:   02/04/19 1245 02/04/19 1300  BP: (!) 137/96 93/74  Pulse: (!) 43 (!) 42  Resp: 18 16  Temp: 98.2 F (36.8 C) 98.2 F (36.8 C)  SpO2: 100% 100%    Intake/Output Summary (Last 24 hours) at 02/04/2019 1357 Last data filed at 02/04/2019 1300 Gross per 24 hour  Intake 9040.95 ml  Output 4100 ml  Net 4940.95 ml      General: Sedated NAD HEENT: MMM Orr AT anicteric sclera Neck:  No JVD, no adenopathy CV:  Heart RRR, open chest Lungs:  L/S decreased bilaterally Abd: Distended with normal BS GU:  Bladder non-palpable Extremities: 3+ bilateral lower extremity edema. Skin:  No skin rash  MEDICATIONS:  . sodium chloride   Intravenous Once  . sodium chloride   Intravenous Once  . sodium chloride   Intravenous Once  . sodium chloride   Intravenous Once  . sodium chloride   Intravenous Once  . acetaminophen  1,000 mg Oral Q6H   Or  . acetaminophen (TYLENOL) oral liquid 160 mg/5 mL  1,000 mg Per Tube Q6H  . aspirin EC  325 mg Oral Daily   Or  . aspirin  324 mg Per Tube Daily  . atorvastatin  40 mg Oral q1800  . bisacodyl  10 mg Oral Daily   Or  . bisacodyl  10 mg Rectal Daily  . Calcifediol ER  30 mcg Oral QHS  . chlorhexidine  15 mL Mouth/Throat NOW  . Chlorhexidine Gluconate Cloth  6 each Topical Daily  . darbepoetin (ARANESP) injection - NON-DIALYSIS  60 mcg Subcutaneous Q Wed-1800  . docusate sodium  200 mg Oral Daily  . insulin regular  0-10 Units Intravenous TID WC  . metoprolol tartrate  12.5 mg Oral BID   Or  . metoprolol tartrate  12.5 mg Per Tube BID  . [START ON 02/05/2019] pantoprazole  40 mg Oral Daily  . sodium bicarbonate      . sodium chloride flush  3 mL Intravenous Q12H  . vecuronium  0.08 mg/kg  Intravenous Once       LABS:   CBC Latest Ref Rng & Units 02/04/2019 02/04/2019 02/04/2019  WBC 4.0 - 10.5 K/uL - - -  Hemoglobin 12.0 - 15.0 g/dL 7.8(L) 7.8(L) 8.5(L)  Hematocrit 36.0 - 46.0 % 23.0(L) 23.0(L) 25.0(L)  Platelets 150 - 400 K/uL - - -    CMP Latest Ref Rng & Units 02/04/2019 02/04/2019 02/04/2019  Glucose 70 - 99 mg/dL - - -  BUN 8 - 23 mg/dL - - -  Creatinine 0.44 - 1.00 mg/dL - - -  Sodium 135 - 145 mmol/L 143 144 145  Potassium 3.5 - 5.1 mmol/L 3.3(L) 2.9(L) 2.9(L)  Chloride 98 - 111 mmol/L - - -  CO2 22 - 32 mmol/L - - -  Calcium 8.9 - 10.3 mg/dL - - -  Total Protein 6.5 - 8.1 g/dL - - -  Total Bilirubin 0.3 - 1.2 mg/dL - - -  Alkaline Phos 38 - 126 U/L - - -  AST 15 - 41 U/L - - -  ALT 0 - 44 U/L - - -    Lab Results  Component Value Date   CALCIUM 8.3 (L) 02/04/2019   CAION  1.05 (L) 02/04/2019   PHOS 3.5 02/08/2019       Component Value Date/Time   COLORURINE YELLOW 02/01/2019 1518   APPEARANCEUR HAZY (A) 02/01/2019 1518   LABSPEC 1.016 02/01/2019 1518   PHURINE 5.0 02/01/2019 1518   GLUCOSEU 150 (A) 02/01/2019 1518   HGBUR NEGATIVE 02/01/2019 1518   BILIRUBINUR NEGATIVE 02/01/2019 1518   KETONESUR NEGATIVE 02/01/2019 1518   PROTEINUR 100 (A) 02/01/2019 1518   UROBILINOGEN 0.2 07/22/2013 1633   NITRITE NEGATIVE 02/01/2019 1518   LEUKOCYTESUR NEGATIVE 02/01/2019 1518      Component Value Date/Time   PHART 7.586 (H) 02/04/2019 1328   PCO2ART 34.5 02/04/2019 1328   PO2ART 141.0 (H) 02/04/2019 1328   HCO3 32.9 (H) 02/04/2019 1328   TCO2 34 (H) 02/04/2019 1328   ACIDBASEDEF 7.6 (H) 02/04/2019 0416   O2SAT 100.0 02/04/2019 1328       Component Value Date/Time   IRON 46 02/11/2019 0235   TIBC 237 (L) 02/09/2019 0235   FERRITIN 54 01/16/2019 0235   IRONPCTSAT 19 01/15/2019 0235       ASSESSMENT/PLAN:    73 year old white female with longstanding diabetes mellitus with complications, likely diabetic nephropathy, diffuse vascular  disease now presenting with unstable angina/acute coronary syndrome status post CABG on 02/11/2019.  1.  Chronic kidney disease stage IV.  Her baseline serum creatinine is in the low twos.  Likely secondary to diabetic nephropathy.  2.  Acute kidney injury.  Poor urine output this morning likely secondary to hypotension.  Presumed ATN.  Marked fluid overload.  Will initiate CRRT at this time for volume management.  Discussed with family who is agreeable, and Dr. Orvan Seen.  3.  Acute hypoxemic respiratory failure.  Likely secondary to volume overload in the setting of resuscitation.  4.  Cardiac/pericardial tamponade post CABG.  Improved with sternal opening.    5.  Metabolic alkalosis.  Likely secondary to blood product resuscitation and hypokalemia.  Should improve with CRRT.  6.  Hypokalemia.  Should improve with CRRT.    Adrian, DO, [place]

## 2019-02-04 NOTE — Procedures (Signed)
CT surgery procedure:  Called to see patient for bradycardia and hypotension. Patient was acidotic and hypotensive with elevated CVP. Decision made to take down existing sternal dressing which was done sterilely. No significant clot encountered, but hemodynamics dramatically improved with chest opening. Spacer placed between the sternal halves to provide more room. Covered with sterile dressings; chest tubes to suction and working currently. Justyce Baby Z. Orvan Seen, [place]

## 2019-02-04 NOTE — Progress Notes (Signed)
IABP pulled by Dr.Atkins at 1639 pressure applied to site for 20 min.  Vital signs stable. Will continue to monitor site.Family updated

## 2019-02-05 ENCOUNTER — Inpatient Hospital Stay (HOSPITAL_COMMUNITY): Payer: Medicare Other

## 2019-02-05 LAB — RENAL FUNCTION PANEL
Albumin: 2.8 g/dL — ABNORMAL LOW (ref 3.5–5.0)
Albumin: 2.8 g/dL — ABNORMAL LOW (ref 3.5–5.0)
Anion gap: 11 (ref 5–15)
Anion gap: 9 (ref 5–15)
BUN: 13 mg/dL (ref 8–23)
BUN: 12 mg/dL (ref 8–23)
CO2: 24 mmol/L (ref 22–32)
CO2: 22 mmol/L (ref 22–32)
Calcium: 7.8 mg/dL — ABNORMAL LOW (ref 8.9–10.3)
Calcium: 7.4 mg/dL — ABNORMAL LOW (ref 8.9–10.3)
Chloride: 106 mmol/L (ref 98–111)
Chloride: 107 mmol/L (ref 98–111)
Creatinine, Ser: 1.68 mg/dL — ABNORMAL HIGH (ref 0.44–1.00)
Creatinine, Ser: 1.44 mg/dL — ABNORMAL HIGH (ref 0.44–1.00)
GFR calc Af Amer: 35 mL/min — ABNORMAL LOW (ref >60)
GFR calc Af Amer: 42 mL/min — ABNORMAL LOW (ref >60)
GFR calc non Af Amer: 30 mL/min — ABNORMAL LOW (ref >60)
GFR calc non Af Amer: 36 mL/min — ABNORMAL LOW (ref >60)
Glucose, Bld: 100 mg/dL — ABNORMAL HIGH (ref 70–99)
Glucose, Bld: 162 mg/dL — ABNORMAL HIGH (ref 70–99)
Phosphorus: 2.9 mg/dL (ref 2.5–4.6)
Phosphorus: 3.6 mg/dL (ref 2.5–4.6)
Potassium: 4 mmol/L (ref 3.5–5.1)
Potassium: 5.2 mmol/L — ABNORMAL HIGH (ref 3.5–5.1)
Sodium: 141 mmol/L (ref 135–145)
Sodium: 138 mmol/L (ref 135–145)

## 2019-02-05 LAB — PREPARE CRYOPRECIPITATE
Unit division: 0
Unit division: 0

## 2019-02-05 LAB — CBC
HCT: 30.9 % — ABNORMAL LOW (ref 36.0–46.0)
Hemoglobin: 10.8 g/dL — ABNORMAL LOW (ref 12.0–15.0)
MCH: 30.9 pg (ref 26.0–34.0)
MCHC: 35 g/dL (ref 30.0–36.0)
MCV: 88.3 fL (ref 80.0–100.0)
Platelets: 125 10*3/uL — ABNORMAL LOW (ref 150–400)
RBC: 3.5 MIL/uL — ABNORMAL LOW (ref 3.87–5.11)
RDW: 14.7 % (ref 11.5–15.5)
WBC: 9.1 10*3/uL (ref 4.0–10.5)
nRBC: 0.5 % — ABNORMAL HIGH (ref 0.0–0.2)

## 2019-02-05 LAB — BPAM FFP
Blood Product Expiration Date: 202010272359
Blood Product Expiration Date: 202010272359
Blood Product Expiration Date: 202010282359
Blood Product Expiration Date: 202010282359
ISSUE DATE / TIME: 202010230516
ISSUE DATE / TIME: 202010230516
ISSUE DATE / TIME: 202010230549
ISSUE DATE / TIME: 202010230549
Unit Type and Rh: 5100
Unit Type and Rh: 600
Unit Type and Rh: 6200
Unit Type and Rh: 9500

## 2019-02-05 LAB — POCT I-STAT EG7
Bicarbonate: 25.4 mmol/L (ref 20.0–28.0)
Calcium, Ion: 1.08 mmol/L — ABNORMAL LOW (ref 1.15–1.40)
HCT: 30 % — ABNORMAL LOW (ref 36.0–46.0)
Hemoglobin: 10.2 g/dL — ABNORMAL LOW (ref 12.0–15.0)
O2 Saturation: 73 %
Patient temperature: 35.2
Potassium: 4.6 mmol/L (ref 3.5–5.1)
Sodium: 139 mmol/L (ref 135–145)
TCO2: 27 mmol/L (ref 22–32)
pCO2, Ven: 40.4 mmHg — ABNORMAL LOW (ref 44.0–60.0)
pH, Ven: 7.398 (ref 7.250–7.430)
pO2, Ven: 35 mmHg (ref 32.0–45.0)

## 2019-02-05 LAB — COOXEMETRY PANEL
Carboxyhemoglobin: 1.1 % (ref 0.5–1.5)
Carboxyhemoglobin: 1.3 % (ref 0.5–1.5)
Methemoglobin: 1.7 % — ABNORMAL HIGH (ref 0.0–1.5)
Methemoglobin: 1.7 % — ABNORMAL HIGH (ref 0.0–1.5)
O2 Saturation: 58.3 %
O2 Saturation: 95.6 %
Total hemoglobin: 12 g/dL (ref 12.0–16.0)
Total hemoglobin: 9 g/dL — ABNORMAL LOW (ref 12.0–16.0)

## 2019-02-05 LAB — PREPARE FRESH FROZEN PLASMA
Unit division: 0
Unit division: 0
Unit division: 0

## 2019-02-05 LAB — POCT I-STAT 7, (LYTES, BLD GAS, ICA,H+H)
Acid-Base Excess: 2 mmol/L (ref 0.0–2.0)
Bicarbonate: 25.9 mmol/L (ref 20.0–28.0)
Calcium, Ion: 1.12 mmol/L — ABNORMAL LOW (ref 1.15–1.40)
HCT: 29 % — ABNORMAL LOW (ref 36.0–46.0)
Hemoglobin: 9.9 g/dL — ABNORMAL LOW (ref 12.0–15.0)
O2 Saturation: 95 %
Patient temperature: 35.9
Potassium: 3.9 mmol/L (ref 3.5–5.1)
Sodium: 140 mmol/L (ref 135–145)
TCO2: 27 mmol/L (ref 22–32)
pCO2 arterial: 34.4 mmHg (ref 32.0–48.0)
pH, Arterial: 7.48 — ABNORMAL HIGH (ref 7.350–7.450)
pO2, Arterial: 65 mmHg — ABNORMAL LOW (ref 83.0–108.0)

## 2019-02-05 LAB — GLUCOSE, CAPILLARY
Glucose-Capillary: 106 mg/dL — ABNORMAL HIGH (ref 70–99)
Glucose-Capillary: 105 mg/dL — ABNORMAL HIGH (ref 70–99)
Glucose-Capillary: 107 mg/dL — ABNORMAL HIGH (ref 70–99)
Glucose-Capillary: 106 mg/dL — ABNORMAL HIGH (ref 70–99)
Glucose-Capillary: 112 mg/dL — ABNORMAL HIGH (ref 70–99)
Glucose-Capillary: 98 mg/dL (ref 70–99)
Glucose-Capillary: 100 mg/dL — ABNORMAL HIGH (ref 70–99)
Glucose-Capillary: 151 mg/dL — ABNORMAL HIGH (ref 70–99)
Glucose-Capillary: 100 mg/dL — ABNORMAL HIGH (ref 70–99)

## 2019-02-05 LAB — CBC WITH DIFFERENTIAL/PLATELET
Abs Immature Granulocytes: 0.34 10*3/uL — ABNORMAL HIGH (ref 0.00–0.07)
Basophils Absolute: 0 10*3/uL (ref 0.0–0.1)
Basophils Relative: 1 %
Eosinophils Absolute: 0 10*3/uL (ref 0.0–0.5)
Eosinophils Relative: 0 %
HCT: 28.9 % — ABNORMAL LOW (ref 36.0–46.0)
Hemoglobin: 9.8 g/dL — ABNORMAL LOW (ref 12.0–15.0)
Immature Granulocytes: 5 %
Lymphocytes Relative: 12 %
Lymphs Abs: 0.9 10*3/uL (ref 0.7–4.0)
MCH: 31 pg (ref 26.0–34.0)
MCHC: 33.9 g/dL (ref 30.0–36.0)
MCV: 91.5 fL (ref 80.0–100.0)
Monocytes Absolute: 0.5 10*3/uL (ref 0.1–1.0)
Monocytes Relative: 6 %
Neutro Abs: 5.6 10*3/uL (ref 1.7–7.7)
Neutrophils Relative %: 76 %
Platelets: 99 10*3/uL — ABNORMAL LOW (ref 150–400)
RBC: 3.16 MIL/uL — ABNORMAL LOW (ref 3.87–5.11)
RDW: 14.7 % (ref 11.5–15.5)
WBC: 7.3 10*3/uL (ref 4.0–10.5)
nRBC: 0.8 % — ABNORMAL HIGH (ref 0.0–0.2)

## 2019-02-05 LAB — PROTIME-INR
INR: 1.7 — ABNORMAL HIGH (ref 0.8–1.2)
INR: 1.7 — ABNORMAL HIGH (ref 0.8–1.2)
INR: 1.7 — ABNORMAL HIGH (ref 0.8–1.2)
INR: 1.6 — ABNORMAL HIGH (ref 0.8–1.2)
Prothrombin Time: 19.6 seconds — ABNORMAL HIGH (ref 11.4–15.2)
Prothrombin Time: 19.7 seconds — ABNORMAL HIGH (ref 11.4–15.2)
Prothrombin Time: 20 seconds — ABNORMAL HIGH (ref 11.4–15.2)
Prothrombin Time: 18.8 seconds — ABNORMAL HIGH (ref 11.4–15.2)

## 2019-02-05 LAB — BPAM PLATELET PHERESIS
Blood Product Expiration Date: 202010242359
Blood Product Expiration Date: 202010252359
ISSUE DATE / TIME: 202010230347
ISSUE DATE / TIME: 202010230347
Unit Type and Rh: 6200
Unit Type and Rh: 9500

## 2019-02-05 LAB — BPAM CRYOPRECIPITATE
Blood Product Expiration Date: 202010231114
Blood Product Expiration Date: 202010231119
ISSUE DATE / TIME: 202010230530
ISSUE DATE / TIME: 202010230536
Unit Type and Rh: 5100
Unit Type and Rh: 5100

## 2019-02-05 LAB — PREPARE PLATELET PHERESIS
Unit division: 0
Unit division: 0

## 2019-02-05 LAB — HEMOGLOBIN AND HEMATOCRIT, BLOOD
HCT: 30.3 % — ABNORMAL LOW (ref 36.0–46.0)
Hemoglobin: 10.4 g/dL — ABNORMAL LOW (ref 12.0–15.0)

## 2019-02-05 LAB — MAGNESIUM: Magnesium: 2 mg/dL (ref 1.7–2.4)

## 2019-02-05 MED ORDER — DOCUSATE SODIUM 50 MG/5ML PO LIQD
200.0000 mg | Freq: Every day | ORAL | Status: DC
  Administered 2019-02-06 – 2019-02-09 (×4): 200 mg
  Filled 2019-02-05 (×4): qty 20

## 2019-02-05 MED ORDER — PRISMASOL BGK 0/2.5 32-2.5 MEQ/L IV SOLN
INTRAVENOUS | Status: DC
  Administered 2019-02-05 – 2019-02-09 (×26): via INTRAVENOUS_CENTRAL
  Filled 2019-02-05 (×51): qty 5000

## 2019-02-05 MED ORDER — DOBUTAMINE IN D5W 4-5 MG/ML-% IV SOLN
2.5000 ug/kg/min | INTRAVENOUS | Status: DC
  Administered 2019-02-05 – 2019-02-08 (×2): 2.5 ug/kg/min via INTRAVENOUS
  Filled 2019-02-05: qty 250

## 2019-02-05 MED ORDER — MILRINONE LACTATE IN DEXTROSE 20-5 MG/100ML-% IV SOLN: 0.2500 ug/kg/min | INTRAVENOUS | Status: DC

## 2019-02-05 MED ORDER — INSULIN DETEMIR 100 UNIT/ML ~~LOC~~ SOLN
8.0000 [IU] | Freq: Two times a day (BID) | SUBCUTANEOUS | Status: DC
  Administered 2019-02-05 – 2019-02-10 (×11): 8 [IU] via SUBCUTANEOUS
  Filled 2019-02-05 (×12): qty 0.08

## 2019-02-05 MED ORDER — DOBUTAMINE IN D5W 4-5 MG/ML-% IV SOLN
INTRAVENOUS | Status: AC
  Filled 2019-02-05: qty 250

## 2019-02-05 MED ORDER — PANTOPRAZOLE SODIUM 40 MG IV SOLR
40.0000 mg | INTRAVENOUS | Status: DC
  Administered 2019-02-05 – 2019-02-10 (×6): 40 mg via INTRAVENOUS
  Filled 2019-02-05 (×7): qty 40

## 2019-02-05 MED ORDER — OXYCODONE HCL 5 MG PO TABS: 5.0000 mg | ORAL_TABLET | ORAL | Status: DC | PRN

## 2019-02-05 MED ORDER — INSULIN ASPART 100 UNIT/ML ~~LOC~~ SOLN
2.0000 [IU] | SUBCUTANEOUS | Status: DC
  Administered 2019-02-05 (×2): 4 [IU] via SUBCUTANEOUS
  Administered 2019-02-06 (×2): 2 [IU] via SUBCUTANEOUS
  Administered 2019-02-06 (×2): 4 [IU] via SUBCUTANEOUS
  Administered 2019-02-06: 2 [IU] via SUBCUTANEOUS
  Administered 2019-02-06 – 2019-02-07 (×3): 4 [IU] via SUBCUTANEOUS
  Administered 2019-02-07 (×2): 2 [IU] via SUBCUTANEOUS
  Administered 2019-02-07: 4 [IU] via SUBCUTANEOUS
  Administered 2019-02-08: 2 [IU] via SUBCUTANEOUS
  Administered 2019-02-08: 4 [IU] via SUBCUTANEOUS
  Administered 2019-02-08 (×2): 2 [IU] via SUBCUTANEOUS
  Administered 2019-02-08 – 2019-02-09 (×3): 4 [IU] via SUBCUTANEOUS
  Administered 2019-02-09 – 2019-02-10 (×4): 2 [IU] via SUBCUTANEOUS

## 2019-02-05 MED ORDER — ORAL CARE MOUTH RINSE
15.0000 mL | OROMUCOSAL | Status: DC
  Administered 2019-02-05 – 2019-02-10 (×53): 15 mL via OROMUCOSAL

## 2019-02-05 MED ORDER — CHLORHEXIDINE GLUCONATE 0.12% ORAL RINSE (MEDLINE KIT)
15.0000 mL | Freq: Two times a day (BID) | OROMUCOSAL | Status: DC
  Administered 2019-02-05 – 2019-02-10 (×12): 15 mL via OROMUCOSAL

## 2019-02-05 MED ORDER — MILRINONE LACTATE IN DEXTROSE 20-5 MG/100ML-% IV SOLN
0.2500 ug/kg/min | INTRAVENOUS | Status: DC
  Administered 2019-02-05 – 2019-02-08 (×6): 0.25 ug/kg/min via INTRAVENOUS
  Filled 2019-02-05 (×6): qty 100

## 2019-02-05 MED ORDER — NOREPINEPHRINE 16 MG/250ML-% IV SOLN
0.0000 ug/min | INTRAVENOUS | Status: DC
  Administered 2019-02-05: 2 ug/min via INTRAVENOUS
  Administered 2019-02-06: 12 ug/min via INTRAVENOUS
  Administered 2019-02-07: 26 ug/min via INTRAVENOUS
  Administered 2019-02-07: 16 ug/min via INTRAVENOUS
  Administered 2019-02-08: 29 ug/min via INTRAVENOUS
  Administered 2019-02-08: 40 ug/min via INTRAVENOUS
  Administered 2019-02-09: 16 ug/min via INTRAVENOUS
  Administered 2019-02-10: 4 ug/min via INTRAVENOUS
  Filled 2019-02-05 (×7): qty 250

## 2019-02-05 MED FILL — Medication: Qty: 1 | Status: AC

## 2019-02-05 NOTE — Procedures (Signed)
I evaluated the patient while on CRRT and discussed with bedside RN.  Dialysis is running without issue. 

## 2019-02-05 NOTE — Progress Notes (Signed)
2 Days Post-Op Procedure(s) (LRB): CORONARY ARTERY BYPASS GRAFTING (CABG) times three. Free IMA to LAD. Endoscopic harvesting of left greater saphenous vein to OM1 and Posterior Lateral (PLB). (N/A) TRANSESOPHAGEAL ECHOCARDIOGRAM (TEE) (N/A) Subjective: Intubated/sedate  Objective: Vital signs in last 24 hours: Temp:  [94.1 F (34.5 C)-98.2 F (36.8 C)] 96.4 F (35.8 C) (10/24 0715) Pulse Rate:  [28-87] 86 (10/24 0715) Cardiac Rhythm: A-V Sequential paced (10/24 0345) Resp:  [15-37] 18 (10/24 0715) BP: (70-152)/(45-114) 101/56 (10/24 0715) SpO2:  [91 %-100 %] 100 % (10/24 0715) Arterial Line BP: (73-155)/(36-58) 105/47 (10/24 0715) FiO2 (%):  [50 %-80 %] 50 % (10/24 0348) Weight:  [97.5 kg-102.2 kg] 97.5 kg (10/24 0715)  Hemodynamic parameters for last 24 hours: PAP: (25-38)/(13-29) 31/20 CVP:  [10 mmHg-17 mmHg] 13 mmHg CO:  [2.7 L/min-3.5 L/min] 3.5 L/min CI:  [1.5 L/min/m2-1.9 L/min/m2] 1.9 L/min/m2  Intake/Output from previous day: 10/23 0701 - 10/24 0700 In: 5132.6 [I.V.:2585.6; Blood:725; NG/GT:60; IV Piggyback:1762] Out: 7948 [Urine:245; Emesis/NG output:700; Chest Tube:900] Intake/Output this shift: Total I/O In: 79 [I.V.:79] Out: 197 [Urine:5; Other:192]  General appearance: sedated Neurologic: unable to assess Heart: regular rate and rhythm, S1, S2 normal, no murmur, click, rub or gallop Lungs: clear to auscultation bilaterally Abdomen: soft, non-tender; bowel sounds normal; no masses,  no organomegaly Extremities: edema 1+ Wound: covered sterilely; open chest  Lab Results: Recent Labs    02/04/19 1640  02/05/19 0339 02/05/19 0347  WBC 10.8*  --  9.1  --   HGB 8.6*   < > 10.8* 9.9*  HCT 24.1*   < > 30.9* 29.0*  PLT 119*  --  125*  --    < > = values in this interval not displayed.   BMET:  Recent Labs    02/04/19 2010 02/05/19 0339 02/05/19 0347  NA 141 141 140  K 3.9 4.0 3.9  CL 104 106  --   CO2 25 24  --   GLUCOSE 108* 100*  --   BUN 18  13  --   CREATININE 2.10* 1.68*  --   CALCIUM 7.9* 7.8*  --     PT/INR:  Recent Labs    02/05/19 0339  LABPROT 19.6*  INR 1.7*   ABG    Component Value Date/Time   PHART 7.480 (H) 02/05/2019 0347   HCO3 25.9 02/05/2019 0347   TCO2 27 02/05/2019 0347   ACIDBASEDEF 4.0 (H) 02/04/2019 0456   O2SAT 58.3 02/05/2019 0423   CBG (last 3)  Recent Labs    02/04/19 2347 02/05/19 0219 02/05/19 0344  GLUCAP 111* 106* 105*    Assessment/Plan: S/P Procedure(s) (LRB): CORONARY ARTERY BYPASS GRAFTING (CABG) times three. Free IMA to LAD. Endoscopic harvesting of left greater saphenous vein to OM1 and Posterior Lateral (PLB). (N/A) TRANSESOPHAGEAL ECHOCARDIOGRAM (TEE) (N/A) continue to support hemodynamically; HD   LOS: 5 days    Wonda Olds 02/05/2019

## 2019-02-05 NOTE — Progress Notes (Signed)
[facility]    NEPHROLOGY PROGRESS NOTE  SUBJECTIVE: Patient seen and examined.  Patient sedated on ventilator.  Tolerating CRRT well.  Unable to provide review of systems.  OBJECTIVE:  Vitals:   02/05/19 1300 02/05/19 1400  BP: (!) 122/57 (!) 125/51  Pulse:    Resp: 18 18  Temp: (!) 93.9 F (34.4 C) (!) 93.6 F (34.2 C)  SpO2: 100% 98%    Intake/Output Summary (Last 24 hours) at 02/05/2019 1426 Last data filed at 02/05/2019 1400 Gross per 24 hour  Intake 3069.81 ml  Output 5720 ml  Net -2650.19 ml      General: Sedated NAD HEENT: MMM [place] AT anicteric sclera Neck:  No JVD, no adenopathy CV:  Heart RRR, open chest Lungs:  L/S decreased bilaterally Abd: Distended with normal BS GU:  Bladder non-palpable Extremities: 3+ bilateral lower extremity edema. Skin:  No skin rash  MEDICATIONS:  . sodium chloride   Intravenous Once  . sodium chloride   Intravenous Once  . sodium chloride   Intravenous Once  . sodium chloride   Intravenous Once  . sodium chloride   Intravenous Once  . acetaminophen  1,000 mg Oral Q6H   Or  . acetaminophen (TYLENOL) oral liquid 160 mg/5 mL  1,000 mg Per Tube Q6H  . artificial tears  1 application Both Eyes J9E  . aspirin EC  325 mg Oral Daily   Or  . aspirin  324 mg Per Tube Daily  . atorvastatin  40 mg Oral q1800  . bisacodyl  10 mg Oral Daily   Or  . bisacodyl  10 mg Rectal Daily  . chlorhexidine gluconate (MEDLINE KIT)  15 mL Mouth Rinse BID  . Chlorhexidine Gluconate Cloth  6 each Topical Daily  . darbepoetin (ARANESP) injection - NON-DIALYSIS  60 mcg Subcutaneous Q Wed-1800  . docusate sodium  200 mg Oral Daily  . fentaNYL (SUBLIMAZE) injection  25 mcg Intravenous Once  . insulin aspart  2-6 Units Subcutaneous Q4H  . insulin detemir  8 Units Subcutaneous Q12H  . insulin regular  0-10 Units Intravenous TID WC  . mouth rinse  15 mL Mouth Rinse 10 times per day  . pantoprazole (PROTONIX) IV  40 mg Intravenous Q24H   . sodium chloride flush  10-40 mL Intracatheter Q12H  . sodium chloride flush  3 mL Intravenous Q12H       LABS:   CBC Latest Ref Rng & Units 02/05/2019 02/05/2019 02/05/2019  WBC 4.0 - 10.5 K/uL - - 9.1  Hemoglobin 12.0 - 15.0 g/dL 10.2(L) 9.9(L) 10.8(L)  Hematocrit 36.0 - 46.0 % 30.0(L) 29.0(L) 30.9(L)  Platelets 150 - 400 K/uL - - 125(L)    CMP Latest Ref Rng & Units 02/05/2019 02/05/2019 02/05/2019  Glucose 70 - 99 mg/dL - - 100(H)  BUN 8 - 23 mg/dL - - 13  Creatinine 0.44 - 1.00 mg/dL - - 1.68(H)  Sodium 135 - 145 mmol/L 139 140 141  Potassium 3.5 - 5.1 mmol/L 4.6 3.9 4.0  Chloride 98 - 111 mmol/L - - 106  CO2 22 - 32 mmol/L - - 24  Calcium 8.9 - 10.3 mg/dL - - 7.8(L)  Total Protein 6.5 - 8.1 g/dL - - -  Total Bilirubin 0.3 - 1.2 mg/dL - - -  Alkaline Phos 38 - 126 U/L - - -  AST 15 - 41 U/L - - -  ALT 0 - 44 U/L - - -    Lab Results  Component  Value Date   CALCIUM 7.8 (L) 02/05/2019   CAION 1.08 (L) 02/05/2019   PHOS 2.9 02/05/2019       Component Value Date/Time   COLORURINE YELLOW 02/01/2019 1518   APPEARANCEUR HAZY (A) 02/01/2019 1518   LABSPEC 1.016 02/01/2019 1518   PHURINE 5.0 02/01/2019 1518   GLUCOSEU 150 (A) 02/01/2019 1518   HGBUR NEGATIVE 02/01/2019 1518   BILIRUBINUR NEGATIVE 02/01/2019 1518   KETONESUR NEGATIVE 02/01/2019 1518   PROTEINUR 100 (A) 02/01/2019 1518   UROBILINOGEN 0.2 07/22/2013 1633   NITRITE NEGATIVE 02/01/2019 1518   LEUKOCYTESUR NEGATIVE 02/01/2019 1518      Component Value Date/Time   PHART 7.480 (H) 02/05/2019 0347   PCO2ART 34.4 02/05/2019 0347   PO2ART 65.0 (L) 02/05/2019 0347   HCO3 25.4 02/05/2019 1127   TCO2 27 02/05/2019 1127   ACIDBASEDEF 4.0 (H) 02/04/2019 0456   O2SAT 95.6 02/05/2019 1129       Component Value Date/Time   IRON 46 02/04/2019 0235   TIBC 237 (L) 01/15/2019 0235   FERRITIN 54 02/12/2019 0235   IRONPCTSAT 19 02/01/2019 0235       ASSESSMENT/PLAN:    73 year old white female with  longstanding diabetes mellitus with complications, likely diabetic nephropathy, diffuse vascular disease now presenting with unstable angina/acute coronary syndrome status post CABG on 01/17/2019.  1.  Chronic kidney disease stage IV.  Her baseline serum creatinine is in the low twos.  Likely secondary to diabetic nephropathy.  2.  Acute kidney injury.   Presumed ATN.  Marked fluid overload.  Continue CRRT  3.  Acute hypoxemic respiratory failure.  Likely secondary to volume overload in the setting of resuscitation.  4.  Cardiac/pericardial tamponade post CABG.  Improved with sternal opening.    5.  Metabolic alkalosis.  Likely secondary to blood product resuscitation and hypokalemia.  Improved with CRRT.  6.  Hypokalemia.  Improved with CRRT.    [place], DO, [place]

## 2019-02-05 NOTE — Progress Notes (Signed)
Advanced Heart Failure Rounding Note   Subjective:    Remains intubated/sedated. On CVVHD pulling -200/hr. On epi 6, milrinone 0.375, VP 0.04  Chest open  Co-ox 58%  Swan  CVP 15 PA 30/19 (22) Thermo 3.0/1.7     Objective:   Weight Range:  Vital Signs:   Temp:  [94.6 F (34.8 C)-98.2 F (36.8 C)] 96.4 F (35.8 C) (10/24 0715) Pulse Rate:  [28-87] 86 (10/24 0715) Resp:  [15-37] 18 (10/24 0715) BP: (70-152)/(45-114) 101/56 (10/24 0715) SpO2:  [91 %-100 %] 95 % (10/24 0819) Arterial Line BP: (73-155)/(36-58) 105/47 (10/24 0715) FiO2 (%):  [50 %-80 %] 50 % (10/24 0819) Weight:  [97.5 kg-102.2 kg] 98 kg (10/24 0800) Last BM Date: (PTA)  Weight change: Filed Weights   02/04/19 1500 02/05/19 0715 02/05/19 0800  Weight: 102.2 kg 97.5 kg 98 kg    Intake/Output:   Intake/Output Summary (Last 24 hours) at 02/05/2019 0847 Last data filed at 02/05/2019 0800 Gross per 24 hour  Intake 3758.6 ml  Output 4248 ml  Net -489.4 ml     Physical Exam: General:  Intubated sedated HEENT: normal + ETT Neck: supple. JVP 15 . + swan Carotids 2+ bilat; no bruits. No lymphadenopathy or thryomegaly appreciated. Cor: chest open with spacer. + CTs. Lungs: coarse Abdomen: obese soft, nontender, nondistended. No hepatosplenomegaly. No bruits or masses. hypoactive bowel sounds. Extremities: no cyanosis, clubbing, rash, 1+ edema + Femoral HD cath Neuro: sedated on vent  Telemetry: Sinus 70-80s with v-pacing Personally reviewed .  Labs: Basic Metabolic Panel: Recent Labs  Lab 01/13/2019 0235  01/23/2019 0052  02/04/19 0204  02/04/19 0750  02/04/19 1645 02/04/19 1825 02/04/19 1840 02/04/19 2010 02/05/19 0339 02/05/19 0347  NA 135   < > 135   < > 139   < > 145   < > 144  --  141 141 141 140  K 4.1   < > 3.6   < > 4.4   < > 3.2*   < > 3.9  --  4.0 3.9 4.0 3.9  CL 107  --  105   < > 106  --  106  --  105  --   --  104 106  --   CO2 18*  --  21*  --  22  --  25  --  26  --    --  25 24  --   GLUCOSE 210*  --  244*   < > 171*  --  94  --  98  --   --  108* 100*  --   BUN 33*  --  31*   < > 20  --  19  --  20  --   --  18 13  --   CREATININE 3.20*  --  2.98*   < > 2.30*  --  2.40*  --  2.57*  --   --  2.10* 1.68*  --   CALCIUM 8.6*  --  8.1*  --  7.0*  --  8.3*  --  8.0*  --   --  7.9* 7.8*  --   MG  --   --   --   --  1.4*  --   --   --   --  1.7  --   --  2.0  --   PHOS 4.3  --  3.5  --   --   --   --   --  4.6  --   --   --  2.9  --    < > = values in this interval not displayed.    Liver Function Tests: Recent Labs  Lab 01/24/2019 1028 01/19/2019 1402  01/14/2019 0052 02/04/19 0750 02/04/19 1645 02/04/19 2010 02/05/19 0339  AST 11 15  --   --  1,763*  --  5,358*  --   ALT 10 13  --   --  1,425*  --  2,916*  --   ALKPHOS 109 82  --   --  32*  --  46  --   BILITOT <0.2 0.5  --   --  2.6*  --  2.6*  --   PROT 6.0 6.5  --   --  4.2*  --  4.4*  --   ALBUMIN 3.7 3.3*   < > 2.6* 2.8* 2.7* 2.7* 2.8*   < > = values in this interval not displayed.   No results for input(s): LIPASE, AMYLASE in the last 168 hours. No results for input(s): AMMONIA in the last 168 hours.  CBC: Recent Labs  Lab 02/07/2019 1028 02/07/2019 1402  02/12/2019 2058  02/04/19 0204  02/04/19 0616  02/04/19 1328 02/04/19 1640 02/04/19 1840 02/05/19 0339 02/05/19 0347  WBC 10.2 10.8*   < > 12.2*  --  12.9*  --  7.3  --   --  10.8*  --  9.1  --   NEUTROABS 7.0 8.0*  --   --   --   --   --  5.6  --   --  7.3  --   --   --   HGB 11.0* 11.2*   < > 9.6*   < > 8.9*   < > 9.8*   < > 7.8* 8.6* 7.5* 10.8* 9.9*  HCT 32.9* 35.9*   < > 28.6*   < > 26.2*   < > 28.9*   < > 23.0* 24.1* 22.0* 30.9* 29.0*  MCV 86 88.4   < > 87.7  --  88.2  --  91.5  --   --  87.0  --  88.3  --   PLT 287 263   < > 111*  --  92*  --  99*  --   --  119*  --  125*  --    < > = values in this interval not displayed.    Cardiac Enzymes: No results for input(s): CKTOTAL, CKMB, CKMBINDEX, TROPONINI in the last 168 hours.   BNP: BNP (last 3 results) No results for input(s): BNP in the last 8760 hours.  ProBNP (last 3 results) No results for input(s): PROBNP in the last 8760 hours.    Other results:  Imaging: Dg Chest Port 1 View  Result Date: 02/07/2019 CLINICAL DATA:  Provided history of status post CABG x3. technologist notes state second image was after Dr. advanced a line. EXAM: PORTABLE CHEST 1 VIEW COMPARISON:  None. FINDINGS: 2110 hour: Endotracheal tube tip at the thoracic inlet. Enteric tube tip below the diaphragm, side-port in the region of the distal esophagus. Right internal jugular Swan-Ganz catheter tip in the region of the main pulmonary outflow tract. Mediastinal drain and bilateral chest tubes in place. Intra-aortic balloon pump tip at the level of T8. No visualized pneumothorax. Low lung volumes. Mild cardiomegaly likely accentuated by technique. No pulmonary edema or large pleural effusion. Mild retrocardiac atelectasis. No sternal wires are visualized. 2111 hr: The intra-aortic balloon pump tip is now  at the level of T7. No other interval change. IMPRESSION: 1. Support apparatus and drainage catheters as described. 2. Low lung volumes.  Mild retrocardiac atelectasis. Electronically Signed   By: Keith Rake M.D.   On: 01/13/2019 21:23      Medications:     Scheduled Medications: . sodium chloride   Intravenous Once  . sodium chloride   Intravenous Once  . sodium chloride   Intravenous Once  . sodium chloride   Intravenous Once  . sodium chloride   Intravenous Once  . acetaminophen  1,000 mg Oral Q6H   Or  . acetaminophen (TYLENOL) oral liquid 160 mg/5 mL  1,000 mg Per Tube Q6H  . artificial tears  1 application Both Eyes P5T  . aspirin EC  325 mg Oral Daily   Or  . aspirin  324 mg Per Tube Daily  . atorvastatin  40 mg Oral q1800  . bisacodyl  10 mg Oral Daily   Or  . bisacodyl  10 mg Rectal Daily  . chlorhexidine gluconate (MEDLINE KIT)  15 mL Mouth Rinse BID  .  Chlorhexidine Gluconate Cloth  6 each Topical Daily  . darbepoetin (ARANESP) injection - NON-DIALYSIS  60 mcg Subcutaneous Q Wed-1800  . docusate sodium  200 mg Oral Daily  . fentaNYL (SUBLIMAZE) injection  25 mcg Intravenous Once  . insulin aspart  2-6 Units Subcutaneous Q4H  . insulin detemir  8 Units Subcutaneous Q12H  . insulin regular  0-10 Units Intravenous TID WC  . mouth rinse  15 mL Mouth Rinse 10 times per day  . pantoprazole  40 mg Oral Daily  . sodium chloride flush  10-40 mL Intracatheter Q12H  . sodium chloride flush  3 mL Intravenous Q12H     Infusions: .  prismasol BGK 4/2.5 200 mL/hr at 02/04/19 1716  .  prismasol BGK 4/2.5 200 mL/hr at 02/04/19 1715  . sodium chloride    . sodium chloride    . sodium chloride 10 mL/hr (02/05/19 0315)  . cisatracurium (NIMBEX) infusion 1.5 mcg/kg/min (02/05/19 0827)  . epinephrine 6 mcg/min (02/05/19 0835)  . fentaNYL infusion INTRAVENOUS 50 mcg/hr (02/05/19 0800)  . insulin 1.2 mL/hr at 02/05/19 0800  . lactated ringers    . lactated ringers    . lactated ringers    . lactated ringers 5 mL/hr at 02/05/19 0800  . midazolam 4 mg/hr (02/05/19 0800)  . milrinone    . niCARDipine Stopped (02/05/19 0100)  . nitroGLYCERIN Stopped (02/04/19 1239)  . norepinephrine (LEVOPHED) Adult infusion    . phenylephrine (NEO-SYNEPHRINE) Adult infusion    . prismasol BGK 4/2.5 2,000 mL/hr at 02/05/19 0814  . vasopressin (PITRESSIN) infusion - *FOR SHOCK* 0.04 Units/min (02/05/19 0800)     PRN Medications:  sodium chloride, fentaNYL, heparin, lactated ringers, metoprolol tartrate, midazolam, morphine injection, ondansetron (ZOFRAN) IV, oxyCODONE, sodium chloride, sodium chloride flush, sodium chloride flush   Assessment/Plan:    1. Post-cardiotomy cardiogenic shock  - remains critically ill but more stable than yesterday - co-ox 58% on epi 6 and milrinone 0.375. however output on swan remains low at 3.1/1.7. SVR 1040 - Add dobutamine  2.5. Follow hemodynamics closely. Can wean VP as tolerated - suspect RV failure playing significant role. continue inotrope support  - fluid removal with CVVHD - hopefully chest can be closed early next week - intra-op TEE 50-55%. Can consider repeat TEE in the next day or two as needed - D/w Dr. Orvan Seen at bedside  2. Acute post-op  respiratory failure - on FiO2 50% currently.  - continue fluid removal and diuresis - CXR improved today (Personally reviewed) - wean vent as tolerated  3. CAD with NSTEMI - cath with 99% distal LM -  s/p CABG 10/22 - continue ASA/statin - no b-blocker with shock  4. AKI - due to shock/ATN - continue CVVHD  5. Shock liver - transaminases way up due to ischemic injury  - continue supportive care  6. Coagulopathy and ABL anemia - in setting of DAPT - replace products as needed - bleeding seems to have slowed. Hgb 9.9. PLTs 125   CRITICAL CARE Performed by: Glori Bickers  Total critical care time: 55 minutes  Critical care time was exclusive of separately billable procedures and treating other patients.  Critical care was necessary to treat or prevent imminent or life-threatening deterioration.  Critical care was time spent personally by me (independent of midlevel providers or residents) on the following activities: development of treatment plan with patient and/or surrogate as well as nursing, discussions with consultants, evaluation of patient's response to treatment, examination of patient, obtaining history from patient or surrogate, ordering and performing treatments and interventions, ordering and review of laboratory studies, ordering and review of radiographic studies, pulse oximetry and re-evaluation of patient's condition.     Length of Stay: [place] 02/05/2019, 8:47 AM  Advanced Heart Failure Team Pager [phone] (M-F; 7a - 4p)  Please contact [facility] for night-coverage after hours (4p -7a ) and  weekends on [email or web address]

## 2019-02-05 NOTE — CV Procedure (Signed)
  Attempted to place femoral arterial line for hemodynamic monitoring. Chose femoral approach over radial due to concern for subclavian stenoses and inaccurate pressures. Unable to gain access in RFA. Able to gain access in LFA but unable to feed wire.   The right wrist was prepped and draped in the routine sterile fashion a single lumen radial arterial catheter was placed in the right radial artery using a modified Seldinger technique and u/s guidance. Good blood flow and wave forms. A dressing was placed.     Paula Bickers, MD  1:08 PM

## 2019-02-05 NOTE — Progress Notes (Signed)
Dr Haroldine Laws placed an arterial line, but was unsure if it was in the vein or artery. ABG ran to confirm & pO2-35 confirming vein instead of artery. Dr Haroldine Laws made aware.  Ashley Mariner RRT

## 2019-02-06 ENCOUNTER — Inpatient Hospital Stay (HOSPITAL_COMMUNITY): Payer: Medicare Other

## 2019-02-06 DIAGNOSIS — I509 Heart failure, unspecified: Secondary | ICD-10-CM

## 2019-02-06 LAB — RENAL FUNCTION PANEL
Albumin: 2.4 g/dL — ABNORMAL LOW (ref 3.5–5.0)
Anion gap: 9 (ref 5–15)
BUN: 18 mg/dL (ref 8–23)
CO2: 23 mmol/L (ref 22–32)
Calcium: 7.3 mg/dL — ABNORMAL LOW (ref 8.9–10.3)
Chloride: 103 mmol/L (ref 98–111)
Creatinine, Ser: 1.45 mg/dL — ABNORMAL HIGH (ref 0.44–1.00)
GFR calc Af Amer: 41 mL/min — ABNORMAL LOW (ref >60)
GFR calc non Af Amer: 36 mL/min — ABNORMAL LOW (ref >60)
Glucose, Bld: 164 mg/dL — ABNORMAL HIGH (ref 70–99)
Phosphorus: 3.2 mg/dL (ref 2.5–4.6)
Potassium: 3.9 mmol/L (ref 3.5–5.1)
Sodium: 135 mmol/L (ref 135–145)

## 2019-02-06 LAB — COOXEMETRY PANEL
Carboxyhemoglobin: 1 % (ref 0.5–1.5)
Carboxyhemoglobin: 1.2 % (ref 0.5–1.5)
Methemoglobin: 1.3 % (ref 0.0–1.5)
Methemoglobin: 1.5 % (ref 0.0–1.5)
O2 Saturation: 63 %
O2 Saturation: 69.5 %
Total hemoglobin: 16.1 g/dL — ABNORMAL HIGH (ref 12.0–16.0)
Total hemoglobin: 10.5 g/dL — ABNORMAL LOW (ref 12.0–16.0)

## 2019-02-06 LAB — BASIC METABOLIC PANEL
Anion gap: 10 (ref 5–15)
BUN: 16 mg/dL (ref 8–23)
CO2: 23 mmol/L (ref 22–32)
Calcium: 7.5 mg/dL — ABNORMAL LOW (ref 8.9–10.3)
Chloride: 105 mmol/L (ref 98–111)
Creatinine, Ser: 1.6 mg/dL — ABNORMAL HIGH (ref 0.44–1.00)
GFR calc Af Amer: 37 mL/min — ABNORMAL LOW (ref >60)
GFR calc non Af Amer: 32 mL/min — ABNORMAL LOW (ref >60)
Glucose, Bld: 215 mg/dL — ABNORMAL HIGH (ref 70–99)
Potassium: 4.7 mmol/L (ref 3.5–5.1)
Sodium: 138 mmol/L (ref 135–145)

## 2019-02-06 LAB — CBC WITH DIFFERENTIAL/PLATELET
Abs Immature Granulocytes: 0.12 10*3/uL — ABNORMAL HIGH (ref 0.00–0.07)
Basophils Absolute: 0 10*3/uL (ref 0.0–0.1)
Basophils Relative: 0 %
Eosinophils Absolute: 0.1 10*3/uL (ref 0.0–0.5)
Eosinophils Relative: 0 %
HCT: 30.6 % — ABNORMAL LOW (ref 36.0–46.0)
Hemoglobin: 10.1 g/dL — ABNORMAL LOW (ref 12.0–15.0)
Immature Granulocytes: 1 %
Lymphocytes Relative: 9 %
Lymphs Abs: 1.1 10*3/uL (ref 0.7–4.0)
MCH: 30.8 pg (ref 26.0–34.0)
MCHC: 33 g/dL (ref 30.0–36.0)
MCV: 93.3 fL (ref 80.0–100.0)
Monocytes Absolute: 0.6 10*3/uL (ref 0.1–1.0)
Monocytes Relative: 5 %
Neutro Abs: 10.9 10*3/uL — ABNORMAL HIGH (ref 1.7–7.7)
Neutrophils Relative %: 85 %
Platelets: 94 10*3/uL — ABNORMAL LOW (ref 150–400)
RBC: 3.28 MIL/uL — ABNORMAL LOW (ref 3.87–5.11)
RDW: 15.5 % (ref 11.5–15.5)
WBC: 12.9 10*3/uL — ABNORMAL HIGH (ref 4.0–10.5)
nRBC: 1.1 % — ABNORMAL HIGH (ref 0.0–0.2)

## 2019-02-06 LAB — POCT I-STAT 7, (LYTES, BLD GAS, ICA,H+H)
Acid-base deficit: 3 mmol/L — ABNORMAL HIGH (ref 0.0–2.0)
Bicarbonate: 21.3 mmol/L (ref 20.0–28.0)
Calcium, Ion: 1.07 mmol/L — ABNORMAL LOW (ref 1.15–1.40)
HCT: 29 % — ABNORMAL LOW (ref 36.0–46.0)
Hemoglobin: 9.9 g/dL — ABNORMAL LOW (ref 12.0–15.0)
O2 Saturation: 94 %
Patient temperature: 35.2
Potassium: 4.6 mmol/L (ref 3.5–5.1)
Sodium: 136 mmol/L (ref 135–145)
TCO2: 22 mmol/L (ref 22–32)
pCO2 arterial: 33.3 mmHg (ref 32.0–48.0)
pH, Arterial: 7.405 (ref 7.350–7.450)
pO2, Arterial: 62 mmHg — ABNORMAL LOW (ref 83.0–108.0)

## 2019-02-06 LAB — BPAM RBC
Blood Product Expiration Date: 202011092359
Blood Product Expiration Date: 202011202359
Blood Product Expiration Date: 202011262359
Blood Product Expiration Date: 202011262359
Blood Product Expiration Date: 202011262359
Blood Product Expiration Date: 202011272359
Blood Product Expiration Date: 202011272359
Blood Product Expiration Date: 202011272359
Blood Product Expiration Date: 202011272359
Blood Product Expiration Date: 202011272359
Blood Product Expiration Date: 202011272359
ISSUE DATE / TIME: 202010202147
ISSUE DATE / TIME: 202010221514
ISSUE DATE / TIME: 202010221514
ISSUE DATE / TIME: 202010221707
ISSUE DATE / TIME: 202010222136
ISSUE DATE / TIME: 202010222136
ISSUE DATE / TIME: 202010230346
ISSUE DATE / TIME: 202010230346
ISSUE DATE / TIME: 202010232127
Unit Type and Rh: 5100
Unit Type and Rh: 5100
Unit Type and Rh: 5100
Unit Type and Rh: 5100
Unit Type and Rh: 5100
Unit Type and Rh: 5100
Unit Type and Rh: 5100
Unit Type and Rh: 5100
Unit Type and Rh: 5100
Unit Type and Rh: 5100
Unit Type and Rh: 5100

## 2019-02-06 LAB — TYPE AND SCREEN
ABO/RH(D): O POS
Antibody Screen: NEGATIVE
Unit division: 0
Unit division: 0
Unit division: 0
Unit division: 0
Unit division: 0
Unit division: 0
Unit division: 0
Unit division: 0
Unit division: 0
Unit division: 0
Unit division: 0

## 2019-02-06 LAB — PHOSPHORUS: Phosphorus: 4.3 mg/dL (ref 2.5–4.6)

## 2019-02-06 LAB — GLUCOSE, CAPILLARY
Glucose-Capillary: 184 mg/dL — ABNORMAL HIGH (ref 70–99)
Glucose-Capillary: 191 mg/dL — ABNORMAL HIGH (ref 70–99)
Glucose-Capillary: 181 mg/dL — ABNORMAL HIGH (ref 70–99)
Glucose-Capillary: 156 mg/dL — ABNORMAL HIGH (ref 70–99)
Glucose-Capillary: 149 mg/dL — ABNORMAL HIGH (ref 70–99)
Glucose-Capillary: 133 mg/dL — ABNORMAL HIGH (ref 70–99)

## 2019-02-06 LAB — PROTIME-INR
INR: 1.5 — ABNORMAL HIGH (ref 0.8–1.2)
Prothrombin Time: 18.3 seconds — ABNORMAL HIGH (ref 11.4–15.2)

## 2019-02-06 LAB — MAGNESIUM: Magnesium: 2.3 mg/dL (ref 1.7–2.4)

## 2019-02-06 LAB — ALBUMIN: Albumin: 2.6 g/dL — ABNORMAL LOW (ref 3.5–5.0)

## 2019-02-06 MED ORDER — EPINEPHRINE 1 MG/10ML IJ SOSY
0.5000 mg | PREFILLED_SYRINGE | Freq: Once | INTRAMUSCULAR | Status: AC
  Administered 2019-02-06: 0.5 mg via INTRAVENOUS

## 2019-02-06 MED ORDER — VANCOMYCIN HCL 10 G IV SOLR
2000.0000 mg | Freq: Once | INTRAVENOUS | Status: AC
  Administered 2019-02-06: 2000 mg via INTRAVENOUS
  Filled 2019-02-06: qty 2000

## 2019-02-06 MED ORDER — EPINEPHRINE 1 MG/10ML IJ SOSY
PREFILLED_SYRINGE | INTRAMUSCULAR | Status: AC
  Filled 2019-02-06: qty 10

## 2019-02-06 MED ORDER — SODIUM CHLORIDE 0.9 % IV SOLN
0.0000 ug/kg/min | INTRAVENOUS | Status: DC
  Administered 2019-02-06 – 2019-02-08 (×4): 2 ug/kg/min via INTRAVENOUS
  Filled 2019-02-06 (×5): qty 20

## 2019-02-06 MED ORDER — NEPRO/CARBSTEADY PO LIQD
1000.0000 mL | ORAL | Status: DC
  Administered 2019-02-06: 1000 mL

## 2019-02-06 MED ORDER — VANCOMYCIN HCL IN DEXTROSE 1-5 GM/200ML-% IV SOLN
1000.0000 mg | INTRAVENOUS | Status: DC
  Administered 2019-02-07: 1000 mg via INTRAVENOUS
  Filled 2019-02-06: qty 200

## 2019-02-06 MED ORDER — SODIUM CHLORIDE 0.9 % IV SOLN
2.0000 g | Freq: Two times a day (BID) | INTRAVENOUS | Status: DC
  Administered 2019-02-06 – 2019-02-08 (×5): 2 g via INTRAVENOUS
  Filled 2019-02-06 (×6): qty 2

## 2019-02-06 NOTE — Progress Notes (Signed)
[facility]    NEPHROLOGY PROGRESS NOTE  SUBJECTIVE: Patient seen and examined earlier this morning.  Case discussed at length with cardiology..  Patient sedated on ventilator.  Tolerating CRRT well.  Unable to provide review of systems.  OBJECTIVE:  Vitals:   02/06/19 1300 02/06/19 1315  BP: (!) 106/49 (!) 102/47  Pulse: 86 87  Resp: 18 18  Temp: (!) 97 F (36.1 C) (!) 97.2 F (36.2 C)  SpO2: 100% 99%    Intake/Output Summary (Last 24 hours) at 02/06/2019 1332 Last data filed at 02/06/2019 1300 Gross per 24 hour  Intake 2306.57 ml  Output 6397 ml  Net -4090.43 ml      General: Sedated NAD HEENT: MMM [place] AT anicteric sclera Neck:  No JVD, no adenopathy CV:  Heart RRR, open chest Lungs:  L/S decreased bilaterally Abd: Distended with normal BS GU:  Bladder non-palpable Extremities: 3+ bilateral lower extremity edema. Skin:  No skin rash  MEDICATIONS:  . sodium chloride   Intravenous Once  . sodium chloride   Intravenous Once  . sodium chloride   Intravenous Once  . sodium chloride   Intravenous Once  . sodium chloride   Intravenous Once  . acetaminophen  1,000 mg Oral Q6H   Or  . acetaminophen (TYLENOL) oral liquid 160 mg/5 mL  1,000 mg Per Tube Q6H  . artificial tears  1 application Both Eyes D3T  . aspirin EC  325 mg Oral Daily   Or  . aspirin  324 mg Per Tube Daily  . bisacodyl  10 mg Oral Daily   Or  . bisacodyl  10 mg Rectal Daily  . chlorhexidine gluconate (MEDLINE KIT)  15 mL Mouth Rinse BID  . Chlorhexidine Gluconate Cloth  6 each Topical Daily  . darbepoetin (ARANESP) injection - NON-DIALYSIS  60 mcg Subcutaneous Q Wed-1800  . docusate  200 mg Per Tube Daily  . EPINEPHrine      . feeding supplement (NEPRO CARB STEADY)  1,000 mL Per Tube Q24H  . fentaNYL (SUBLIMAZE) injection  25 mcg Intravenous Once  . insulin aspart  2-6 Units Subcutaneous Q4H  . insulin detemir  8 Units Subcutaneous Q12H  . insulin regular  0-10 Units Intravenous  TID WC  . mouth rinse  15 mL Mouth Rinse 10 times per day  . pantoprazole (PROTONIX) IV  40 mg Intravenous Q24H  . sodium chloride flush  10-40 mL Intracatheter Q12H  . sodium chloride flush  3 mL Intravenous Q12H       LABS:   CBC Latest Ref Rng & Units 02/06/2019 02/06/2019 02/05/2019  WBC 4.0 - 10.5 K/uL 12.9(H) - -  Hemoglobin 12.0 - 15.0 g/dL 10.1(L) 9.9(L) 10.4(L)  Hematocrit 36.0 - 46.0 % 30.6(L) 29.0(L) 30.3(L)  Platelets 150 - 400 K/uL 94(L) - -    CMP Latest Ref Rng & Units 02/06/2019 02/06/2019 02/05/2019  Glucose 70 - 99 mg/dL 215(H) - 162(H)  BUN 8 - 23 mg/dL 16 - 12  Creatinine 0.44 - 1.00 mg/dL 1.60(H) - 1.44(H)  Sodium 135 - 145 mmol/L 138 136 138  Potassium 3.5 - 5.1 mmol/L 4.7 4.6 5.2(H)  Chloride 98 - 111 mmol/L 105 - 107  CO2 22 - 32 mmol/L 23 - 22  Calcium 8.9 - 10.3 mg/dL 7.5(L) - 7.4(L)  Total Protein 6.5 - 8.1 g/dL - - -  Total Bilirubin 0.3 - 1.2 mg/dL - - -  Alkaline Phos 38 - 126 U/L - - -  AST 15 -  41 U/L - - -  ALT 0 - 44 U/L - - -    Lab Results  Component Value Date   CALCIUM 7.5 (L) 02/06/2019   CAION 1.07 (L) 02/06/2019   PHOS 4.3 02/06/2019       Component Value Date/Time   COLORURINE YELLOW 02/01/2019 1518   APPEARANCEUR HAZY (A) 02/01/2019 1518   LABSPEC 1.016 02/01/2019 1518   PHURINE 5.0 02/01/2019 1518   GLUCOSEU 150 (A) 02/01/2019 1518   HGBUR NEGATIVE 02/01/2019 1518   BILIRUBINUR NEGATIVE 02/01/2019 1518   [place] 02/01/2019 1518   PROTEINUR 100 (A) 02/01/2019 1518   UROBILINOGEN 0.2 07/22/2013 1633   NITRITE NEGATIVE 02/01/2019 1518   LEUKOCYTESUR NEGATIVE 02/01/2019 1518      Component Value Date/Time   PHART 7.405 02/06/2019 0358   PCO2ART 33.3 02/06/2019 0358   PO2ART 62.0 (L) 02/06/2019 0358   HCO3 21.3 02/06/2019 0358   TCO2 22 02/06/2019 0358   ACIDBASEDEF 3.0 (H) 02/06/2019 0358   O2SAT 69.5 02/06/2019 0915       Component Value Date/Time   IRON 46 02/09/2019 0235   TIBC 237 (L) 01/22/2019  0235   FERRITIN 54 02/05/2019 0235   IRONPCTSAT 19 01/13/2019 0235       ASSESSMENT/PLAN:    73 year old white female with longstanding diabetes mellitus with complications, likely diabetic nephropathy, diffuse vascular disease now presenting with unstable angina/acute coronary syndrome status post CABG on 01/21/2019.  1.  Chronic kidney disease stage IV.  Her baseline serum creatinine is in the low twos.  Likely secondary to diabetic nephropathy.  2.  Acute kidney injury.   Presumed ATN.  Marked fluid overload.  Continue CRRT.  We will continue current net ultrafiltration rate.  Is still about 10 kg over her admission weight.  3.  Acute hypoxemic respiratory failure.  Likely secondary to volume overload in the setting of resuscitation.  4.  Cardiac/pericardial tamponade post CABG.  Improved with sternal opening.    5.  Metabolic alkalosis.  Likely secondary to blood product resuscitation and hypokalemia.  Improved with CRRT.  6.  Hypokalemia.  Improved with CRRT.    [place], DO, [place]

## 2019-02-06 NOTE — Progress Notes (Addendum)
  Echocardiogram Echocardiogram Transesophageal has been performed.  Paula Watson 02/06/2019, 12:22 PM

## 2019-02-06 NOTE — Progress Notes (Signed)
Attempted to place radial arterial line. With u/s evaluation there was no significant arterial signal in either radial artery.   Attempted to place right brachial arterial line. Good flow with u/s. Able to cannulate vessel with nice pulsatile flow but unable to pass wire despite multiple attempts.   Glori Bickers, MD  12:18 PM

## 2019-02-06 NOTE — Progress Notes (Signed)
Pharmacy Antibiotic Note  Paula Watson is a 73 y.o. 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 Patient has had several complications postop, now with concern for infection pharmacy has been consulted for vancomycin and cefepime dosing.  Patient was predialysis prior to procedure and is currently on CRRT d/t pressor requirements, tolerating renal replacement well. Will adjust antibiotic doses. Currently afebrile, wbc slightly elevated at 12.9.   Plan: Cefepime 2g q12 hours while on CRRT  Vancomycin 2g IV now then 1g q24 hours while on CRRT  Height: 5\' 2"  (157.5 cm) Weight: 199 lb 11.8 oz (90.6 kg) IBW/kg (Calculated) : 50.1  Temp (24hrs), Avg:95.2 F (35.1 C), Min:93.6 F (34.2 C), Max:96.8 F (36 C)  Recent Labs  Lab 02/04/19 0204 02/04/19 0616  02/04/19 1640 02/04/19 1645 02/04/19 2010 02/05/19 0339 02/05/19 1559 02/06/19 0416  WBC 12.9* 7.3  --  10.8*  --   --  9.1  --  12.9*  CREATININE 2.30*  --    < >  --  2.57* 2.10* 1.68* 1.44* 1.60*   < > = values in this interval not displayed.    Estimated Creatinine Clearance: 32.8 mL/min (A) (by C-G formula based on SCr of 1.6 mg/dL (H)).    Allergies  Allergen Reactions  . Penicillins Hives and Swelling    PATIENT HAS HAD A PCN REACTION WITH IMMEDIATE RASH, FACIAL/TONGUE/THROAT SWELLING, SOB, OR LIGHTHEADEDNESS WITH HYPOTENSION:  #  #  #  YES  #  #  #   Has patient had a PCN reaction causing severe rash involving mucus membranes or skin necrosis: Unknown Has patient had a PCN reaction that required hospitalization: No Has patient had a PCN reaction occurring within the last 10 years: No     Antimicrobials this admission: Cefepime 10/25> Vanc 10/25>   Microbiology results: 10/24 bld x 2:  10/21 MRSA pcr neg  Thank you for allowing pharmacy to be a part of this patient's care.  Erin Hearing PharmD., BCPS Clinical Pharmacist 02/06/2019 2:36 PM

## 2019-02-06 NOTE — Procedures (Signed)
I evaluated the patient while on CRRT and discussed with bedside RN.  Dialysis is running without issue. 

## 2019-02-06 NOTE — Progress Notes (Signed)
   Patient with progressive hypotension after TEE with SBP into 60s.  Given two 1/2 amps of epi with improvement.   No obvious sites of bleeding or other complications.   Eventually discovered that IV connection with pressors disconnected during TEE. Line reconnected.   CVVHD held and some fluid given back. Drips titrated to catch up.   Given hypothermia will start broad spectrum abx.   Additional CCT 45 mins.   Glori Bickers, MD  12:23 PM

## 2019-02-06 NOTE — Progress Notes (Signed)
Advanced Heart Failure Rounding Note   Subjective:    Remains intubated/sedated/paralyzed. On CVVHD pulling -200/hr. On epi 6, dobutamine 2.5, milrinone 0.375, VP 0.04. NE added yesterday now at 9.   Weight down 17 pounds but still up about 20 pounds. Co-ox 69% but thermo outputs don't correlate.   Chest open  Co-ox 58%  Swan  CVP 10 PA 25/17 (21) Thermo 2.9/1.6     Objective:   Weight Range:  Vital Signs:   Temp:  [93.6 F (34.2 C)-96.8 F (36 C)] 96.8 F (36 C) (10/25 0800) Pulse Rate:  [77-86] 86 (10/25 0800) Resp:  [16-24] 18 (10/25 0800) BP: (87-125)/(44-81) 98/49 (10/25 0800) SpO2:  [86 %-100 %] 96 % (10/25 0800) Arterial Line BP: (40-125)/(36-106) 50/39 (10/24 1930) FiO2 (%):  [60 %-100 %] 80 % (10/25 0800) Weight:  [90.6 kg] 90.6 kg (10/25 0430) Last BM Date: (PTA)  Weight change: Filed Weights   02/05/19 0715 02/05/19 0800 02/06/19 0430  Weight: 97.5 kg 98 kg 90.6 kg    Intake/Output:   Intake/Output Summary (Last 24 hours) at 02/06/2019 0939 Last data filed at 02/06/2019 0900 Gross per 24 hour  Intake 2291.58 ml  Output 6868 ml  Net -4576.42 ml     Physical Exam: General:  Intubated/sedated on vent  HEENT: +ETT Neck: supple. JVP 10 Carotids 2+ bilat; no bruits. No lymphadenopathy or thryomegaly appreciated. Cor: Chest open + CTs Lungs: coars Abdomen: obese, nontender, nondistended. No hepatosplenomegaly. No bruits or masses. Hypoactive bowel sounds. Extremities: no cyanosis, clubbing, rash, diffuse upper extremity edema  RFV dialysis cath Neuro: intubated/sedated   Telemetry: Sinus 80s. Personally reviewed  .  Labs: Basic Metabolic Panel: Recent Labs  Lab 01/27/2019 0052  02/04/19 0204  02/04/19 1645 02/04/19 1825  02/04/19 2010 02/05/19 0339 02/05/19 0347 02/05/19 1127 02/05/19 1559 02/06/19 0358 02/06/19 0416  NA 135   < > 139   < > 144  --    < > 141 141 140 139 138 136 138  K 3.6   < > 4.4   < > 3.9  --    < > 3.9  4.0 3.9 4.6 5.2* 4.6 4.7  CL 105   < > 106   < > 105  --   --  104 106  --   --  107  --  105  CO2 21*  --  22   < > 26  --   --  25 24  --   --  22  --  23  GLUCOSE 244*   < > 171*   < > 98  --   --  108* 100*  --   --  162*  --  215*  BUN 31*   < > 20   < > 20  --   --  18 13  --   --  12  --  16  CREATININE 2.98*   < > 2.30*   < > 2.57*  --   --  2.10* 1.68*  --   --  1.44*  --  1.60*  CALCIUM 8.1*  --  7.0*   < > 8.0*  --   --  7.9* 7.8*  --   --  7.4*  --  7.5*  MG  --   --  1.4*  --   --  1.7  --   --  2.0  --   --   --   --  2.3  PHOS 3.5  --   --   --  4.6  --   --   --  2.9  --   --  3.6  --  4.3   < > = values in this interval not displayed.    Liver Function Tests: Recent Labs  Lab 01/19/2019 1028 01/21/2019 1402  02/04/19 0750 02/04/19 1645 02/04/19 2010 02/05/19 0339 02/05/19 1559 02/06/19 0416  AST 11 15  --  1,763*  --  5,358*  --   --   --   ALT 10 13  --  1,425*  --  2,916*  --   --   --   ALKPHOS 109 82  --  32*  --  46  --   --   --   BILITOT <0.2 0.5  --  2.6*  --  2.6*  --   --   --   PROT 6.0 6.5  --  4.2*  --  4.4*  --   --   --   ALBUMIN 3.7 3.3*   < > 2.8* 2.7* 2.7* 2.8* 2.8* 2.6*   < > = values in this interval not displayed.   No results for input(s): LIPASE, AMYLASE in the last 168 hours. No results for input(s): AMMONIA in the last 168 hours.  CBC: Recent Labs  Lab 01/16/2019 1028 01/20/2019 1402  02/04/19 0204  02/04/19 0616  02/04/19 1640  02/05/19 0339 02/05/19 0347 02/05/19 1127 02/05/19 1332 02/06/19 0358 02/06/19 0416  WBC 10.2 10.8*   < > 12.9*  --  7.3  --  10.8*  --  9.1  --   --   --   --  12.9*  NEUTROABS 7.0 8.0*  --   --   --  5.6  --  7.3  --   --   --   --   --   --  10.9*  HGB 11.0* 11.2*   < > 8.9*   < > 9.8*   < > 8.6*   < > 10.8* 9.9* 10.2* 10.4* 9.9* 10.1*  HCT 32.9* 35.9*   < > 26.2*   < > 28.9*   < > 24.1*   < > 30.9* 29.0* 30.0* 30.3* 29.0* 30.6*  MCV 86 88.4   < > 88.2  --  91.5  --  87.0  --  88.3  --   --   --   --   93.3  PLT 287 263   < > 92*  --  99*  --  119*  --  125*  --   --   --   --  94*   < > = values in this interval not displayed.    Cardiac Enzymes: No results for input(s): CKTOTAL, CKMB, CKMBINDEX, TROPONINI in the last 168 hours.  BNP: BNP (last 3 results) No results for input(s): BNP in the last 8760 hours.  ProBNP (last 3 results) No results for input(s): PROBNP in the last 8760 hours.    Other results:  Imaging: Dg Chest Port 1 View  Result Date: 02/05/2019 CLINICAL DATA:  Hypoxia EXAM: PORTABLE CHEST 1 VIEW COMPARISON:  February 03, 2019 FINDINGS: Endotracheal tube tip is 3.3 cm above the carina. Nasogastric tube tip is in the proximal stomach with the side port at the gastroesophageal junction. Swan-Ganz catheter tip is in the region of the main pulmonary outflow tract. There is a left chest tube and a mediastinal drain. Temporary pacemaker wires are attached to the  right heart. No pneumothorax. There is a fairly small right pleural effusion with right base atelectasis. The lungs elsewhere are clear. Heart is mildly enlarged with pulmonary vascularity normal. Status post coronary artery bypass grafting. No bone lesions. IMPRESSION: Tube and catheter positions as described without appreciable pneumothorax. There is a small right pleural effusion with right base atelectasis. No consolidation. Stable cardiac silhouette. Electronically Signed   By: Lowella Grip III M.D.   On: 02/05/2019 12:19     Medications:     Scheduled Medications: . sodium chloride   Intravenous Once  . sodium chloride   Intravenous Once  . sodium chloride   Intravenous Once  . sodium chloride   Intravenous Once  . sodium chloride   Intravenous Once  . acetaminophen  1,000 mg Oral Q6H   Or  . acetaminophen (TYLENOL) oral liquid 160 mg/5 mL  1,000 mg Per Tube Q6H  . artificial tears  1 application Both Eyes D7O  . aspirin EC  325 mg Oral Daily   Or  . aspirin  324 mg Per Tube Daily  . bisacodyl   10 mg Oral Daily   Or  . bisacodyl  10 mg Rectal Daily  . chlorhexidine gluconate (MEDLINE KIT)  15 mL Mouth Rinse BID  . Chlorhexidine Gluconate Cloth  6 each Topical Daily  . darbepoetin (ARANESP) injection - NON-DIALYSIS  60 mcg Subcutaneous Q Wed-1800  . docusate  200 mg Per Tube Daily  . feeding supplement (NEPRO CARB STEADY)  1,000 mL Per Tube Q24H  . fentaNYL (SUBLIMAZE) injection  25 mcg Intravenous Once  . insulin aspart  2-6 Units Subcutaneous Q4H  . insulin detemir  8 Units Subcutaneous Q12H  . insulin regular  0-10 Units Intravenous TID WC  . mouth rinse  15 mL Mouth Rinse 10 times per day  . pantoprazole (PROTONIX) IV  40 mg Intravenous Q24H  . sodium chloride flush  10-40 mL Intracatheter Q12H  . sodium chloride flush  3 mL Intravenous Q12H    Infusions: .  prismasol BGK 4/2.5 200 mL/hr at 02/05/19 1813  .  prismasol BGK 4/2.5 200 mL/hr at 02/05/19 1708  . sodium chloride    . sodium chloride    . sodium chloride 10 mL/hr at 02/06/19 0759  . cisatracurium (NIMBEX) infusion 2.0918 mcg/kg/min (02/06/19 0900)  . DOBUTamine 2.5 mcg/kg/min (02/06/19 0900)  . epinephrine 5 mcg/min (02/06/19 0900)  . fentaNYL infusion INTRAVENOUS 50 mcg/hr (02/06/19 0900)  . insulin Stopped (02/05/19 1229)  . lactated ringers    . lactated ringers    . lactated ringers 10 mL/hr at 02/06/19 0900  . lactated ringers 10 mL/hr at 02/06/19 0900  . midazolam 4 mg/hr (02/06/19 0900)  . milrinone 0.2513 mcg/kg/min (02/06/19 0900)  . nitroGLYCERIN Stopped (02/04/19 1239)  . norepinephrine (LEVOPHED) Adult infusion 9 mcg/min (02/06/19 0900)  . phenylephrine (NEO-SYNEPHRINE) Adult infusion    . prismasol BGK 2/2.5 dialysis solution 2,000 mL/hr at 02/06/19 0738  . vasopressin (PITRESSIN) infusion - *FOR SHOCK* 0.04 Units/min (02/06/19 0900)    PRN Medications: sodium chloride, fentaNYL, heparin, lactated ringers, metoprolol tartrate, midazolam, morphine injection, ondansetron (ZOFRAN) IV,  oxyCODONE, sodium chloride, sodium chloride flush, sodium chloride flush   Assessment/Plan:    1. Post-cardiotomy cardiogenic shock  - - intra-op TEE 50-55%.  - remains critically ill - co-ox 69% on epi 6, NE 9, VP 0.04 and milrinone 0.375. however output on swan remains low at 2.9/1.6. - Will cut back milrinone to 0.25. Wean VP  as tolerated - suspect RV failure playing significant role. continue inotrope support. Will check TEE today - continue fluid removal with CVVHD to hopefully permit chest closure early this week. D/w Dr. Orvan Seen  2. Acute post-op respiratory failure - on FiO2 50% currently.  - continue fluid removal and diuresis. Still with 20 pounds on board - D/w Dr. Grayland Ormond (Renal)  3. CAD with NSTEMI - cath with 99% distal LM -  s/p CABG 10/22 - continue ASA/statin - no b-blocker with shock  4. AKI - due to shock/ATN - continue CVVHD  5. Shock liver - transaminases way up due to ischemic injury  - continue supportive care. Much improved today  6. Coagulopathy and ABL anemia - in setting of DAPT - replace products as needed - bleeding seems to have slowed. Hgb 10.1. PLTs 125-> 94k. Need to follw closelt   CRITICAL CARE Performed by: Glori Bickers  Total critical care time: 45 minutes  Critical care time was exclusive of separately billable procedures and treating other patients.  Critical care was necessary to treat or prevent imminent or life-threatening deterioration.  Critical care was time spent personally by me (independent of midlevel providers or residents) on the following activities: development of treatment plan with patient and/or surrogate as well as nursing, discussions with consultants, evaluation of patient's response to treatment, examination of patient, obtaining history from patient or surrogate, ordering and performing treatments and interventions, ordering and review of laboratory studies, ordering and review of radiographic  studies, pulse oximetry and re-evaluation of patient's condition.     Length of Stay: 6   Glori Bickers MD 02/06/2019, 9:39 AM  Advanced Heart Failure Team Pager [phone] (M-F; [place])  Please contact [facility] for night-coverage after hours (4p -7a ) and weekends on [email or web address]

## 2019-02-06 NOTE — CV Procedure (Signed)
   Bedside TEE  Patient sedated and paralyzed on vent. Emergent consent implied.   Able to pass scope into esophagus after several attempts but no images on TEE screen.   Probe and echo machine switched. Probe passed again.   LVEF: 50-55%  RV: Moderately HK with mild external compression  LA: Dilated  RA: Normal  AV: Trileaflet. No AI/AS  MV: Normal Trivial MR  TV. Normal. Trivial TR  PV. Normal. Mild PR  LAA: No clot  Aorta: Severe plaque.   Glori Bickers, MD  12:21 PM

## 2019-02-06 NOTE — Progress Notes (Signed)
3 Days Post-Op Procedure(s) (LRB): CORONARY ARTERY BYPASS GRAFTING (CABG) times three. Free IMA to LAD. Endoscopic harvesting of left greater saphenous vein to OM1 and Posterior Lateral (PLB). (N/A) TRANSESOPHAGEAL ECHOCARDIOGRAM (TEE) (N/A) Subjective: intubated  Objective: Vital signs in last 24 hours: Temp:  [93.6 F (34.2 C)-96.8 F (36 C)] 96.6 F (35.9 C) (10/25 0700) Pulse Rate:  [77-86] 86 (10/25 0700) Cardiac Rhythm: Ventricular paced (10/25 0600) Resp:  [16-24] 18 (10/25 0700) BP: (87-125)/(44-81) 92/49 (10/25 0700) SpO2:  [86 %-100 %] 100 % (10/25 0700) Arterial Line BP: (40-125)/(36-106) 50/39 (10/24 1930) FiO2 (%):  [50 %-100 %] 100 % (10/25 0600) Weight:  [90.6 kg-98 kg] 90.6 kg (10/25 0430)  Hemodynamic parameters for last 24 hours: PAP: (27-39)/(17-26) 28/22 CVP:  [14 mmHg-21 mmHg] 16 mmHg CO:  [2.7 L/min-3.4 L/min] 2.7 L/min CI:  [1.5 L/min/m2-1.9 L/min/m2] 1.5 L/min/m2  Intake/Output from previous day: 10/24 0701 - 10/25 0700 In: 2257.4 [I.V.:2117.4; NG/GT:140] Out: 4259 [Urine:30; Emesis/NG output:50; Chest Tube:130] Intake/Output this shift: No intake/output data recorded.  General appearance: sedated Neurologic: unable to assess Heart: regular rate and rhythm, S1, S2 normal, no murmur, click, rub or gallop Lungs: clear to auscultation bilaterally Abdomen: soft, non-tender; bowel sounds normal; no masses,  no organomegaly Extremities: mottled RLE Wound: dressing dry  Lab Results: Recent Labs    02/05/19 0339  02/06/19 0358 02/06/19 0416  WBC 9.1  --   --  12.9*  HGB 10.8*   < > 9.9* 10.1*  HCT 30.9*   < > 29.0* 30.6*  PLT 125*  --   --  94*   < > = values in this interval not displayed.   BMET:  Recent Labs    02/05/19 1559 02/06/19 0358 02/06/19 0416  NA 138 136 138  K 5.2* 4.6 4.7  CL 107  --  105  CO2 22  --  23  GLUCOSE 162*  --  215*  BUN 12  --  16  CREATININE 1.44*  --  1.60*  CALCIUM 7.4*  --  7.5*    PT/INR:  Recent  Labs    02/06/19 0416  LABPROT 18.3*  INR 1.5*   ABG    Component Value Date/Time   PHART 7.405 02/06/2019 0358   HCO3 21.3 02/06/2019 0358   TCO2 22 02/06/2019 0358   ACIDBASEDEF 3.0 (H) 02/06/2019 0358   O2SAT 63.0 02/06/2019 0445   CBG (last 3)  Recent Labs    02/05/19 1933 02/05/19 2322 02/06/19 0355  GLUCAP 100* 184* 191*    Assessment/Plan: S/P Procedure(s) (LRB): CORONARY ARTERY BYPASS GRAFTING (CABG) times three. Free IMA to LAD. Endoscopic harvesting of left greater saphenous vein to OM1 and Posterior Lateral (PLB). (N/A) TRANSESOPHAGEAL ECHOCARDIOGRAM (TEE) (N/A) continue present therapy for fluid removal; start trickle tube feeds   LOS: 6 days    Wonda Olds 02/06/2019

## 2019-02-07 ENCOUNTER — Inpatient Hospital Stay (HOSPITAL_COMMUNITY): Payer: Medicare Other

## 2019-02-07 LAB — RENAL FUNCTION PANEL
Albumin: 2.6 g/dL — ABNORMAL LOW (ref 3.5–5.0)
Anion gap: 11 (ref 5–15)
BUN: 16 mg/dL (ref 8–23)
CO2: 23 mmol/L (ref 22–32)
Calcium: 7.4 mg/dL — ABNORMAL LOW (ref 8.9–10.3)
Chloride: 102 mmol/L (ref 98–111)
Creatinine, Ser: 1.24 mg/dL — ABNORMAL HIGH (ref 0.44–1.00)
GFR calc Af Amer: 50 mL/min — ABNORMAL LOW (ref >60)
GFR calc non Af Amer: 43 mL/min — ABNORMAL LOW (ref >60)
Glucose, Bld: 166 mg/dL — ABNORMAL HIGH (ref 70–99)
Phosphorus: 3.1 mg/dL (ref 2.5–4.6)
Potassium: 3.6 mmol/L (ref 3.5–5.1)
Sodium: 136 mmol/L (ref 135–145)

## 2019-02-07 LAB — CBC WITH DIFFERENTIAL/PLATELET
Abs Immature Granulocytes: 0.19 10*3/uL — ABNORMAL HIGH (ref 0.00–0.07)
Basophils Absolute: 0 10*3/uL (ref 0.0–0.1)
Basophils Relative: 0 %
Eosinophils Absolute: 0.1 10*3/uL (ref 0.0–0.5)
Eosinophils Relative: 1 %
HCT: 30.5 % — ABNORMAL LOW (ref 36.0–46.0)
Hemoglobin: 10.1 g/dL — ABNORMAL LOW (ref 12.0–15.0)
Immature Granulocytes: 1 %
Lymphocytes Relative: 7 %
Lymphs Abs: 1.5 10*3/uL (ref 0.7–4.0)
MCH: 31 pg (ref 26.0–34.0)
MCHC: 33.1 g/dL (ref 30.0–36.0)
MCV: 93.6 fL (ref 80.0–100.0)
Monocytes Absolute: 1.3 10*3/uL — ABNORMAL HIGH (ref 0.1–1.0)
Monocytes Relative: 6 %
Neutro Abs: 16.8 10*3/uL — ABNORMAL HIGH (ref 1.7–7.7)
Neutrophils Relative %: 85 %
Platelets: 89 10*3/uL — ABNORMAL LOW (ref 150–400)
RBC: 3.26 MIL/uL — ABNORMAL LOW (ref 3.87–5.11)
RDW: 15.4 % (ref 11.5–15.5)
WBC: 19.8 10*3/uL — ABNORMAL HIGH (ref 4.0–10.5)
nRBC: 1.1 % — ABNORMAL HIGH (ref 0.0–0.2)

## 2019-02-07 LAB — POCT I-STAT 7, (LYTES, BLD GAS, ICA,H+H)
Acid-base deficit: 2 mmol/L (ref 0.0–2.0)
Bicarbonate: 23.1 mmol/L (ref 20.0–28.0)
Calcium, Ion: 1.07 mmol/L — ABNORMAL LOW (ref 1.15–1.40)
HCT: 30 % — ABNORMAL LOW (ref 36.0–46.0)
Hemoglobin: 10.2 g/dL — ABNORMAL LOW (ref 12.0–15.0)
O2 Saturation: 98 %
Patient temperature: 36.5
Potassium: 3.6 mmol/L (ref 3.5–5.1)
Sodium: 137 mmol/L (ref 135–145)
TCO2: 24 mmol/L (ref 22–32)
pCO2 arterial: 37.4 mmHg (ref 32.0–48.0)
pH, Arterial: 7.397 (ref 7.350–7.450)
pO2, Arterial: 100 mmHg (ref 83.0–108.0)

## 2019-02-07 LAB — GLUCOSE, CAPILLARY
Glucose-Capillary: 139 mg/dL — ABNORMAL HIGH (ref 70–99)
Glucose-Capillary: 121 mg/dL — ABNORMAL HIGH (ref 70–99)
Glucose-Capillary: 132 mg/dL — ABNORMAL HIGH (ref 70–99)
Glucose-Capillary: 159 mg/dL — ABNORMAL HIGH (ref 70–99)
Glucose-Capillary: 164 mg/dL — ABNORMAL HIGH (ref 70–99)

## 2019-02-07 LAB — COOXEMETRY PANEL
Carboxyhemoglobin: 1.1 % (ref 0.5–1.5)
Methemoglobin: 1.3 % (ref 0.0–1.5)
O2 Saturation: 61.4 %
Total hemoglobin: 19.1 g/dL — ABNORMAL HIGH (ref 12.0–16.0)

## 2019-02-07 LAB — COMPREHENSIVE METABOLIC PANEL
ALT: 1760 U/L — ABNORMAL HIGH (ref 0–44)
AST: 976 U/L — ABNORMAL HIGH (ref 15–41)
Albumin: 2.2 g/dL — ABNORMAL LOW (ref 3.5–5.0)
Alkaline Phosphatase: 81 U/L (ref 38–126)
Anion gap: 10 (ref 5–15)
BUN: 16 mg/dL (ref 8–23)
CO2: 23 mmol/L (ref 22–32)
Calcium: 7.3 mg/dL — ABNORMAL LOW (ref 8.9–10.3)
Chloride: 106 mmol/L (ref 98–111)
Creatinine, Ser: 1.31 mg/dL — ABNORMAL HIGH (ref 0.44–1.00)
GFR calc Af Amer: 47 mL/min — ABNORMAL LOW (ref >60)
GFR calc non Af Amer: 40 mL/min — ABNORMAL LOW (ref >60)
Glucose, Bld: 151 mg/dL — ABNORMAL HIGH (ref 70–99)
Potassium: 3.6 mmol/L (ref 3.5–5.1)
Sodium: 139 mmol/L (ref 135–145)
Total Bilirubin: 2.9 mg/dL — ABNORMAL HIGH (ref 0.3–1.2)
Total Protein: 4.6 g/dL — ABNORMAL LOW (ref 6.5–8.1)

## 2019-02-07 LAB — PROTIME-INR
INR: 1.2 (ref 0.8–1.2)
Prothrombin Time: 15.4 seconds — ABNORMAL HIGH (ref 11.4–15.2)

## 2019-02-07 LAB — PHOSPHORUS: Phosphorus: 3 mg/dL (ref 2.5–4.6)

## 2019-02-07 LAB — MAGNESIUM: Magnesium: 2.4 mg/dL (ref 1.7–2.4)

## 2019-02-07 MED ORDER — POTASSIUM CHLORIDE 10 MEQ/50ML IV SOLN
10.0000 meq | INTRAVENOUS | Status: AC
  Administered 2019-02-07 (×3): 10 meq via INTRAVENOUS
  Filled 2019-02-07 (×3): qty 50

## 2019-02-07 MED ORDER — B COMPLEX-C PO TABS
1.0000 | ORAL_TABLET | Freq: Every day | ORAL | Status: DC
  Administered 2019-02-07 – 2019-02-10 (×4): 1 via ORAL
  Filled 2019-02-07 (×4): qty 1

## 2019-02-07 MED ORDER — ALBUMIN HUMAN 5 % IV SOLN
12.5000 g | Freq: Once | INTRAVENOUS | Status: AC
  Administered 2019-02-07: 12.5 g via INTRAVENOUS
  Filled 2019-02-07: qty 250

## 2019-02-07 MED ORDER — VITAL HIGH PROTEIN PO LIQD
1000.0000 mL | ORAL | Status: DC
  Administered 2019-02-07: 1000 mL

## 2019-02-07 NOTE — Progress Notes (Signed)
Subjective:  Patient seen and examined at bedside. Remains sedated, paralyzed on ventilator. Currently on 5 pressors. Tolerating CRRT at 170 cc/hr.   Objective Vital signs in last 24 hours: Vitals:   02/07/19 0645 02/07/19 0700 02/07/19 0800 02/07/19 0900  BP: (!) 121/46 (!) 118/48 (!) 122/47 (!) 116/50  Pulse: 87 88 88 89  Resp: _0 Temp: (!) 97.3 F (36.3 C) (!) 97.5 F (36.4 C) (!) 97.5 F (36.4 C) 97.9 F (36.6 C)  TempSrc:      SpO2: 100% 100% 100% 100%  Weight:      Height:       Weight change: -9.2 kg  Intake/Output Summary (Last 24 hours) at 02/07/2019 1030 Last data filed at 02/07/2019 1000 Gross per 24 hour  Intake 4098.35 ml  Output 6017 ml  Net -1918.65 ml    Assessment/ Plan: 73 year old white female with longstanding diabetes mellitus with complications, likely diabetic nephropathy, diffuse vascular disease now presenting with unstable angina/acute coronary syndrome status post CABG on 01/25/2019.  Assessment/Plan: 1.  Chronic kidney disease stage IV.  Her baseline serum creatinine is in the low twos.  Likely secondary to diabetic nephropathy.  2. AKI - ATN in the setting of cardiogenic shock. She has had a total of 22.6 L removed. Still 2 kg above admission weight. Remains anuric. Elytes stable. Continue CRRT at current settings.   3. Acute hypoxemic respiratory failure. Likely 2/2 volume overload. Remains intubated. Continue volume removal with CRRT  4. Cardiac/pericardial tamponade post CABG. Improved with sternal opening. Hopeful for closure tomorrow.    5.  Metabolic alkalosis.  Likely secondary to blood product resuscitation and hypokalemia.  Improved with CRRT.  6.  Hypokalemia.  Improved with CRRT.   Delice Bison  PGY-2  Labs: Basic Metabolic Panel: Recent Labs  Lab 02/06/19 0416 02/06/19 1545 02/07/19 0115 02/07/19 0321  NA 138 135 137 139  K 4.7 3.9 3.6 3.6  CL 105 103  --  106  CO2 23 23  --  23  GLUCOSE 215* 164*   --  151*  BUN 16 18  --  16  CREATININE 1.60* 1.45*  --  1.31*  CALCIUM 7.5* 7.3*  --  7.3*  PHOS 4.3 3.2  --  3.0   Liver Function Tests: Recent Labs  Lab 02/04/19 0750  02/04/19 2010  02/06/19 0416 02/06/19 1545 02/07/19 0321  AST 1,763*  --  5,358*  --   --   --  976*  ALT 1,425*  --  2,916*  --   --   --  1,760*  ALKPHOS 32*  --  46  --   --   --  81  BILITOT 2.6*  --  2.6*  --   --   --  2.9*  PROT 4.2*  --  4.4*  --   --   --  4.6*  ALBUMIN 2.8*   < > 2.7*   < > 2.6* 2.4* 2.2*   < > = values in this interval not displayed.   No results for input(s): LIPASE, AMYLASE in the last 168 hours. No results for input(s): AMMONIA in the last 168 hours. CBC: Recent Labs  Lab 02/04/19 0616  02/04/19 1640  02/05/19 0339  02/06/19 0416 02/07/19 0115 02/07/19 0321  WBC 7.3  --  10.8*  --  9.1  --  12.9*  --  19.8*  NEUTROABS 5.6  --  7.3  --   --   --  10.9*  --  16.8*  HGB 9.8*   < > 8.6*   < > 10.8*   < > 10.1* 10.2* 10.1*  HCT 28.9*   < > 24.1*   < > 30.9*   < > 30.6* 30.0* 30.5*  MCV 91.5  --  87.0  --  88.3  --  93.3  --  93.6  PLT 99*  --  119*  --  125*  --  94*  --  89*   < > = values in this interval not displayed.   Cardiac Enzymes: No results for input(s): CKTOTAL, CKMB, CKMBINDEX, TROPONINI in the last 168 hours. CBG: Recent Labs  Lab 02/06/19 1223 02/06/19 1544 02/06/19 1926 02/06/19 2327 02/07/19 0845  GLUCAP 156* 149* 133* 139* 121*    Iron Studies: No results for input(s): IRON, TIBC, TRANSFERRIN, FERRITIN in the last 72 hours. Studies/Results: Dg Chest Port 1 View  Result Date: 02/07/2019 CLINICAL DATA:  Endotracheal tube placement. Chest tube. Postop heart surgery. EXAM: PORTABLE CHEST 1 VIEW COMPARISON:  02/05/2019 FINDINGS: Enteric tube courses into the stomach and off the film as tip is not visualized. Endotracheal tube and right IJ Swan-Ganz catheter unchanged. Bilateral chest tubes and mediastinal drain unchanged. Lungs are adequately inflated  with hazy bibasilar 10 UA shin likely small layering effusions with associated atelectasis. No evidence of pneumothorax. Cardiomediastinal silhouette and remainder of the exam is unchanged. IMPRESSION: Stable hazy bibasilar opacification likely small layering effusions with atelectasis. Tubes and lines unchanged. Electronically Signed   By: Marin Olp M.D.   On: 02/07/2019 07:32   Dg Abd Portable 1v  Result Date: 02/06/2019 CLINICAL DATA:  Enteric tube placement EXAM: PORTABLE ABDOMEN - 1 VIEW COMPARISON:  03/08/2017 CT abdomen/pelvis FINDINGS: Enteric tube tip in distal stomach. Relatively gasless abdomen. No evidence of pneumatosis or pneumoperitoneum. IMPRESSION: 1. Enteric tube tip in the distal stomach. 2. Relatively gasless abdomen, nonspecific. No evidence of pneumatosis or pneumoperitoneum. Electronically Signed   By: Ilona Sorrel M.D.   On: 02/06/2019 14:02   Medications: Infusions: .  prismasol BGK 4/2.5 200 mL/hr at 02/06/19 1902  .  prismasol BGK 4/2.5 200 mL/hr at 02/06/19 1951  . sodium chloride    . sodium chloride    . sodium chloride 10 mL/hr at 02/07/19 0800  . ceFEPime (MAXIPIME) IV 200 mL/hr at 02/07/19 1000  . cisatracurium (NIMBEX) infusion 1.8537 mcg/kg/min (02/07/19 1000)  . DOBUTamine 2.5 mcg/kg/min (02/07/19 1000)  . epinephrine 5 mcg/min (02/07/19 1000)  . fentaNYL infusion INTRAVENOUS 50 mcg/hr (02/07/19 1000)  . insulin Stopped (02/05/19 1229)  . lactated ringers    . lactated ringers    . lactated ringers 10 mL/hr at 02/07/19 1000  . lactated ringers 10 mL/hr at 02/07/19 1000  . midazolam 4 mg/hr (02/07/19 1000)  . milrinone 0.2513 mcg/kg/min (02/07/19 1000)  . nitroGLYCERIN Stopped (02/04/19 1239)  . norepinephrine (LEVOPHED) Adult infusion 16 mcg/min (02/07/19 1000)  . phenylephrine (NEO-SYNEPHRINE) Adult infusion    . prismasol BGK 2/2.5 dialysis solution 2,000 mL/hr at 02/07/19 0858  . vancomycin    . vasopressin (PITRESSIN) infusion - *FOR SHOCK*  0.04 Units/min (02/07/19 1000)    Scheduled Medications: . sodium chloride   Intravenous Once  . sodium chloride   Intravenous Once  . sodium chloride   Intravenous Once  . sodium chloride   Intravenous Once  . sodium chloride   Intravenous Once  . acetaminophen  1,000 mg Oral Q6H   Or  . acetaminophen (TYLENOL) oral  liquid 160 mg/5 mL  1,000 mg Per Tube Q6H  . artificial tears  1 application Both Eyes C9P  . aspirin EC  325 mg Oral Daily   Or  . aspirin  324 mg Per Tube Daily  . bisacodyl  10 mg Oral Daily   Or  . bisacodyl  10 mg Rectal Daily  . chlorhexidine gluconate (MEDLINE KIT)  15 mL Mouth Rinse BID  . Chlorhexidine Gluconate Cloth  6 each Topical Daily  . darbepoetin (ARANESP) injection - NON-DIALYSIS  60 mcg Subcutaneous Q Wed-1800  . docusate  200 mg Per Tube Daily  . feeding supplement (NEPRO CARB STEADY)  1,000 mL Per Tube Q24H  . fentaNYL (SUBLIMAZE) injection  25 mcg Intravenous Once  . insulin aspart  2-6 Units Subcutaneous Q4H  . insulin detemir  8 Units Subcutaneous Q12H  . mouth rinse  15 mL Mouth Rinse 10 times per day  . pantoprazole (PROTONIX) IV  40 mg Intravenous Q24H  . sodium chloride flush  10-40 mL Intracatheter Q12H  . sodium chloride flush  3 mL Intravenous Q12H    have reviewed scheduled and prn medications.  Physical Exam: General: intubated, sedated  Heart: RRR, open chest  Lungs: decreased bilaterally but clear Abdomen: soft, non-distended Extremities: 2+ pitting edema BLE     02/07/2019,10:30 AM  LOS: 7 days

## 2019-02-07 NOTE — Procedures (Signed)
I evaluated the patient while on CRRT and discussed with bedside RN.  Dialysis is running without issue. 

## 2019-02-07 NOTE — Progress Notes (Signed)
Nutrition Follow-up  DOCUMENTATION CODES:   Obesity unspecified  INTERVENTION:   Change tube feeding: - Vital High Protein @ 20 ml/hr via OGT - Increase per toleration to goal rate of 60 ml/hr (1431ml) - Add B complex with Vitamin C  At goal tube feeding regimen provides 1440 kcal, 126 grams of protein, and 1204 ml free water. Meets 105% of kcal needs and 100% of protein needs.   NUTRITION DIAGNOSIS:   Inadequate oral intake related to inability to eat as evidenced by NPO status.  Ongoing  GOAL:   Provide needs based on ASPEN/SCCM guidelines   Addressed via TF   MONITOR:   Vent status, Labs, Weight trends, Skin, I & O's  REASON FOR ASSESSMENT:   Ventilator    ASSESSMENT:   73 year old female who presented to the ED on 10/19 with chest pain and nausea. PMH of DM, CKD stage III-IV, HTN, TIA, CVA s/p right ICA stenting, anemia, GERD, HLD.   10/21 - s/p left heart cath 10/22 - s/p CABG x 3 10/23 - s/p chest exploration, start CRRT  Pt discussed during ICU rounds and with RN.   Remains sedated/paralyzed. Requiring multiple pressors. CRRT pulling 170 ml/hr (continues to be anuric). Chest remains open, plan for possible closure tomorrow. Nepro running at 20 ml/hr without complication. Will speak with TCTS to change formula to peptide based as Nepro is higher in lipid (risk for ileus). Electrolytes WDL and Cr improving. Cortrak planned for tomorrow.   Admission weight: 81.6 kg  Current weight: 88.3 kg (down from 102.2 kg on 10/23)  Patient remains intubated on ventilator support MV: 7.2 L/min Temp (24hrs), Avg:97.5 F (36.4 C), Min:96.6 F (35.9 C), Max:99 F (37.2 C)    I/O: -822 ml since admit UOP: anuric CRRT: 6,218 ml x 24 hrs  Chest tubes: 80 ml x 24 hrs    Drips: nimbex, dobutrex, epinephrine, LR @ 75 ml/hr, milrinone, levophed, vasopressin Medications: dulcolax, aranesp, colace, SS novolog, levemir Labs: LFTs elevated corrected calcium 8.7 (L) Cr  1.31-trending down  Diet Order:   Diet Order    None      EDUCATION NEEDS:   No education needs have been identified at this time  Skin:  Skin Assessment: Skin Integrity Issues: Skin Integrity Issues: Wound Vac: chest Incisions: left leg  Last BM:  PTA  Height:   Ht Readings from Last 1 Encounters:  01/23/2019 5\' 2"  (1.575 m)    Weight:   Wt Readings from Last 1 Encounters:  02/07/19 88.3 kg    Ideal Body Weight:  50 kg  BMI:  Body mass index is 35.6 kg/m.  Estimated Nutritional Needs:   Kcal:  1368 kcal (PSU)  Protein:  100-125 grams  Fluid:  >/= 1.2 L/day   Mariana Single RD, LDN Clinical Nutrition Pager # - [phone]

## 2019-02-07 NOTE — Addendum Note (Signed)
Addendum  created 02/07/19 1048 by Josephine Igo, CRNA   Order list changed

## 2019-02-07 NOTE — Progress Notes (Signed)
EVENING ROUNDS NOTE :     [address]       [place] [address]             [phone]                 4 Days Post-Op Procedure(s) (LRB): CORONARY ARTERY BYPASS GRAFTING (CABG) times three. Free IMA to LAD. Endoscopic harvesting of left greater saphenous vein to OM1 and Posterior Lateral (PLB). (N/A) TRANSESOPHAGEAL ECHOCARDIOGRAM (TEE) (N/A)   Total Length of Stay:  LOS: 7 days  Events:  No events Pulling fluid    BP (!) 99/44   Pulse 91   Temp (!) 97.2 F (36.2 C)   Resp 18   Ht 5\' 2"  (1.575 m)   Wt 88.3 kg   SpO2 100%   BMI 35.60 kg/m   PAP: (18-35)/(11-17) 18/11 CVP:  [2 mmHg-14 mmHg] 8 mmHg CO:  [3 L/min-4 L/min] 4 L/min CI:  [1.7 L/min/m2-2.2 L/min/m2] 2.2 L/min/m2  Vent Mode: SIMV;PSV;PRVC FiO2 (%):  [60 %-80 %] 60 % Set Rate:  [18 bmp] 18 bmp Vt Set:  [450 mL] 450 mL PEEP:  [5 cmH20] 5 cmH20 Pressure Support:  [10 cmH20] 10 cmH20 Plateau Pressure:  [17 cmH20-18 cmH20] 18 cmH20  .  prismasol BGK 4/2.5 200 mL/hr at 02/06/19 1902  .  prismasol BGK 4/2.5 200 mL/hr at 02/06/19 1951  . sodium chloride    . sodium chloride    . sodium chloride 10 mL/hr at 02/07/19 1600  . ceFEPime (MAXIPIME) IV Stopped (02/07/19 1012)  . cisatracurium (NIMBEX) infusion 2 mcg/kg/min (02/07/19 1700)  . DOBUTamine 2.5 mcg/kg/min (02/07/19 1700)  . epinephrine 5 mcg/min (02/07/19 1700)  . fentaNYL infusion INTRAVENOUS 50 mcg/hr (02/07/19 1700)  . insulin Stopped (02/05/19 1229)  . lactated ringers    . lactated ringers    . lactated ringers 10 mL/hr at 02/07/19 1700  . lactated ringers 10 mL/hr at 02/07/19 1700  . midazolam 3 mg/hr (02/07/19 1700)  . milrinone 0.2513 mcg/kg/min (02/07/19 1700)  . nitroGLYCERIN Stopped (02/04/19 1239)  . norepinephrine (LEVOPHED) Adult infusion 20 mcg/min (02/07/19 1700)  . phenylephrine (NEO-SYNEPHRINE) Adult infusion    . prismasol BGK 2/2.5 dialysis solution 2,000 mL/hr at 02/07/19 1505  . vancomycin    . vasopressin  (PITRESSIN) infusion - *FOR SHOCK* 0.04 Units/min (02/07/19 1700)    I/O last 3 completed shifts: In: 4534.6 [I.V.:3466.2; NG/GT:368.3; IV Piggyback:700.1] Out: 9613 [Emesis/NG output:50; WYOVZ:8588; Chest Tube:160]   CBC Latest Ref Rng & Units 02/07/2019 02/07/2019 02/06/2019  WBC 4.0 - 10.5 K/uL 19.8(H) - 12.9(H)  Hemoglobin 12.0 - 15.0 g/dL 10.1(L) 10.2(L) 10.1(L)  Hematocrit 36.0 - 46.0 % 30.5(L) 30.0(L) 30.6(L)  Platelets 150 - 400 K/uL 89(L) - 94(L)    BMP Latest Ref Rng & Units 02/07/2019 02/07/2019 02/07/2019  Glucose 70 - 99 mg/dL 166(H) 151(H) -  BUN 8 - 23 mg/dL 16 16 -  Creatinine 0.44 - 1.00 mg/dL 1.24(H) 1.31(H) -  BUN/Creat Ratio 12 - 28 - - -  Sodium 135 - 145 mmol/L 136 139 137  Potassium 3.5 - 5.1 mmol/L 3.6 3.6 3.6  Chloride 98 - 111 mmol/L 102 106 -  CO2 22 - 32 mmol/L 23 23 -  Calcium 8.9 - 10.3 mg/dL 7.4(L) 7.3(L) -    ABG    Component Value Date/Time   PHART 7.397 02/07/2019 0115   PCO2ART 37.4 02/07/2019 0115   PO2ART 100.0 02/07/2019 0115   HCO3 23.1 02/07/2019 0115  TCO2 24 02/07/2019 0115   ACIDBASEDEF 2.0 02/07/2019 0115   O2SAT 61.4 02/07/2019 0440       Melodie Bouillon, MD 02/07/2019 5:23 PM

## 2019-02-07 NOTE — Progress Notes (Addendum)
Advanced Heart Failure Rounding Note   Subjective:    Remains intubated/sedated/paralyzed. FiO2 increased to 70%.  On CVVHD pulling -200/hr. On epi 5, dobutamine 2.5, milrinone 0.25 mcg , VP 0.04, and NE 16.   Blood CX 10/24 NG   On CVVHD. Weight down another 5 pounds.   Co-ox 61%   Swan  CVP 7 PA  21/14 (18)  Thermo 3.6/2    Objective:   Weight Range:  Vital Signs:   Temp:  [96.6 F (35.9 C)-98.2 F (36.8 C)] 97.5 F (36.4 C) (10/26 0700) Pulse Rate:  [84-89] 88 (10/26 0700) Resp:  [16-24] 18 (10/26 0700) BP: (63-132)/(30-60) 118/48 (10/26 0700) SpO2:  [96 %-100 %] 100 % (10/26 0700) FiO2 (%):  [70 %-80 %] 70 % (10/26 0545) Weight:  [88.3 kg] 88.3 kg (10/26 0600) Last BM Date: (PTA)  Weight change: Filed Weights   02/05/19 0800 02/06/19 0430 02/07/19 0600  Weight: 98 kg 90.6 kg 88.3 kg    Intake/Output:   Intake/Output Summary (Last 24 hours) at 02/07/2019 0745 Last data filed at 02/07/2019 0700 Gross per 24 hour  Intake 3388.36 ml  Output 6298 ml  Net -2909.64 ml     Physical Exam: CVP 7-8 on CVVHD  General:  Intubated/sedated HEENT: normal Neck: supple. JVP 7-8 . Carotids 2+ bilat; no bruits. No lymphadenopathy or thryomegaly appreciated. Cor: PMI nondisplaced. Regular rate & rhythm. No rubs, gallops or murmurs. VAC in place Lungs: clear Abdomen: soft, nontender, nondistended. No hepatosplenomegaly. No bruits or masses. Good bowel sounds. Extremities: warm, no cyanosis, clubbing, rash, edema. R leg with some mottling noted.  Neuro: intubated/sedated   Telemetry: SR 80s personally reviewed.   .  Labs: Basic Metabolic Panel: Recent Labs  Lab 02/04/19 0204  02/04/19 1825  02/05/19 7672  02/05/19 1559 02/06/19 0358 02/06/19 0416 02/06/19 1545 02/07/19 0115 02/07/19 0321  NA 139   < >  --    < > 141   < > 138 136 138 135 137 139  K 4.4   < >  --    < > 4.0   < > 5.2* 4.6 4.7 3.9 3.6 3.6  CL 106   < >  --    < > 106  --  107  --  105  103  --  106  CO2 22   < >  --    < > 24  --  22  --  23 23  --  23  GLUCOSE 171*   < >  --    < > 100*  --  162*  --  215* 164*  --  151*  BUN 20   < >  --    < > 13  --  12  --  16 18  --  16  CREATININE 2.30*   < >  --    < > 1.68*  --  1.44*  --  1.60* 1.45*  --  1.31*  CALCIUM 7.0*   < >  --    < > 7.8*  --  7.4*  --  7.5* 7.3*  --  7.3*  MG 1.4*  --  1.7  --  2.0  --   --   --  2.3  --   --  2.4  PHOS  --    < >  --   --  2.9  --  3.6  --  4.3 3.2  --  3.0   < > =  values in this interval not displayed.    Liver Function Tests: Recent Labs  Lab 01/24/2019 1028 01/14/2019 1402  02/04/19 0750  02/04/19 2010 02/05/19 0339 02/05/19 1559 02/06/19 0416 02/06/19 1545 02/07/19 0321  AST 11 15  --  1,763*  --  5,358*  --   --   --   --  976*  ALT 10 13  --  1,425*  --  2,916*  --   --   --   --  1,760*  ALKPHOS 109 82  --  32*  --  46  --   --   --   --  81  BILITOT <0.2 0.5  --  2.6*  --  2.6*  --   --   --   --  2.9*  PROT 6.0 6.5  --  4.2*  --  4.4*  --   --   --   --  4.6*  ALBUMIN 3.7 3.3*   < > 2.8*   < > 2.7* 2.8* 2.8* 2.6* 2.4* 2.2*   < > = values in this interval not displayed.   No results for input(s): LIPASE, AMYLASE in the last 168 hours. No results for input(s): AMMONIA in the last 168 hours.  CBC: Recent Labs  Lab 02/06/2019 1402  02/04/19 0616  02/04/19 1640  02/05/19 0339  02/05/19 1332 02/06/19 0358 02/06/19 0416 02/07/19 0115 02/07/19 0321  WBC 10.8*   < > 7.3  --  10.8*  --  9.1  --   --   --  12.9*  --  19.8*  NEUTROABS 8.0*  --  5.6  --  7.3  --   --   --   --   --  10.9*  --  16.8*  HGB 11.2*   < > 9.8*   < > 8.6*   < > 10.8*   < > 10.4* 9.9* 10.1* 10.2* 10.1*  HCT 35.9*   < > 28.9*   < > 24.1*   < > 30.9*   < > 30.3* 29.0* 30.6* 30.0* 30.5*  MCV 88.4   < > 91.5  --  87.0  --  88.3  --   --   --  93.3  --  93.6  PLT 263   < > 99*  --  119*  --  125*  --   --   --  94*  --  89*   < > = values in this interval not displayed.    Cardiac Enzymes: No  results for input(s): CKTOTAL, CKMB, CKMBINDEX, TROPONINI in the last 168 hours.  BNP: BNP (last 3 results) No results for input(s): BNP in the last 8760 hours.  ProBNP (last 3 results) No results for input(s): PROBNP in the last 8760 hours.    Other results:  Imaging: Dg Chest Port 1 View  Result Date: 02/07/2019 CLINICAL DATA:  Endotracheal tube placement. Chest tube. Postop heart surgery. EXAM: PORTABLE CHEST 1 VIEW COMPARISON:  02/05/2019 FINDINGS: Enteric tube courses into the stomach and off the film as tip is not visualized. Endotracheal tube and right IJ Swan-Ganz catheter unchanged. Bilateral chest tubes and mediastinal drain unchanged. Lungs are adequately inflated with hazy bibasilar 10 UA shin likely small layering effusions with associated atelectasis. No evidence of pneumothorax. Cardiomediastinal silhouette and remainder of the exam is unchanged. IMPRESSION: Stable hazy bibasilar opacification likely small layering effusions with atelectasis. Tubes and lines unchanged. Electronically Signed   By: Marin Olp M.D.  On: 02/07/2019 07:32   Dg Abd Portable 1v  Result Date: 02/06/2019 CLINICAL DATA:  Enteric tube placement EXAM: PORTABLE ABDOMEN - 1 VIEW COMPARISON:  03/08/2017 CT abdomen/pelvis FINDINGS: Enteric tube tip in distal stomach. Relatively gasless abdomen. No evidence of pneumatosis or pneumoperitoneum. IMPRESSION: 1. Enteric tube tip in the distal stomach. 2. Relatively gasless abdomen, nonspecific. No evidence of pneumatosis or pneumoperitoneum. Electronically Signed   By: Ilona Sorrel M.D.   On: 02/06/2019 14:02     Medications:     Scheduled Medications: . sodium chloride   Intravenous Once  . sodium chloride   Intravenous Once  . sodium chloride   Intravenous Once  . sodium chloride   Intravenous Once  . sodium chloride   Intravenous Once  . acetaminophen  1,000 mg Oral Q6H   Or  . acetaminophen (TYLENOL) oral liquid 160 mg/5 mL  1,000 mg Per Tube  Q6H  . artificial tears  1 application Both Eyes F8B  . aspirin EC  325 mg Oral Daily   Or  . aspirin  324 mg Per Tube Daily  . bisacodyl  10 mg Oral Daily   Or  . bisacodyl  10 mg Rectal Daily  . chlorhexidine gluconate (MEDLINE KIT)  15 mL Mouth Rinse BID  . Chlorhexidine Gluconate Cloth  6 each Topical Daily  . darbepoetin (ARANESP) injection - NON-DIALYSIS  60 mcg Subcutaneous Q Wed-1800  . docusate  200 mg Per Tube Daily  . feeding supplement (NEPRO CARB STEADY)  1,000 mL Per Tube Q24H  . fentaNYL (SUBLIMAZE) injection  25 mcg Intravenous Once  . insulin aspart  2-6 Units Subcutaneous Q4H  . insulin detemir  8 Units Subcutaneous Q12H  . mouth rinse  15 mL Mouth Rinse 10 times per day  . pantoprazole (PROTONIX) IV  40 mg Intravenous Q24H  . sodium chloride flush  10-40 mL Intracatheter Q12H  . sodium chloride flush  3 mL Intravenous Q12H    Infusions: .  prismasol BGK 4/2.5 200 mL/hr at 02/06/19 1902  .  prismasol BGK 4/2.5 200 mL/hr at 02/06/19 1951  . sodium chloride    . sodium chloride    . sodium chloride 10 mL/hr at 02/06/19 0759  . ceFEPime (MAXIPIME) IV Stopped (02/06/19 2134)  . cisatracurium (NIMBEX) infusion 1.8537 mcg/kg/min (02/07/19 0700)  . DOBUTamine 2.5 mcg/kg/min (02/07/19 0700)  . epinephrine 5 mcg/min (02/07/19 0701)  . fentaNYL infusion INTRAVENOUS 50 mcg/hr (02/07/19 0700)  . insulin Stopped (02/05/19 1229)  . lactated ringers    . lactated ringers    . lactated ringers 10 mL/hr at 02/07/19 0700  . lactated ringers 10 mL/hr at 02/07/19 0700  . midazolam 4 mg/hr (02/07/19 0700)  . milrinone 0.2513 mcg/kg/min (02/07/19 0700)  . nitroGLYCERIN Stopped (02/04/19 1239)  . norepinephrine (LEVOPHED) Adult infusion 16 mcg/min (02/07/19 0700)  . phenylephrine (NEO-SYNEPHRINE) Adult infusion    . prismasol BGK 2/2.5 dialysis solution 2,000 mL/hr at 02/07/19 0617  . vancomycin    . vasopressin (PITRESSIN) infusion - *FOR SHOCK* 0.04 Units/min (02/07/19 0700)     PRN Medications: sodium chloride, fentaNYL, heparin, lactated ringers, metoprolol tartrate, midazolam, morphine injection, ondansetron (ZOFRAN) IV, oxyCODONE, sodium chloride, sodium chloride flush, sodium chloride flush   Assessment/Plan:    1. Post-cardiotomy cardiogenic shock  - - intra-op TEE 50-55%. Open chest with closure possible tomorrow - remains critically ill - co-ox 61% on epi 5, NE 16, VP 0.04, milrinone 0.25, and dobutamine 2.5 mcg.  - suspect RV failure  playing significant role. continue inotrope support.  - TEE 50-55% RV mod HK with mild external compression.  - continue fluid removal with CVVHD  2. Acute post-op respiratory failure - on FiO2 70 currently.  - continue fluid removal and diuresis.   3. CAD with NSTEMI - cath with 99% distal LM -  s/p CABG 10/22 - continue ASA/statin - no b-blocker with shock  4. AKI - due to shock/ATN - continue CVVHD - No urine output  5. Shock liver - transaminases way up due to ischemic injury  - continue supportive care.  -Trending down.  6. Coagulopathy and ABL anemia - in setting of DAPT - replace products as needed - bleeding seems to have slowed. Hgb 10.1. PLTs 125-> 94>89 k. Need to follow closely.   7. ID  Blood Cx 10/24 no growth  WBC trending up 12>19.8  - On vanc and cefepime.    Length of Stay: 7   Amy Clegg NP-C  02/07/2019, 7:45 AM  Advanced Heart Failure Team Pager [phone] (M-F; 7a - 4p)  Please contact [facility] for night-coverage after hours (4p -7a ) and weekends on [email or web address]  Agree with above  Remains critically ill on vent and CVVHD. Chest open. Requiring multiple pressors. Weight down 5 pounds overnight but still about 10 pounds above baseline.   TEE yesterday at bedside LVEF 50-55% RV moderately HK with mild external compression  General:  Intubated/sedated/paralyzed on vent HEENT: normal + ETT Neck: supple. + swan. Carotids 2+ bilat; no bruits. No lymphadenopathy  or thryomegaly appreciated. Cor: chest open with spacer Lungs: coarse Abdomen: obese soft, nontender, nondistended. No hepatosplenomegaly. No bruits or masses. Hypoactive bowel sounds. Extremities: no cyanosis, clubbing, rash, 2+ edema + diffuse ecchymosis RFV dialysis cath Neuro: intubated sedated paralyzed  She is critically ill. On multiple pressors and CVVHD. Volume status improving but still up. CXR with bilateral effusions. Continue CVVHD. WBC rising. Abx started on 10/15. Follow cx. Will send sputum cx today. Possible chest closure tomorrow.   CRITICAL CARE Performed by: Glori Bickers  Total critical care time: 45 minutes  Critical care time was exclusive of separately billable procedures and treating other patients.  Critical care was necessary to treat or prevent imminent or life-threatening deterioration.  Critical care was time spent personally by me (independent of midlevel providers or residents) on the following activities: development of treatment plan with patient and/or surrogate as well as nursing, discussions with consultants, evaluation of patient's response to treatment, examination of patient, obtaining history from patient or surrogate, ordering and performing treatments and interventions, ordering and review of laboratory studies, ordering and review of radiographic studies, pulse oximetry and re-evaluation of patient's condition.  Glori Bickers, MD  9:09 AM

## 2019-02-08 ENCOUNTER — Inpatient Hospital Stay (HOSPITAL_COMMUNITY): Payer: Medicare Other | Admitting: Anesthesiology

## 2019-02-08 ENCOUNTER — Inpatient Hospital Stay (HOSPITAL_COMMUNITY): Payer: Medicare Other

## 2019-02-08 ENCOUNTER — Encounter (HOSPITAL_COMMUNITY): Admission: EM | Disposition: E | Payer: Self-pay | Source: Home / Self Care | Attending: Cardiothoracic Surgery

## 2019-02-08 HISTORY — PX: TRACHEOSTOMY TUBE PLACEMENT: SHX814

## 2019-02-08 HISTORY — PX: STERNAL CLOSURE: SHX6203

## 2019-02-08 LAB — CBC
HCT: 32 % — ABNORMAL LOW (ref 36.0–46.0)
Hemoglobin: 10.4 g/dL — ABNORMAL LOW (ref 12.0–15.0)
MCH: 31 pg (ref 26.0–34.0)
MCHC: 32.5 g/dL (ref 30.0–36.0)
MCV: 95.2 fL (ref 80.0–100.0)
Platelets: 66 10*3/uL — ABNORMAL LOW (ref 150–400)
RBC: 3.36 MIL/uL — ABNORMAL LOW (ref 3.87–5.11)
RDW: 15.9 % — ABNORMAL HIGH (ref 11.5–15.5)
WBC: 19.4 10*3/uL — ABNORMAL HIGH (ref 4.0–10.5)
nRBC: 0.9 % — ABNORMAL HIGH (ref 0.0–0.2)

## 2019-02-08 LAB — CBC WITH DIFFERENTIAL/PLATELET
Abs Immature Granulocytes: 0 10*3/uL (ref 0.00–0.07)
Basophils Absolute: 0 10*3/uL (ref 0.0–0.1)
Basophils Relative: 0 %
Eosinophils Absolute: 0 10*3/uL (ref 0.0–0.5)
Eosinophils Relative: 0 %
HCT: 24.2 % — ABNORMAL LOW (ref 36.0–46.0)
Hemoglobin: 7.9 g/dL — ABNORMAL LOW (ref 12.0–15.0)
Lymphocytes Relative: 18 %
Lymphs Abs: 2.3 10*3/uL (ref 0.7–4.0)
MCH: 31.6 pg (ref 26.0–34.0)
MCHC: 32.6 g/dL (ref 30.0–36.0)
MCV: 96.8 fL (ref 80.0–100.0)
Monocytes Absolute: 0.3 10*3/uL (ref 0.1–1.0)
Monocytes Relative: 2 %
Neutro Abs: 10.2 10*3/uL — ABNORMAL HIGH (ref 1.7–7.7)
Neutrophils Relative %: 80 %
Platelets: 46 10*3/uL — ABNORMAL LOW (ref 150–400)
RBC: 2.5 MIL/uL — ABNORMAL LOW (ref 3.87–5.11)
RDW: 16 % — ABNORMAL HIGH (ref 11.5–15.5)
WBC: 12.7 10*3/uL — ABNORMAL HIGH (ref 4.0–10.5)
nRBC: 1.3 % — ABNORMAL HIGH (ref 0.0–0.2)
nRBC: 4 /100 WBC — ABNORMAL HIGH

## 2019-02-08 LAB — RENAL FUNCTION PANEL
Albumin: 2.4 g/dL — ABNORMAL LOW (ref 3.5–5.0)
Albumin: 2.4 g/dL — ABNORMAL LOW (ref 3.5–5.0)
Anion gap: 8 (ref 5–15)
Anion gap: 9 (ref 5–15)
BUN: 18 mg/dL (ref 8–23)
BUN: 25 mg/dL — ABNORMAL HIGH (ref 8–23)
CO2: 23 mmol/L (ref 22–32)
CO2: 20 mmol/L — ABNORMAL LOW (ref 22–32)
Calcium: 7.6 mg/dL — ABNORMAL LOW (ref 8.9–10.3)
Calcium: 8.4 mg/dL — ABNORMAL LOW (ref 8.9–10.3)
Chloride: 104 mmol/L (ref 98–111)
Chloride: 105 mmol/L (ref 98–111)
Creatinine, Ser: 1.26 mg/dL — ABNORMAL HIGH (ref 0.44–1.00)
Creatinine, Ser: 1.68 mg/dL — ABNORMAL HIGH (ref 0.44–1.00)
GFR calc Af Amer: 49 mL/min — ABNORMAL LOW (ref >60)
GFR calc Af Amer: 35 mL/min — ABNORMAL LOW (ref >60)
GFR calc non Af Amer: 42 mL/min — ABNORMAL LOW (ref >60)
GFR calc non Af Amer: 30 mL/min — ABNORMAL LOW (ref >60)
Glucose, Bld: 187 mg/dL — ABNORMAL HIGH (ref 70–99)
Glucose, Bld: 178 mg/dL — ABNORMAL HIGH (ref 70–99)
Phosphorus: 2.7 mg/dL (ref 2.5–4.6)
Phosphorus: 4.1 mg/dL (ref 2.5–4.6)
Potassium: 3.6 mmol/L (ref 3.5–5.1)
Potassium: 3.6 mmol/L (ref 3.5–5.1)
Sodium: 135 mmol/L (ref 135–145)
Sodium: 134 mmol/L — ABNORMAL LOW (ref 135–145)

## 2019-02-08 LAB — POCT I-STAT 7, (LYTES, BLD GAS, ICA,H+H)
Acid-base deficit: 3 mmol/L — ABNORMAL HIGH (ref 0.0–2.0)
Acid-base deficit: 5 mmol/L — ABNORMAL HIGH (ref 0.0–2.0)
Bicarbonate: 22.4 mmol/L (ref 20.0–28.0)
Bicarbonate: 21.9 mmol/L (ref 20.0–28.0)
Calcium, Ion: 1.49 mmol/L — ABNORMAL HIGH (ref 1.15–1.40)
Calcium, Ion: 1.36 mmol/L (ref 1.15–1.40)
HCT: 23 % — ABNORMAL LOW (ref 36.0–46.0)
HCT: 23 % — ABNORMAL LOW (ref 36.0–46.0)
Hemoglobin: 7.8 g/dL — ABNORMAL LOW (ref 12.0–15.0)
Hemoglobin: 7.8 g/dL — ABNORMAL LOW (ref 12.0–15.0)
O2 Saturation: 99 %
O2 Saturation: 97 %
Patient temperature: 36
Patient temperature: 35.5
Potassium: 3.8 mmol/L (ref 3.5–5.1)
Potassium: 3.7 mmol/L (ref 3.5–5.1)
Sodium: 138 mmol/L (ref 135–145)
Sodium: 139 mmol/L (ref 135–145)
TCO2: 24 mmol/L (ref 22–32)
TCO2: 23 mmol/L (ref 22–32)
pCO2 arterial: 40.2 mmHg (ref 32.0–48.0)
pCO2 arterial: 44.8 mmHg (ref 32.0–48.0)
pH, Arterial: 7.35 (ref 7.350–7.450)
pH, Arterial: 7.289 — ABNORMAL LOW (ref 7.350–7.450)
pO2, Arterial: 166 mmHg — ABNORMAL HIGH (ref 83.0–108.0)
pO2, Arterial: 98 mmHg (ref 83.0–108.0)

## 2019-02-08 LAB — GLUCOSE, CAPILLARY
Glucose-Capillary: 133 mg/dL — ABNORMAL HIGH (ref 70–99)
Glucose-Capillary: 146 mg/dL — ABNORMAL HIGH (ref 70–99)
Glucose-Capillary: 150 mg/dL — ABNORMAL HIGH (ref 70–99)
Glucose-Capillary: 159 mg/dL — ABNORMAL HIGH (ref 70–99)
Glucose-Capillary: 178 mg/dL — ABNORMAL HIGH (ref 70–99)
Glucose-Capillary: 179 mg/dL — ABNORMAL HIGH (ref 70–99)

## 2019-02-08 LAB — COOXEMETRY PANEL
Carboxyhemoglobin: 1.3 % (ref 0.5–1.5)
Methemoglobin: 1.3 % (ref 0.0–1.5)
O2 Saturation: 56.9 %
Total hemoglobin: 15.8 g/dL (ref 12.0–16.0)

## 2019-02-08 LAB — MAGNESIUM: Magnesium: 2.5 mg/dL — ABNORMAL HIGH (ref 1.7–2.4)

## 2019-02-08 SURGERY — CLOSURE, STERNUM
Anesthesia: General | Site: Neck

## 2019-02-08 MED ORDER — MILRINONE LACTATE IN DEXTROSE 20-5 MG/100ML-% IV SOLN: 0.2500 ug/kg/min | INTRAVENOUS | Status: DC

## 2019-02-08 MED ORDER — PROPOFOL 10 MG/ML IV BOLUS
INTRAVENOUS | Status: AC
  Filled 2019-02-08: qty 20

## 2019-02-08 MED ORDER — VANCOMYCIN HCL 1000 MG IV SOLR
INTRAVENOUS | Status: AC
  Administered 2019-02-08: 14:00:00
  Filled 2019-02-08 (×2): qty 1000

## 2019-02-08 MED ORDER — MAGNESIUM SULFATE 2 GM/50ML IV SOLN
2.0000 g | INTRAVENOUS | Status: AC
  Administered 2019-02-08: 2 g via INTRAVENOUS
  Filled 2019-02-08: qty 50

## 2019-02-08 MED ORDER — VANCOMYCIN HCL IN DEXTROSE 1-5 GM/200ML-% IV SOLN
INTRAVENOUS | Status: AC
  Filled 2019-02-08: qty 200

## 2019-02-08 MED ORDER — ALBUMIN HUMAN 5 % IV SOLN
25.0000 g | Freq: Once | INTRAVENOUS | Status: AC
  Administered 2019-02-08: 25 g via INTRAVENOUS
  Filled 2019-02-08: qty 250

## 2019-02-08 MED ORDER — AMIODARONE IV BOLUS ONLY 150 MG/100ML
INTRAVENOUS | Status: DC | PRN
  Administered 2019-02-08 (×2): 150 mg via INTRAVENOUS

## 2019-02-08 MED ORDER — NITROGLYCERIN IN D5W 200-5 MCG/ML-% IV SOLN
0.0000 ug/min | INTRAVENOUS | Status: DC
  Filled 2019-02-08 (×2): qty 250

## 2019-02-08 MED ORDER — POTASSIUM CHLORIDE 10 MEQ/50ML IV SOLN
10.0000 meq | INTRAVENOUS | Status: AC
  Administered 2019-02-08 (×3): 10 meq via INTRAVENOUS
  Filled 2019-02-08 (×3): qty 50

## 2019-02-08 MED ORDER — FENTANYL CITRATE (PF) 250 MCG/5ML IJ SOLN
INTRAMUSCULAR | Status: AC
  Filled 2019-02-08: qty 5

## 2019-02-08 MED ORDER — DOBUTAMINE IN D5W 4-5 MG/ML-% IV SOLN
2.5000 ug/kg/min | INTRAVENOUS | Status: DC
  Filled 2019-02-08: qty 250

## 2019-02-08 MED ORDER — AMIODARONE HCL IN DEXTROSE 360-4.14 MG/200ML-% IV SOLN
60.0000 mg/h | INTRAVENOUS | Status: AC
  Administered 2019-02-08: 60 mg/h via INTRAVENOUS
  Filled 2019-02-08 (×2): qty 200

## 2019-02-08 MED ORDER — SODIUM CHLORIDE 0.9 % IR SOLN
Status: DC | PRN
  Administered 2019-02-08 (×2): 1000 mL
  Administered 2019-02-08: 3000 mL

## 2019-02-08 MED ORDER — ALBUMIN HUMAN 5 % IV SOLN
INTRAVENOUS | Status: AC
  Administered 2019-02-08: 25 g via INTRAVENOUS
  Filled 2019-02-08: qty 250

## 2019-02-08 MED ORDER — SILVER SULFADIAZINE 1 % EX CREA
TOPICAL_CREAM | Freq: Two times a day (BID) | CUTANEOUS | Status: DC
  Administered 2019-02-08 – 2019-02-09 (×2): via TOPICAL
  Administered 2019-02-09: 1 via TOPICAL
  Administered 2019-02-10: 12:00:00 via TOPICAL
  Filled 2019-02-08: qty 85

## 2019-02-08 MED ORDER — VANCOMYCIN HCL IN DEXTROSE 1-5 GM/200ML-% IV SOLN
1000.0000 mg | INTRAVENOUS | Status: DC
  Administered 2019-02-08 – 2019-02-09 (×2): 1000 mg via INTRAVENOUS
  Filled 2019-02-08 (×2): qty 200

## 2019-02-08 MED ORDER — AMIODARONE HCL IN DEXTROSE 360-4.14 MG/200ML-% IV SOLN
30.0000 mg/h | INTRAVENOUS | Status: DC
  Administered 2019-02-09 – 2019-02-10 (×3): 30 mg/h via INTRAVENOUS
  Filled 2019-02-08 (×4): qty 200

## 2019-02-08 MED ORDER — SODIUM BICARBONATE 8.4 % IV SOLN
INTRAVENOUS | Status: DC | PRN
  Administered 2019-02-08: 50 meq via INTRAVENOUS

## 2019-02-08 MED ORDER — ALBUMIN HUMAN 5 % IV SOLN
INTRAVENOUS | Status: DC | PRN
  Administered 2019-02-08: 14:00:00 via INTRAVENOUS

## 2019-02-08 MED ORDER — ALBUMIN HUMAN 5 % IV SOLN
25.0000 g | Freq: Once | INTRAVENOUS | Status: AC
  Administered 2019-02-08: 19:00:00 25 g via INTRAVENOUS

## 2019-02-08 MED ORDER — AMIODARONE HCL IN DEXTROSE 360-4.14 MG/200ML-% IV SOLN: 60.0000 mg/h | INTRAVENOUS | Status: DC

## 2019-02-08 MED ORDER — SODIUM CHLORIDE 0.9 % IV SOLN
2.0000 g | Freq: Two times a day (BID) | INTRAVENOUS | Status: DC
  Administered 2019-02-09 – 2019-02-10 (×3): 2 g via INTRAVENOUS
  Filled 2019-02-08 (×4): qty 2

## 2019-02-08 MED ORDER — MIDAZOLAM HCL 2 MG/2ML IJ SOLN
INTRAMUSCULAR | Status: AC
  Filled 2019-02-08: qty 2

## 2019-02-08 MED ORDER — AMIODARONE HCL IN DEXTROSE 360-4.14 MG/200ML-% IV SOLN: 30.0000 mg/h | INTRAVENOUS | Status: DC

## 2019-02-08 MED ORDER — CALCIUM CHLORIDE 10 % IV SOLN
INTRAVENOUS | Status: AC
  Filled 2019-02-08: qty 10

## 2019-02-08 MED ORDER — VANCOMYCIN HCL 1000 MG IV SOLR
INTRAVENOUS | Status: AC
  Filled 2019-02-08: qty 1000

## 2019-02-08 MED ORDER — CALCIUM CHLORIDE 10 % IV SOLN
INTRAVENOUS | Status: DC | PRN
  Administered 2019-02-08 (×2): 1 g via INTRAVENOUS

## 2019-02-08 SURGICAL SUPPLY — 83 items
APL PRP STRL LF ISPRP CHG 10.5 (MISCELLANEOUS) ×2
APL SKNCLS STERI-STRIP NONHPOA (GAUZE/BANDAGES/DRESSINGS)
APPLICATOR CHLORAPREP 10.5 ORG (MISCELLANEOUS) ×1 IMPLANT
BAG DECANTER FOR FLEXI CONT (MISCELLANEOUS) ×3 IMPLANT
BATTERY MAXDRIVER (MISCELLANEOUS) ×1 IMPLANT
BENZOIN TINCTURE PRP APPL 2/3 (GAUZE/BANDAGES/DRESSINGS) IMPLANT
BLADE CLIPPER SURG (BLADE) ×3 IMPLANT
BLADE SURG 10 STRL SS (BLADE) ×5 IMPLANT
CABLE PACING FASLOC BLUE (MISCELLANEOUS) ×1 IMPLANT
CANISTER SUCT 3000ML PPV (MISCELLANEOUS) ×3 IMPLANT
CANISTER WOUND CARE 500ML ATS (WOUND CARE) ×3 IMPLANT
CANISTER WOUNDNEG PRESSURE 500 (CANNISTER) ×1 IMPLANT
CLIP VESOCCLUDE SM WIDE 24/CT (CLIP) IMPLANT
CONN ST 1/4X3/8  BEN (MISCELLANEOUS) ×1
CONN ST 1/4X3/8 BEN (MISCELLANEOUS) IMPLANT
CONT SPEC 4OZ CLIKSEAL STRL BL (MISCELLANEOUS) ×1 IMPLANT
COVER PROBE W GEL 5X96 (DRAPES) ×2 IMPLANT
COVER WAND RF STERILE (DRAPES) ×2 IMPLANT
DRAIN CHANNEL 28F RND 3/8 FF (WOUND CARE) ×1 IMPLANT
DRAPE CARDIOVASCULAR INCISE (DRAPES) ×6
DRAPE LAPAROTOMY (DRAPES) ×1 IMPLANT
DRAPE SRG 135.5X100X77XCV INC (DRAPES) IMPLANT
DRAPE SRG 135X102X78XABS (DRAPES) ×2 IMPLANT
DRSG AQUACEL AG ADV 3.5X14 (GAUZE/BANDAGES/DRESSINGS) ×3 IMPLANT
DRSG DRAWTEX TRACH 4X4 (GAUZE/BANDAGES/DRESSINGS) ×1 IMPLANT
DRSG VAC ATS LRG SENSATRAC (GAUZE/BANDAGES/DRESSINGS) ×3 IMPLANT
DRSG VAC ATS MED SENSATRAC (GAUZE/BANDAGES/DRESSINGS) ×2 IMPLANT
DRSG VAC ATS SM SENSATRAC (GAUZE/BANDAGES/DRESSINGS) ×2 IMPLANT
ELECT REM PT RETURN 9FT ADLT (ELECTROSURGICAL) ×3
ELECTRODE REM PT RTRN 9FT ADLT (ELECTROSURGICAL) ×2 IMPLANT
GAUZE SPONGE 4X4 12PLY STRL (GAUZE/BANDAGES/DRESSINGS) ×3 IMPLANT
GAUZE XEROFORM 5X9 LF (GAUZE/BANDAGES/DRESSINGS) IMPLANT
GLOVE BIOGEL PI IND STRL 6 (GLOVE) IMPLANT
GLOVE BIOGEL PI IND STRL 6.5 (GLOVE) IMPLANT
GLOVE BIOGEL PI INDICATOR 6 (GLOVE) ×1
GLOVE BIOGEL PI INDICATOR 6.5 (GLOVE) ×1
GLOVE NEODERM STRL 7.5 LF PF (GLOVE) ×2 IMPLANT
GLOVE SURG NEODERM 7.5  LF PF (GLOVE) ×3
GOWN STRL REUS W/ TWL LRG LVL3 (GOWN DISPOSABLE) ×8 IMPLANT
GOWN STRL REUS W/TWL LRG LVL3 (GOWN DISPOSABLE) ×12
HEMOSTAT POWDER SURGIFOAM 1G (HEMOSTASIS) IMPLANT
HEMOSTAT SURGICEL 2X14 (HEMOSTASIS) IMPLANT
HOLDER TRACH TUBE VELCRO 19.5 (MISCELLANEOUS) ×1 IMPLANT
IV NS 1000ML (IV SOLUTION) ×3
IV NS 1000ML BAXH (IV SOLUTION) IMPLANT
IV NS IRRIG 3000ML ARTHROMATIC (IV SOLUTION) ×1 IMPLANT
KIT BASIN OR (CUSTOM PROCEDURE TRAY) ×3 IMPLANT
KIT TURNOVER KIT B (KITS) ×3 IMPLANT
MARKER SKIN DUAL TIP RULER LAB (MISCELLANEOUS) IMPLANT
NS IRRIG 1000ML POUR BTL (IV SOLUTION) ×3 IMPLANT
PACK CHEST (CUSTOM PROCEDURE TRAY) ×3 IMPLANT
PAD ARMBOARD 7.5X6 YLW CONV (MISCELLANEOUS) ×6 IMPLANT
PLATE STERNAL 2.3X208 14H 2-PK (Plate) ×1 IMPLANT
PUTTY DBX 5CC (Putty) ×1 IMPLANT
SCREW LOCKING TI 2.3X11MM (Screw) ×4 IMPLANT
SCREW LOCKING TI 2.3X13MM (Screw) ×6 IMPLANT
SCREW STERNAL LOCK 2.3MM (Screw) ×2 IMPLANT
SET MICROPUNCTURE 5F STIFF (MISCELLANEOUS) ×1 IMPLANT
SOL PREP POV-IOD 4OZ 10% (MISCELLANEOUS) IMPLANT
SPONGE LAP 18X18 RF (DISPOSABLE) ×1 IMPLANT
SUT ETHILON 3 0 FSL (SUTURE) IMPLANT
SUT MNCRL AB 3-0 PS2 18 (SUTURE) ×6 IMPLANT
SUT PDS AB 1 CTX 36 (SUTURE) ×6 IMPLANT
SUT PROLENE 5 0 C 1 36 (SUTURE) ×2 IMPLANT
SUT SILK  1 MH (SUTURE) ×1
SUT SILK 1 MH (SUTURE) IMPLANT
SUT SILK 2 0 SH CR/8 (SUTURE) ×2 IMPLANT
SUT STEEL 6MS V (SUTURE) ×1 IMPLANT
SUT STEEL STERNAL CCS#1 18IN (SUTURE) IMPLANT
SUT STEEL SZ 6 DBL 3X14 BALL (SUTURE) ×1 IMPLANT
SUT TEM PAC WIRE 2 0 SH (SUTURE) ×2 IMPLANT
SUT VIC AB 2-0 CTX 27 (SUTURE) ×2 IMPLANT
SWAB COLLECTION DEVICE MRSA (MISCELLANEOUS) IMPLANT
SWAB CULTURE ESWAB REG 1ML (MISCELLANEOUS) IMPLANT
SYR BULB IRRIGATION 50ML (SYRINGE) ×1 IMPLANT
SYSTEM SAHARA CHEST DRAIN ATS (WOUND CARE) ×1 IMPLANT
TAPE CLOTH SURG 4X10 WHT LF (GAUZE/BANDAGES/DRESSINGS) ×1 IMPLANT
TOWEL GREEN STERILE (TOWEL DISPOSABLE) ×2 IMPLANT
TOWEL GREEN STERILE FF (TOWEL DISPOSABLE) ×3 IMPLANT
TUBE TRACH EXLNGT  7 SNGL (TUBING) ×1
TUBE TRACH EXLNGT 7 SNGL (TUBING) IMPLANT
TUBE TRACH SHILEY 8 DIST CUF (TUBING) ×1 IMPLANT
WATER STERILE IRR 1000ML POUR (IV SOLUTION) ×3 IMPLANT

## 2019-02-08 NOTE — Progress Notes (Signed)
Dr. Orvan Seen notified of CI remaining below 1.8 for shift, CVP 6, platelets 66. Orders received for 500 albumin.  Will continue to monitor patient status closely.

## 2019-02-08 NOTE — Progress Notes (Addendum)
Advanced Heart Failure Rounding Note   Subjective:    Remains intubated/sedated/paralyzed. FiO2 increased to 60%.  On CVVHD pulling -200/hr. On epi 5, NE 27 mcg, dobutamine 2.5, milrinone 0.25 mcg , and VP 0.04.    Blood CX 10/24 NG   On CVVHD. Weight down another 2 pounds. Intubated/sedated   CO-OX 57%  [place]  CVP 7 PA  18/11  Thermo CO/CI 3.3/1.8    Objective:   Weight Range:  Vital Signs:   Temp:  [96.6 F (35.9 C)-99 F (37.2 C)] 97.2 F (36.2 C) (10/27 0800) Pulse Rate:  [89-101] 99 (10/27 0800) Resp:  [18-29] 18 (10/27 0800) BP: (99-138)/(42-59) 119/52 (10/27 0800) SpO2:  [99 %-100 %] 99 % (10/27 0800) FiO2 (%):  [60 %] 60 % (10/27 0800) Weight:  [87.5 kg] 87.5 kg (10/27 0500) Last BM Date: (PTA)  Weight change: Filed Weights   02/06/19 0430 02/07/19 0600 01/23/2019 0500  Weight: 90.6 kg 88.3 kg 87.5 kg    Intake/Output:   Intake/Output Summary (Last 24 hours) at 02/01/2019 0836 Last data filed at 01/29/2019 0800 Gross per 24 hour  Intake 3749.65 ml  Output 6077 ml  Net -2327.35 ml     Physical Exam: CVP 7--8  General: Intubated/Sedated  HEENT: normal Neck: supple. no JVD. Carotids 2+ bilat; no bruits. No lymphadenopathy or thryomegaly appreciated. Cor: PMI nondisplaced. Regular rate & rhythm. No rubs, gallops or murmurs. VAC in place.  Lungs: clear Abdomen: soft, nontender, nondistended. No hepatosplenomegaly. No bruits or masses. Good bowel sounds. Extremities: no cyanosis, clubbing, rash, edema. RLE mottled  Neuro: Intubated/sedated  Telemetry: SR 80s personally reviewed.   .  Labs: Basic Metabolic Panel: Recent Labs  Lab 02/04/19 1825  02/05/19 3235  02/06/19 0416 02/06/19 1545 02/07/19 0115 02/07/19 0321 02/07/19 1539 01/28/2019 0232  NA  --    < > 141   < > 138 135 137 139 136 135  K  --    < > 4.0   < > 4.7 3.9 3.6 3.6 3.6 3.6  CL  --    < > 106   < > 105 103  --  106 102 104  CO2  --    < > 24   < > 23 23  --  _0 GLUCOSE  --    < > 100*   < > 215* 164*  --  151* 166* 187*  BUN  --    < > 13   < > 16 18  --  _1 CREATININE  --    < > 1.68*   < > 1.60* 1.45*  --  1.31* 1.24* 1.26*  CALCIUM  --    < > 7.8*   < > 7.5* 7.3*  --  7.3* 7.4* 7.6*  MG 1.7  --  2.0  --  2.3  --   --  2.4  --  2.5*  PHOS  --   --  2.9   < > 4.3 3.2  --  3.0 3.1 2.7   < > = values in this interval not displayed.    Liver Function Tests: Recent Labs  Lab 02/04/19 0750  02/04/19 2010  02/06/19 0416 02/06/19 1545 02/07/19 0321 02/07/19 1539 01/16/2019 0232  AST 1,763*  --  5,358*  --   --   --  976*  --   --   ALT 1,425*  --  2,916*  --   --   --  1,760*  --   --   ALKPHOS 32*  --  46  --   --   --  81  --   --   BILITOT 2.6*  --  2.6*  --   --   --  2.9*  --   --   PROT 4.2*  --  4.4*  --   --   --  4.6*  --   --   ALBUMIN 2.8*   < > 2.7*   < > 2.6* 2.4* 2.2* 2.6* 2.4*   < > = values in this interval not displayed.   No results for input(s): LIPASE, AMYLASE in the last 168 hours. No results for input(s): AMMONIA in the last 168 hours.  CBC: Recent Labs  Lab 02/04/19 0616  02/04/19 1640  02/05/19 0339  02/06/19 0358 02/06/19 0416 02/07/19 0115 02/07/19 0321 01/22/2019 0232  WBC 7.3  --  10.8*  --  9.1  --   --  12.9*  --  19.8* 19.4*  NEUTROABS 5.6  --  7.3  --   --   --   --  10.9*  --  16.8*  --   HGB 9.8*   < > 8.6*   < > 10.8*   < > 9.9* 10.1* 10.2* 10.1* 10.4*  HCT 28.9*   < > 24.1*   < > 30.9*   < > 29.0* 30.6* 30.0* 30.5* 32.0*  MCV 91.5  --  87.0  --  88.3  --   --  93.3  --  93.6 95.2  PLT 99*  --  119*  --  125*  --   --  94*  --  89* 66*   < > = values in this interval not displayed.    Cardiac Enzymes: No results for input(s): CKTOTAL, CKMB, CKMBINDEX, TROPONINI in the last 168 hours.  BNP: BNP (last 3 results) No results for input(s): BNP in the last 8760 hours.  ProBNP (last 3 results) No results for input(s): PROBNP in the last 8760 hours.    Other results:  Imaging: Dg  Chest Port 1 View  Result Date: 02/07/2019 CLINICAL DATA:  Endotracheal tube placement. Chest tube. Postop heart surgery. EXAM: PORTABLE CHEST 1 VIEW COMPARISON:  02/05/2019 FINDINGS: Enteric tube courses into the stomach and off the film as tip is not visualized. Endotracheal tube and right IJ Swan-Ganz catheter unchanged. Bilateral chest tubes and mediastinal drain unchanged. Lungs are adequately inflated with hazy bibasilar 10 UA shin likely small layering effusions with associated atelectasis. No evidence of pneumothorax. Cardiomediastinal silhouette and remainder of the exam is unchanged. IMPRESSION: Stable hazy bibasilar opacification likely small layering effusions with atelectasis. Tubes and lines unchanged. Electronically Signed   By: Marin Olp M.D.   On: 02/07/2019 07:32   Dg Abd Portable 1v  Result Date: 02/06/2019 CLINICAL DATA:  Enteric tube placement EXAM: PORTABLE ABDOMEN - 1 VIEW COMPARISON:  03/08/2017 CT abdomen/pelvis FINDINGS: Enteric tube tip in distal stomach. Relatively gasless abdomen. No evidence of pneumatosis or pneumoperitoneum. IMPRESSION: 1. Enteric tube tip in the distal stomach. 2. Relatively gasless abdomen, nonspecific. No evidence of pneumatosis or pneumoperitoneum. Electronically Signed   By: Ilona Sorrel M.D.   On: 02/06/2019 14:02     Medications:     Scheduled Medications: . sodium chloride   Intravenous Once  . sodium chloride   Intravenous Once  . sodium chloride   Intravenous Once  . sodium chloride   Intravenous Once  . sodium  chloride   Intravenous Once  . acetaminophen  1,000 mg Oral Q6H   Or  . acetaminophen (TYLENOL) oral liquid 160 mg/5 mL  1,000 mg Per Tube Q6H  . artificial tears  1 application Both Eyes J5T  . aspirin EC  325 mg Oral Daily   Or  . aspirin  324 mg Per Tube Daily  . B-complex with vitamin C  1 tablet Oral Daily  . bisacodyl  10 mg Oral Daily   Or  . bisacodyl  10 mg Rectal Daily  . chlorhexidine gluconate (MEDLINE  KIT)  15 mL Mouth Rinse BID  . Chlorhexidine Gluconate Cloth  6 each Topical Daily  . darbepoetin (ARANESP) injection - NON-DIALYSIS  60 mcg Subcutaneous Q Wed-1800  . docusate  200 mg Per Tube Daily  . feeding supplement (VITAL HIGH PROTEIN)  1,000 mL Per Tube Q24H  . fentaNYL (SUBLIMAZE) injection  25 mcg Intravenous Once  . insulin aspart  2-6 Units Subcutaneous Q4H  . insulin detemir  8 Units Subcutaneous Q12H  . mouth rinse  15 mL Mouth Rinse 10 times per day  . pantoprazole (PROTONIX) IV  40 mg Intravenous Q24H  . sodium chloride flush  10-40 mL Intracatheter Q12H  . sodium chloride flush  3 mL Intravenous Q12H    Infusions: .  prismasol BGK 4/2.5 200 mL/hr at 02/07/19 2055  .  prismasol BGK 4/2.5 200 mL/hr at 02/06/19 1951  . sodium chloride    . sodium chloride    . sodium chloride 10 mL/hr at 02/12/2019 0800  . ceFEPime (MAXIPIME) IV Stopped (02/04/2019 0129)  . cisatracurium (NIMBEX) infusion 2 mcg/kg/min (01/25/2019 0800)  . DOBUTamine 2.5 mcg/kg/min (01/16/2019 0800)  . epinephrine 5 mcg/min (02/07/2019 0800)  . fentaNYL infusion INTRAVENOUS 50 mcg/hr (02/07/2019 0800)  . insulin Stopped (02/05/19 1229)  . lactated ringers    . lactated ringers    . lactated ringers 10 mL/hr at 02/06/2019 0800  . lactated ringers 10 mL/hr at 01/13/2019 0800  . midazolam 2 mg/hr (02/09/2019 0800)  . milrinone 0.2513 mcg/kg/min (01/25/2019 0800)  . nitroGLYCERIN Stopped (02/04/19 1239)  . norepinephrine (LEVOPHED) Adult infusion 27 mcg/min (01/18/2019 0800)  . phenylephrine (NEO-SYNEPHRINE) Adult infusion    . potassium chloride 50 mL/hr at 01/20/2019 0800  . prismasol BGK 2/2.5 dialysis solution 2,000 mL/hr at 02/12/2019 0349  . vancomycin Stopped (02/07/19 1841)  . vasopressin (PITRESSIN) infusion - *FOR SHOCK* 0.04 Units/min (02/05/2019 0800)    PRN Medications: sodium chloride, fentaNYL, heparin, lactated ringers, metoprolol tartrate, midazolam, morphine injection, ondansetron (ZOFRAN) IV, oxyCODONE,  sodium chloride, sodium chloride flush, sodium chloride flush   Assessment/Plan:    1. Post-cardiotomy cardiogenic shock  -  intra-op TEE 50-55%. Open chest with closure possible today.  - remains critically ill - co-ox 57% on epi 5, NE  27, VP 0.04, milrinone 0.25, and dobutamine 2.5 mcg.  - suspect RV failure playing significant role. continue inotrope support.  - TEE 50-55% RV mod HK with mild external compression.  - continue fluid removal with CVVHD  2. Acute post-op respiratory failure - on FiO2 60% currently.  - continue fluid removal and diuresis.   3. CAD with NSTEMI - cath with 99% distal LM -  s/p CABG 10/22 - continue ASA/statin - no b-blocker with shock  4. AKI - due to shock/ATN - continue CVVHD - Remains anuric  5. Shock liver - transaminases way up due to ischemic injury  - continue supportive care.  -Trending down.  6. Coagulopathy and ABL anemia - in setting of DAPT - replace products as needed - bleeding seems to have slowed. Hgb 10.1. PLTs 125-> 94>89>66 k. Need to follow closely.   7. ID  Blood Cx 10/24 no growth  WBC trending up 12>19.8 >19.4  - On vanc and cefepime.    Length of Stay: [place] NP-C  02/12/2019, 8:36 AM  Advanced Heart Failure Team Pager [phone] (M-F; [place])  Please contact [facility] for night-coverage after hours (4p -7a ) and weekends on [email or web address]  Agree with above.   She remains critically ill. On vent sedated/paralyzed. On high-dose pressors. Chest open. On CVVHD. Weight about 10 pounds up from baseline. Remains on broad spectrum abx. Leg and breast mottled  General:  Intubated/sedated HEENT: normal + ETT Neck: supple. +JVP Carotids 2+ bilat; no bruits. No lymphadenopathy or thryomegaly appreciated. Cor: chest open with spacer  Left breast mttled Lungs: coarse Abdomen: obese soft, nontender, nondistended. No hepatosplenomegaly. No bruits or masses. hypoactive bowel sounds. Extremities: no  cyanosis, clubbing, rash, 2+ edema diffuse ecchymosis. RLE mottled. RFV trialysis Neuro: sedated/paralyzed  She is critically ill with MSOF on full support. For chest closure and trach later today. Continue pressors and CVVHD. On heparin for CVVHD.   CRITICAL CARE Performed by: Glori Bickers  Total critical care time: 45 minutes  Critical care time was exclusive of separately billable procedures and treating other patients.  Critical care was necessary to treat or prevent imminent or life-threatening deterioration.  Critical care was time spent personally by me (independent of midlevel providers or residents) on the following activities: development of treatment plan with patient and/or surrogate as well as nursing, discussions with consultants, evaluation of patient's response to treatment, examination of patient, obtaining history from patient or surrogate, ordering and performing treatments and interventions, ordering and review of laboratory studies, ordering and review of radiographic studies, pulse oximetry and re-evaluation of patient's condition.  Glori Bickers, MD  9:19 AM

## 2019-02-08 NOTE — Progress Notes (Signed)
Goal for the shift is to maintain good ventilation and oxygenation w/ the utilization via mechanical ventilation and INO therapy using appropriate settings to meet systemic and myocardial oxygen demands to assure adequate tissue perfusion. Patient is currently stable at this time no distress or complications noted.   Rianna Lukes L. Jennette Kettle, RRT, RCP

## 2019-02-08 NOTE — Progress Notes (Signed)
5 Days Post-Op Procedure(s) (LRB): CORONARY ARTERY BYPASS GRAFTING (CABG) times three. Free IMA to LAD. Endoscopic harvesting of left greater saphenous vein to OM1 and Posterior Lateral (PLB). (N/A) TRANSESOPHAGEAL ECHOCARDIOGRAM (TEE) (N/A) Subjective: Intubated/sedated  Objective: Vital signs in last 24 hours: Temp:  [96.6 F (35.9 C)-99 F (37.2 C)] 97.2 F (36.2 C) (10/27 0700) Pulse Rate:  [88-101] 99 (10/27 0700) Cardiac Rhythm: Normal sinus rhythm (10/27 0400) Resp:  [18-29] 18 (10/27 0700) BP: (99-138)/(42-59) 104/51 (10/27 0700) SpO2:  [100 %] 100 % (10/27 0700) FiO2 (%):  [60 %-70 %] 60 % (10/27 0300) Weight:  [87.5 kg] 87.5 kg (10/27 0500)  Hemodynamic parameters for last 24 hours: PAP: (17-23)/(9-16) 17/9 CVP:  [2 mmHg-82 mmHg] 7 mmHg CO:  [3.3 L/min-3.6 L/min] 3.3 L/min CI:  [1.8 L/min/m2-2 L/min/m2] 1.8 L/min/m2  Intake/Output from previous day: 10/26 0701 - 10/27 0700 In: 4277.3 [I.V.:3116.3; NG/GT:517.4; IV Piggyback:643.6] Out: 6087 [Chest Tube:90] Intake/Output this shift: No intake/output data recorded.  General appearance: sedated Neurologic: sedated Heart: regular rate and rhythm, S1, S2 normal, no murmur, click, rub or gallop Lungs: clear to auscultation bilaterally Extremities: warm, nonedematous; ecchymotic Wound: dressing dry  Lab Results: Recent Labs    02/07/19 0321 01/29/2019 0232  WBC 19.8* 19.4*  HGB 10.1* 10.4*  HCT 30.5* 32.0*  PLT 89* 66*   BMET:  Recent Labs    02/07/19 1539 01/19/2019 0232  NA 136 135  K 3.6 3.6  CL 102 104  CO2 23 23  GLUCOSE 166* 187*  BUN 16 18  CREATININE 1.24* 1.26*  CALCIUM 7.4* 7.6*    PT/INR:  Recent Labs    02/07/19 0321  LABPROT 15.4*  INR 1.2   ABG    Component Value Date/Time   PHART 7.397 02/07/2019 0115   HCO3 23.1 02/07/2019 0115   TCO2 24 02/07/2019 0115   ACIDBASEDEF 2.0 02/07/2019 0115   O2SAT 56.9 01/26/2019 0250   CBG (last 3)  Recent Labs    02/07/19 2013  02/06/2019 0003 02/02/2019 0330  GLUCAP 164* 178* 150*    Assessment/Plan: S/P Procedure(s) (LRB): CORONARY ARTERY BYPASS GRAFTING (CABG) times three. Free IMA to LAD. Endoscopic harvesting of left greater saphenous vein to OM1 and Posterior Lateral (PLB). (N/A) TRANSESOPHAGEAL ECHOCARDIOGRAM (TEE) (N/A) OR for potential chest closure and open tracheostomy   LOS: 8 days    Paula Watson 01/23/2019

## 2019-02-08 NOTE — Progress Notes (Signed)
Patient ID: Paula Watson, female   DOB: 1945/08/12, 73 y.o.   MRN: [id number]  TCTS Evening Rounds:   Hemodynamically stable  CI = 2.0 Epi 5, NE 40, Milrinone 0.25, dobut 2.5, Vaso 0.04  Fentanyl and Nimbex on vent  CRRT to restart  CT output low  CBC    Component Value Date/Time   WBC 19.4 (H) 02/04/2019 0232   RBC 3.36 (L) 01/28/2019 0232   HGB 10.4 (L) 01/26/2019 0232   HGB 11.0 (L) 01/22/2019 1028   HCT 32.0 (L) 01/23/2019 0232   HCT 32.9 (L) 02/09/2019 1028   PLT 66 (L) 01/25/2019 0232   PLT 287 01/18/2019 1028   MCV 95.2 01/23/2019 0232   MCV 86 01/28/2019 1028   MCH 31.0 02/07/2019 0232   MCHC 32.5 01/15/2019 0232   RDW 15.9 (H) 02/09/2019 0232   RDW 12.4 02/04/2019 1028   LYMPHSABS 1.5 02/07/2019 0321   LYMPHSABS 2.2 01/26/2019 1028   MONOABS 1.3 (H) 02/07/2019 0321   EOSABS 0.1 02/07/2019 0321   EOSABS 0.3 02/02/2019 1028   BASOSABS 0.0 02/07/2019 0321   BASOSABS 0.1 02/09/2019 1028     BMET    Component Value Date/Time   NA 135 01/27/2019 0232   NA 139 01/19/2019 1028   K 3.6 01/29/2019 0232   CL 104 01/18/2019 0232   CO2 23 01/21/2019 0232   GLUCOSE 187 (H) 02/12/2019 0232   BUN 18 01/20/2019 0232   BUN 29 (H) 01/16/2019 1028   CREATININE 1.26 (H) 01/27/2019 0232   CALCIUM 7.6 (L) 01/29/2019 0232   GFRNONAA 42 (L) 01/21/2019 0232   GFRAA 49 (L) 01/31/2019 0232     A/P: Remains critically ill following sternal closure this afternoon.

## 2019-02-08 NOTE — Anesthesia Procedure Notes (Signed)
Arterial Line Insertion Start/End10/23/2020 2:40 PM, 02/08/2019 2:50 PM Performed by: Murvin Natal, MD, anesthesiologist  Patient location: OR. Preanesthetic checklist: patient identified, IV checked, site marked, risks and benefits discussed, surgical consent, monitors and equipment checked, pre-op evaluation, timeout performed and anesthesia consent Patient sedated Right, brachial was placed Catheter size: 20 Fr Hand hygiene performed , maximum sterile barriers used  and Seldinger technique used  Attempts: 1 Procedure performed without using ultrasound guided technique. Following insertion, dressing applied, line sutured and Biopatch. Post procedure assessment: normal and unchanged  Patient tolerated the procedure well with no immediate complications.

## 2019-02-08 NOTE — Progress Notes (Signed)
Subjective:  Remains intubated, sedated and paralyzed. Continues to require 5 pressors. CVVHD pulling -200/hr. No urine output. Plans for return to OR for possible chest closure today.   Objective Vital signs in last 24 hours: Vitals:   01/28/2019 0500 02/07/2019 0600 01/26/2019 0700 02/01/2019 0800  BP: (!) 113/54 (!) 106/59 (!) 104/51 (!) 119/52  Pulse: 97 (!) 101 99 99  Resp: (!) 24 (!) 29 18 18   Temp: (!) 97.2 F (36.2 C) (!) 97.2 F (36.2 C) (!) 97.2 F (36.2 C) (!) 97.2 F (36.2 C)  TempSrc:    Core  SpO2: 100% 100% 100% 99%  Weight: 87.5 kg     Height:       Weight change: -0.8 kg  Intake/Output Summary (Last 24 hours) at 01/24/2019 7867 Last data filed at 01/15/2019 0900 Gross per 24 hour  Intake 3836.58 ml  Output 6173 ml  Net -2336.42 ml    Assessment/ Plan: 73 year old white female with longstanding diabetes mellitus with complications, likely diabetic nephropathy, diffuse vascular disease now presenting with unstable angina/acute coronary syndrome status post CABG on 02/12/2019.  Assessment/Plan: 1. Chronic kidney disease stage IV. Her baseline serum creatinine is in the low twos. Likely secondary to diabetic nephropathy.  2. AKI - ATN in the setting of cardiogenic shock. Remains anuric. Volume status improving with CRRT. Tolerating -200cc/hr. Still approx 7 kg above admission weight. Elytes stable. Continue CRRT at current settings.   3. Acute hypoxemic respiratory failure. Likely 2/2 volume overload. Remains intubated. Continue volume removal with CRRT.   4. Cardiac/pericardial tamponade post CABG. Improved with sternal opening. Returning to OR today for possible closure.    5. Metabolic alkalosis. Likely secondary to blood product resuscitation and hypokalemia. Improved with CRRT.  6. Hypokalemia - repleting    Paula Watson    Labs: Basic Metabolic Panel: Recent Labs  Lab 02/07/19 0321 02/07/19 1539 01/19/2019 0232  NA 139 136 135  K  3.6 3.6 3.6  CL 106 102 104  CO2 23 23 23   GLUCOSE 151* 166* 187*  BUN 16 16 18   CREATININE 1.31* 1.24* 1.26*  CALCIUM 7.3* 7.4* 7.6*  PHOS 3.0 3.1 2.7   Liver Function Tests: Recent Labs  Lab 02/04/19 0750  02/04/19 2010  02/07/19 0321 02/07/19 1539 02/01/2019 0232  AST 1,763*  --  5,358*  --  976*  --   --   ALT 1,425*  --  2,916*  --  1,760*  --   --   ALKPHOS 32*  --  46  --  81  --   --   BILITOT 2.6*  --  2.6*  --  2.9*  --   --   PROT 4.2*  --  4.4*  --  4.6*  --   --   ALBUMIN 2.8*   < > 2.7*   < > 2.2* 2.6* 2.4*   < > = values in this interval not displayed.   No results for input(s): LIPASE, AMYLASE in the last 168 hours. No results for input(s): AMMONIA in the last 168 hours. CBC: Recent Labs  Lab 02/04/19 1640  02/05/19 0339  02/06/19 0416 02/07/19 0115 02/07/19 0321 01/29/2019 0232  WBC 10.8*  --  9.1  --  12.9*  --  19.8* 19.4*  NEUTROABS 7.3  --   --   --  10.9*  --  16.8*  --   HGB 8.6*   < > 10.8*   < > 10.1* 10.2* 10.1* 10.4*  HCT  24.1*   < > 30.9*   < > 30.6* 30.0* 30.5* 32.0*  MCV 87.0  --  88.3  --  93.3  --  93.6 95.2  PLT 119*  --  125*  --  94*  --  89* 66*   < > = values in this interval not displayed.   Cardiac Enzymes: No results for input(s): CKTOTAL, CKMB, CKMBINDEX, TROPONINI in the last 168 hours. CBG: Recent Labs  Lab 02/07/19 1537 02/07/19 2013 01/14/2019 0003 01/28/2019 0330 02/09/2019 0744  GLUCAP 159* 164* 178* 150* 133*    Iron Studies: No results for input(s): IRON, TIBC, TRANSFERRIN, FERRITIN in the last 72 hours. Studies/Results: Dg Chest Port 1 View  Result Date: 02/07/2019 CLINICAL DATA:  Endotracheal tube placement. Chest tube. Postop heart surgery. EXAM: PORTABLE CHEST 1 VIEW COMPARISON:  02/05/2019 FINDINGS: Enteric tube courses into the stomach and off the film as tip is not visualized. Endotracheal tube and right IJ Swan-Ganz catheter unchanged. Bilateral chest tubes and mediastinal drain unchanged. Lungs are  adequately inflated with hazy bibasilar 10 UA shin likely small layering effusions with associated atelectasis. No evidence of pneumothorax. Cardiomediastinal silhouette and remainder of the exam is unchanged. IMPRESSION: Stable hazy bibasilar opacification likely small layering effusions with atelectasis. Tubes and lines unchanged. Electronically Signed   By: Marin Olp M.D.   On: 02/07/2019 07:32   Dg Abd Portable 1v  Result Date: 02/06/2019 CLINICAL DATA:  Enteric tube placement EXAM: PORTABLE ABDOMEN - 1 VIEW COMPARISON:  03/08/2017 CT abdomen/pelvis FINDINGS: Enteric tube tip in distal stomach. Relatively gasless abdomen. No evidence of pneumatosis or pneumoperitoneum. IMPRESSION: 1. Enteric tube tip in the distal stomach. 2. Relatively gasless abdomen, nonspecific. No evidence of pneumatosis or pneumoperitoneum. Electronically Signed   By: Ilona Sorrel M.D.   On: 02/06/2019 14:02   Medications: Infusions: .  prismasol BGK 4/2.5 200 mL/hr at 02/07/19 2055  .  prismasol BGK 4/2.5 200 mL/hr at 02/06/19 1951  . sodium chloride    . sodium chloride    . sodium chloride 10 mL/hr at 01/18/2019 0900  . ceFEPime (MAXIPIME) IV Stopped (01/15/2019 0129)  . cisatracurium (NIMBEX) infusion 2 mcg/kg/min (02/12/2019 0900)  . DOBUTamine 2.5 mcg/kg/min (02/11/2019 0900)  . epinephrine 5 mcg/min (01/22/2019 0900)  . fentaNYL infusion INTRAVENOUS 50 mcg/hr (01/24/2019 0900)  . insulin Stopped (02/05/19 1229)  . lactated ringers    . lactated ringers    . lactated ringers 10 mL/hr at 01/27/2019 0900  . lactated ringers 10 mL/hr at 01/17/2019 0900  . midazolam 2 mg/hr (02/05/2019 0900)  . milrinone 0.2513 mcg/kg/min (01/18/2019 0900)  . nitroGLYCERIN Stopped (02/04/19 1239)  . norepinephrine (LEVOPHED) Adult infusion 29 mcg/min (01/16/2019 0900)  . phenylephrine (NEO-SYNEPHRINE) Adult infusion    . prismasol BGK 2/2.5 dialysis solution 2,000 mL/hr at 02/07/2019 0839  . vancomycin Stopped (02/07/19 1841)  . vasopressin  (PITRESSIN) infusion - *FOR SHOCK* 0.04 Units/min (01/18/2019 0900)    Scheduled Medications: . sodium chloride   Intravenous Once  . sodium chloride   Intravenous Once  . sodium chloride   Intravenous Once  . sodium chloride   Intravenous Once  . sodium chloride   Intravenous Once  . acetaminophen  1,000 mg Oral Q6H   Or  . acetaminophen (TYLENOL) oral liquid 160 mg/5 mL  1,000 mg Per Tube Q6H  . artificial tears  1 application Both Eyes G6Y  . aspirin EC  325 mg Oral Daily   Or  . aspirin  324 mg  Per Tube Daily  . B-complex with vitamin C  1 tablet Oral Daily  . bisacodyl  10 mg Oral Daily   Or  . bisacodyl  10 mg Rectal Daily  . chlorhexidine gluconate (MEDLINE KIT)  15 mL Mouth Rinse BID  . Chlorhexidine Gluconate Cloth  6 each Topical Daily  . darbepoetin (ARANESP) injection - NON-DIALYSIS  60 mcg Subcutaneous Q Wed-1800  . docusate  200 mg Per Tube Daily  . feeding supplement (VITAL HIGH PROTEIN)  1,000 mL Per Tube Q24H  . fentaNYL (SUBLIMAZE) injection  25 mcg Intravenous Once  . insulin aspart  2-6 Units Subcutaneous Q4H  . insulin detemir  8 Units Subcutaneous Q12H  . mouth rinse  15 mL Mouth Rinse 10 times per day  . pantoprazole (PROTONIX) IV  40 mg Intravenous Q24H  . sodium chloride flush  10-40 mL Intracatheter Q12H  . sodium chloride flush  3 mL Intravenous Q12H    have reviewed scheduled and prn medications.  Physical Exam: General: sedated, breathing comfortably on ventilator Neck: supple. No JVD.  Heart: RRR. Chest open with wound vac in place.   Lungs: CTAB Abdomen: soft, non-distended Extremities: 2+ pitting edema in extremities  Skin: scattered ecchymoses  Neuro: RASS -5    01/15/2019,9:06 AM  LOS: 8 days

## 2019-02-08 NOTE — Anesthesia Preprocedure Evaluation (Addendum)
Anesthesia Evaluation  Patient identified by MRN, date of birth, ID band Patient unresponsive    Reviewed: Allergy & Precautions, Patient's Chart, lab work & pertinent test results  Airway Mallampati: Intubated       Dental   Pulmonary COPD, former smoker,       + intubated    Cardiovascular hypertension, Pt. on home beta blockers and Pt. on medications + CAD, + Past MI and + Peripheral Vascular Disease  Normal cardiovascular exam     Neuro/Psych TIACVA, Residual Symptoms    GI/Hepatic Neg liver ROS, hiatal hernia, GERD  Medicated and Controlled,  Endo/Other  diabetes, Oral Hypoglycemic Agents  Renal/GU Renal disease     Musculoskeletal negative musculoskeletal ROS (+)   Abdominal (+) + obese,   Peds  Hematology  (+) anemia , HLD Thrombocytopenia    Anesthesia Other Findings OPEN CHEST  Reproductive/Obstetrics                           Anesthesia Physical Anesthesia Plan  ASA: IV  Anesthesia Plan: General   Post-op Pain Management:    Induction: Intravenous and Inhalational  PONV Risk Score and Plan: 3 and Ondansetron, Dexamethasone, Midazolam and Treatment may vary due to age or medical condition  Airway Management Planned: Oral ETT and Tracheostomy  Additional Equipment:   Intra-op Plan:   Post-operative Plan: Post-operative intubation/ventilation  Informed Consent: I have reviewed the patients History and Physical, chart, labs and discussed the procedure including the risks, benefits and alternatives for the proposed anesthesia with the patient or authorized representative who has indicated his/her understanding and acceptance.     Consent reviewed with POA  Plan Discussed with: CRNA  Anesthesia Plan Comments: (Anesthetic plan discussed with daughter in ICU.)        Anesthesia Quick Evaluation

## 2019-02-08 NOTE — Transfer of Care (Signed)
Immediate Anesthesia Transfer of Care Note  Patient: Paula Watson  Procedure(s) Performed: STERNAL CLOSURE (N/A Chest) TRACHEOSTOMY (N/A Neck)  Patient Location: ICU  Anesthesia Type:General  Level of Consciousness: Patient remains intubated per anesthesia plan  Airway & Oxygen Therapy: Patient remains intubated per anesthesia plan and Patient placed on Ventilator (see vital sign flow sheet for setting)  Post-op Assessment: Report given to RN and Post -op Vital signs reviewed and stable  Post vital signs: Reviewed and stable  Last Vitals:  Vitals Value Taken Time  BP    Temp    Pulse    Resp    SpO2      Last Pain:  Vitals:   02/03/2019 1200  TempSrc: Core  PainSc:       Patients Stated Pain Goal: 0 (16/60/63 0160)  Complications: No apparent anesthesia complications

## 2019-02-08 NOTE — Progress Notes (Signed)
Dr Orvan Seen at bedside after patient arrival from the OR. Verbal orders received to stop sedation and Nimbex and begin weaning Ntitric per [facility] protocol. MD made aware of patient's current blood pressure and RN maxed on all pressors. Order received for 5108mL of Albumin and the keep even on CRRT.

## 2019-02-08 NOTE — Plan of Care (Signed)
  Problem: Clinical Measurements: Goal: Will remain free from infection Outcome: Progressing   Problem: Education: Goal: Knowledge of General Education information will improve Description: Including pain rating scale, medication(s)/side effects and non-pharmacologic comfort measures Outcome: Not Progressing   Problem: Health Behavior/Discharge Planning: Goal: Ability to manage health-related needs will improve Outcome: Not Progressing   Problem: Clinical Measurements: Goal: Ability to maintain clinical measurements within normal limits will improve Outcome: Not Progressing Goal: Diagnostic test results will improve Outcome: Not Progressing Goal: Respiratory complications will improve Outcome: Not Progressing   Problem: Activity: Goal: Risk for activity intolerance will decrease Outcome: Not Progressing

## 2019-02-08 NOTE — Brief Op Note (Signed)
02/05/2019  5:59 PM  PATIENT:  Paula Watson  73 y.o. female  PRE-OPERATIVE DIAGNOSIS:  OPEN CHEST  POST-OPERATIVE DIAGNOSIS:  OPEN CHEST  PROCEDURE:  Procedure(s): STERNAL CLOSURE (N/A) TRACHEOSTOMY (N/A)  SURGEON:  Surgeon(s) and Role:    * Wonda Olds, MD - Primary  PHYSICIAN ASSISTANT: none  ASSISTANTS: staff   ANESTHESIA:   general  EBL:  minimal  BLOOD ADMINISTERED:none  DRAINS: 4 Chest Tube(s) in the mediastinum and bilateral pleural spaces   LOCAL MEDICATIONS USED:  NONE  SPECIMEN: sternal debris for gram stain and culture  DISPOSITION OF SPECIMEN:  microbiology  COUNTS:  YES  TOURNIQUET:  * No tourniquets in log *  DICTATION: .Note written in EPIC  PLAN OF CARE: Admit to inpatient   PATIENT DISPOSITION:  ICU - intubated and critically ill.   Delay start of Pharmacological VTE agent (>24hrs) due to surgical blood loss or risk of bleeding: yes  Paula Watson, [place]

## 2019-02-09 ENCOUNTER — Inpatient Hospital Stay (HOSPITAL_COMMUNITY): Payer: Medicare Other

## 2019-02-09 DIAGNOSIS — L8990 Pressure ulcer of unspecified site, unspecified stage: Secondary | ICD-10-CM | POA: Insufficient documentation

## 2019-02-09 LAB — CBC
HCT: 24.2 % — ABNORMAL LOW (ref 36.0–46.0)
Hemoglobin: 7.8 g/dL — ABNORMAL LOW (ref 12.0–15.0)
MCH: 30.7 pg (ref 26.0–34.0)
MCHC: 32.2 g/dL (ref 30.0–36.0)
MCV: 95.3 fL (ref 80.0–100.0)
Platelets: 48 10*3/uL — ABNORMAL LOW (ref 150–400)
RBC: 2.54 MIL/uL — ABNORMAL LOW (ref 3.87–5.11)
RDW: 16.5 % — ABNORMAL HIGH (ref 11.5–15.5)
WBC: 15 10*3/uL — ABNORMAL HIGH (ref 4.0–10.5)
nRBC: 1.2 % — ABNORMAL HIGH (ref 0.0–0.2)

## 2019-02-09 LAB — POCT I-STAT 7, (LYTES, BLD GAS, ICA,H+H)
Acid-base deficit: 8 mmol/L — ABNORMAL HIGH (ref 0.0–2.0)
Acid-base deficit: 2 mmol/L (ref 0.0–2.0)
Acid-base deficit: 2 mmol/L (ref 0.0–2.0)
Bicarbonate: 18.5 mmol/L — ABNORMAL LOW (ref 20.0–28.0)
Bicarbonate: 23.8 mmol/L (ref 20.0–28.0)
Bicarbonate: 23.8 mmol/L (ref 20.0–28.0)
Calcium, Ion: 1.28 mmol/L (ref 1.15–1.40)
Calcium, Ion: 1.22 mmol/L (ref 1.15–1.40)
Calcium, Ion: 1.18 mmol/L (ref 1.15–1.40)
HCT: 22 % — ABNORMAL LOW (ref 36.0–46.0)
HCT: 23 % — ABNORMAL LOW (ref 36.0–46.0)
HCT: 23 % — ABNORMAL LOW (ref 36.0–46.0)
Hemoglobin: 7.5 g/dL — ABNORMAL LOW (ref 12.0–15.0)
Hemoglobin: 7.8 g/dL — ABNORMAL LOW (ref 12.0–15.0)
Hemoglobin: 7.8 g/dL — ABNORMAL LOW (ref 12.0–15.0)
O2 Saturation: 92 %
O2 Saturation: 100 %
O2 Saturation: 100 %
Patient temperature: 35
Potassium: 3.6 mmol/L (ref 3.5–5.1)
Potassium: 3.7 mmol/L (ref 3.5–5.1)
Potassium: 4 mmol/L (ref 3.5–5.1)
Sodium: 140 mmol/L (ref 135–145)
Sodium: 137 mmol/L (ref 135–145)
Sodium: 137 mmol/L (ref 135–145)
TCO2: 20 mmol/L — ABNORMAL LOW (ref 22–32)
TCO2: 25 mmol/L (ref 22–32)
TCO2: 25 mmol/L (ref 22–32)
pCO2 arterial: 40 mmHg (ref 32.0–48.0)
pCO2 arterial: 39.2 mmHg (ref 32.0–48.0)
pCO2 arterial: 43.7 mmHg (ref 32.0–48.0)
pH, Arterial: 7.272 — ABNORMAL LOW (ref 7.350–7.450)
pH, Arterial: 7.383 (ref 7.350–7.450)
pH, Arterial: 7.344 — ABNORMAL LOW (ref 7.350–7.450)
pO2, Arterial: 72 mmHg — ABNORMAL LOW (ref 83.0–108.0)
pO2, Arterial: 184 mmHg — ABNORMAL HIGH (ref 83.0–108.0)
pO2, Arterial: 195 mmHg — ABNORMAL HIGH (ref 83.0–108.0)

## 2019-02-09 LAB — RENAL FUNCTION PANEL
Albumin: 2.9 g/dL — ABNORMAL LOW (ref 3.5–5.0)
Albumin: 2.8 g/dL — ABNORMAL LOW (ref 3.5–5.0)
Anion gap: 7 (ref 5–15)
Anion gap: 12 (ref 5–15)
BUN: 18 mg/dL (ref 8–23)
BUN: 15 mg/dL (ref 8–23)
CO2: 23 mmol/L (ref 22–32)
CO2: 20 mmol/L — ABNORMAL LOW (ref 22–32)
Calcium: 8.2 mg/dL — ABNORMAL LOW (ref 8.9–10.3)
Calcium: 8.1 mg/dL — ABNORMAL LOW (ref 8.9–10.3)
Chloride: 106 mmol/L (ref 98–111)
Chloride: 104 mmol/L (ref 98–111)
Creatinine, Ser: 1.24 mg/dL — ABNORMAL HIGH (ref 0.44–1.00)
Creatinine, Ser: 1.07 mg/dL — ABNORMAL HIGH (ref 0.44–1.00)
GFR calc Af Amer: 50 mL/min — ABNORMAL LOW (ref >60)
GFR calc Af Amer: 60 mL/min — ABNORMAL LOW (ref >60)
GFR calc non Af Amer: 43 mL/min — ABNORMAL LOW (ref >60)
GFR calc non Af Amer: 51 mL/min — ABNORMAL LOW (ref >60)
Glucose, Bld: 133 mg/dL — ABNORMAL HIGH (ref 70–99)
Glucose, Bld: 114 mg/dL — ABNORMAL HIGH (ref 70–99)
Phosphorus: 2.5 mg/dL (ref 2.5–4.6)
Phosphorus: 2.4 mg/dL — ABNORMAL LOW (ref 2.5–4.6)
Potassium: 3.7 mmol/L (ref 3.5–5.1)
Potassium: 3.5 mmol/L (ref 3.5–5.1)
Sodium: 136 mmol/L (ref 135–145)
Sodium: 136 mmol/L (ref 135–145)

## 2019-02-09 LAB — GLUCOSE, CAPILLARY
Glucose-Capillary: 124 mg/dL — ABNORMAL HIGH (ref 70–99)
Glucose-Capillary: 101 mg/dL — ABNORMAL HIGH (ref 70–99)
Glucose-Capillary: 109 mg/dL — ABNORMAL HIGH (ref 70–99)
Glucose-Capillary: 102 mg/dL — ABNORMAL HIGH (ref 70–99)
Glucose-Capillary: 116 mg/dL — ABNORMAL HIGH (ref 70–99)
Glucose-Capillary: 181 mg/dL — ABNORMAL HIGH (ref 70–99)

## 2019-02-09 LAB — BASIC METABOLIC PANEL
Anion gap: 8 (ref 5–15)
BUN: 18 mg/dL (ref 8–23)
CO2: 22 mmol/L (ref 22–32)
Calcium: 8.2 mg/dL — ABNORMAL LOW (ref 8.9–10.3)
Chloride: 106 mmol/L (ref 98–111)
Creatinine, Ser: 1.23 mg/dL — ABNORMAL HIGH (ref 0.44–1.00)
GFR calc Af Amer: 50 mL/min — ABNORMAL LOW (ref >60)
GFR calc non Af Amer: 43 mL/min — ABNORMAL LOW (ref >60)
Glucose, Bld: 133 mg/dL — ABNORMAL HIGH (ref 70–99)
Potassium: 3.7 mmol/L (ref 3.5–5.1)
Sodium: 136 mmol/L (ref 135–145)

## 2019-02-09 LAB — HEMOGLOBIN AND HEMATOCRIT, BLOOD
HCT: 35.9 % — ABNORMAL LOW (ref 36.0–46.0)
Hemoglobin: 12.3 g/dL (ref 12.0–15.0)

## 2019-02-09 LAB — MAGNESIUM: Magnesium: 2.7 mg/dL — ABNORMAL HIGH (ref 1.7–2.4)

## 2019-02-09 LAB — PREPARE RBC (CROSSMATCH)

## 2019-02-09 MED ORDER — LIDOCAINE VISCOUS HCL 2 % MT SOLN
15.0000 mL | Freq: Once | OROMUCOSAL | Status: AC
  Administered 2019-02-09: 13:00:00 3 mL via OROMUCOSAL

## 2019-02-09 MED ORDER — CHLORHEXIDINE GLUCONATE CLOTH 2 % EX PADS
6.0000 | MEDICATED_PAD | Freq: Every day | CUTANEOUS | Status: DC
  Administered 2019-02-10: 6 via TOPICAL

## 2019-02-09 MED ORDER — IOHEXOL 300 MG/ML  SOLN
50.0000 mL | Freq: Once | INTRAMUSCULAR | Status: AC | PRN
  Administered 2019-02-09: 30 mL

## 2019-02-09 MED ORDER — PRISMASOL BGK 4/2.5 32-4-2.5 MEQ/L IV SOLN
INTRAVENOUS | Status: DC
  Administered 2019-02-09 – 2019-02-10 (×6): via INTRAVENOUS_CENTRAL

## 2019-02-09 MED ORDER — POTASSIUM CHLORIDE 10 MEQ/50ML IV SOLN
10.0000 meq | INTRAVENOUS | Status: AC
  Administered 2019-02-09 (×3): 10 meq via INTRAVENOUS
  Filled 2019-02-09 (×2): qty 50

## 2019-02-09 MED ORDER — VITAL HIGH PROTEIN PO LIQD
1000.0000 mL | ORAL | Status: DC
  Administered 2019-02-09: 1000 mL

## 2019-02-09 MED ORDER — LIDOCAINE VISCOUS HCL 2 % MT SOLN
OROMUCOSAL | Status: AC
  Administered 2019-02-09: 3 mL via OROMUCOSAL
  Filled 2019-02-09: qty 15

## 2019-02-09 MED ORDER — SODIUM CHLORIDE 0.9% IV SOLUTION
Freq: Once | INTRAVENOUS | Status: AC
  Administered 2019-02-09: 11:00:00 via INTRAVENOUS

## 2019-02-09 NOTE — Progress Notes (Signed)
Advanced Heart Failure Rounding Note   Subjective:    Sternum closed and trach placed yesterday. On vent via trach. No sedation overnight  Not responding yet. On CVVHD at 200  On Epi 5 dobutamine NE 12  Swan #s CVP 8 PA 26/17 Thermo 3.6/1.9   Objective:   Weight Range:  Vital Signs:   Temp:  [93.4 F (34.1 C)-97.9 F (36.6 C)] 96.1 F (35.6 C) (10/28 0800) Pulse Rate:  [51-103] 90 (10/28 0800) Resp:  [15-23] 18 (10/28 0800) BP: (93-136)/(38-94) 110/51 (10/28 0800) SpO2:  [93 %-100 %] 94 % (10/28 0839) Arterial Line BP: (123-146)/(42-53) 123/42 (10/28 0700) FiO2 (%):  [60 %-100 %] 90 % (10/28 0839) Weight:  [85.3 kg] 85.3 kg (10/28 0422) Last BM Date: (PTA)  Weight change: Filed Weights   02/07/19 0600 01/24/2019 0500 02/09/19 0422  Weight: 88.3 kg 87.5 kg 85.3 kg    Intake/Output:   Intake/Output Summary (Last 24 hours) at 02/09/2019 0903 Last data filed at 02/09/2019 0800 Gross per 24 hour  Intake 3571.56 ml  Output 5450 ml  Net -1878.44 ml     Physical Exam: CVP 8 General:  On vent via trach. Not responding yet HEENT: normal Neck: supple. + trach RIJ TLC. Carotids 2+ bilat; no bruits. No lymphadenopathy or thryomegaly appreciated. Cor: Sternum closed. Wound vac in place left breast mottled Lungs: coarse Abdomen: obese soft, nontender, nondistended. No hepatosplenomegaly. No bruits or masses. Good bowel sounds. Extremities: no cyanosis, clubbing, rash, 1+ edema RFV trailysis cath Neuro: not-responsive on vent   Telemetry: SR 90s personally reviewed.   .  Labs: Basic Metabolic Panel: Recent Labs  Lab 02/05/19 0339  02/06/19 0416  02/07/19 0321 02/07/19 1539 01/14/2019 0232 02/04/2019 1704 01/30/2019 1833 01/27/2019 1842 02/09/19 0339 02/09/19 0457  NA 141   < > 138   < > 139 136 135 138 134* 139 136   136 137  K 4.0   < > 4.7   < > 3.6 3.6 3.6 3.8 3.6 3.7 3.7   3.7 3.7  CL 106   < > 105   < > 106 102 104  --  105  --  106   106  --   CO2 24    < > 23   < > _0 --  20*  --  22   23  --   GLUCOSE 100*   < > 215*   < > 151* 166* 187*  --  178*  --  133*   133*  --   BUN 13   < > 16   < > _1 --  25*  --  18   18  --   CREATININE 1.68*   < > 1.60*   < > 1.31* 1.24* 1.26*  --  1.68*  --  1.23*   1.24*  --   CALCIUM 7.8*   < > 7.5*   < > 7.3* 7.4* 7.6*  --  8.4*  --  8.2*   8.2*  --   MG 2.0  --  2.3  --  2.4  --  2.5*  --   --   --  2.7*  --   PHOS 2.9   < > 4.3   < > 3.0 3.1 2.7  --  4.1  --  2.5  --    < > = values in this interval not displayed.    Liver Function Tests: Recent Labs  Lab 02/04/19 0750  02/04/19 2010  02/07/19 0321 02/07/19 1539 01/30/2019 0232 01/16/2019 1833 02/09/19 0339  AST 1,763*  --  5,358*  --  976*  --   --   --   --   ALT 1,425*  --  2,916*  --  1,760*  --   --   --   --   ALKPHOS 32*  --  46  --  81  --   --   --   --   BILITOT 2.6*  --  2.6*  --  2.9*  --   --   --   --   PROT 4.2*  --  4.4*  --  4.6*  --   --   --   --   ALBUMIN 2.8*   < > 2.7*   < > 2.2* 2.6* 2.4* 2.4* 2.9*   < > = values in this interval not displayed.   No results for input(s): LIPASE, AMYLASE in the last 168 hours. No results for input(s): AMMONIA in the last 168 hours.  CBC: Recent Labs  Lab 02/04/19 0616  02/04/19 1640  02/06/19 0416  02/07/19 0321 02/07/2019 0232 01/22/2019 1704 02/07/2019 1833 01/28/2019 1842 02/09/19 0339 02/09/19 0457  WBC 7.3  --  10.8*   < > 12.9*  --  19.8* 19.4*  --  12.7*  --  15.0*  --   NEUTROABS 5.6  --  7.3  --  10.9*  --  16.8*  --   --  10.2*  --   --   --   HGB 9.8*   < > 8.6*   < > 10.1*   < > 10.1* 10.4* 7.8* 7.9* 7.8* 7.8* 7.8*  HCT 28.9*   < > 24.1*   < > 30.6*   < > 30.5* 32.0* 23.0* 24.2* 23.0* 24.2* 23.0*  MCV 91.5  --  87.0   < > 93.3  --  93.6 95.2  --  96.8  --  95.3  --   PLT 99*  --  119*   < > 94*  --  89* 66*  --  46*  --  48*  --    < > = values in this interval not displayed.    Cardiac Enzymes: No results for input(s): CKTOTAL, CKMB, CKMBINDEX,  TROPONINI in the last 168 hours.  BNP: BNP (last 3 results) No results for input(s): BNP in the last 8760 hours.  ProBNP (last 3 results) No results for input(s): PROBNP in the last 8760 hours.    Other results:  Imaging: Dg Chest 1 View  Result Date: 01/25/2019 CLINICAL DATA:  Postop cardiac surgery EXAM: CHEST  1 VIEW COMPARISON:  Radiograph 02/07/2019 FINDINGS: Tracheostomy tube terminates in the mid trachea with removal of the previously seen endotracheal tube. Pulmonary artery catheter terminates in the main pulmonary artery. Transesophageal tube tip and side port distal to the GE junction mediastinal and bilateral pleural drains remain in place. Abandoned epicardial pacer wires are noted. Sternal sutures remain intact and aligned. Surgical clips project over the mediastinum. There is a steep left anterior obliquity superimposing the heart of the right chest. Basilar atelectatic changes are again noted no visible pneumothorax. Likely small layering effusions. Small amount of density adjacent the right pleural drain may reflect a small amount of adjacent hemorrhage. IMPRESSION: 1. Tracheostomy tube terminates in the mid trachea. 2. Transesophageal tube tip and side port distal to the GE junction. 3. Mediastinal and pleural drains remain in  place. 4. Likely small layering bilateral pleural effusions and bibasilar atelectatic changes. No visible pneumothorax. Electronically Signed   By: Lovena Le M.D.   On: 01/30/2019 18:48   Dg Chest Port 1 View  Result Date: 02/09/2019 CLINICAL DATA:  Endotracheal tube and chest tube assessment. EXAM: PORTABLE CHEST 1 VIEW COMPARISON:  01/30/2019 FINDINGS: Patient is rotated to the right. Tracheostomy tube is unchanged. Right IJ Swan-Ganz catheter has tip over the main pulmonary artery segment unchanged. Enteric tube courses into the region of the stomach and off the film as tip is not visualized. Bilateral chest tubes and mediastinal drain unchanged.  Lungs are adequately inflated with minimal medial left basilar opacification likely atelectasis. Possible small amount of layering left pleural fluid. Mild stable cardiomegaly. Remainder the exam is unchanged. IMPRESSION: Minimal left basilar atelectasis with possible small left effusion. Multiple tubes and lines unchanged. Electronically Signed   By: Marin Olp M.D.   On: 02/09/2019 08:16     Medications:     Scheduled Medications:  sodium chloride   Intravenous Once   sodium chloride   Intravenous Once   sodium chloride   Intravenous Once   sodium chloride   Intravenous Once   sodium chloride   Intravenous Once   sodium chloride   Intravenous Once   artificial tears  1 application Both Eyes Q3R   aspirin EC  325 mg Oral Daily   Or   aspirin  324 mg Per Tube Daily   B-complex with vitamin C  1 tablet Oral Daily   bisacodyl  10 mg Oral Daily   Or   bisacodyl  10 mg Rectal Daily   chlorhexidine gluconate (MEDLINE KIT)  15 mL Mouth Rinse BID   Chlorhexidine Gluconate Cloth  6 each Topical Daily   darbepoetin (ARANESP) injection - NON-DIALYSIS  60 mcg Subcutaneous Q Wed-1800   docusate  200 mg Per Tube Daily   feeding supplement (VITAL HIGH PROTEIN)  1,000 mL Per Tube Q24H   fentaNYL (SUBLIMAZE) injection  25 mcg Intravenous Once   insulin aspart  2-6 Units Subcutaneous Q4H   insulin detemir  8 Units Subcutaneous Q12H   mouth rinse  15 mL Mouth Rinse 10 times per day   pantoprazole (PROTONIX) IV  40 mg Intravenous Q24H   silver sulfADIAZINE   Topical BID   sodium chloride flush  10-40 mL Intracatheter Q12H   sodium chloride flush  3 mL Intravenous Q12H    Infusions:   prismasol BGK 4/2.5 200 mL/hr at 02/09/19 0546    prismasol BGK 4/2.5 200 mL/hr at 02/06/19 1951   sodium chloride     sodium chloride     sodium chloride 10 mL/hr at 01/20/2019 1100   amiodarone 30 mg/hr (02/09/19 0800)   ceFEPime (MAXIPIME) IV Stopped (02/09/19 0076)    cisatracurium (NIMBEX) infusion 2 mcg/kg/min (01/30/2019 1300)   DOBUTamine 2.5 mcg/kg/min (02/09/19 0800)   epinephrine 5 mcg/min (02/09/19 0800)   fentaNYL infusion INTRAVENOUS Stopped (01/25/2019 1847)   insulin Stopped (02/05/19 1229)   lactated ringers     lactated ringers     lactated ringers 10 mL/hr at 02/09/19 0800   lactated ringers 10 mL/hr at 02/09/2019 2100   midazolam Stopped (01/24/2019 1837)   milrinone     nitroGLYCERIN 5 mcg/min (01/22/2019 1504)   nitroGLYCERIN     norepinephrine (LEVOPHED) Adult infusion 12 mcg/min (02/09/19 0800)   phenylephrine (NEO-SYNEPHRINE) Adult infusion     potassium chloride 10 mEq (02/09/19 0819)   prismasol  BGK 2/2.5 dialysis solution 2,000 mL/hr at 02/09/2019 2044   vancomycin Stopped (02/04/2019 2321)   vasopressin (PITRESSIN) infusion - *FOR SHOCK* 0.04 Units/min (02/09/19 0800)    PRN Medications: sodium chloride, fentaNYL, heparin, lactated ringers, metoprolol tartrate, midazolam, morphine injection, ondansetron (ZOFRAN) IV, oxyCODONE, sodium chloride, sodium chloride flush, sodium chloride flush   Assessment/Plan:    1. Post-cardiotomy cardiogenic shock  -  intra-op TEE 50-55%.  - sternum closed and trach placed 10/27 - hemodynamics improved but still tenuous. on epi 5, NE  17 and dobutamine 2.5 mcg.  - wean slowly - TEE 50-55% RV mod HK with mild external compression.  - Volume status improved but she is still 8 pounds up from baseline. Getting blood and albumin today.  continue fluid removal with CVVHD. Turn rate down to 150  2. Acute post-op respiratory failure - on vent through trach. Wean as tolerated. She has long way to go - waiting for mental status to improve  3. CAD with NSTEMI - cath with 99% distal LM -  s/p CABG 10/22 - continue ASA/statin - no b-blocker with shock  4. AKI - due to shock/ATN - continue CVVHD as above - Remains anuric  5. Shock liver - transaminases way up due to ischemic  injury  - continue supportive care.  -Trending down.   6. Coagulopathy and ABL anemia - in setting of DAPT - replace products as needed - bleeding seems to have slowed. Hgb 10.1. PLTs 125-> 94>89>66k> 48K.  - will send HIT. If screen positive would change to argatroban pending SRA confirmation Discussed dosing with PharmD personally.  7. ID  - Blood Cx 10/24 no growth  - on broad spectrum abx  CRITICAL CARE Performed by: Glori Bickers  Total critical care time: 45 minutes  Critical care time was exclusive of separately billable procedures and treating other patients.  Critical care was necessary to treat or prevent imminent or life-threatening deterioration.  Critical care was time spent personally by me (independent of midlevel providers or residents) on the following activities: development of treatment plan with patient and/or surrogate as well as nursing, discussions with consultants, evaluation of patient's response to treatment, examination of patient, obtaining history from patient or surrogate, ordering and performing treatments and interventions, ordering and review of laboratory studies, ordering and review of radiographic studies, pulse oximetry and re-evaluation of patient's condition.   Length of Stay: 9   Glori Bickers MD 02/09/2019, 9:03 AM  Advanced Heart Failure Team Pager [phone] (M-F; [place])  Please contact [facility] for night-coverage after hours (4p -7a ) and weekends on [email or web address]

## 2019-02-09 NOTE — Progress Notes (Signed)
Cortrak Tube Team Note:  Consult received to place a Cortrak feeding tube.   Post pyloric tube placed by diagnostic radiology at bedside. RD placed bridle to secure tube.   If the tube becomes dislodged please keep the tube and contact the Cortrak team at [email or web address] (password [email or web address]) for replacement.  If after hours and replacement cannot be delayed, place a NG tube and confirm placement with an abdominal x-ray.    Mariana Single RD, LDN Clinical Nutrition Pager # [phone]

## 2019-02-09 NOTE — Op Note (Signed)
Procedure(s): STERNAL CLOSURE TRACHEOSTOMY Procedure Note  ILEANE SANDO female 73 y.o. 02/09/2019  Procedure(s) and Anesthesia Type:    * STERNAL CLOSURE - General with rigid sternal reconstruction technique; application of negative pressure dressing to soft tissue    * TRACHEOSTOMY, Open - General  Surgeon(s) and Role:    * Wonda Olds, MD - Primary   Indications: The patient was admitted to the hospital with a 3-week istory of chest pain and nausea. Work-up revealed severe ostial LM CAD. She underwent high-risk CABG and required open chest due to right heart dysfunction. The patient now presents for chest closure and tracheostomy.     Surgeon: Wonda Olds   Assistants: OR staff  Anesthesia: General endotracheal anesthesia  ASA Class: 5    Procedure Detail  STERNAL CLOSURE, TRACHEOSTOMY After obtaining informed consent family, the patient is taken the operating room on 01/31/2019 and placed in the supine position.  Anesthesia was confirmed.  A right brachial arterial line was placed by the anesthetic team.  The anterior chest neck and abdomen are cleansed and draped with Betadine paint and soap.  Preop surgical pause was performed.  The mediastinum was explored and a posterior pericardial clot is evacuated.  Copious irrigation is undertaken.  Bilateral pleural spaces are inspected to evacuate moderate sized thin effusions.  Sternum is in reapproximated using a linear plating system on each side of the hemisternum followed by sternal wire Cerclage.  Next attention was turned to the anterior neck where an incision was made just below the palpable thyroid cartilage. The platysma was divided. The thyroid gland was divided in the midline between suture ligatures to expose the anterior trachea. A #8 Shiley trachestomy appliance was inserted into the trachea after tracheotomy under direct vision. There was immediate return of CO2 to the anesthesia monitoring system. The  tracheostomy appliance was secured to the skin. A wound vac dressing was then affixed to the open skin defect of the chest. This concluded the procedure, which I performed personally.  Findings: Intra-operative atrial fibrillation successfully cardioverted  Estimated Blood Loss:  less than 50 mL         Drains: 4 chest tubes         Total IV Fluids:  Per anes  Blood Given: none          Specimens: sternal swab for gram stain and culture         Implants: sternal hardware and tracheostomy appliance.        Complications:  * No complications entered in OR log *         Disposition: ICU - intubated and critically ill.         Condition: stable  Joshua Zeringue Z. Orvan Seen, [place]

## 2019-02-09 NOTE — Progress Notes (Signed)
Pharmacy Antibiotic Note  Paula Watson is a 73 y.o. 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 Patient returned to OR on 10/27 for sternal closure. Patient has had several complications postop, now with concern for infection pharmacy has been consulted for vancomycin and cefepime dosing.  Patient was predialysis prior to procedure and is currently on CRRT d/t pressor requirements, tolerating renal replacement well. Currently afebrile, wbc bump today to 15. Blood cultures, bone biopsy culture ngtd.  Plan: Cefepime 2g q12 hours while on CRRT  Vancomycin 1g q24 hours while on CRRT Monitor length of therapy, vancomycin levels as needed, cultures  Height: 5\' 2"  (157.5 cm) Weight: 188 lb 0.8 oz (85.3 kg) IBW/kg (Calculated) : 50.1  Temp (24hrs), Avg:95.5 F (35.3 C), Min:93.4 F (34.1 C), Max:97.3 F (36.3 C)  Recent Labs  Lab 02/06/19 0416  02/07/19 0321 02/07/19 1539 01/26/2019 0232 01/22/2019 1833 02/09/19 0339  WBC 12.9*  --  19.8*  --  19.4* 12.7* 15.0*  CREATININE 1.60*   < > 1.31* 1.24* 1.26* 1.68* 1.23*  1.24*   < > = values in this interval not displayed.    Estimated Creatinine Clearance: 41 mL/min (A) (by C-G formula based on SCr of 1.24 mg/dL (H)).    Allergies  Allergen Reactions  . Penicillins Hives and Swelling    PATIENT HAS HAD A PCN REACTION WITH IMMEDIATE RASH, FACIAL/TONGUE/THROAT SWELLING, SOB, OR LIGHTHEADEDNESS WITH HYPOTENSION:  #  #  #  YES  #  #  #   Has patient had a PCN reaction causing severe rash involving mucus membranes or skin necrosis: Unknown Has patient had a PCN reaction that required hospitalization: No Has patient had a PCN reaction occurring within the last 10 years: No     Antimicrobials this admission: Cefepime 10/25> Vanc 10/25>  Microbiology results: 10/27 tissue bone biopsy: ngtd 10/24 bld x 2: ngtd 10/21 MRSA pcr neg  Thank you for allowing pharmacy to be a part of  this patient's care.  Vertis Kelch, PharmD PGY2 Cardiology Pharmacy Resident Phone [phone] 02/09/2019       1:24 PM  Please check [email or web address] for unit-specific pharmacist phone numbers

## 2019-02-09 NOTE — Progress Notes (Signed)
Back up trach care equipment is at bedside. Disposable Inner cannulas, back up trach, over the head trach sheet. RN aware

## 2019-02-09 NOTE — Progress Notes (Signed)
INO therapy has been weaned as tolerated. No apparent complications noted.

## 2019-02-09 NOTE — Progress Notes (Signed)
1 Day Post-Op Procedure(s) (LRB): STERNAL CLOSURE (N/A) TRACHEOSTOMY (N/A) Subjective: sedated  Objective: Vital signs in last 24 hours: Temp:  [93.4 F (34.1 C)-97.9 F (36.6 C)] 96.1 F (35.6 C) (10/28 0800) Pulse Rate:  [51-103] 90 (10/28 0800) Cardiac Rhythm: Normal sinus rhythm (10/28 0400) Resp:  [15-23] 18 (10/28 0800) BP: (93-136)/(38-94) 110/51 (10/28 0800) SpO2:  [93 %-100 %] 94 % (10/28 0839) Arterial Line BP: (123-146)/(42-53) 123/42 (10/28 0700) FiO2 (%):  [60 %-100 %] 90 % (10/28 0839) Weight:  [85.3 kg] 85.3 kg (10/28 0422)  Hemodynamic parameters for last 24 hours: PAP: (17-29)/(9-21) 29/9 CVP:  [6 mmHg-9 mmHg] 8 mmHg CO:  [3 L/min-3.9 L/min] 3.3 L/min CI:  [1.7 L/min/m2-2.1 L/min/m2] 1.8 L/min/m2  Intake/Output from previous day: 10/27 0701 - 10/28 0700 In: 3864.6 [I.V.:2692.1; NG/GT:40; IV Piggyback:1132.5] Out: 5870 [Chest Tube:372] Intake/Output this shift: Total I/O In: 74.9 [I.V.:74.9] Out: 248 [Other:238; Chest Tube:10]  General appearance: sedated Neurologic: sedated Heart: regular rate and rhythm, S1, S2 normal, no murmur, click, rub or gallop Lungs: clear to auscultation bilaterally Extremities: mottled RLE Wound: dressed w/vac  Lab Results: Recent Labs    01/16/2019 1833  02/09/19 0339 02/09/19 0457  WBC 12.7*  --  15.0*  --   HGB 7.9*   < > 7.8* 7.8*  HCT 24.2*   < > 24.2* 23.0*  PLT 46*  --  48*  --    < > = values in this interval not displayed.   BMET:  Recent Labs    02/05/2019 1833  02/09/19 0339 02/09/19 0457  NA 134*   < > 136  136 137  K 3.6   < > 3.7  3.7 3.7  CL 105  --  106  106  --   CO2 20*  --  22  23  --   GLUCOSE 178*  --  133*  133*  --   BUN 25*  --  18  18  --   CREATININE 1.68*  --  1.23*  1.24*  --   CALCIUM 8.4*  --  8.2*  8.2*  --    < > = values in this interval not displayed.    PT/INR:  Recent Labs    02/07/19 0321  LABPROT 15.4*  INR 1.2   ABG    Component Value Date/Time   PHART 7.383 02/09/2019 0457   HCO3 23.8 02/09/2019 0457   TCO2 25 02/09/2019 0457   ACIDBASEDEF 2.0 02/09/2019 0457   O2SAT 100.0 02/09/2019 0457   CBG (last 3)  Recent Labs    01/14/2019 2347 02/09/19 0348 02/09/19 0750  GLUCAP 179* 124* 101*    Assessment/Plan: S/P Procedure(s) (LRB): STERNAL CLOSURE (N/A) TRACHEOSTOMY (N/A) Hold sedation to assess neuro; consider head CT Keep even I/O Transfuse 2 units Prbc   LOS: 9 days    Wonda Olds 02/09/2019

## 2019-02-09 NOTE — Consult Note (Addendum)
[facility] Nurse wound consult note Patient receiving care in [place].  Patient on many types of life support measures, and has been for a number of days now.  In the recent past that included an inability to turn the patient due to spacers in an open chest wound.  This wound was inevitable.   Reason for Consult: DTPI to right buttock Wound type: DTPI Pressure Injury POA: No Measurement: 13 cm x 4 cm purple discoloration to the right of the medial gluteal fold.  The entire right buttock is discolored as well.  Dressing procedure/placement/frequency: off loading by turning is the preferred method to assist with the area.   Of note:  There is mottling of the right leg and areas on the chest.  The buttock wound and other anatomical locations may worsen as this unfortunate woman struggles for her life. Monitor the wound area(s) for worsening of condition such as: Signs/symptoms of infection,  Increase in size,  Development of or worsening of odor, Development of pain, or increased pain at the affected locations.  Notify the medical team if any of these develop.  Thank you for the consult.  Discussed plan of care with the bedside nurse.  [facility] nurse will not follow at this time.  Please re-consult the [facility] team if needed.  Val Riles, RN, MSN, CWOCN, CNS-BC, pager [phone]

## 2019-02-09 NOTE — Progress Notes (Signed)
Nutrition Follow-up  DOCUMENTATION CODES:   Obesity unspecified  INTERVENTION:   Monitor for BM- No results x 9 days (regimen in place)  Tube feeding: - Vital High Protein @ 20 ml/hr via OGT - Increase per toleration to goal rate of 60 ml/hr (1456ml) - Continue B complex with Vitamin C  At goal tube feeding regimen provides 1440 kcal, 126 grams of protein, and 1204 ml free water. Meets 105% of kcal needs and 100% of protein needs.   NUTRITION DIAGNOSIS:   Inadequate oral intake related to inability to eat as evidenced by NPO status.  Ongoing  GOAL:   Provide needs based on ASPEN/SCCM guidelines   Addressed via TF   MONITOR:   Vent status, Labs, Weight trends, Skin, I & O's  REASON FOR ASSESSMENT:   Ventilator    ASSESSMENT:   73 year old female who presented to the ED on 10/19 with chest pain and nausea. PMH of DM, CKD stage III-IV, HTN, TIA, CVA s/p right ICA stenting, anemia, GERD, HLD.   10/21 - s/p left heart cath 10/22 - s/p CABG x 3 10/23 - s/p chest exploration, start CRRT 10/27- s/p sternal closure, tracheostomy   Continues on multiple pressors- weaning slightly. On CRRT- being kept even. Remains anuric. Post pyloric 10 fr tube placed by diagnostic radiology at bedside. Per TCTS, okay to start trickles. RN made aware.   Admission weight: 81.6 kg  Current weight: 85.3  kg (down from 102.2 kg on 10/23)  Patient remains on ventilator support via trach MV: 8.8 L/min Temp (24hrs), Avg:95.5 F (35.3 C), Min:93.4 F (34.1 C), Max:97.3 F (36.3 C)    I/O: -5,495 ml since admit UOP: anuric CRRT: 5,498 ml x 24 hrs  Chest tubes: 372 ml x 24 hrs    Drips: amiodarone, dobutamine, epinephrine, levophed, vasopressin Medications: b complex with Vit C, dulcolax, aranesp, colace, SS novolog, levemir Labs: Cr 1.24- trending down Mg 2.7 (H) corrected calcium 9.1 (wdl) CBG 109-187  Diet Order:   Diet Order    None      EDUCATION NEEDS:   No education  needs have been identified at this time  Skin:  Skin Assessment: Skin Integrity Issues: Skin Integrity Issues: Wound Vac: chest Incisions: left leg  Last BM:  PTA  Height:   Ht Readings from Last 1 Encounters:  02/07/2019 5\' 2"  (1.575 m)    Weight:   Wt Readings from Last 1 Encounters:  02/09/19 85.3 kg    Ideal Body Weight:  50 kg  BMI:  Body mass index is 34.4 kg/m.  Estimated Nutritional Needs:   Kcal:  1377 kcal  Protein:  100-125 grams  Fluid:  >/= 1.2 L/day   Paula Watson RD, LDN Clinical Nutrition Pager # - [phone]

## 2019-02-09 NOTE — Progress Notes (Signed)
Subjective:  Patient seen and evaluated at bedside. She went to OR yesterday for chest closure and tracheostomy. CRRT resumed yesterday evening. Keeping her even given her hemodynamics. Remains anuric.   Objective Vital signs in last 24 hours: Vitals:   02/09/19 0500 02/09/19 0600 02/09/19 0700 02/09/19 0800  BP: (!) 128/53 (!) 110/59 (!) 106/46 (!) 110/51  Pulse: 89 88 88 90  Resp: 17 20 19 18   Temp: (!) 95 F (35 C) (!) 95.5 F (35.3 C) (!) 95.9 F (35.5 C) (!) 96.1 F (35.6 C)  TempSrc:      SpO2: 99% 100% 100% 98%  Weight:      Height:       Weight change: -2.2 kg  Intake/Output Summary (Last 24 hours) at 02/09/2019 0840 Last data filed at 02/09/2019 0800 Gross per 24 hour  Intake 3750.37 ml  Output 5817 ml  Net -2066.63 ml    Assessment/ Plan: 73 year old white female with longstanding diabetes mellitus with complications, likely diabetic nephropathy, diffuse vascular disease now presenting with unstable angina/acute coronary syndrome status post CABG on 01/24/2019.  Assessment/Plan: 1. Chronic kidney disease stage IV. Her baseline serum creatinine is in the low twos. Likely secondary to diabetic nephropathy.  2. AKI - ATN in the setting of cardiogenic shock. Remains anuric. Volume status improved with fluid removal. Weight down another 2 kg from yesterday. Given her persistent shock requiring multiple pressors, will continue CRRT to keep her net even.    3. Acute hypoxemic respiratory failure. Likely 2/2 volume overload which has improved with volume removal on CRRT. Went for tracheostomy on 10/27.   4. Cardiac/pericardial tamponade post CABG. Improved with sternal opening. Returned to OR on 10/27 for chest closure  5. Metabolic alkalosis. Likely secondary to blood product resuscitation and hypokalemia. Improved with CRRT.  6. Hypokalemia - repleting   Delice Bison    Labs: Basic Metabolic Panel: Recent Labs  Lab 01/15/2019 0232   01/30/2019 1833 02/11/2019 1842 02/09/19 0339 02/09/19 0457  NA 135   < > 134* 139 136  136 137  K 3.6   < > 3.6 3.7 3.7  3.7 3.7  CL 104  --  105  --  106  106  --   CO2 23  --  20*  --  22  23  --   GLUCOSE 187*  --  178*  --  133*  133*  --   BUN 18  --  25*  --  18  18  --   CREATININE 1.26*  --  1.68*  --  1.23*  1.24*  --   CALCIUM 7.6*  --  8.4*  --  8.2*  8.2*  --   PHOS 2.7  --  4.1  --  2.5  --    < > = values in this interval not displayed.   Liver Function Tests: Recent Labs  Lab 02/04/19 0750  02/04/19 2010  02/07/19 0321  02/04/2019 0232 02/09/2019 1833 02/09/19 0339  AST 1,763*  --  5,358*  --  976*  --   --   --   --   ALT 1,425*  --  2,916*  --  1,760*  --   --   --   --   ALKPHOS 32*  --  46  --  81  --   --   --   --   BILITOT 2.6*  --  2.6*  --  2.9*  --   --   --   --  PROT 4.2*  --  4.4*  --  4.6*  --   --   --   --   ALBUMIN 2.8*   < > 2.7*   < > 2.2*   < > 2.4* 2.4* 2.9*   < > = values in this interval not displayed.   No results for input(s): LIPASE, AMYLASE in the last 168 hours. No results for input(s): AMMONIA in the last 168 hours. CBC: Recent Labs  Lab 02/06/19 0416  02/07/19 0321 01/27/2019 0232  02/01/2019 1833 01/20/2019 1842 02/09/19 0339 02/09/19 0457  WBC 12.9*  --  19.8* 19.4*  --  12.7*  --  15.0*  --   NEUTROABS 10.9*  --  16.8*  --   --  10.2*  --   --   --   HGB 10.1*   < > 10.1* 10.4*   < > 7.9* 7.8* 7.8* 7.8*  HCT 30.6*   < > 30.5* 32.0*   < > 24.2* 23.0* 24.2* 23.0*  MCV 93.3  --  93.6 95.2  --  96.8  --  95.3  --   PLT 94*  --  89* 66*  --  46*  --  48*  --    < > = values in this interval not displayed.   Cardiac Enzymes: No results for input(s): CKTOTAL, CKMB, CKMBINDEX, TROPONINI in the last 168 hours. CBG: Recent Labs  Lab 02/05/2019 1118 02/01/2019 2038 02/12/2019 2347 02/09/19 0348 02/09/19 0750  GLUCAP 159* 146* 179* 124* 101*    Iron Studies: No results for input(s): IRON, TIBC, TRANSFERRIN, FERRITIN in the  last 72 hours. Studies/Results: Dg Chest 1 View  Result Date: 01/30/2019 CLINICAL DATA:  Postop cardiac surgery EXAM: CHEST  1 VIEW COMPARISON:  Radiograph 02/07/2019 FINDINGS: Tracheostomy tube terminates in the mid trachea with removal of the previously seen endotracheal tube. Pulmonary artery catheter terminates in the main pulmonary artery. Transesophageal tube tip and side port distal to the GE junction mediastinal and bilateral pleural drains remain in place. Abandoned epicardial pacer wires are noted. Sternal sutures remain intact and aligned. Surgical clips project over the mediastinum. There is a steep left anterior obliquity superimposing the heart of the right chest. Basilar atelectatic changes are again noted no visible pneumothorax. Likely small layering effusions. Small amount of density adjacent the right pleural drain may reflect a small amount of adjacent hemorrhage. IMPRESSION: 1. Tracheostomy tube terminates in the mid trachea. 2. Transesophageal tube tip and side port distal to the GE junction. 3. Mediastinal and pleural drains remain in place. 4. Likely small layering bilateral pleural effusions and bibasilar atelectatic changes. No visible pneumothorax. Electronically Signed   By: Lovena Le M.D.   On: 01/25/2019 18:48   Dg Chest Port 1 View  Result Date: 02/09/2019 CLINICAL DATA:  Endotracheal tube and chest tube assessment. EXAM: PORTABLE CHEST 1 VIEW COMPARISON:  01/22/2019 FINDINGS: Patient is rotated to the right. Tracheostomy tube is unchanged. Right IJ Swan-Ganz catheter has tip over the main pulmonary artery segment unchanged. Enteric tube courses into the region of the stomach and off the film as tip is not visualized. Bilateral chest tubes and mediastinal drain unchanged. Lungs are adequately inflated with minimal medial left basilar opacification likely atelectasis. Possible small amount of layering left pleural fluid. Mild stable cardiomegaly. Remainder the exam is  unchanged. IMPRESSION: Minimal left basilar atelectasis with possible small left effusion. Multiple tubes and lines unchanged. Electronically Signed   By: Marin Olp M.D.   On:  02/09/2019 08:16   Medications: Infusions: .  prismasol BGK 4/2.5 200 mL/hr at 02/09/19 0546  .  prismasol BGK 4/2.5 200 mL/hr at 02/06/19 1951  . sodium chloride    . sodium chloride    . sodium chloride 10 mL/hr at 02/01/2019 1100  . amiodarone 30 mg/hr (02/09/19 0800)  . ceFEPime (MAXIPIME) IV Stopped (02/09/19 1683)  . cisatracurium (NIMBEX) infusion 2 mcg/kg/min (01/24/2019 1300)  . DOBUTamine 2.5 mcg/kg/min (02/09/19 0800)  . epinephrine 5 mcg/min (02/09/19 0800)  . fentaNYL infusion INTRAVENOUS Stopped (02/11/2019 1847)  . insulin Stopped (02/05/19 1229)  . lactated ringers    . lactated ringers    . lactated ringers 10 mL/hr at 02/09/19 0800  . lactated ringers 10 mL/hr at 01/16/2019 2100  . midazolam Stopped (02/12/2019 1837)  . milrinone    . nitroGLYCERIN 5 mcg/min (01/19/2019 1504)  . nitroGLYCERIN    . norepinephrine (LEVOPHED) Adult infusion 12 mcg/min (02/09/19 0800)  . phenylephrine (NEO-SYNEPHRINE) Adult infusion    . potassium chloride 10 mEq (02/09/19 0819)  . prismasol BGK 2/2.5 dialysis solution 2,000 mL/hr at 01/29/2019 2044  . vancomycin Stopped (02/05/2019 2321)  . vasopressin (PITRESSIN) infusion - *FOR SHOCK* 0.04 Units/min (02/09/19 0800)    Scheduled Medications: . sodium chloride   Intravenous Once  . sodium chloride   Intravenous Once  . sodium chloride   Intravenous Once  . sodium chloride   Intravenous Once  . sodium chloride   Intravenous Once  . sodium chloride   Intravenous Once  . artificial tears  1 application Both Eyes F2B  . aspirin EC  325 mg Oral Daily   Or  . aspirin  324 mg Per Tube Daily  . B-complex with vitamin C  1 tablet Oral Daily  . bisacodyl  10 mg Oral Daily   Or  . bisacodyl  10 mg Rectal Daily  . chlorhexidine gluconate (MEDLINE KIT)  15 mL Mouth Rinse  BID  . Chlorhexidine Gluconate Cloth  6 each Topical Daily  . darbepoetin (ARANESP) injection - NON-DIALYSIS  60 mcg Subcutaneous Q Wed-1800  . docusate  200 mg Per Tube Daily  . feeding supplement (VITAL HIGH PROTEIN)  1,000 mL Per Tube Q24H  . fentaNYL (SUBLIMAZE) injection  25 mcg Intravenous Once  . insulin aspart  2-6 Units Subcutaneous Q4H  . insulin detemir  8 Units Subcutaneous Q12H  . mouth rinse  15 mL Mouth Rinse 10 times per day  . pantoprazole (PROTONIX) IV  40 mg Intravenous Q24H  . silver sulfADIAZINE   Topical BID  . sodium chloride flush  10-40 mL Intracatheter Q12H  . sodium chloride flush  3 mL Intravenous Q12H    have reviewed scheduled and prn medications.  Physical Exam: General: deeply sedated Neck: supple; no JVD Heart: RRR Lungs: CTAB Abdomen: soft, non-distended Extremities: 2+ pitting edema to sacrum  Neuro: RASS -5   02/09/2019,8:40 AM  LOS: 9 days

## 2019-02-09 NOTE — Anesthesia Postprocedure Evaluation (Signed)
Anesthesia Post Note  Patient: Paula Watson  Procedure(s) Performed: STERNAL CLOSURE (N/A Chest) TRACHEOSTOMY (N/A Neck)     Patient location during evaluation: SICU Anesthesia Type: General Level of consciousness: sedated Pain management: pain level controlled Vital Signs Assessment: post-procedure vital signs reviewed and stable Respiratory status: patient on ventilator - see flowsheet for VS (tracheostomy) Cardiovascular status: stable Postop Assessment: no apparent nausea or vomiting Anesthetic complications: no    Last Vitals:  Vitals:   02/09/19 0600 02/09/19 0700  BP: (!) 110/59 (!) 106/46  Pulse: 88 88  Resp: 20 19  Temp: (!) 35.3 C (!) 35.5 C  SpO2: 100% 100%    Last Pain:  Vitals:   02/09/19 0400  TempSrc: Core (Comment)  PainSc:                  Karyl Kinnier Ellender

## 2019-02-09 NOTE — Progress Notes (Signed)
Patient ID: Paula Watson, female   DOB: 1945-08-31, 73 y.o.   MRN: [id number] EVENING ROUNDS NOTE :     [address]       [place] [address]             [phone]                 1 Day Post-Op Procedure(s) (LRB): STERNAL CLOSURE (N/A) TRACHEOSTOMY (N/A)  Total Length of Stay:  LOS: 9 days  BP (!) 98/42   Pulse 89   Temp (!) 97.3 F (36.3 C)   Resp 20   Ht 5\' 2"  (1.575 m)   Wt 85.3 kg   SpO2 97%   BMI 34.40 kg/m   .Intake/Output      10/28 0701 - 10/29 0700   I.V. (mL/kg) 1016.5 (11.9)   Blood 578.3   Other 300   NG/GT 148.3   IV Piggyback 50.1   Total Intake(mL/kg) 2093.3 (24.5)   Emesis/NG output 250   Other 3506   Chest Tube 70   Total Output 3826   Net -1732.7         .  prismasol BGK 4/2.5 200 mL/hr at 02/09/19 0546  .  prismasol BGK 4/2.5 200 mL/hr at 02/06/19 1951  . sodium chloride    . sodium chloride    . sodium chloride 10 mL/hr at 02/07/2019 1100  . amiodarone 30 mg/hr (02/09/19 1900)  . ceFEPime (MAXIPIME) IV Stopped (02/09/19 7262)  . cisatracurium (NIMBEX) infusion 2 mcg/kg/min (01/17/2019 1300)  . DOBUTamine 2.5 mcg/kg/min (02/09/19 1900)  . epinephrine 5 mcg/min (02/09/19 1900)  . fentaNYL infusion INTRAVENOUS Stopped (01/15/2019 1847)  . lactated ringers    . lactated ringers 10 mL/hr at 02/09/19 1900  . lactated ringers 10 mL/hr at 02/09/19 1900  . lactated ringers 10 mL/hr at 01/14/2019 2100  . midazolam Stopped (01/25/2019 1837)  . milrinone    . nitroGLYCERIN 5 mcg/min (01/30/2019 1504)  . nitroGLYCERIN    . norepinephrine (LEVOPHED) Adult infusion 35 mcg/min (02/09/19 1900)  . phenylephrine (NEO-SYNEPHRINE) Adult infusion    . prismasol BGK 4/2.5 2,000 mL/hr at 02/09/19 1858  . vancomycin Stopped (01/27/2019 2321)  . vasopressin (PITRESSIN) infusion - *FOR SHOCK* Stopped (02/09/19 1600)     Lab Results  Component Value Date   WBC 15.0 (H) 02/09/2019   HGB 12.3 02/09/2019   HCT 35.9 (L) 02/09/2019   PLT 48 (L)  02/09/2019   GLUCOSE 114 (H) 02/09/2019   CHOL 127 02/01/2019   TRIG 91 02/01/2019   HDL 35 (L) 02/01/2019   LDLCALC 74 02/01/2019   ALT 1,760 (H) 02/07/2019   AST 976 (H) 02/07/2019   NA 136 02/09/2019   K 3.5 02/09/2019   CL 104 02/09/2019   CREATININE 1.07 (H) 02/09/2019   BUN 15 02/09/2019   CO2 20 (L) 02/09/2019   TSH 6.927 (H) 02/01/2019   INR 1.2 02/07/2019   HGBA1C 10.5 (H) 01/28/2019    Chest now closed oncvvh- 150 hour off  Grace Isaac MD  Beeper [phone] Office [phone] 02/09/2019 7:11 PM

## 2019-02-10 ENCOUNTER — Encounter (HOSPITAL_COMMUNITY): Payer: Self-pay | Admitting: Cardiothoracic Surgery

## 2019-02-10 ENCOUNTER — Inpatient Hospital Stay (HOSPITAL_COMMUNITY): Payer: Medicare Other

## 2019-02-10 DIAGNOSIS — I998 Other disorder of circulatory system: Secondary | ICD-10-CM

## 2019-02-10 DIAGNOSIS — Z951 Presence of aortocoronary bypass graft: Secondary | ICD-10-CM

## 2019-02-10 LAB — CBC
HCT: 37.1 % (ref 36.0–46.0)
Hemoglobin: 12.7 g/dL (ref 12.0–15.0)
MCH: 30.9 pg (ref 26.0–34.0)
MCHC: 34.2 g/dL (ref 30.0–36.0)
MCV: 90.3 fL (ref 80.0–100.0)
Platelets: 64 10*3/uL — ABNORMAL LOW (ref 150–400)
RBC: 4.11 MIL/uL (ref 3.87–5.11)
RDW: 16.6 % — ABNORMAL HIGH (ref 11.5–15.5)
WBC: 20 10*3/uL — ABNORMAL HIGH (ref 4.0–10.5)
nRBC: 3.1 % — ABNORMAL HIGH (ref 0.0–0.2)

## 2019-02-10 LAB — POCT I-STAT 7, (LYTES, BLD GAS, ICA,H+H)
Acid-base deficit: 3 mmol/L — ABNORMAL HIGH (ref 0.0–2.0)
Bicarbonate: 20.6 mmol/L (ref 20.0–28.0)
Calcium, Ion: 1.12 mmol/L — ABNORMAL LOW (ref 1.15–1.40)
HCT: 37 % (ref 36.0–46.0)
Hemoglobin: 12.6 g/dL (ref 12.0–15.0)
O2 Saturation: 95 %
Patient temperature: 36.3
Potassium: 4.1 mmol/L (ref 3.5–5.1)
Sodium: 136 mmol/L (ref 135–145)
TCO2: 22 mmol/L (ref 22–32)
pCO2 arterial: 30.1 mmHg — ABNORMAL LOW (ref 32.0–48.0)
pH, Arterial: 7.44 (ref 7.350–7.450)
pO2, Arterial: 68 mmHg — ABNORMAL LOW (ref 83.0–108.0)

## 2019-02-10 LAB — RENAL FUNCTION PANEL
Albumin: 2.6 g/dL — ABNORMAL LOW (ref 3.5–5.0)
Anion gap: 11 (ref 5–15)
BUN: 16 mg/dL (ref 8–23)
CO2: 21 mmol/L — ABNORMAL LOW (ref 22–32)
Calcium: 8 mg/dL — ABNORMAL LOW (ref 8.9–10.3)
Chloride: 103 mmol/L (ref 98–111)
Creatinine, Ser: 1.16 mg/dL — ABNORMAL HIGH (ref 0.44–1.00)
GFR calc Af Amer: 54 mL/min — ABNORMAL LOW (ref >60)
GFR calc non Af Amer: 47 mL/min — ABNORMAL LOW (ref >60)
Glucose, Bld: 169 mg/dL — ABNORMAL HIGH (ref 70–99)
Phosphorus: 2 mg/dL — ABNORMAL LOW (ref 2.5–4.6)
Potassium: 4.1 mmol/L (ref 3.5–5.1)
Sodium: 135 mmol/L (ref 135–145)

## 2019-02-10 LAB — TYPE AND SCREEN
ABO/RH(D): O POS
Antibody Screen: NEGATIVE
Unit division: 0
Unit division: 0

## 2019-02-10 LAB — BPAM RBC
Blood Product Expiration Date: 202011272359
Blood Product Expiration Date: 202011272359
ISSUE DATE / TIME: 202010281003
ISSUE DATE / TIME: 202010281156
Unit Type and Rh: 5100
Unit Type and Rh: 5100

## 2019-02-10 LAB — CULTURE, BLOOD (ROUTINE X 2)
Culture: NO GROWTH
Culture: NO GROWTH

## 2019-02-10 LAB — HEPARIN INDUCED PLATELET AB (HIT ANTIBODY): Heparin Induced Plt Ab: 0.034 OD (ref 0.000–0.400)

## 2019-02-10 LAB — COOXEMETRY PANEL
Carboxyhemoglobin: 1.1 % (ref 0.5–1.5)
Methemoglobin: 1.1 % (ref 0.0–1.5)
O2 Saturation: 53.2 %
Total hemoglobin: 12.9 g/dL (ref 12.0–16.0)
Total oxygen content: <31 mL/dL — ABNORMAL HIGH (ref 15.0–23.0)

## 2019-02-10 LAB — GLUCOSE, CAPILLARY
Glucose-Capillary: 156 mg/dL — ABNORMAL HIGH (ref 70–99)
Glucose-Capillary: 150 mg/dL — ABNORMAL HIGH (ref 70–99)
Glucose-Capillary: 146 mg/dL — ABNORMAL HIGH (ref 70–99)
Glucose-Capillary: 127 mg/dL — ABNORMAL HIGH (ref 70–99)

## 2019-02-10 LAB — MAGNESIUM: Magnesium: 2.6 mg/dL — ABNORMAL HIGH (ref 1.7–2.4)

## 2019-02-10 LAB — PATHOLOGIST SMEAR REVIEW

## 2019-02-10 MED ORDER — SODIUM CHLORIDE 0.9 % IV BOLUS
500.0000 mL | Freq: Once | INTRAVENOUS | Status: AC
  Administered 2019-02-10: 500 mL via INTRAVENOUS

## 2019-02-10 MED ORDER — VANCOMYCIN VARIABLE DOSE PER UNSTABLE RENAL FUNCTION (PHARMACIST DOSING): Status: DC

## 2019-02-10 MED ORDER — DEXTROSE 5 % IV SOLN: 500.0000 mg | INTRAVENOUS | Status: DC

## 2019-02-10 MED ORDER — SODIUM PHOSPHATES 45 MMOLE/15ML IV SOLN
20.0000 mmol | Freq: Once | INTRAVENOUS | Status: AC
  Administered 2019-02-10: 20 mmol via INTRAVENOUS
  Filled 2019-02-10: qty 6.67

## 2019-02-13 LAB — AEROBIC/ANAEROBIC CULTURE W GRAM STAIN (SURGICAL/DEEP WOUND): Culture: NO GROWTH

## 2019-02-13 NOTE — Progress Notes (Signed)
Daughter requests to have patient removed from the ventilator. RN discussed with MD. OK to remove patient from vent.  On CPAP patient is comfortable taking slow deep breaths.   RN will continue to monitor.

## 2019-02-13 NOTE — TOC Initial Note (Addendum)
Transition of Care [facility]) - Initial/Assessment Note    Patient Details  Name: Paula Watson MRN: [id number] Date of Birth: 1945/05/02  [facility] CM/SW Contact:    Midge Minium RN, BSN, NCM-BC, ACM-RN [phone] (working remotely) Phone Number: 03-08-2019, 3:26 PM  Clinical Narrative:                 Patient remains critically ill and intubated; per progress note has transitioned to comfort care. CM will continue to follow.  Expected Discharge Plan: Skilled Nursing Facility Barriers to Discharge: Continued Medical Work up   Expected Discharge Plan and Services Expected Discharge Plan: [place]      Activities of Daily Living Home Assistive Devices/Equipment: Kasandra Knudsen (specify quad or straight) ADL Screening (condition at time of admission) Patient's cognitive ability adequate to safely complete daily activities?: Yes Is the patient deaf or have difficulty hearing?: No Does the patient have difficulty seeing, even when wearing glasses/contacts?: No Does the patient have difficulty concentrating, remembering, or making decisions?: No Patient able to express need for assistance with ADLs?: Yes Does the patient have difficulty dressing or bathing?: No Independently performs ADLs?: Yes (appropriate for developmental age) Does the patient have difficulty walking or climbing stairs?: No Weakness of Legs: None Weakness of Arms/Hands: None   Admission diagnosis:  Prolonged Q-T interval on ECG [R94.31] Acute coronary syndrome ([place]) [I24.9] Chest pain [R07.9] NSTEMI (non-ST elevated myocardial infarction) [facility]) [I21.4] Patient Active Problem List   Diagnosis Date Noted  . S/P CABG x 3 March 08, 2019  . Ischemic leg 03/08/2019  . Pressure injury of skin 02/09/2019  . NSTEMI (non-ST elevated myocardial infarction) ([place]) 01/14/2019  . GERD (gastroesophageal reflux disease) 01/15/2018  . H/O adenomatous polyp of colon 01/15/2018  . Rectal bleeding 01/15/2018   . Elevated serum creatinine 12/10/2017  . Persistent proteinuria 12/10/2017  . 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 GERD with esophagitis 10/18/2017  . Chronic idiopathic constipation 06/12/2017  . Carotid stenosis, symptomatic, with infarction ([place]) 04/10/2017  . Left-sided weakness 03/11/2017  . At high risk for falls 03/11/2017  . Cerebrovascular accident (CVA) due to embolism of cerebral artery ([place]) 03/03/2017  . Palpitation 02/19/2017  . Frequent falls 02/19/2017  . Appetite loss 02/19/2017  . Nausea & vomiting 02/19/2017  . History of post poliomyelitis muscular atrophy 06/09/2016  . Well woman exam 06/09/2016  . History of stroke 06/09/2016  . Aneurysm of artery ([place]) 06/09/2016  . Sciatica of right side 02/04/2016  . Chronic right-sided low back pain with right-sided sciatica 02/04/2016  . Chronic nonseasonal allergic rhinitis due to pollen 02/04/2016  . COPD exacerbation ([place]) 02/04/2016  . TIA (transient ischemic attack) 02/19/2011  . HTN (hypertension), benign 02/19/2011  . DM (diabetes mellitus) type II controlled with renal manifestation ([place]) 02/19/2011  . Hypokalemia 02/19/2011  . Leukocytosis 02/19/2011  . UTI (lower urinary tract infection) 02/19/2011  . Tobacco abuse 02/19/2011   PCP:  Terald Sleeper, PA-C Pharmacy:   [facility] [address] [place] [address] [place] [address] Phone: [phone] Fax: [phone]     Social Determinants of Health (SDOH) Interventions    Readmission Risk Interventions Readmission Risk Prevention Plan Mar 08, 2019  Transportation Screening Complete  PCP or Specialist Appt within 3-5 Days Not Complete  Not Complete comments transitioned to comfort care  [place] or [place] Complete  Social Work Consult for [place] Planning/Counseling Complete  Palliative Care Screening Complete  Medication Review (RN  Care Manager) Complete  Some recent data might be hidden

## 2019-02-13 NOTE — Progress Notes (Signed)
2 Days Post-Op Procedure(s) (LRB): STERNAL CLOSURE (N/A) TRACHEOSTOMY (N/A) Subjective: Intubated/sedated  Objective: Vital signs in last 24 hours: Temp:  [95.9 F (35.5 C)-99.9 F (37.7 C)] 98.1 F (36.7 C) (10/29 0900) Pulse Rate:  [81-98] 88 (10/29 0900) Cardiac Rhythm: A-V Sequential paced (10/29 0800) Resp:  [17-31] 30 (10/29 0900) BP: (95-151)/(42-74) 96/74 (10/29 0900) SpO2:  [92 %-100 %] 93 % (10/29 0900) Arterial Line BP: (96-184)/(33-60) 145/50 (10/29 0900) FiO2 (%):  [40 %-60 %] 40 % (10/29 0834) Weight:  [82.9 kg] 82.9 kg (10/29 0455)  Hemodynamic parameters for last 24 hours: PAP: (25-52)/(14-40) 28/20 CVP:  [0 mmHg-9 mmHg] 7 mmHg CO:  [2.7 L/min-3.2 L/min] 2.7 L/min CI:  [1.5 L/min/m2-1.8 L/min/m2] 1.5 L/min/m2  Intake/Output from previous day: 10/28 0701 - 10/29 0700 In: 3599.9 [I.V.:1983.1; Blood:578.3; NG/GT:388.3; IV Piggyback:350.1] Out: 6834 [Emesis/NG output:250; Chest Tube:130] Intake/Output this shift: Total I/O In: 373.2 [I.V.:152; NG/GT:40; IV Piggyback:181.2] Out: 365 [Drains:50; Other:295; Chest Tube:20]  General appearance: not waking yet Neurologic: nothing more than cough Heart: regular rate and rhythm, S1, S2 normal, no murmur, click, rub or gallop Lungs: clear to auscultation bilaterally Abdomen: soft, non-tender; bowel sounds normal; no masses,  no organomegaly Extremities: RLE more dusky Wound: dressing intact  Lab Results: Recent Labs    02/09/19 0339  2019/03/06 0312 03/06/2019 0445  WBC 15.0*  --  20.0*  --   HGB 7.8*   < > 12.7 12.6  HCT 24.2*   < > 37.1 37.0  PLT 48*  --  64*  --    < > = values in this interval not displayed.   BMET:  Recent Labs    02/09/19 1500 03-06-19 0312 06-Mar-2019 0445  NA 136 135 136  K 3.5 4.1 4.1  CL 104 103  --   CO2 20* 21*  --   GLUCOSE 114* 169*  --   BUN 15 16  --   CREATININE 1.07* 1.16*  --   CALCIUM 8.1* 8.0*  --     PT/INR: No results for input(s): LABPROT, INR in the last 72  hours. ABG    Component Value Date/Time   PHART 7.440 06-Mar-2019 0445   HCO3 20.6 06-Mar-2019 0445   TCO2 22 2019/03/06 0445   ACIDBASEDEF 3.0 (H) 2019/03/06 0445   O2SAT 95.0 06-Mar-2019 0445   CBG (last 3)  Recent Labs    06-Mar-2019 0316 March 06, 2019 0451 03/06/19 0745  GLUCAP 156* 150* 146*    Assessment/Plan: S/P Procedure(s) (LRB): STERNAL CLOSURE (N/A) TRACHEOSTOMY (N/A) Head CT for prognosis; appreciate Vasc Surgery input.    LOS: 10 days    Paula Watson 2019-03-06
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
## 2019-02-13 NOTE — Progress Notes (Signed)
Patient pronounced with Susie Cassette as second Psychologist, counselling.

## 2019-02-13 NOTE — Progress Notes (Signed)
RT NOTE: RT transported patient from 2H15 to CT and back with RN. Ambu bag present and FiO2 increased to 100% for transport. No complications. VS stable. RT will continue to monitor.

## 2019-02-13 NOTE — Death Summary Note (Signed)
Physician Discharge Summary  Patient ID: Paula Watson MRN: [id number] DOB/AGE: 11-10-1945 73 y.o.  Admit date: 01/20/2019 Discharge date: 03-08-19  Admission Diagnoses:  Patient Active Problem List   Diagnosis Date Noted  . Pressure injury of skin 02/09/2019  . NSTEMI (non-ST elevated myocardial infarction) ([place]) 01/24/2019  . GERD (gastroesophageal reflux disease) 01/15/2018  . H/O adenomatous polyp of colon 01/15/2018  . Rectal bleeding 01/15/2018  . Elevated serum creatinine 12/10/2017  . Persistent proteinuria 12/10/2017  . 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 GERD with esophagitis 10/18/2017  . Chronic idiopathic constipation 06/12/2017  . Carotid stenosis, symptomatic, with infarction ([place]) 04/10/2017  . Left-sided weakness 03/11/2017  . At high risk for falls 03/11/2017  . Cerebrovascular accident (CVA) due to embolism of cerebral artery ([place]) 03/03/2017  . Palpitation 02/19/2017  . Frequent falls 02/19/2017  . Appetite loss 02/19/2017  . Nausea & vomiting 02/19/2017  . History of post poliomyelitis muscular atrophy 06/09/2016  . Well woman exam 06/09/2016  . History of stroke 06/09/2016  . Aneurysm of artery ([place]) 06/09/2016  . Sciatica of right side 02/04/2016  . Chronic right-sided low back pain with right-sided sciatica 02/04/2016  . Chronic nonseasonal allergic rhinitis due to pollen 02/04/2016  . COPD exacerbation ([place]) 02/04/2016  . TIA (transient ischemic attack) 02/19/2011  . HTN (hypertension), benign 02/19/2011  . DM (diabetes mellitus) type II controlled with renal manifestation ([place]) 02/19/2011  . Hypokalemia 02/19/2011  . Leukocytosis 02/19/2011  . UTI (lower urinary tract infection) 02/19/2011  . Tobacco abuse 02/19/2011   Discharge Diagnoses:   Patient Active Problem List   Diagnosis Date Noted  . S/P CABG x 3 03/08/2019  . Ischemic leg 2019-03-08  . Pressure injury of skin 02/09/2019  . NSTEMI (non-ST elevated myocardial infarction)  ([place]) 02/05/2019  . GERD (gastroesophageal reflux disease) 01/15/2018  . H/O adenomatous polyp of colon 01/15/2018  . Rectal bleeding 01/15/2018  . Elevated serum creatinine 12/10/2017  . Persistent proteinuria 12/10/2017  . 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 GERD with esophagitis 10/18/2017  . Chronic idiopathic constipation 06/12/2017  . Carotid stenosis, symptomatic, with infarction ([place]) 04/10/2017  . Left-sided weakness 03/11/2017  . At high risk for falls 03/11/2017  . Cerebrovascular accident (CVA) due to embolism of cerebral artery ([place]) 03/03/2017  . Palpitation 02/19/2017  . Frequent falls 02/19/2017  . Appetite loss 02/19/2017  . Nausea & vomiting 02/19/2017  . History of post poliomyelitis muscular atrophy 06/09/2016  . Well woman exam 06/09/2016  . History of stroke 06/09/2016  . Aneurysm of artery ([place]) 06/09/2016  . Sciatica of right side 02/04/2016  . Chronic right-sided low back pain with right-sided sciatica 02/04/2016  . Chronic nonseasonal allergic rhinitis due to pollen 02/04/2016  . COPD exacerbation ([place]) 02/04/2016  . TIA (transient ischemic attack) 02/19/2011  . HTN (hypertension), benign 02/19/2011  . DM (diabetes mellitus) type II controlled with renal manifestation ([place]) 02/19/2011  . Hypokalemia 02/19/2011  . Leukocytosis 02/19/2011  . UTI (lower urinary tract infection) 02/19/2011  . Tobacco abuse 02/19/2011   Discharged Condition: good  History of Present Illness:  Paula Watson is a 73 yo lady with longstanding and diffuse peripheral, cerebrovascular disease, DM, and chronic renal insufficiency.  The patient has not Watson medical staff in months due to [place].  The patient developed severe chest pain approximately 3 weeks ago. This was usually associated with eating and accompanied by nausea, vomiting.  She contacted EMS on 2 separate occasions but she declined transportation to the  ED.  She again developed similar symptoms 2 days ago that were more  severe prompting her to present to the ED.  Workup ruled patient in for NSTEMI.  She underwent cardiac catheterization which showed severe LM CAD.  The patient experienced severe chest pain during procedure.  She had an intra-aortic balloon pump placed, she was chest pain free and stable.  It was felt emergent coronary bypass grafting would be indicated and TCTS consult was requested.  Hospital Course:   She was admitted to the ICU.  She was evaluated by Paula Watson who felt patient would be a surgical candidate, but would be at increased risk of dialysis due ot her advanced renal failure.  The risks and benefits of the procedure were explained to the patient and she was agreeable to proceed.  She was taken to the operating room on 01/25/2019.  She was taken to the operating room and underwent CABG x 3 utilizing Free LIMA to LAD, SVG to PLVB, and SVG to OM.  She also underwent endoscopic harvest of greater saphenous vein from her left thigh and portion of lower leg.  She tolerated the procedure, however attempts at closing her chest were unsuccessful due to hypotension.  She was covered with a wound vac and taken to the SICU in stable, but critical condition.  The patient developed worsening bradycardia and hypotension.  She was acidotic which was corrected.  Her chest dressing was removed and no significant clot was encountered.  Her hemodynamics improved with chest opening.  She was transfused multiple units of blood cell products due to coagulopathy.  She was evaluated by Nephrology who initiated CRRT due to significant volume overload and poor urinary output. Her balloon pump was removed on 02/04/2019 without difficulty.  The patient developed elevated transaminases felt to be due to shock liver.  This was treated supportively. The advanced heart failure team was consulted for assistance in managing cardiogenic shock.  There performed bedside TEE which showed normal EF of 50-55% and hypokinesis of the Right  ventricle.  There were no valvular abnormalities.  She developed hypotension into the 60s,  She was treated with epinephrine with improvement of symptoms.  She was found to be hypothermic and was started on broad spectrum antibiotics.  Her dialysis was held and she was treated with some fluid.  She continued to require multiple inotrope and pressor support with  Epinephrine, Levophed, Vasopressin, Dobutamine, and Milrinone.  This medications were adjusted/weaned as tolerated.  She was not able to be weaned from the vent.  Her fluid status improved with CRRT.  She was able to be taken back to the operating room on 01/13/2019 for sternal closure and placement of Tracheostomy.  She developed worsening thrombocytopenia. HIT panel was obtained.  She was taken off all sedation.  She remained unresponsive.  CT of the head was obtained and showed loss of left insular ribbon sign, which was felt to be concerning for possible MCA stroke.  MRI was recommended, but due to recent surgery unable to be performed.  She developed an ischemic right leg due to underlying PVD and previous placement of balloon pump.  Vascular surgery consult was obtained who felt the patient's leg was not salvageable.  They felt the patient would require above the knee amputation with emergent revascularization to salvage her buttocks area.  Due to the patient's critical illness with dialysis, maximum cardiogenic support, possible stroke, tracheostomy and need for amputation, the family ultimately decided to make patient comfort care.  She  passed away uneventfully with family at bedside.   Consults: cardiology, nephrology and vascular surgery  Significant Diagnostic Studies: angiography:    Ost LM to Dist LM lesion is 99% stenosed.  Mid LAD lesion is 40% stenosed.  Prox Cx to Mid Cx lesion is 80% stenosed.  Prox RCA lesion is 80% stenosed.  Mid RCA lesion is 80% stenosed.  RPDA lesion is 60% stenosed.   1.  Critical left main and  significant RCA disease.  The coronary arteries are heavily calcified. 2.  Left ventricular angiography was not performed.  EF was 50% by echo. 3.  LVEDP was significantly elevated at 32 mmHg.  However, by right heart cath, pulmonary capillary wedge pressure was only 11 mmHg with PA pressure of 43/12.  Cardiac output was 5.32 with a cardiac index of 2.89.  Right heart catheterization was done after placing intra-aortic balloon pump which might have improved the hemodynamics. 4.  Successful intra-aortic balloon pump placement via the right common femoral artery.  Recommendations: I consulted Paula Watson who came down and reviewed the catheterization films.  We both agree that the patient needs urgent CABG.  She has been on clopidogrel and I ideally we should wait few days if the patient can tolerate. She had significant symptomatic improvement after placing an intra-aortic balloon pump.  However, she appears to have significant peripheral arterial disease as her right SFA is occluded and she probably has significant iliac disease.  She might not tolerate the intra-aortic balloon pump for long  if she develops right leg ischemia.  Left femoral pulse was also diminished and thus I did not feel that switching to the left femoral artery would help. Continue nitroglycerin drip.  CT Head:  1. Loss of the normal left insular ribbon sign, cannot exclude acute left MCA stroke. No acute intracranial hemorrhage. MRI brain recommended. 2. Mild chronic small vessel ischemic changes in the cerebral white matter. 3. Nonspecific bilateral mastoid effusions.  Treatments: dialysis: CRRT,   Surgery:    Coronary Artery Bypass Grafting x 3              Free Left Internal Mammary Artery to Distal Left Anterior Descending Coronary Artery; Saphenous Vein Graft to right  Posterolateral Coronary Artery; Saphenous Vein Graft to Obtuse Marginal Branch of Left Circumflex Coronary Artery; Endoscopic Vein Harvest from left  Thigh and Lower Leg; completion graft surveillance with indocyanine green fluorescence imaging (SPY); epiaortic ultrasonography; wound vac placement to the open chest  Procedure(s) and Anesthesia Type:    * STERNAL CLOSURE - General with rigid sternal reconstruction technique; application of negative pressure dressing to soft tissue    * TRACHEOSTOMY, Open - General  Disposition: Deceased   Allergies as of 02/13/19      Reactions   Penicillins Hives, Swelling   PATIENT HAS HAD A PCN REACTION WITH IMMEDIATE RASH, FACIAL/TONGUE/THROAT SWELLING, SOB, OR LIGHTHEADEDNESS WITH HYPOTENSION:  #  #  #  YES  #  #  #   Has patient had a PCN reaction causing severe rash involving mucus membranes or skin necrosis: Unknown Has patient had a PCN reaction that required hospitalization: No Has patient had a PCN reaction occurring within the last 10 years: No      Medication List    ASK your doctor about these medications   albuterol 108 (90 Base) MCG/ACT inhaler Commonly known as: VENTOLIN HFA Inhale 2 puffs into the lungs every 6 (six) hours as needed for wheezing or shortness of breath.  amLODipine 10 MG tablet Commonly known as: NORVASC Take 1 tablet by mouth once daily   aspirin 325 MG EC tablet Take 1 tablet (325 mg total) by mouth daily.   blood glucose meter kit and supplies Dispense based on patient and insurance preference.Check Blood sugar three times a day. DX E11.65   carvedilol 12.5 MG tablet Commonly known as: COREG TAKE 1 TABLET BY MOUTH TWICE DAILY WITH MEALS   clopidogrel 75 MG tablet Commonly known as: PLAVIX Take 1 tablet (75 mg total) by mouth daily.   clotrimazole-betamethasone lotion Commonly known as: LOTRISONE Apply topically 2 (two) times daily.   doxazosin 2 MG tablet Commonly known as: CARDURA Take 2 mg by mouth every evening.   furosemide 20 MG tablet Commonly known as: LASIX Take 1-2 tablets (20-40 mg total) by mouth daily.   linaclotide 145 MCG  Caps capsule Commonly known as: Linzess Take 1 capsule (145 mcg total) by mouth daily before breakfast.   losartan 50 MG tablet Commonly known as: COZAAR Take 50 mg by mouth daily.   mupirocin cream 2 % Commonly known as: Bactroban Apply 1 application topically 2 (two) times daily.   pantoprazole 20 MG tablet Commonly known as: PROTONIX Take 1 tablet (20 mg total) by mouth daily. (Needs to be Watson before next refill)   pravastatin 20 MG tablet Commonly known as: PRAVACHOL Take 1 tablet (20 mg total) by mouth daily.   Rayaldee 30 MCG Cpcr Generic drug: Calcifediol ER Take 30 mcg by mouth at bedtime.   sitaGLIPtin 50 MG tablet Commonly known as: Januvia Take 1 tablet (50 mg total) by mouth daily.        Signed: Lars Pinks PA-C

## 2019-02-13 NOTE — Progress Notes (Signed)
Patient requests to make patient comfort care. Dr. Orvan Seen at bedside. CRRT stopped, dobutamine stopped, amiodarone stopped.  Son and daughter at bedside.

## 2019-02-13 NOTE — Progress Notes (Signed)
Chaplain met Ms. Finan's tearful daughter outside of unit.  Chaplain worked with nurse to provide comfort and peace to Ms. Brabson's son and daughter before entering Ms. Snelson's room.  Chaplain decided that family having their time together and alone with mom was best, but chaplain told nurse to call her if needed.   Will follow-up as needed.

## 2019-02-13 NOTE — Progress Notes (Signed)
  I spoke with Dr. Donzetta Matters in [facility] and her right leg is unsaavageable and would require amputation at the hip and would need emergent revascularization above this level to save her buttocks area.   I discussed this with her daughter by phone and in combination with other issues (trach, ESRD, long recovery from CABG) they are not sure they want to proceed. She will talk to her family and get back to [place]. We discussed comfort care as an option. Will hold off on further interventions for now. Proceed with head CT for help with further prognostication.   Additional 40 mins CCT.   Glori Bickers, MD  9:26 AM

## 2019-02-13 NOTE — Progress Notes (Signed)
Subjective:  On vent via trach. Unresponsive despite cutting off sedation. On CVVHD at -150 cc/hr. Down another 6 lbs. Remains anuric.    Objective Vital signs in last 24 hours: Vitals:   Feb 26, 2019 0830 2019-02-26 0834 02-26-19 0845 02/26/19 0900  BP:    96/74  Pulse: 87 87 87 88  Resp: (!) 28 (!) 28 (!) 29 (!) 30  Temp: 98.2 F (36.8 C)  98.1 F (36.7 C) 98.1 F (36.7 C)  TempSrc:      SpO2: 99% 99% 95% 93%  Weight:      Height:       Weight change: -2.4 kg  Intake/Output Summary (Last 24 hours) at 02-26-2019 3546 Last data filed at 02-26-2019 0900 Gross per 24 hour  Intake 3787.25 ml  Output 6651 ml  Net -2863.44 ml    73 year old white female with longstanding diabetes mellitus with complications, likely diabetic nephropathy, diffuse vascular disease now presenting with unstable angina/acute coronary syndrome status post CABG on 01/30/2019.  Assessment/Plan: 1. Chronic kidney disease stage IV. Her baseline serum creatinine is in the low twos. Likely secondary to diabetic nephropathy.  2. AKI - ATN in the setting of cardiogenic shock.Remains anuric. Volume status has improved with CRRT. Hemodynamically worse today and appears to be volume depleted. Holding CRRT and will continue to monitor. Potassium and corrected calcium ok. Will replete phos.   3. Acute post-op respiratory failure. Remains on vent through tracheostomy. Remains unresponsive off sedation. Checking CT head to help evaluate prognosis.   4. Post-cardiotomy cardiogenic shock. Sternum closed 10/27. She is in low output heart failure. Given CVP 6, holding CRRT as above and repleting volume.   5. Metabolic alkalosis. Likely secondary to blood product resuscitation and hypokalemia. Resolved with CRRT.  Delice Bison    Labs: Basic Metabolic Panel: Recent Labs  Lab 02/09/19 5681  02/09/19 1500 02-26-2019 0312 02/26/2019 0445  NA 136  136   < > 136 135 136  K 3.7  3.7   < > 3.5 4.1 4.1  CL  106  106  --  104 103  --   CO2 22  23  --  20* 21*  --   GLUCOSE 133*  133*  --  114* 169*  --   BUN 18  18  --  15 16  --   CREATININE 1.23*  1.24*  --  1.07* 1.16*  --   CALCIUM 8.2*  8.2*  --  8.1* 8.0*  --   PHOS 2.5  --  2.4* 2.0*  --    < > = values in this interval not displayed.   Liver Function Tests: Recent Labs  Lab 02/04/19 0750  02/04/19 2010  02/07/19 0321  02/09/19 0339 02/09/19 1500 02-26-19 0312  AST 1,763*  --  5,358*  --  976*  --   --   --   --   ALT 1,425*  --  2,916*  --  1,760*  --   --   --   --   ALKPHOS 32*  --  46  --  81  --   --   --   --   BILITOT 2.6*  --  2.6*  --  2.9*  --   --   --   --   PROT 4.2*  --  4.4*  --  4.6*  --   --   --   --   ALBUMIN 2.8*   < > 2.7*   < >  2.2*   < > 2.9* 2.8* 2.6*   < > = values in this interval not displayed.   No results for input(s): LIPASE, AMYLASE in the last 168 hours. No results for input(s): AMMONIA in the last 168 hours. CBC: Recent Labs  Lab 02/06/19 0416  02/07/19 0321 02/09/2019 0232  02/04/2019 1833  02/09/19 0339  02/09/19 1500 03-10-2019 0312 2019/03/10 0445  WBC 12.9*  --  19.8* 19.4*  --  12.7*  --  15.0*  --   --  20.0*  --   NEUTROABS 10.9*  --  16.8*  --   --  10.2*  --   --   --   --   --   --   HGB 10.1*   < > 10.1* 10.4*   < > 7.9*   < > 7.8*   < > 12.3 12.7 12.6  HCT 30.6*   < > 30.5* 32.0*   < > 24.2*   < > 24.2*   < > 35.9* 37.1 37.0  MCV 93.3  --  93.6 95.2  --  96.8  --  95.3  --   --  90.3  --   PLT 94*  --  89* 66*  --  46*  --  48*  --   --  64*  --    < > = values in this interval not displayed.   Cardiac Enzymes: No results for input(s): CKTOTAL, CKMB, CKMBINDEX, TROPONINI in the last 168 hours. CBG: Recent Labs  Lab 02/09/19 2028 02/09/19 2316 2019/03/10 0316 10-Mar-2019 0451 03/10/2019 0745  GLUCAP 116* 181* 156* 150* 146*    Iron Studies: No results for input(s): IRON, TIBC, TRANSFERRIN, FERRITIN in the last 72 hours. Studies/Results: Dg Chest 1 View  Result  Date: 01/27/2019 CLINICAL DATA:  Postop cardiac surgery EXAM: CHEST  1 VIEW COMPARISON:  Radiograph 02/07/2019 FINDINGS: Tracheostomy tube terminates in the mid trachea with removal of the previously seen endotracheal tube. Pulmonary artery catheter terminates in the main pulmonary artery. Transesophageal tube tip and side port distal to the GE junction mediastinal and bilateral pleural drains remain in place. Abandoned epicardial pacer wires are noted. Sternal sutures remain intact and aligned. Surgical clips project over the mediastinum. There is a steep left anterior obliquity superimposing the heart of the right chest. Basilar atelectatic changes are again noted no visible pneumothorax. Likely small layering effusions. Small amount of density adjacent the right pleural drain may reflect a small amount of adjacent hemorrhage. IMPRESSION: 1. Tracheostomy tube terminates in the mid trachea. 2. Transesophageal tube tip and side port distal to the GE junction. 3. Mediastinal and pleural drains remain in place. 4. Likely small layering bilateral pleural effusions and bibasilar atelectatic changes. No visible pneumothorax. Electronically Signed   By: Lovena Le M.D.   On: 01/14/2019 18:48   Dg Abd 1 View  Result Date: 02/09/2019 CLINICAL DATA:  NG placement EXAM: ABDOMEN - 1 VIEW COMPARISON:  02/06/2019 FINDINGS: Enteric tube passes through the stomach in duodenum. Contrast injected filling the proximal jejunum. Tip appears to be past the ligament of Treitz. IMPRESSION: Enteric tube appears to be distal to the ligament of Treitz. Electronically Signed   By: Franchot Gallo M.D.   On: 02/09/2019 14:33   Dg Chest Port 1 View  Result Date: 02/09/2019 CLINICAL DATA:  Endotracheal tube and chest tube assessment. EXAM: PORTABLE CHEST 1 VIEW COMPARISON:  02/01/2019 FINDINGS: Patient is rotated to the right. Tracheostomy tube is unchanged. Right IJ Swan-Ganz  catheter has tip over the main pulmonary artery segment  unchanged. Enteric tube courses into the region of the stomach and off the film as tip is not visualized. Bilateral chest tubes and mediastinal drain unchanged. Lungs are adequately inflated with minimal medial left basilar opacification likely atelectasis. Possible small amount of layering left pleural fluid. Mild stable cardiomegaly. Remainder the exam is unchanged. IMPRESSION: Minimal left basilar atelectasis with possible small left effusion. Multiple tubes and lines unchanged. Electronically Signed   By: Marin Olp M.D.   On: 02/09/2019 08:16   Medications: Infusions: .  prismasol BGK 4/2.5 200 mL/hr at 02/09/19 0546  .  prismasol BGK 4/2.5 200 mL/hr at 02/06/19 1951  . sodium chloride    . sodium chloride    . sodium chloride 10 mL/hr at 01/14/2019 1100  . amiodarone 30 mg/hr (Feb 24, 2019 0900)  . ceFEPime (MAXIPIME) IV Stopped (02/09/19 2146)  . cisatracurium (NIMBEX) infusion 2 mcg/kg/min (02/07/2019 1300)  . DOBUTamine 2.5 mcg/kg/min (2019-02-24 0900)  . epinephrine 5 mcg/min (02/24/19 0900)  . fentaNYL infusion INTRAVENOUS Stopped (02/12/2019 1847)  . lactated ringers    . lactated ringers 10 mL/hr at 2019-02-24 0900  . lactated ringers 10 mL/hr at February 24, 2019 0900  . lactated ringers 10 mL/hr at 01/26/2019 2100  . midazolam Stopped (01/25/2019 1837)  . milrinone    . nitroGLYCERIN 5 mcg/min (02/07/2019 1504)  . nitroGLYCERIN    . norepinephrine (LEVOPHED) Adult infusion Stopped (02/24/2019 0840)  . phenylephrine (NEO-SYNEPHRINE) Adult infusion    . prismasol BGK 4/2.5 2,000 mL/hr at 24-Feb-2019 0730  . sodium phosphate  Dextrose 5% IVPB    . vancomycin Stopped (02/09/19 2322)  . vasopressin (PITRESSIN) infusion - *FOR SHOCK* 0.04 Units/min (02-24-2019 0900)    Scheduled Medications: . sodium chloride   Intravenous Once  . sodium chloride   Intravenous Once  . sodium chloride   Intravenous Once  . sodium chloride   Intravenous Once  . sodium chloride   Intravenous Once  . artificial tears  1  application Both Eyes T4S  . aspirin EC  325 mg Oral Daily   Or  . aspirin  324 mg Per Tube Daily  . B-complex with vitamin C  1 tablet Oral Daily  . bisacodyl  10 mg Oral Daily   Or  . bisacodyl  10 mg Rectal Daily  . chlorhexidine gluconate (MEDLINE KIT)  15 mL Mouth Rinse BID  . Chlorhexidine Gluconate Cloth  6 each Topical Daily  . darbepoetin (ARANESP) injection - NON-DIALYSIS  60 mcg Subcutaneous Q Wed-1800  . docusate  200 mg Per Tube Daily  . feeding supplement (VITAL HIGH PROTEIN)  1,000 mL Per Tube Q24H  . fentaNYL (SUBLIMAZE) injection  25 mcg Intravenous Once  . insulin aspart  2-6 Units Subcutaneous Q4H  . insulin detemir  8 Units Subcutaneous Q12H  . mouth rinse  15 mL Mouth Rinse 10 times per day  . pantoprazole (PROTONIX) IV  40 mg Intravenous Q24H  . silver sulfADIAZINE   Topical BID  . sodium chloride flush  10-40 mL Intracatheter Q12H  . sodium chloride flush  3 mL Intravenous Q12H    have reviewed scheduled and prn medications.  Physical Exam: General: unresponsive on vent  Neck: tracheostomy in place; no JVD Heart: RRR Lungs: CTAB Abdomen: soft, non-distended  Extremities: RLE appears ischemic  Neuro: unresponsive    02/24/2019,9:21 AM  LOS: 10 days

## 2019-02-13 NOTE — Progress Notes (Signed)
RT NOTE: RN asked RT to bedside to remove ventilator from patient due to patient passing. Family and RN at bedside.

## 2019-02-13 NOTE — Consult Note (Addendum)
Hospital Consult    Reason for Consult:  Right lower extremity ischemia Referring Physician:  Dr. Haroldine Laws MRN #:  [id number]  History of Present Illness: This is a 73 y.o. female currently intubated in the ICU.  She is undergone initially intra-aortic balloon pump placement via right common femoral artery approach.  This time was notable occlusion of the right SFA.  She subsequently underwent coronary artery bypass grafting.  She was taken back to the operating 2 days ago for closure of her chest.  She remains on dialysis requiring 2 pressors at this time.  She is currently not responsive plan for CT head today.  I am consulted for lower extremity ischemia that has been present for least 48 hours according to bedside physician and nursing.  All information obtained from chart and from discussing with care team at the bedside.  Past Medical History:  Diagnosis Date  . Anemia    Iron deficiency  . 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 Blood transfusion   . Bronchitis    "have had a couple of times in my lifetime"  . Cancer ([place]) 1985   cervical  . Cataract   . CKD (chronic kidney disease) stage 1, GFR 90 ml/min or greater was told by her PCP   Intolerant to ACE according to her PCP  . Diabetes mellitus without complication ([place])   . Dyspnea   . Dysrhythmia    :"SKIPS A BEAT "   SCHEDULED FOR STRESS TEST 2019  . GERD (gastroesophageal reflux disease)   . High cholesterol   . History of hiatal hernia   . History of kidney stones   . Hyperlipidemia   . Hypertension   . Poliomyelitis since 1948   has drop foot on R  . Stroke [facility]) 2007 affected R side  . Vertigo now improved    Past Surgical History:  Procedure Laterality Date  . CAROTID ENDARTERECTOMY Left 2000 L side  . CERVICAL CONIZATION W/BX  1985  . COLONOSCOPY  2010   Dr. Laural Golden: tubular adenoma.   . COLONOSCOPY N/A 03/03/2018   Procedure: COLONOSCOPY;  Surgeon: Daneil Dolin, MD;  [facility];  Service:  Endoscopy;  Laterality: N/A;  2:00pm  . CORONARY ANGIOPLASTY  stented 2007  . CORONARY ARTERY BYPASS GRAFT N/A 02/07/2019   Procedure: CORONARY ARTERY BYPASS GRAFTING (CABG) times three. Free IMA to LAD. Endoscopic harvesting of left greater saphenous vein to OM1 and Posterior Lateral (PLB).;  Surgeon: Wonda Olds, MD;  Location: [place];  Service: Open Heart Surgery;  Laterality: N/A;  . DILATION AND CURETTAGE OF UTERUS  1980's  . foot reconstructjion  1953   right foot; post polio  . IABP INSERTION Right 01/21/2019   Procedure: IABP Insertion;  Surgeon: Wellington Hampshire, MD;  Location: [place] [facility];  Service: Cardiovascular;  Laterality: Right;  . IR ANGIO EXTERNAL CAROTID SEL EXT CAROTID UNI R MOD SED  03/06/2017  . IR ANGIO INTRA EXTRACRAN SEL COM CAROTID INNOMINATE UNI R MOD SED  03/24/2017  . IR ANGIO VERTEBRAL SEL SUBCLAVIAN INNOMINATE BILAT MOD SED  03/09/2017  . IR ANGIO VERTEBRAL SEL VERTEBRAL BILAT MOD SED  03/06/2017  . IR RADIOLOGIST EVAL & MGMT  07/21/2016  . LEFT HEART CATH AND CORONARY ANGIOGRAPHY N/A 02/07/2019   Procedure: LEFT HEART CATH AND CORONARY ANGIOGRAPHY;  Surgeon: Wellington Hampshire, MD;  Location: [place] [facility];  Service: Cardiovascular;  Laterality: N/A;  RIGHT AND LEFT  . POLYPECTOMY  03/03/2018   Procedure: POLYPECTOMY;  Surgeon: Daneil Dolin, MD;  [facility];  Service: Endoscopy;;  colon  . RADIOLOGY WITH ANESTHESIA N/A 03/24/2017   Procedure: STENTING;  Surgeon: Luanne Bras, MD;  Location: [place];  Service: Radiology;  Laterality: N/A;  . TEE WITHOUT CARDIOVERSION N/A 01/23/2019   Procedure: TRANSESOPHAGEAL ECHOCARDIOGRAM (TEE);  Surgeon: Wonda Olds, MD;  Location: [place];  Service: Open Heart Surgery;  Laterality: N/A;  . TONSILLECTOMY  1954    Allergies  Allergen Reactions  . Penicillins Hives and Swelling    PATIENT HAS HAD A PCN REACTION WITH IMMEDIATE RASH, FACIAL/TONGUE/THROAT SWELLING, SOB, OR  LIGHTHEADEDNESS WITH HYPOTENSION:  #  #  #  YES  #  #  #   Has patient had a PCN reaction causing severe rash involving mucus membranes or skin necrosis: Unknown Has patient had a PCN reaction that required hospitalization: No Has patient had a PCN reaction occurring within the last 10 years: No     Prior to Admission medications   Medication Sig Start Date End Date Taking? Authorizing Provider  albuterol (PROVENTIL HFA;VENTOLIN HFA) 108 (90 Base) MCG/ACT inhaler Inhale 2 puffs into the lungs every 6 (six) hours as needed for wheezing or shortness of breath. 06/12/17  Yes Terald Sleeper, PA-C  amLODipine (NORVASC) 10 MG tablet Take 1 tablet by mouth once daily Patient taking differently: Take 10 mg by mouth daily.  08/24/18  Yes Terald Sleeper, PA-C  aspirin EC 325 MG EC tablet Take 1 tablet (325 mg total) by mouth daily. 03/09/17  Yes Rinehuls, David L, PA-C  carvedilol (COREG) 12.5 MG tablet TAKE 1 TABLET BY MOUTH TWICE DAILY WITH MEALS Patient taking differently: Take 12.5 mg by mouth 2 (two) times daily with a meal.  09/08/18  Yes Terald Sleeper, PA-C  clopidogrel (PLAVIX) 75 MG tablet Take 1 tablet (75 mg total) by mouth daily. 09/08/18  Yes Terald Sleeper, PA-C  clotrimazole-betamethasone (LOTRISONE) lotion Apply topically 2 (two) times daily. 07/28/18  Yes Terald Sleeper, PA-C  doxazosin (CARDURA) 2 MG tablet Take 2 mg by mouth every evening.  01/06/18  Yes [provider]  furosemide (LASIX) 20 MG tablet Take 1-2 tablets (20-40 mg total) by mouth daily. Patient taking differently: Take 40 mg by mouth daily.  01/07/18  Yes Terald Sleeper, PA-C  linaclotide (LINZESS) 145 MCG CAPS capsule Take 1 capsule (145 mcg total) by mouth daily before breakfast. Patient taking differently: Take 145 mcg by mouth daily as needed (for constipation.).  06/12/17  Yes Terald Sleeper, PA-C  losartan (COZAAR) 50 MG tablet Take 50 mg by mouth daily. 01/24/19  Yes [provider]  mupirocin cream  (BACTROBAN) 2 % Apply 1 application topically 2 (two) times daily. Patient taking differently: Apply 1 application topically 2 (two) times daily as needed (irritation.).  06/09/16  Yes Terald Sleeper, PA-C  pantoprazole (PROTONIX) 20 MG tablet Take 1 tablet (20 mg total) by mouth daily. (Needs to be seen before next refill) Patient taking differently: Take 20 mg by mouth daily.  07/28/18  Yes Terald Sleeper, PA-C  pravastatin (PRAVACHOL) 20 MG tablet Take 1 tablet (20 mg total) by mouth daily. 07/26/18  Yes Particia Nearing S, PA-C  RAYALDEE 30 MCG CPCR Take 30 mcg by mouth at bedtime.   Yes [provider]  blood glucose meter kit and supplies Dispense based on patient and insurance preference.Check Blood sugar three times a day.  DX E11.65 01/30/2019   Terald Sleeper, PA-C  sitaGLIPtin (JANUVIA) 50 MG tablet Take 1 tablet (50 mg total) by mouth daily. 01/19/2019   Terald Sleeper, PA-C    Social History   Socioeconomic History  . Marital status: Divorced    Spouse name: Not on file  . Number of children: 2  . Years of education: 66  . Highest education level: 12th grade  Occupational History  . 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 Financial resource strain: Somewhat hard  . Food insecurity    Worry: Never true    Inability: Never true  . Transportation needs    Medical: No    Non-medical: No  Tobacco Use  . Smoking status: Former Smoker    Packs/day: 0.25    Years: 50.00    Pack years: 12.50    Types: Cigarettes    Quit date: 03/03/2017    Years since quitting: 1.9  . Smokeless tobacco: Never Used  Substance and Sexual Activity  . Alcohol use: No  . Drug use: No  . Sexual activity: Not Currently    Birth control/protection: Post-menopausal  Lifestyle  . Physical activity    Days per week: 0 days    Minutes per session: 0 min  . Stress: Rather much  Relationships  . Social connections    Talks on phone: More than  three times a week    Gets together: More than three times a week    Attends religious service: Never    Active member of club or organization: No    Attends meetings of clubs or organizations: Never    Relationship status: Divorced  . Intimate partner violence    Fear of current or ex partner: No    Emotionally abused: No    Physically abused: No    Forced sexual activity: No  Other Topics Concern  . Not on file  Social History Narrative  . Not on file    Family History  Problem Relation Age of Onset  . Coronary artery disease Mother   . Heart disease Mother   . Stroke Mother   . Diabetes type II Father   . Cancer Father        brain stem  . Brain cancer Father   . Diabetes Father   . Cancer Sister        breast with recurrance  . Diabetes Sister   . Stroke Sister   . Breast cancer Sister   . Coronary artery disease Brother   . Healthy Daughter   . COPD Maternal Grandmother        breast  . Breast cancer Maternal Grandmother   . COPD Paternal Grandmother        breast  . Breast cancer Paternal Grandmother   . Obesity Sister   . Heart disease Brother   . Heart attack Brother   . Lung cancer Brother   . COPD Other   . Breast cancer Other   . Stroke Paternal Grandfather   . Drug abuse Son   . Alcohol abuse Son   . Colon cancer Other        passed from colon cancer    ROS: unable to obtain secondary to patient condition  Physical Examination  Vitals:   03/11/19 0845 03/11/19 0900  BP:  96/74  Pulse: 87 88  Resp: (!) 29 (!) 30  Temp: 98.1 F (36.7 C)  98.1 F (36.7 C)  SpO2: 95% 93%   Body mass index is 33.43 kg/m.  General: Patient is intubated Cardiac: I cannot reliably feel femoral pulses on either side.  There is strong anterior tibial signal on the left.  There are no signals in the right foot or ankle area either arterial or venous. Abdomen: Soft and nondistended Extremities: Left lower extremity appears well-perfused Right lower extremity  has mottling from the thigh down.  There is blistering on the medial thigh.  She is sloughed the skin of her right foot on the dorsum of the foot. Neurologic: Unresponsive   CBC    Component Value Date/Time   WBC 20.0 (H) March 03, 2019 0312   RBC 4.11 2019-03-03 0312   HGB 12.6 2019/03/03 0445   HGB 11.0 (L) 01/21/2019 1028   HCT 37.0 03-03-2019 0445   HCT 32.9 (L) 02/06/2019 1028   PLT 64 (L) 2019/03/03 0312   PLT 287 02/07/2019 1028   MCV 90.3 03/03/2019 0312   MCV 86 01/25/2019 1028   MCH 30.9 03-03-19 0312   MCHC 34.2 03/03/19 0312   RDW 16.6 (H) 03/03/2019 0312   RDW 12.4 01/15/2019 1028   LYMPHSABS 2.3 01/15/2019 1833   LYMPHSABS 2.2 01/26/2019 1028   MONOABS 0.3 01/17/2019 1833   EOSABS 0.0 02/07/2019 1833   EOSABS 0.3 01/17/2019 1028   BASOSABS 0.0 01/22/2019 1833   BASOSABS 0.1 01/15/2019 1028    BMET    Component Value Date/Time   NA 136 2019/03/03 0445   NA 139 02/06/2019 1028   K 4.1 March 03, 2019 0445   CL 103 03-03-2019 0312   CO2 21 (L) 2019-03-03 0312   GLUCOSE 169 (H) 03-Mar-2019 0312   BUN 16 March 03, 2019 0312   BUN 29 (H) 01/30/2019 1028   CREATININE 1.16 (H) March 03, 2019 0312   CALCIUM 8.0 (L) 03-Mar-2019 0312   GFRNONAA 47 (L) 2019/03/03 0312   GFRAA 54 (L) 2019/03/03 0312    COAGS: Lab Results  Component Value Date   INR 1.2 02/07/2019   INR 1.5 (H) 02/06/2019   INR 1.6 (H) 02/05/2019     Non-Invasive Vascular Imaging:  None performed   ASSESSMENT/PLAN: This is a 73 y.o. female has undergone intra-aortic balloon pump placement from right common femoral approach and subsequent urgent coronary artery bypass grafting with delayed closure of her chest.  Now has ischemia of the right lower extremity with mottling from the thigh down no venous arterial signals at the foot.  Patient would need likely inflow procedure to provide blood to the buttock and groin area which may require iliac artery stenting and common femoral endarterectomy based on  previous angiography.  She would also need right above-knee amputation at this time as I do not think that her leg is viable.  She is to undergo a CT scan of her head to evaluate for bleed and will discuss with family how to proceed.    Aleese Kamps C. Donzetta Matters, MD [facility] Office: [phone] Pager: [phone]  Addendum: patient has transitioned to comfort care, will not proceed with surgical intervention of right lower extremity. Vascular surgery will be available as needed.   Servando Snare

## 2019-02-13 NOTE — Progress Notes (Signed)
150 mL of fentanyl and 30 mL versed wasted in sink with Laqueta Jean RN as second witness.

## 2019-02-13 NOTE — Progress Notes (Signed)
Advanced Heart Failure Rounding Note   Subjective:    Sternum closed and trach placed on 10/27. On vent via trach. Not responding.   On CVVHD at 200  Weight down another 6 pounds Now below baseline weight   Epi at 5. NE down to 8. On dobutamine at 2.5 and VP   Swan #s CVP 7 PA 26/17 Thermo 2.7/1.5 Co-ox 53% (?)   Objective:   Weight Range:  Vital Signs:   Temp:  [95.9 F (35.5 C)-99.9 F (37.7 C)] 97.9 F (36.6 C) (10/29 0700) Pulse Rate:  [81-97] 97 (10/29 0700) Resp:  [17-30] 23 (10/29 0700) BP: (98-151)/(42-71) 138/64 (10/29 0700) SpO2:  [92 %-100 %] 98 % (10/29 0700) Arterial Line BP: (96-184)/(33-60) 166/60 (10/29 0700) FiO2 (%):  [60 %-90 %] 60 % (10/29 0432) Weight:  [82.9 kg] 82.9 kg (10/29 0455) Last BM Date: 02/09/2019  Weight change: Filed Weights   01/17/2019 0500 02/09/19 0422 2019-03-02 0455  Weight: 87.5 kg 85.3 kg 82.9 kg    Intake/Output:   Intake/Output Summary (Last 24 hours) at 03/02/19 0813 Last data filed at 03/02/19 0800 Gross per 24 hour  Intake 3600.21 ml  Output 6389 ml  Net -2788.79 ml     Physical Exam: General:  Unresponsive on vent on vent HEENT: normal  Neck: supple. + trach RIJ swan. Carotids 2+ bilat; no bruits. No lymphadenopathy or thryomegaly appreciated. Cor: wound vac in place. Left breast mottled Lungs: clear Abdomen: obese soft, nontender, nondistended. No hepatosplenomegaly. No bruits or masses. Good bowel sounds. Extremities: no cyanosis, clubbing, rash, edema  RLE diffusely mottled and ischemic Neuro: unresponsive on vent    Telemetry: SR 90s personally reviewed.   .  Labs: Basic Metabolic Panel: Recent Labs  Lab 02/06/19 0416  02/07/19 0321  01/24/2019 2595  01/22/2019 1833  02/09/19 6387 02/09/19 0457 02/09/19 0906 02/09/19 1500 Mar 02, 2019 0312 2019/03/02 0445  NA 138   < > 139   < > 135   < > 134*   < > 136  136 137 137 136 135 136  K 4.7   < > 3.6   < > 3.6   < > 3.6   < > 3.7  3.7 3.7 4.0  3.5 4.1 4.1  CL 105   < > 106   < > 104  --  105  --  106  106  --   --  104 103  --   CO2 23   < > 23   < > 23  --  20*  --  22  23  --   --  20* 21*  --   GLUCOSE 215*   < > 151*   < > 187*  --  178*  --  133*  133*  --   --  114* 169*  --   BUN 16   < > 16   < > 18  --  25*  --  18  18  --   --  15 16  --   CREATININE 1.60*   < > 1.31*   < > 1.26*  --  1.68*  --  1.23*  1.24*  --   --  1.07* 1.16*  --   CALCIUM 7.5*   < > 7.3*   < > 7.6*  --  8.4*  --  8.2*  8.2*  --   --  8.1* 8.0*  --   MG 2.3  --  2.4  --  2.5*  --   --   --  2.7*  --   --   --  2.6*  --   PHOS 4.3   < > 3.0   < > 2.7  --  4.1  --  2.5  --   --  2.4* 2.0*  --    < > = values in this interval not displayed.    Liver Function Tests: Recent Labs  Lab 02/04/19 0750  02/04/19 2010  02/07/19 0321  01/19/2019 0232 02/01/2019 1833 02/09/19 0339 02/09/19 1500 February 28, 2019 0312  AST 1,763*  --  5,358*  --  976*  --   --   --   --   --   --   ALT 1,425*  --  2,916*  --  1,760*  --   --   --   --   --   --   ALKPHOS 32*  --  46  --  81  --   --   --   --   --   --   BILITOT 2.6*  --  2.6*  --  2.9*  --   --   --   --   --   --   PROT 4.2*  --  4.4*  --  4.6*  --   --   --   --   --   --   ALBUMIN 2.8*   < > 2.7*   < > 2.2*   < > 2.4* 2.4* 2.9* 2.8* 2.6*   < > = values in this interval not displayed.   No results for input(s): LIPASE, AMYLASE in the last 168 hours. No results for input(s): AMMONIA in the last 168 hours.  CBC: Recent Labs  Lab 02/04/19 0616  02/04/19 1640  02/06/19 0416  02/07/19 0321 01/30/2019 0232  01/16/2019 1833  02/09/19 0339 02/09/19 0457 02/09/19 0906 02/09/19 1500 02-28-19 0312 Feb 28, 2019 0445  WBC 7.3  --  10.8*   < > 12.9*  --  19.8* 19.4*  --  12.7*  --  15.0*  --   --   --  20.0*  --   NEUTROABS 5.6  --  7.3  --  10.9*  --  16.8*  --   --  10.2*  --   --   --   --   --   --   --   HGB 9.8*   < > 8.6*   < > 10.1*   < > 10.1* 10.4*   < > 7.9*   < > 7.8* 7.8* 7.8* 12.3 12.7 12.6  HCT  28.9*   < > 24.1*   < > 30.6*   < > 30.5* 32.0*   < > 24.2*   < > 24.2* 23.0* 23.0* 35.9* 37.1 37.0  MCV 91.5  --  87.0   < > 93.3  --  93.6 95.2  --  96.8  --  95.3  --   --   --  90.3  --   PLT 99*  --  119*   < > 94*  --  89* 66*  --  46*  --  48*  --   --   --  64*  --    < > = values in this interval not displayed.    Cardiac Enzymes: No results for input(s): CKTOTAL, CKMB, CKMBINDEX, TROPONINI in the last 168 hours.  BNP: BNP (last 3 results) No results for input(s): BNP in the  last 8760 hours.  ProBNP (last 3 results) No results for input(s): PROBNP in the last 8760 hours.    Other results:  Imaging: Dg Chest 1 View  Result Date: 02/07/2019 CLINICAL DATA:  Postop cardiac surgery EXAM: CHEST  1 VIEW COMPARISON:  Radiograph 02/07/2019 FINDINGS: Tracheostomy tube terminates in the mid trachea with removal of the previously seen endotracheal tube. Pulmonary artery catheter terminates in the main pulmonary artery. Transesophageal tube tip and side port distal to the GE junction mediastinal and bilateral pleural drains remain in place. Abandoned epicardial pacer wires are noted. Sternal sutures remain intact and aligned. Surgical clips project over the mediastinum. There is a steep left anterior obliquity superimposing the heart of the right chest. Basilar atelectatic changes are again noted no visible pneumothorax. Likely small layering effusions. Small amount of density adjacent the right pleural drain may reflect a small amount of adjacent hemorrhage. IMPRESSION: 1. Tracheostomy tube terminates in the mid trachea. 2. Transesophageal tube tip and side port distal to the GE junction. 3. Mediastinal and pleural drains remain in place. 4. Likely small layering bilateral pleural effusions and bibasilar atelectatic changes. No visible pneumothorax. Electronically Signed   By: Lovena Le M.D.   On: 01/24/2019 18:48   Dg Abd 1 View  Result Date: 02/09/2019 CLINICAL DATA:  NG placement  EXAM: ABDOMEN - 1 VIEW COMPARISON:  02/06/2019 FINDINGS: Enteric tube passes through the stomach in duodenum. Contrast injected filling the proximal jejunum. Tip appears to be past the ligament of Treitz. IMPRESSION: Enteric tube appears to be distal to the ligament of Treitz. Electronically Signed   By: Franchot Gallo M.D.   On: 02/09/2019 14:33   Dg Chest Port 1 View  Result Date: 02/09/2019 CLINICAL DATA:  Endotracheal tube and chest tube assessment. EXAM: PORTABLE CHEST 1 VIEW COMPARISON:  01/29/2019 FINDINGS: Patient is rotated to the right. Tracheostomy tube is unchanged. Right IJ Swan-Ganz catheter has tip over the main pulmonary artery segment unchanged. Enteric tube courses into the region of the stomach and off the film as tip is not visualized. Bilateral chest tubes and mediastinal drain unchanged. Lungs are adequately inflated with minimal medial left basilar opacification likely atelectasis. Possible small amount of layering left pleural fluid. Mild stable cardiomegaly. Remainder the exam is unchanged. IMPRESSION: Minimal left basilar atelectasis with possible small left effusion. Multiple tubes and lines unchanged. Electronically Signed   By: Marin Olp M.D.   On: 02/09/2019 08:16     Medications:     Scheduled Medications: . sodium chloride   Intravenous Once  . sodium chloride   Intravenous Once  . sodium chloride   Intravenous Once  . sodium chloride   Intravenous Once  . sodium chloride   Intravenous Once  . artificial tears  1 application Both Eyes P7H  . aspirin EC  325 mg Oral Daily   Or  . aspirin  324 mg Per Tube Daily  . B-complex with vitamin C  1 tablet Oral Daily  . bisacodyl  10 mg Oral Daily   Or  . bisacodyl  10 mg Rectal Daily  . chlorhexidine gluconate (MEDLINE KIT)  15 mL Mouth Rinse BID  . Chlorhexidine Gluconate Cloth  6 each Topical Daily  . darbepoetin (ARANESP) injection - NON-DIALYSIS  60 mcg Subcutaneous Q Wed-1800  . docusate  200 mg Per Tube  Daily  . feeding supplement (VITAL HIGH PROTEIN)  1,000 mL Per Tube Q24H  . fentaNYL (SUBLIMAZE) injection  25 mcg Intravenous Once  .  insulin aspart  2-6 Units Subcutaneous Q4H  . insulin detemir  8 Units Subcutaneous Q12H  . mouth rinse  15 mL Mouth Rinse 10 times per day  . pantoprazole (PROTONIX) IV  40 mg Intravenous Q24H  . silver sulfADIAZINE   Topical BID  . sodium chloride flush  10-40 mL Intracatheter Q12H  . sodium chloride flush  3 mL Intravenous Q12H    Infusions: .  prismasol BGK 4/2.5 200 mL/hr at 02/09/19 0546  .  prismasol BGK 4/2.5 200 mL/hr at 02/06/19 1951  . sodium chloride    . sodium chloride    . sodium chloride 10 mL/hr at 01/22/2019 1100  . amiodarone 30 mg/hr (Feb 27, 2019 0700)  . ceFEPime (MAXIPIME) IV Stopped (02/09/19 2146)  . cisatracurium (NIMBEX) infusion 2 mcg/kg/min (01/18/2019 1300)  . DOBUTamine 2.5 mcg/kg/min (Feb 27, 2019 0700)  . epinephrine 5 mcg/min (02/27/19 0700)  . fentaNYL infusion INTRAVENOUS Stopped (01/30/2019 1847)  . lactated ringers    . lactated ringers 10 mL/hr at Feb 27, 2019 0700  . lactated ringers 10 mL/hr at 02/27/2019 0700  . lactated ringers 10 mL/hr at 01/25/2019 2100  . midazolam Stopped (01/22/2019 1837)  . milrinone    . nitroGLYCERIN 5 mcg/min (01/27/2019 1504)  . nitroGLYCERIN    . norepinephrine (LEVOPHED) Adult infusion 8 mcg/min (2019/02/27 0700)  . phenylephrine (NEO-SYNEPHRINE) Adult infusion    . prismasol BGK 4/2.5 2,000 mL/hr at February 27, 2019 0730  . vancomycin Stopped (02/09/19 2322)  . vasopressin (PITRESSIN) infusion - *FOR SHOCK* 0.04 Units/min (02/27/19 0700)    PRN Medications: sodium chloride, fentaNYL, heparin, lactated ringers, metoprolol tartrate, midazolam, morphine injection, ondansetron (ZOFRAN) IV, oxyCODONE, sodium chloride, sodium chloride flush, sodium chloride flush   Assessment/Plan:    1. Post-cardiotomy cardiogenic shock  -  intra-op TEE 50-55%.  - sternum closed and trach placed 10/2 - hemodynamics worse  today. Now with low output despite normal LV function.  - CVP 6. Suspect she may be dry.  Will stop CVVHD. Give back 500cc fluid  - TEE 50-55% RV mod HK with mild external compression.    2. Acute post-op respiratory failure - on vent through trach. Wean as tolerated.  - remains unresponsive off sedation x 36 hours - check CT brain  3. CAD with NSTEMI - cath with 99% distal LM -  s/p CABG 10/22 - continue ASA/statin - no b-blocker with shock  4. AKI - due to shock/ATN - Anuric but probably dry today. Hold CVVHD - Remains anuric  5. Shock liver - transaminases way up due to ischemic injury  - continue supportive care.  -Trending down.   6. Coagulopathy and ABL anemia - in setting of DAPT - replace products as needed - bleeding seems to have slowed. Hgb 10.1. PLTs 125-> 94>89>66k> 48K. > 64k  - HIT pending. If screen positive would change to argatroban pending SRA confirmation Discussed dosing with PharmD personally.  7. ID  - Blood Cx 10/24 no growth  - on broad spectrum abx  8. Ischemic RLE - will ask [facility] to see. Doubt much to do at this point. If survives will need AKA  D/w Dr. Orvan Seen at bedside.   CRITICAL CARE Performed by: Glori Bickers  Total critical care time: 45 minutes  Critical care time was exclusive of separately billable procedures and treating other patients.  Critical care was necessary to treat or prevent imminent or life-threatening deterioration.  Critical care was time spent personally by me (independent of midlevel providers or residents) on the following activities:  development of treatment plan with patient and/or surrogate as well as nursing, discussions with consultants, evaluation of patient's response to treatment, examination of patient, obtaining history from patient or surrogate, ordering and performing treatments and interventions, ordering and review of laboratory studies, ordering and review of radiographic studies, pulse  oximetry and re-evaluation of patient's condition.   Length of Stay: 10   Glori Bickers MD 23-Feb-2019, 8:11 AM  Advanced Heart Failure Team Pager [phone] (M-F; [place] [facility])  Please contact [facility] for night-coverage after hours (4p -7a ) and weekends on [email or web address]

## 2019-02-13 NOTE — Progress Notes (Signed)
Nutrition Brief Note  Chart reviewed. Pt now transitioning to comfort care.  No further nutrition interventions warranted at this time.  Please re-consult as needed.   Ashwika Freels RD, LDN Clinical Nutrition Pager # - [phone]    

## 2019-02-13 DEATH — deceased
